# Patient Record
Sex: Male | Born: 1935 | Race: White | Hispanic: No | Smoking: Never smoker
Health system: Southern US, Community
[De-identification: ages and names within clinical notes are randomized; demographics above are authoritative.]

## PROBLEM LIST (undated history)

## (undated) DIAGNOSIS — I2510 Atherosclerotic heart disease of native coronary artery without angina pectoris: Secondary | ICD-10-CM

## (undated) DIAGNOSIS — N400 Enlarged prostate without lower urinary tract symptoms: Secondary | ICD-10-CM

## (undated) DIAGNOSIS — B9620 Unspecified Escherichia coli [E. coli] as the cause of diseases classified elsewhere: Secondary | ICD-10-CM

## (undated) DIAGNOSIS — G454 Transient global amnesia: Secondary | ICD-10-CM

## (undated) DIAGNOSIS — R0600 Dyspnea, unspecified: Secondary | ICD-10-CM

## (undated) DIAGNOSIS — E785 Hyperlipidemia, unspecified: Secondary | ICD-10-CM

## (undated) DIAGNOSIS — R972 Elevated prostate specific antigen [PSA]: Secondary | ICD-10-CM

## (undated) DIAGNOSIS — H9192 Unspecified hearing loss, left ear: Secondary | ICD-10-CM

## (undated) DIAGNOSIS — Z8719 Personal history of other diseases of the digestive system: Secondary | ICD-10-CM

## (undated) DIAGNOSIS — R7881 Bacteremia: Secondary | ICD-10-CM

## (undated) DIAGNOSIS — I10 Essential (primary) hypertension: Secondary | ICD-10-CM

## (undated) DIAGNOSIS — Z8639 Personal history of other endocrine, nutritional and metabolic disease: Secondary | ICD-10-CM

## (undated) DIAGNOSIS — N289 Disorder of kidney and ureter, unspecified: Secondary | ICD-10-CM

## (undated) DIAGNOSIS — N12 Tubulo-interstitial nephritis, not specified as acute or chronic: Secondary | ICD-10-CM

## (undated) DIAGNOSIS — Z8673 Personal history of transient ischemic attack (TIA), and cerebral infarction without residual deficits: Secondary | ICD-10-CM

## (undated) HISTORY — DX: Transient global amnesia: G45.4

## (undated) HISTORY — DX: Tubulo-interstitial nephritis, not specified as acute or chronic: N12

## (undated) HISTORY — DX: Elevated prostate specific antigen (PSA): R97.20

## (undated) HISTORY — PX: COLONOSCOPY: SHX174

## (undated) HISTORY — DX: Unspecified hearing loss, left ear: H91.92

## (undated) HISTORY — DX: Hyperlipidemia, unspecified: E78.5

## (undated) HISTORY — DX: Personal history of other endocrine, nutritional and metabolic disease: Z86.39

## (undated) HISTORY — PX: CHOLECYSTECTOMY: SHX55

## (undated) HISTORY — PX: STAPEDES SURGERY: SHX789

## (undated) HISTORY — DX: Essential (primary) hypertension: I10

## (undated) HISTORY — DX: Personal history of transient ischemic attack (TIA), and cerebral infarction without residual deficits: Z86.73

## (undated) HISTORY — DX: Benign prostatic hyperplasia without lower urinary tract symptoms: N40.0

## (undated) HISTORY — DX: Atherosclerotic heart disease of native coronary artery without angina pectoris: I25.10

## (undated) HISTORY — PX: OTHER SURGICAL HISTORY: SHX169

## (undated) HISTORY — DX: Unspecified Escherichia coli (E. coli) as the cause of diseases classified elsewhere: B96.20

## (undated) HISTORY — PX: TRANSURETHRAL RESECTION OF PROSTATE: SHX73

## (undated) HISTORY — DX: Disorder of kidney and ureter, unspecified: N28.9

## (undated) HISTORY — DX: Bacteremia: R78.81

## (undated) HISTORY — DX: Personal history of other diseases of the digestive system: Z87.19

---

## 2004-02-23 DIAGNOSIS — Z8639 Personal history of other endocrine, nutritional and metabolic disease: Secondary | ICD-10-CM

## 2004-02-23 HISTORY — DX: Personal history of other endocrine, nutritional and metabolic disease: Z86.39

## 2004-02-26 ENCOUNTER — Inpatient Hospital Stay (HOSPITAL_COMMUNITY): Admission: EM | Admit: 2004-02-26 | Discharge: 2004-02-27 | Payer: Self-pay | Admitting: Emergency Medicine

## 2004-11-30 ENCOUNTER — Encounter (HOSPITAL_COMMUNITY): Admission: RE | Admit: 2004-11-30 | Discharge: 2005-02-28 | Payer: Self-pay | Admitting: Endocrinology

## 2005-03-11 ENCOUNTER — Ambulatory Visit: Payer: Self-pay | Admitting: Gastroenterology

## 2005-03-25 ENCOUNTER — Ambulatory Visit: Payer: Self-pay | Admitting: Gastroenterology

## 2006-06-09 ENCOUNTER — Ambulatory Visit: Payer: Self-pay | Admitting: Internal Medicine

## 2006-06-17 ENCOUNTER — Ambulatory Visit: Payer: Self-pay

## 2006-10-13 ENCOUNTER — Encounter: Payer: Self-pay | Admitting: Internal Medicine

## 2006-10-13 DIAGNOSIS — Z8719 Personal history of other diseases of the digestive system: Secondary | ICD-10-CM | POA: Insufficient documentation

## 2006-10-13 DIAGNOSIS — D649 Anemia, unspecified: Secondary | ICD-10-CM | POA: Insufficient documentation

## 2006-10-13 DIAGNOSIS — I10 Essential (primary) hypertension: Secondary | ICD-10-CM

## 2006-10-13 DIAGNOSIS — I129 Hypertensive chronic kidney disease with stage 1 through stage 4 chronic kidney disease, or unspecified chronic kidney disease: Secondary | ICD-10-CM | POA: Insufficient documentation

## 2006-10-13 DIAGNOSIS — G459 Transient cerebral ischemic attack, unspecified: Secondary | ICD-10-CM

## 2006-10-13 DIAGNOSIS — K5732 Diverticulitis of large intestine without perforation or abscess without bleeding: Secondary | ICD-10-CM

## 2006-10-13 HISTORY — DX: Personal history of other diseases of the digestive system: Z87.19

## 2006-10-13 HISTORY — DX: Transient cerebral ischemic attack, unspecified: G45.9

## 2006-10-13 HISTORY — DX: Diverticulitis of large intestine without perforation or abscess without bleeding: K57.32

## 2007-03-03 ENCOUNTER — Ambulatory Visit: Payer: Self-pay | Admitting: Internal Medicine

## 2007-03-03 DIAGNOSIS — E039 Hypothyroidism, unspecified: Secondary | ICD-10-CM

## 2007-03-03 DIAGNOSIS — R413 Other amnesia: Secondary | ICD-10-CM | POA: Insufficient documentation

## 2007-03-03 HISTORY — DX: Other amnesia: R41.3

## 2007-03-03 HISTORY — DX: Hypothyroidism, unspecified: E03.9

## 2007-03-29 ENCOUNTER — Ambulatory Visit: Payer: Self-pay | Admitting: Internal Medicine

## 2007-03-29 DIAGNOSIS — R1011 Right upper quadrant pain: Secondary | ICD-10-CM

## 2007-03-29 LAB — CONVERTED CEMR LAB
ALT: 25 units/L (ref 0–53)
AST: 19 units/L (ref 0–37)
Albumin: 3.5 g/dL (ref 3.5–5.2)
Alkaline Phosphatase: 70 units/L (ref 39–117)
Amylase: 148 units/L — ABNORMAL HIGH (ref 27–131)
BUN: 18 mg/dL (ref 6–23)
Basophils Absolute: 0 10*3/uL (ref 0.0–0.1)
Basophils Relative: 0 % (ref 0.0–1.0)
Bilirubin, Direct: 0.2 mg/dL (ref 0.0–0.3)
CO2: 32 meq/L (ref 19–32)
Calcium: 10 mg/dL (ref 8.4–10.5)
Chloride: 104 meq/L (ref 96–112)
Creatinine, Ser: 1.4 mg/dL (ref 0.4–1.5)
Eosinophils Absolute: 0.4 10*3/uL (ref 0.0–0.6)
Eosinophils Relative: 3.5 % (ref 0.0–5.0)
GFR calc Af Amer: 65 mL/min
GFR calc non Af Amer: 53 mL/min
Glucose, Bld: 84 mg/dL (ref 70–99)
H Pylori IgG: NEGATIVE
HCT: 45.3 % (ref 39.0–52.0)
Hemoglobin: 15.3 g/dL (ref 13.0–17.0)
Lipase: 58 units/L (ref 11.0–59.0)
Lymphocytes Relative: 18.5 % (ref 12.0–46.0)
MCHC: 33.9 g/dL (ref 30.0–36.0)
MCV: 85.6 fL (ref 78.0–100.0)
Monocytes Absolute: 1.1 10*3/uL — ABNORMAL HIGH (ref 0.2–0.7)
Monocytes Relative: 9.5 % (ref 3.0–11.0)
Neutro Abs: 7.6 10*3/uL (ref 1.4–7.7)
Neutrophils Relative %: 68.5 % (ref 43.0–77.0)
Platelets: 242 10*3/uL (ref 150–400)
Potassium: 4.4 meq/L (ref 3.5–5.1)
RBC: 5.29 M/uL (ref 4.22–5.81)
RDW: 12.1 % (ref 11.5–14.6)
Sodium: 143 meq/L (ref 135–145)
Total Bilirubin: 1 mg/dL (ref 0.3–1.2)
Total Protein: 7.2 g/dL (ref 6.0–8.3)
WBC: 11.2 10*3/uL — ABNORMAL HIGH (ref 4.5–10.5)

## 2007-03-29 LAB — HM COLONOSCOPY

## 2007-04-04 ENCOUNTER — Encounter: Admission: RE | Admit: 2007-04-04 | Discharge: 2007-04-04 | Payer: Self-pay | Admitting: Internal Medicine

## 2007-06-20 ENCOUNTER — Ambulatory Visit: Payer: Self-pay

## 2007-07-11 ENCOUNTER — Ambulatory Visit: Payer: Self-pay | Admitting: Internal Medicine

## 2007-07-11 LAB — CONVERTED CEMR LAB
ALT: 28 units/L (ref 0–53)
AST: 21 units/L (ref 0–37)
Albumin: 3.7 g/dL (ref 3.5–5.2)
Alkaline Phosphatase: 54 units/L (ref 39–117)
BUN: 14 mg/dL (ref 6–23)
Bilirubin, Direct: 0.1 mg/dL (ref 0.0–0.3)
CO2: 32 meq/L (ref 19–32)
Calcium: 9.8 mg/dL (ref 8.4–10.5)
Chloride: 106 meq/L (ref 96–112)
Cholesterol: 234 mg/dL (ref 0–200)
Creatinine, Ser: 1.3 mg/dL (ref 0.4–1.5)
Direct LDL: 129.3 mg/dL
GFR calc Af Amer: 70 mL/min
GFR calc non Af Amer: 58 mL/min
Glucose, Bld: 87 mg/dL (ref 70–99)
HDL: 30.1 mg/dL — ABNORMAL LOW (ref >39.0)
Potassium: 4.4 meq/L (ref 3.5–5.1)
Sodium: 144 meq/L (ref 135–145)
Total Bilirubin: 0.9 mg/dL (ref 0.3–1.2)
Total CHOL/HDL Ratio: 7.8
Total Protein: 6.8 g/dL (ref 6.0–8.3)
Triglycerides: 301 mg/dL (ref 0–149)
VLDL: 60 mg/dL — ABNORMAL HIGH (ref 0–40)

## 2007-10-05 ENCOUNTER — Telehealth: Payer: Self-pay | Admitting: Internal Medicine

## 2007-10-19 ENCOUNTER — Encounter: Payer: Self-pay | Admitting: Internal Medicine

## 2007-11-16 ENCOUNTER — Ambulatory Visit: Payer: Self-pay | Admitting: Internal Medicine

## 2008-01-09 ENCOUNTER — Ambulatory Visit: Payer: Self-pay | Admitting: Internal Medicine

## 2008-01-09 DIAGNOSIS — R5381 Other malaise: Secondary | ICD-10-CM

## 2008-01-09 DIAGNOSIS — R5383 Other fatigue: Secondary | ICD-10-CM | POA: Insufficient documentation

## 2008-02-13 ENCOUNTER — Encounter: Payer: Self-pay | Admitting: Internal Medicine

## 2008-04-01 ENCOUNTER — Ambulatory Visit: Payer: Self-pay | Admitting: Internal Medicine

## 2008-04-03 LAB — CONVERTED CEMR LAB
ALT: 25 units/L (ref 0–53)
AST: 17 units/L (ref 0–37)
Albumin: 3.7 g/dL (ref 3.5–5.2)
Alkaline Phosphatase: 46 units/L (ref 39–117)
BUN: 18 mg/dL (ref 6–23)
Bilirubin, Direct: 0.1 mg/dL (ref 0.0–0.3)
CO2: 28 meq/L (ref 19–32)
Calcium: 10 mg/dL (ref 8.4–10.5)
Chloride: 104 meq/L (ref 96–112)
Cholesterol: 245 mg/dL (ref 0–200)
Creatinine, Ser: 1.5 mg/dL (ref 0.4–1.5)
Direct LDL: 158.9 mg/dL
GFR calc Af Amer: 60 mL/min
GFR calc non Af Amer: 49 mL/min
Glucose, Bld: 89 mg/dL (ref 70–99)
HDL: 32.5 mg/dL — ABNORMAL LOW (ref >39.0)
Potassium: 4.5 meq/L (ref 3.5–5.1)
Sodium: 140 meq/L (ref 135–145)
Total Bilirubin: 0.9 mg/dL (ref 0.3–1.2)
Total CHOL/HDL Ratio: 7.5
Total Protein: 6.9 g/dL (ref 6.0–8.3)
Triglycerides: 225 mg/dL (ref 0–149)
Uric Acid, Serum: 9.9 mg/dL — ABNORMAL HIGH (ref 4.0–7.8)
VLDL: 45 mg/dL — ABNORMAL HIGH (ref 0–40)

## 2008-04-23 ENCOUNTER — Ambulatory Visit: Payer: Self-pay | Admitting: Internal Medicine

## 2008-07-19 ENCOUNTER — Ambulatory Visit: Payer: Self-pay

## 2008-07-19 ENCOUNTER — Encounter: Payer: Self-pay | Admitting: Internal Medicine

## 2008-08-02 ENCOUNTER — Ambulatory Visit: Payer: Self-pay | Admitting: Internal Medicine

## 2008-08-02 LAB — CONVERTED CEMR LAB
ALT: 25 units/L (ref 0–53)
AST: 21 units/L (ref 0–37)
Albumin: 3.8 g/dL (ref 3.5–5.2)
Alkaline Phosphatase: 65 units/L (ref 39–117)
BUN: 16 mg/dL (ref 6–23)
Bilirubin, Direct: 0.1 mg/dL (ref 0.0–0.3)
CO2: 28 meq/L (ref 19–32)
Calcium: 9.6 mg/dL (ref 8.4–10.5)
Chloride: 111 meq/L (ref 96–112)
Cholesterol: 181 mg/dL (ref 0–200)
Creatinine, Ser: 1.4 mg/dL (ref 0.4–1.5)
GFR calc non Af Amer: 53.1 mL/min (ref >60)
Glucose, Bld: 83 mg/dL (ref 70–99)
HDL: 35.2 mg/dL — ABNORMAL LOW (ref >39.00)
LDL Cholesterol: 122 mg/dL — ABNORMAL HIGH (ref 0–99)
Potassium: 4.4 meq/L (ref 3.5–5.1)
Sodium: 141 meq/L (ref 135–145)
TSH: 1.49 microintl units/mL (ref 0.35–5.50)
Total Bilirubin: 1.1 mg/dL (ref 0.3–1.2)
Total CHOL/HDL Ratio: 5
Total Protein: 6.9 g/dL (ref 6.0–8.3)
Triglycerides: 119 mg/dL (ref 0.0–149.0)
VLDL: 23.8 mg/dL (ref 0.0–40.0)

## 2008-08-05 ENCOUNTER — Ambulatory Visit: Payer: Self-pay | Admitting: Internal Medicine

## 2008-10-05 ENCOUNTER — Ambulatory Visit: Payer: Self-pay | Admitting: Family Medicine

## 2008-11-01 ENCOUNTER — Ambulatory Visit: Payer: Self-pay | Admitting: Internal Medicine

## 2008-11-01 LAB — CONVERTED CEMR LAB
BUN: 20 mg/dL (ref 6–23)
CO2: 29 meq/L (ref 19–32)
Calcium: 9.6 mg/dL (ref 8.4–10.5)
Chloride: 106 meq/L (ref 96–112)
Creatinine, Ser: 1.3 mg/dL (ref 0.4–1.5)
GFR calc non Af Amer: 57.8 mL/min (ref >60)
Glucose, Bld: 88 mg/dL (ref 70–99)
PSA: 2.33 ng/mL (ref 0.10–4.00)
Potassium: 4.7 meq/L (ref 3.5–5.1)
Sed Rate: 16 mm/hr (ref 0–22)
Sodium: 142 meq/L (ref 135–145)
Uric Acid, Serum: 9.4 mg/dL — ABNORMAL HIGH (ref 4.0–7.8)

## 2008-11-04 ENCOUNTER — Ambulatory Visit: Payer: Self-pay | Admitting: Internal Medicine

## 2009-02-03 ENCOUNTER — Ambulatory Visit: Payer: Self-pay | Admitting: Internal Medicine

## 2009-02-03 LAB — CONVERTED CEMR LAB
ALT: 27 units/L (ref 0–53)
AST: 22 units/L (ref 0–37)
Albumin: 3.7 g/dL (ref 3.5–5.2)
Alkaline Phosphatase: 71 units/L (ref 39–117)
BUN: 14 mg/dL (ref 6–23)
Bilirubin, Direct: 0.1 mg/dL (ref 0.0–0.3)
CO2: 30 meq/L (ref 19–32)
Calcium: 9.8 mg/dL (ref 8.4–10.5)
Chloride: 108 meq/L (ref 96–112)
Cholesterol: 169 mg/dL (ref 0–200)
Creatinine, Ser: 1.3 mg/dL (ref 0.4–1.5)
GFR calc non Af Amer: 57.76 mL/min (ref >60)
Glucose, Bld: 96 mg/dL (ref 70–99)
HDL: 36.3 mg/dL — ABNORMAL LOW (ref >39.00)
LDL Cholesterol: 109 mg/dL — ABNORMAL HIGH (ref 0–99)
Potassium: 4.7 meq/L (ref 3.5–5.1)
Sodium: 143 meq/L (ref 135–145)
Total Bilirubin: 0.8 mg/dL (ref 0.3–1.2)
Total CHOL/HDL Ratio: 5
Total Protein: 6.8 g/dL (ref 6.0–8.3)
Triglycerides: 121 mg/dL (ref 0.0–149.0)
VLDL: 24.2 mg/dL (ref 0.0–40.0)

## 2009-02-04 ENCOUNTER — Ambulatory Visit: Payer: Self-pay | Admitting: Internal Medicine

## 2009-02-19 ENCOUNTER — Telehealth (INDEPENDENT_AMBULATORY_CARE_PROVIDER_SITE_OTHER): Payer: Self-pay | Admitting: *Deleted

## 2009-02-20 ENCOUNTER — Ambulatory Visit: Payer: Self-pay | Admitting: Cardiovascular Disease

## 2009-02-20 ENCOUNTER — Ambulatory Visit: Payer: Self-pay | Admitting: Internal Medicine

## 2009-02-20 ENCOUNTER — Ambulatory Visit: Payer: Self-pay

## 2009-02-20 ENCOUNTER — Encounter (HOSPITAL_COMMUNITY): Admission: RE | Admit: 2009-02-20 | Discharge: 2009-04-28 | Payer: Self-pay | Admitting: Internal Medicine

## 2009-02-20 ENCOUNTER — Telehealth: Payer: Self-pay | Admitting: Internal Medicine

## 2009-02-20 DIAGNOSIS — R0989 Other specified symptoms and signs involving the circulatory and respiratory systems: Secondary | ICD-10-CM

## 2009-02-20 DIAGNOSIS — R0609 Other forms of dyspnea: Secondary | ICD-10-CM

## 2009-02-22 DIAGNOSIS — N289 Disorder of kidney and ureter, unspecified: Secondary | ICD-10-CM

## 2009-02-22 HISTORY — DX: Disorder of kidney and ureter, unspecified: N28.9

## 2009-02-25 ENCOUNTER — Ambulatory Visit: Payer: Self-pay | Admitting: Internal Medicine

## 2009-02-26 ENCOUNTER — Ambulatory Visit: Payer: Self-pay | Admitting: Cardiology

## 2009-02-26 ENCOUNTER — Inpatient Hospital Stay (HOSPITAL_BASED_OUTPATIENT_CLINIC_OR_DEPARTMENT_OTHER): Admission: RE | Admit: 2009-02-26 | Discharge: 2009-02-26 | Payer: Self-pay | Admitting: Cardiology

## 2009-02-27 LAB — CONVERTED CEMR LAB
BUN: 18 mg/dL (ref 6–23)
Basophils Absolute: 0 10*3/uL (ref 0.0–0.1)
Basophils Relative: 0.1 % (ref 0.0–3.0)
CO2: 28 meq/L (ref 19–32)
Calcium: 9.9 mg/dL (ref 8.4–10.5)
Chloride: 104 meq/L (ref 96–112)
Creatinine, Ser: 1.3 mg/dL (ref 0.4–1.5)
Eosinophils Absolute: 0.4 10*3/uL (ref 0.0–0.7)
Eosinophils Relative: 3.6 % (ref 0.0–5.0)
GFR calc non Af Amer: 57.75 mL/min (ref >60)
Glucose, Bld: 90 mg/dL (ref 70–99)
HCT: 48.4 % (ref 39.0–52.0)
Hemoglobin: 15.6 g/dL (ref 13.0–17.0)
INR: 1 (ref 0.8–1.0)
Lymphocytes Relative: 20.6 % (ref 12.0–46.0)
Lymphs Abs: 2.1 10*3/uL (ref 0.7–4.0)
MCHC: 32.3 g/dL (ref 30.0–36.0)
MCV: 88.3 fL (ref 78.0–100.0)
Monocytes Absolute: 0.7 10*3/uL (ref 0.1–1.0)
Monocytes Relative: 6.9 % (ref 3.0–12.0)
Neutro Abs: 7 10*3/uL (ref 1.4–7.7)
Neutrophils Relative %: 68.8 % (ref 43.0–77.0)
Platelets: 278 10*3/uL (ref 150.0–400.0)
Potassium: 4.6 meq/L (ref 3.5–5.1)
Prothrombin Time: 10.3 s (ref 9.1–11.7)
RBC: 5.48 M/uL (ref 4.22–5.81)
RDW: 12 % (ref 11.5–14.6)
Sodium: 138 meq/L (ref 135–145)
WBC: 10.2 10*3/uL (ref 4.5–10.5)
aPTT: 32.2 s — ABNORMAL HIGH (ref 21.7–28.8)

## 2009-03-10 ENCOUNTER — Encounter: Payer: Self-pay | Admitting: Internal Medicine

## 2009-03-11 ENCOUNTER — Telehealth (INDEPENDENT_AMBULATORY_CARE_PROVIDER_SITE_OTHER): Payer: Self-pay | Admitting: *Deleted

## 2009-03-12 ENCOUNTER — Ambulatory Visit: Payer: Self-pay | Admitting: Cardiovascular Disease

## 2009-03-12 DIAGNOSIS — R9439 Abnormal result of other cardiovascular function study: Secondary | ICD-10-CM

## 2009-05-12 ENCOUNTER — Ambulatory Visit: Payer: Self-pay | Admitting: Internal Medicine

## 2009-05-12 LAB — CONVERTED CEMR LAB
BUN: 19 mg/dL (ref 6–23)
CO2: 29 meq/L (ref 19–32)
Calcium: 9.6 mg/dL (ref 8.4–10.5)
Chloride: 109 meq/L (ref 96–112)
Cholesterol: 168 mg/dL (ref 0–200)
Creatinine, Ser: 1.3 mg/dL (ref 0.4–1.5)
GFR calc non Af Amer: 57.71 mL/min (ref >60)
Glucose, Bld: 90 mg/dL (ref 70–99)
HDL: 38.8 mg/dL — ABNORMAL LOW (ref >39.00)
LDL Cholesterol: 102 mg/dL — ABNORMAL HIGH (ref 0–99)
Potassium: 4.7 meq/L (ref 3.5–5.1)
Sodium: 141 meq/L (ref 135–145)
TSH: 1.92 microintl units/mL (ref 0.35–5.50)
Total CHOL/HDL Ratio: 4
Triglycerides: 136 mg/dL (ref 0.0–149.0)
Uric Acid, Serum: 8.2 mg/dL — ABNORMAL HIGH (ref 4.0–7.8)
VLDL: 27.2 mg/dL (ref 0.0–40.0)

## 2009-05-13 ENCOUNTER — Ambulatory Visit: Payer: Self-pay | Admitting: Internal Medicine

## 2009-06-06 ENCOUNTER — Ambulatory Visit: Payer: Self-pay | Admitting: Internal Medicine

## 2009-06-20 ENCOUNTER — Ambulatory Visit: Payer: Self-pay | Admitting: Internal Medicine

## 2009-06-23 LAB — CONVERTED CEMR LAB
BUN: 17 mg/dL (ref 6–23)
CO2: 23 meq/L (ref 19–32)
Calcium: 9.9 mg/dL (ref 8.4–10.5)
Chloride: 105 meq/L (ref 96–112)
Creatinine, Ser: 1.68 mg/dL — ABNORMAL HIGH (ref 0.40–1.50)
Glucose, Bld: 90 mg/dL (ref 70–99)
Potassium: 4.4 meq/L (ref 3.5–5.3)
Sodium: 140 meq/L (ref 135–145)

## 2009-07-15 ENCOUNTER — Telehealth: Payer: Self-pay | Admitting: Internal Medicine

## 2009-07-16 ENCOUNTER — Ambulatory Visit: Payer: Self-pay | Admitting: Internal Medicine

## 2009-07-16 LAB — CONVERTED CEMR LAB
Bilirubin Urine: NEGATIVE
Hemoglobin, Urine: NEGATIVE
Ketones, ur: NEGATIVE mg/dL
Leukocytes, UA: NEGATIVE
Nitrite: NEGATIVE
Specific Gravity, Urine: >=1.03 (ref 1.000–1.030)
Total Protein, Urine: 100 mg/dL
Urine Glucose: NEGATIVE mg/dL
Urobilinogen, UA: 0.2 (ref 0.0–1.0)
pH: 5.5 (ref 5.0–8.0)

## 2009-07-18 ENCOUNTER — Telehealth: Payer: Self-pay | Admitting: Internal Medicine

## 2009-08-14 ENCOUNTER — Ambulatory Visit: Payer: Self-pay | Admitting: Internal Medicine

## 2009-08-14 DIAGNOSIS — N183 Chronic kidney disease, stage 3 (moderate): Secondary | ICD-10-CM

## 2009-08-14 DIAGNOSIS — N185 Chronic kidney disease, stage 5: Secondary | ICD-10-CM | POA: Insufficient documentation

## 2009-08-14 HISTORY — DX: Chronic kidney disease, stage 5: N18.5

## 2009-08-15 ENCOUNTER — Telehealth: Payer: Self-pay | Admitting: Internal Medicine

## 2009-10-20 ENCOUNTER — Ambulatory Visit: Payer: Self-pay | Admitting: Internal Medicine

## 2009-10-20 LAB — CONVERTED CEMR LAB
Albumin: 3.7 g/dL (ref 3.5–5.2)
Alkaline Phosphatase: 63 units/L (ref 39–117)
BUN: 16 mg/dL (ref 6–23)
CO2: 28 meq/L (ref 19–32)
Calcium: 9.8 mg/dL (ref 8.4–10.5)
Creatinine, Ser: 1.4 mg/dL (ref 0.4–1.5)
GFR calc non Af Amer: 52.92 mL/min (ref >60)
Glucose, Bld: 78 mg/dL (ref 70–99)
Sodium: 141 meq/L (ref 135–145)
TSH: 2.5 microintl units/mL (ref 0.35–5.50)
Total Protein: 6.6 g/dL (ref 6.0–8.3)

## 2009-10-22 ENCOUNTER — Ambulatory Visit: Payer: Self-pay | Admitting: Internal Medicine

## 2009-10-22 DIAGNOSIS — IMO0002 Reserved for concepts with insufficient information to code with codable children: Secondary | ICD-10-CM

## 2009-10-22 DIAGNOSIS — L570 Actinic keratosis: Secondary | ICD-10-CM

## 2009-10-22 DIAGNOSIS — E875 Hyperkalemia: Secondary | ICD-10-CM | POA: Insufficient documentation

## 2009-10-22 HISTORY — DX: Actinic keratosis: L57.0

## 2009-10-22 HISTORY — DX: Hyperkalemia: E87.5

## 2009-11-25 ENCOUNTER — Telehealth: Payer: Self-pay | Admitting: Internal Medicine

## 2009-12-10 ENCOUNTER — Telehealth: Payer: Self-pay | Admitting: Internal Medicine

## 2009-12-10 DIAGNOSIS — H9190 Unspecified hearing loss, unspecified ear: Secondary | ICD-10-CM | POA: Insufficient documentation

## 2009-12-16 ENCOUNTER — Ambulatory Visit: Payer: Self-pay | Admitting: Internal Medicine

## 2009-12-30 ENCOUNTER — Telehealth: Payer: Self-pay | Admitting: Internal Medicine

## 2010-01-20 ENCOUNTER — Telehealth: Payer: Self-pay | Admitting: Internal Medicine

## 2010-01-26 ENCOUNTER — Telehealth: Payer: Self-pay | Admitting: Internal Medicine

## 2010-01-26 ENCOUNTER — Ambulatory Visit: Payer: Self-pay | Admitting: Internal Medicine

## 2010-01-26 LAB — CONVERTED CEMR LAB
BUN: 16 mg/dL (ref 6–23)
CO2: 28 meq/L (ref 19–32)
Calcium: 9.8 mg/dL (ref 8.4–10.5)
Chloride: 105 meq/L (ref 96–112)
Creatinine, Ser: 1.2 mg/dL (ref 0.4–1.5)
GFR calc non Af Amer: 61.4 mL/min (ref >60)
Glucose, Bld: 86 mg/dL (ref 70–99)
PSA: 3.16 ng/mL (ref 0.10–4.00)
Potassium: 4.6 meq/L (ref 3.5–5.1)
Sodium: 139 meq/L (ref 135–145)
TSH: 2 microintl units/mL (ref 0.35–5.50)

## 2010-01-28 ENCOUNTER — Ambulatory Visit: Payer: Self-pay | Admitting: Internal Medicine

## 2010-01-28 DIAGNOSIS — R519 Headache, unspecified: Secondary | ICD-10-CM | POA: Insufficient documentation

## 2010-01-28 DIAGNOSIS — R51 Headache: Secondary | ICD-10-CM

## 2010-02-09 ENCOUNTER — Encounter: Payer: Self-pay | Admitting: Internal Medicine

## 2010-02-27 ENCOUNTER — Telehealth: Payer: Self-pay | Admitting: Internal Medicine

## 2010-03-26 NOTE — Progress Notes (Signed)
Summary: ARICEPT  Phone Note Call from Patient Call back at Home Phone [phone]   Summary of Call: Pt continues to have diarrhea while taking aricept. He wants to know if it is ok to try aricept 5mg . If yes, he would like samples if possible.  Initial call taken by: Charlsie Quest, [place],  March 11, 2009 6:21 PM  Follow-up for Phone Call        OK to try. OK samples if we have 5 mg is generic now Follow-up by: Cassandria Anger MD,  March 12, 2009 1:45 PM  Additional Follow-up for Phone Call Additional follow up Details #1::        Placed samples up front, left message on machine to call back to office. Additional Follow-up by: Ernestene Mention,  March 12, 2009 1:49 PM    Additional Follow-up for Phone Call Additional follow up Details #2::    Informed pt. Follow-up by: Gardenia Phlegm CMA,  March 13, 2009 9:19 AM

## 2010-03-26 NOTE — Progress Notes (Signed)
Summary: uti? / RESULTS READY  Phone Note Call from Patient   Caller: pt  Summary of Call: Pt called and stated that he has urinary freq and lower abd pain. He thinks he has a Urinary Tract infection. Do you want pt to come in for UA or what do you advise? Initial call taken by: [place],  Jul 15, 2009 4:05 PM  Follow-up for Phone Call        Get a UA pls OV if sick or if needed Follow-up by: Cassandria Anger MD,  Jul 15, 2009 5:57 PM  Additional Follow-up for Phone Call Additional follow up Details #1::        attempted call, pt picked up cell then call was dropped. Order in idx..............Marland KitchenCharlsie Quest, CMA  Jul 16, 2009 8:16 AM   Pt informed, hold phone note for results.  Additional Follow-up by: Charlsie Quest, [place],  Jul 16, 2009 11:09 AM    Additional Follow-up for Phone Call Additional follow up Details #2::    Please review results ready in EMR. They were put under Dr Linda Hedges by accident................Marland KitchenCharlsie Quest, CMA  Jul 16, 2009 3:36 PM  UA was nl. It could be prostatitis however. Has he had prostatitis before? We can try Cipro OV if not well Follow-up by: Cassandria Anger MD,  Jul 16, 2009 5:08 PM  Additional Follow-up for Phone Call Additional follow up Details #3:: Details for Additional Follow-up Action Taken: left mess to call office back...........Marland KitchenCharlsie Quest, CMA  Jul 16, 2009 5:55 PM   Pt informed  Additional Follow-up by: Charlsie Quest, [place],  Jul 17, 2009 3:23 PM  New/Updated Medications: CIPROFLOXACIN HCL 500 MG TABS (CIPROFLOXACIN HCL) 1 by mouth bid Prescriptions: CIPROFLOXACIN HCL 500 MG TABS (CIPROFLOXACIN HCL) 1 by mouth bid  #28 x 1   Entered and Authorized by:   Cassandria Anger MD   Signed by:   Charlsie Quest, CMA on 07/16/2009   Method used:   Electronically to        [facility]. [phone]* (retail)       [address]       [place], [place]  [address]       Ph: [phone] or [phone]       Fax:  [phone]   RxID:   [id number]

## 2010-03-26 NOTE — Assessment & Plan Note (Signed)
Summary: eph/ post cath./ gd   Visit Type:  Follow-up  CC:  no complaints.  History of Present Illness: This is a 75 year old white male patient, who had PVCs and 46 beats of wide complex tachycardia while doing a stress Myoview. There was minimal abnormality on the scan and cardiac catheter was recommended. This was done February 26, 2009 by Dr. Eustace Quail via right radial artery. This revealed normal coronary arteries except for a 40% narrowing and plaque of the LAD after the first diagonal with irregular circumflex and RCA normal LV function. Dr. Maurene Capes recommended reassurance and followup with Dr. Caryl Comes.  The patient has done well. He denies palpitations, since he's been taking metoprolol. He did have a lot of pain in his right arm after the procedure, mostly aching at night. He denied fever, swelling, bruising, redness, or signs of infection. He does much better today. He denies chest pain, palpitations, dyspnea, dyspnea on exertion, dizziness, or presyncope.  Current Medications (verified): 1)  Adult Aspirin Low Strength 81 Mg  Tbdp (Aspirin) .... Once Daily 2)  Synthroid 50 Mcg Tabs (Levothyroxine Sodium) .... Once Daily 3)  Vitamin D3 1000 Unit  Tabs (Cholecalciferol) .Marland Kitchen.. 1 Qd 4)  Simvastatin 40 Mg Tabs (Simvastatin) .... Take One Tablet At Bedtime 5)  Amlodipine Besy-Benazepril Hcl 10-20 Mg Caps (Amlodipine Besy-Benazepril Hcl) .Marland Kitchen.. 1 By Mouth Once Daily For Blood Pressure 6)  Metoprolol Tartrate 50 Mg Tabs (Metoprolol Tartrate) .... Two Times A Day  Allergies: 1)  Hydrochlorothiazide  Past History:  Past Medical History: Last updated: 11/04/2008 Diverticulitis, hx of Hyperlipidemia Hypertension H/o hyperthyroidism, s/p 131I 2006 Dr Chalmers Cater, now hypothyroid Pancreatitis, hx of Transient ischemic attack, hx of Benign prostatic hypertrophy Anemia- TRASIENT GLOBAL SEVERAL Hypothyroidism hx of elevated PSA Dr ? Burman Freestone. 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 Neck: No JVD, HJR, Bruit, or thyroid enlargement Lungs: No tachypnea, clear without wheezing, rales, or rhonchi Cardiovascular: RRR, PMI not displaced, heart sounds normal, no murmurs, gallops, bruit, thrill, or heave. Abdomen: BS normal. Soft without organomegaly, masses, lesions or tenderness. Extremities: right radial artery is palpable without bruit. No evidence of hematoma or hemorrhage. without cyanosis, clubbing or edema. Good distal pulses bilateral SKin: Warm, no lesions or rashes  Musculoskeletal: No deformities Neuro: no focal signs    Impression & Recommendations:  Problem # 1:  VENTRICULAR TACHYCARDIA NONSUSTAINED ON TREADMILL TESTING (ICD-427.1) Patient had nonsustained V. tach on stress Myoview. He is currently on beta blocker and doing well. We will continue this and follow up with Dr. Caryl Comes His updated medication list for this problem includes:    Adult Aspirin Low Strength 81 Mg Tbdp (Aspirin) ..... Once daily    Amlodipine Besy-benazepril Hcl 10-20 Mg Caps (Amlodipine besy-benazepril hcl) .Marland Kitchen... 1 by mouth once daily for blood pressure    Metoprolol Tartrate 50 Mg Tabs (Metoprolol tartrate) .Marland Kitchen..Marland Kitchen Two times a day  Problem # 2:  ABNORMAL CV (STRESS)  TEST (ICD-794.39) Patient had abnormal stress test that resulted in cardiac catheterization, which showed minimal nonobstructive coronary disease with 40% narrowing in the proximal LAD and the irregularities in the circumflex and RCA with normal LV function. Recommend reassurance.  Problem # 3:  HYPERTENSION  (ICD-401.9) Blood pressure is much better on the metoprolol His updated medication list for this problem includes:    Adult Aspirin Low Strength 81 Mg Tbdp (Aspirin) ..... Once daily    Amlodipine Besy-benazepril Hcl 10-20 Mg Caps (Amlodipine besy-benazepril hcl) .Marland Kitchen... 1 by mouth once daily for blood pressure    Metoprolol Tartrate 50 Mg Tabs (Metoprolol tartrate) .Marland Kitchen..Marland Kitchen Two times a day  Patient Instructions: 1)  Your physician recommends that you schedule a follow-up appointment in: 2 to 3 months with Dr. Caryl Comes

## 2010-03-26 NOTE — Miscellaneous (Signed)
  Clinical Lists Changes  Observations: Added new observation of CARDCATHFIND: CONCLUSION:  Minimal nonobstructive coronary artery disease with 40% narrowing in the proximal left anterior descending and irregularities in the circumflex and right coronary artery and normal left ventricular function.   RECOMMENDATIONS:  Reassurance.  We will arrange followup with Dr. Caryl Comes.     (02/26/2009 11:41)      Cardiac Cath  Procedure date:  02/26/2009  Findings:      CONCLUSION:  Minimal nonobstructive coronary artery disease with 40% narrowing in the proximal left anterior descending and irregularities in the circumflex and right coronary artery and normal left ventricular function.   RECOMMENDATIONS:  Reassurance.  We will arrange followup with Dr. Caryl Comes.

## 2010-03-26 NOTE — Progress Notes (Signed)
Summary: Hearing test referral  Phone Note Call from Patient   Summary of Call: pt called requesting referral for a hearing test.  He states something is wrong with hearing aid.  He sees Dr. Jeani Hawking Pahel.  Pt is aware that Dr. Alain Marion is out of office until 12-15-09. Initial call taken by: Jonathon Resides, [facility]),  December 10, 2009 10:48 AM  Follow-up for Phone Call        ok Follow-up by: Cassandria Anger MD,  December 14, 2009 11:00 AM  New Problems: HEARING LOSS, MILD (ICD-389.9)   New Problems: HEARING LOSS, MILD (ICD-389.9)

## 2010-03-26 NOTE — Assessment & Plan Note (Signed)
Summary: per check out/sf   Visit Type:  Follow-up  CC:  fatigued and dizziness.  History of Present Illness: Mr Laske is seen in followup for  PVCs and 46 beats of wide complex tachycardia while doing a stress Myoview. There was minimal abnormality on the scan and cardiac catheter was recommended. This was done February 26, 2009 by Dr. Eustace Quail via right radial artery. This revealed normal coronary arteries except for a 40% narrowing and plaque of the LAD after the first diagonal with irregular circumflex and RCA normal LV function. Dr. Maurene Capes recommended reassurance and followup with Dr. Caryl Comes.  We tried a trial of various beta blockers including inderal, atenolol and lopressor as well as verapamil.  and  it is the patients main complaint that he is weak and fatigues and lightheaded.  He has also noted that his blood pressure has been more elevated.    His HR has also been higher since a fall at home tripping over a box.  There was a transient frontal headache, but htat has fully resolved.       Problems Prior to Update: 1)  Hearing Loss, Mild  (ICD-389.9) 2)  Actinic Keratosis  (ICD-702.0) 3)  Abscess, Axilla  (ICD-682.3) 4)  Memory Loss  (ICD-780.93) 5)  Hyperkalemia  (ICD-276.7) 6)  Renal Insufficiency  (ICD-588.9) 7)  Coronary Artery Disease Non Obstructive  (ICD-414.00) 8)  Abnormal Cv (STRESS) Test  (ICD-794.39) 9)  Dyspnea On Exertion  (ICD-786.09) 10)  Ventricular Tachycardia Nonsustained On Treadmill Testing  (ICD-427.1) 11)  Fatigue  (ICD-780.79) 12)  Gout, Unspecified  (ICD-274.9) 13)  Abdominal Pain Right Upper Quadrant  (ICD-789.01) 14)  Memory Loss  (ICD-780.93) 15)  Hypothyroidism  (ICD-244.9) 16)  Anemia-nos  (ICD-285.9) 17)  Benign Prostatic Hypertrophy  (ICD-600.00) 18)  Transient Ischemic Attack, Hx of  (ICD-V12.50) 19)  Pancreatitis, Hx of  (ICD-V12.70) 20)  Hypertension  (ICD-401.9) 21)  Hyperlipidemia  (ICD-272.4) 22)  Diverticulitis, Hx of   (ICD-V12.79)  Current Medications (verified): 1)  Adult Aspirin Low Strength 81 Mg  Tbdp (Aspirin) .... Once Daily 2)  Synthroid 50 Mcg Tabs (Levothyroxine Sodium) .... Once Daily 3)  Vitamin D3 1000 Unit  Tabs (Cholecalciferol) .... 2 Qd 4)  Simvastatin 40 Mg Tabs (Simvastatin) .... Take One Tablet At Bedtime 5)  Vitamin B-12 1000 Mcg Tabs (Cyanocobalamin) .... Once Daily 6)  Benazepril Hcl 20 Mg Tabs (Benazepril Hcl) .... Take 1 Tablet By Mouth Once A Day 7)  Furosemide 20 Mg Tabs (Furosemide) .... Every Other Day 8)  Atenolol 50 Mg Tabs (Atenolol) .... 1/2 By Mouth Bid 9)  Galantamine Hydrobromide 4 Mg Tabs (Galantamine Hydrobromide) .Marland Kitchen.. 1 By Mouth Two Times A Day For Memory  Allergies (verified): 1)  Hydrochlorothiazide 2)  Aricept  Past History:  Past Medical History: Last updated: 08/14/2009 Diverticulitis, hx of Hyperlipidemia Hypertension H/o hyperthyroidism, s/p 131I 2006 Dr Chalmers Cater, now hypothyroid Pancreatitis, hx of Transient ischemic attack, hx of Benign prostatic hypertrophy Anemia- TRASIENT GLOBAL SEVERAL Hypothyroidism hx of elevated PSA Dr ? Burman Freestone. 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 hearing los/ wears right hearing aid Gout 2009 Coronary artery disease mild non-obstructive - cath 2011 Renal insufficiency 2011  Past Surgical History: Last updated: 03/29/2007 Cholecystectomy  Family History: Last updated: 03/29/2007 Family History Hypertension mother and father died elderly of natural causes apparently  Social History: Last updated: 03/29/2007 Retired Married Never Smoked Alcohol use-no  Risk Factors: Smoking Status: never (05/13/2009)  Vital Signs:  Patient profile:  75 year old male Height:      74 inches Weight:      209.50 pounds BMI:     27.00 Pulse rate:   67 / minute BP sitting:   186 / 90  (left arm) Cuff size:   regular  Vitals Entered By: Hansel Feinstein CMA (December 16, 2009 4:12 PM)  Physical Exam  General:  The  patient was alert and oriented in no acute distress. HEENT Normal.  Neck veins were flat, carotids were brisk.  Lungs were clear.  Heart sounds were iregular without murmurs or gallops.  Abdomen was soft with active bowel sounds. There is no clubbing cyanosis or edema. Skin Warm and dry    EKG  Procedure date:  12/16/2009  Findings:      sinus at 69 interveal 16/.20/.443 superior axis pvc  Impression & Recommendations:  Problem # 1:  FATIGUE (ICD-780.79) Mr. Raenette Rover complains of fatigue. I wonder whether it is related to his beta blockers. He thinks it is worse over recent months. I note that his other problem was from a long time ago. We will plan to discontinue his atenolol. We'll see how he does. I note also that his blood pressure significant elevated. I am concerned about the impact of discontinuing his beta blocker in this situation. Because of that we will resume amlodipine which had been part of his regime last January.  we spent more than 35 minutes discussing the fatigue and his blood pressure.  Problem # 2:  VENTRICULAR TACHYCARDIA NONSUSTAINED ON TREADMILL TESTING (ICD-427.1) the patient has some palpitations. There has been no significant lightheadedness. The following medications were removed from the medication list:    Atenolol 50 Mg Tabs (Atenolol) .Marland Kitchen... 1/2 by mouth bid His updated medication list for this problem includes:    Adult Aspirin Low Strength 81 Mg Tbdp (Aspirin) ..... Once daily    Benazepril Hcl 20 Mg Tabs (Benazepril hcl) .Marland Kitchen... Take 1 tablet by mouth once a day    Amlodipine Besylate 10 Mg Tabs (Amlodipine besylate) ..... Once daily  Problem # 3:  RENAL INSUFFICIENCY (ICD-588.9) repeat creatinine last month was normal  Problem # 4:  HYPERTENSION (ICD-401.9) his hypertension is quite severe at 185 today. We will discontinue the atenolol as noted at amlodipine. He is to follow his blood pressures and followup with Dr. Alain Marion The following  medications were removed from the medication list:    Atenolol 50 Mg Tabs (Atenolol) .Marland Kitchen... 1/2 by mouth bid His updated medication list for this problem includes:    Adult Aspirin Low Strength 81 Mg Tbdp (Aspirin) ..... Once daily    Benazepril Hcl 20 Mg Tabs (Benazepril hcl) .Marland Kitchen... Take 1 tablet by mouth once a day    Furosemide 20 Mg Tabs (Furosemide) ..... Every other day    Amlodipine Besylate 10 Mg Tabs (Amlodipine besylate) ..... Once daily  Patient Instructions: 1)  Your physician has recommended you make the following change in your medication: Atentolol-take 1/2 pill a day for 5 days then stop medication.  2)  Simvastatin-take one half tablet at bedtime.  3)  Amlodipine 10mg  daily. 4)  Call us in 2 weeks to let us know how your blood pressure is.  Prescriptions: AMLODIPINE BESYLATE 10 MG TABS (AMLODIPINE BESYLATE) once daily  #30 x 11   Entered by:   Joan Flores RN   Authorized by:   Nikki Dom, MD, [facility]   Signed by:   Joan Flores RN on 12/16/2009   Method  used:   Electronically to        [facility]  [address] [phone]* (retail)       [address]       [place], [place]  [address]       Ph: [phone] or [phone]       Fax: [phone]   RxID:   [id number]

## 2010-03-26 NOTE — Assessment & Plan Note (Signed)
Summary: 3 MO ROV /NWS   Vital Signs:  Patient profile:   75 year old male Height:      74 inches Weight:      205 pounds BMI:     26.42 O2 Sat:      97 % on Room air Temp:     97.4 degrees F oral Pulse rate:   50 / minute Resp:     16 per minute BP sitting:   138 / 70  (left arm) Cuff size:   regular  Vitals Entered By: Jonathon Resides, CMA(AAMA) (August 14, 2009 8:05 AM)  O2 Flow:  Room air CC: 3 mo f/u Comments pt states is not using Exelon patch   CC:  3 mo f/u.  History of Present Illness: The patient presents for a follow up of hypertension, memory issues, hyperlipidemia   Current Medications (verified): 1)  Adult Aspirin Low Strength 81 Mg  Tbdp (Aspirin) .... Once Daily 2)  Synthroid 50 Mcg Tabs (Levothyroxine Sodium) .... Once Daily 3)  Vitamin D3 1000 Unit  Tabs (Cholecalciferol) .... 2 Qd 4)  Simvastatin 40 Mg Tabs (Simvastatin) .... Take One Tablet At Bedtime 5)  Vitamin B-12 1000 Mcg Tabs (Cyanocobalamin) .... Once Daily 6)  Exelon 9.5 Mg/24hr Pt24 (Rivastigmine) .... Change Once Daily (For Memory) 7)  Benazepril Hcl 20 Mg Tabs (Benazepril Hcl) .... Take 1 Tablet By Mouth Once A Day 8)  Furosemide 20 Mg Tabs (Furosemide) .... Take One Tablet By Mouth Daily. 9)  Atenolol 50 Mg Tabs (Atenolol) .... Take One Tablet By Mouth Daily  Allergies (verified): 1)  Hydrochlorothiazide 2)  Aricept  Past History:  Social History: Last updated: 03/29/2007 Retired Married Never Smoked Alcohol use-no  Past Medical History: Diverticulitis, hx of Hyperlipidemia Hypertension H/o hyperthyroidism, s/p 131I 2006 Dr Chalmers Cater, now hypothyroid Pancreatitis, hx of Transient ischemic attack, hx of Benign prostatic hypertrophy Anemia- TRASIENT GLOBAL SEVERAL Hypothyroidism hx of elevated PSA Dr ? Burman Freestone. 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    Physical Exam  General:  Well-developed,well-nourished,in no acute distress; alert,appropriate and cooperative throughout examination Nose:  no external deformity.   Mouth:  Oral mucosa and oropharynx without lesions or exudates.  Teeth in good repair. Lungs:  Normal respiratory effort, chest expands symmetrically. Lungs are clear to auscultation, no crackles or wheezes. Heart:  Normal rate and regular rhythm. S1 and S2 normal without gallop, murmur, click, rub or other extra sounds. Abdomen:  Bowel sounds positive,abdomen soft and non-tender without masses, organomegaly or hernias noted. Msk:  No deformity or scoliosis noted of thoracic or lumbar spine.   Neurologic:  No cranial nerve deficits noted. Station and gait are normal. Plantar reflexes are down-going bilaterally. DTRs are symmetrical throughout. Sensory, motor and coordinative functions appear intact. Skin:  Intact without suspicious lesions or rashes Psych:  Cognition and judgment appear intact. Alert and cooperative with normal attention span and concentration. No apparent delusions, illusions, hallucinations   Impression & Recommendations:  Problem # 1:  HYPOTHYROIDISM (ICD-244.9) Assessment Unchanged  His updated medication list for this problem includes:    Synthroid 50 Mcg Tabs (Levothyroxine sodium) ..... Once daily  Problem # 2:  FATIGUE (ICD-780.79) Assessment: Unchanged  Problem # 3:  MEMORY LOSS (ICD-780.93) Assessment: Unchanged Start Galantimine D/c patch -  poor compliance  Problem # 4:  HYPERLIPIDEMIA (ICD-272.4) Assessment: Comment Only  His updated medication list for this problem includes:    Simvastatin 40 Mg Tabs (Simvastatin) .Marland Kitchen... Take one tablet at bedtime  Complete Medication List: 1)  Adult Aspirin Low Strength 81 Mg Tbdp (Aspirin) .... Once daily 2)  Synthroid 50 Mcg Tabs  (Levothyroxine sodium) .... Once daily 3)  Vitamin D3 1000 Unit Tabs (Cholecalciferol) .... 2 qd 4)  Simvastatin 40 Mg Tabs (Simvastatin) .... Take one tablet at bedtime 5)  Vitamin B-12 1000 Mcg Tabs (Cyanocobalamin) .... Once daily 6)  Benazepril Hcl 20 Mg Tabs (Benazepril hcl) .... Take 1 tablet by mouth once a day 7)  Furosemide 20 Mg Tabs (Furosemide) .... Take one tablet by mouth daily. 8)  Atenolol 50 Mg Tabs (Atenolol) .... Take one tablet by mouth daily 9)  Galantamine Hydrobromide 4 Mg Tabs (Galantamine hydrobromide) .Marland Kitchen.. 1 by mouth two times a day for memory 10)  Colcrys 0.6 Mg Tabs (Colchicine) .... As dirrected as needed gout  Patient Instructions: 1)  Please schedule a follow-up appointment in 2 months. 2)  Try to eat more raw plant food, fresh and dry fruit, raw almonds, leafy vegetables, whole foods and less red meat, less animal fat. Poultry and fish is better for you than pork and beef. Avoid processed foods (canned soups, hot dogs, sausage, bacon , frozen dinners). Avoid corn syrup, high fructose syrup or aspartam  containing drinks. Honey, Agave and Stevia are better sweeteners. Make your own  dressing with olive oil, wine vinegar, lemon juce, garlic etc. for your salads. 3)  BMP prior to visit, ICD-9: 4)  Hepatic Panel prior to visit, ICD-9:995.20 5)  TSH prior to visit, ICD-9: 244.8 Prescriptions: COLCRYS 0.6 MG TABS (COLCHICINE) as dirrected as needed gout  #30 x 3   Entered and Authorized by:   Cassandria Anger MD   Signed by:   Cassandria Anger MD on 08/14/2009   Method used:   Print then Give to Patient   RxID:   [id number] GALANTAMINE HYDROBROMIDE 4 MG TABS (GALANTAMINE HYDROBROMIDE) 1 by mouth two times a day for memory  #60 x 12   Entered and Authorized by:   Cassandria Anger MD   Signed by:   Cassandria Anger MD on 08/14/2009   Method used:   Print then Give to Patient   RxID:   [id number]

## 2010-03-26 NOTE — Progress Notes (Signed)
  Phone Note Outgoing Call   Call placed by: rhonda Call placed to: Patient Summary of Call: called pt to follow up with medication changes. pt stopped atenolol and started back with amlodipine. pt stated that he is feeling better and that pulse now up around 70 instead of around 50. he is tracking his bp and will continue to do so for a couple more weeks and then call us back to schedule a f/u appointment.

## 2010-03-26 NOTE — Progress Notes (Signed)
Summary: samples  Phone Note Call from Patient   Caller: Patient Summary of Call: Patient called requesting samples of Diovan Initial call taken by: Estell Harpin CMA,  February 27, 2010 11:34 AM  Follow-up for Phone Call        No answer, VM unavailable for messages, samples of Diovan 320mg  in cabinet for pt to take 1/2 tab once daily. Crissie Sickles, CMA  February 27, 2010 1:00 PM   Additional Follow-up for Phone Call Additional follow up Details #1::        Wife informed of above Additional Follow-up by: Charlsie Quest, CMA,  March 02, 2010 9:55 AM    New/Updated Medications: DIOVAN 160 MG TABS (VALSARTAN) 1 tablet by mouth once daily

## 2010-03-26 NOTE — Progress Notes (Signed)
Summary: speak to nurse  RX for Atenolol  Phone Note Call from Patient Call back at Work Phone [phone]   Caller: Patient Reason for Call: Talk to Nurse Summary of Call: request to speak about nurse with instructions from the last visit Initial call taken by: Darnell Level,  Jul 18, 2009 2:41 PM  Follow-up for Phone Call        spoke with pt about which of the three medications he took made him feel better.  Pt states that he really couldn't tell the difference but does know that his bp was higher with the Verapamil.  We reviewed side effects and actions of the medications as well as the cost and pt decided to continue with Atenolol 50 mg daily.  He will follow his BP and call us if he feels it is elevated or if he has any problems with the medication.  RX of Atenolol to be sent to [facility] at pt's request. Follow-up by: Sim Boast, RN,  Jul 18, 2009 3:07 PM    Prescriptions: ATENOLOL 50 MG TABS (ATENOLOL) Take one tablet by mouth daily  #30 x 11   Entered by:   Sim Boast, RN   Authorized by:   Nikki Dom, MD, [facility]   Signed by:   Sim Boast, RN on 07/18/2009   Method used:   Electronically to        [facility]. [phone]* (retail)       [address]       [place], [place]  [address]       Ph: [phone] or [phone]       Fax: [phone]   RxID:   [id number]

## 2010-03-26 NOTE — Assessment & Plan Note (Signed)
Summary: 2 mo rov /nws  #   Vital Signs:  Patient profile:   75 year old male Height:      74 inches Weight:      207 pounds BMI:     26.67 Temp:     98.4 degrees F oral Pulse rate:   56 / minute Pulse rhythm:   regular Resp:     16 per minute BP sitting:   130 / 88  (left arm) Cuff size:   regular  Vitals Entered By: Jonathon Resides, Gabrielle Dare) (October 22, 2009 1:29 PM)  Procedure Note  Incision & Drainage: The patient complains of pain and inflammation. Indication: infected lesion  Procedure # 1: I & D with packing    Size (in cm): 1.0 x 2.0    Region: lateral    Location: L axilla    Comment: Risks including but not limited by incomplete procedure, bleeding, infection, recurrence were discussed with the patient. Consent form was signed. 1 cc of pus and cream cheese like material was extracted. Wound probed, irrigated and packed. Tolerated well. Complicatons - none. Good pain relief following the procedure.     Instrument used: #11 blade    Anesthesia: 3.0 ml 1% lidocaine w/epinephrine  Cleaned and prepped with: alcohol, betadine, and deep debridement Wound dressing: neosporin and bulky gauze dressing Instructions: daily dressing changes  CC: 2 mo f/u Is Patient Diabetic? No   CC:  2 mo f/u.  History of Present Illness: The patient presents for a follow up of hypertension, memory loss, hyperlipidemia, CRF C/o an abscess in L underarm x 1 wk and a spot on R thigh and neck - scaling   Current Medications (verified): 1)  Adult Aspirin Low Strength 81 Mg  Tbdp (Aspirin) .... Once Daily 2)  Synthroid 50 Mcg Tabs (Levothyroxine Sodium) .... Once Daily 3)  Vitamin D3 1000 Unit  Tabs (Cholecalciferol) .... 2 Qd 4)  Simvastatin 40 Mg Tabs (Simvastatin) .... Take One Tablet At Bedtime 5)  Vitamin B-12 1000 Mcg Tabs (Cyanocobalamin) .... Once Daily 6)  Benazepril Hcl 20 Mg Tabs (Benazepril Hcl) .... Take 1 Tablet By Mouth Once A Day 7)  Furosemide 20 Mg Tabs (Furosemide)  .... Take One Tablet By Mouth Daily. 8)  Atenolol 50 Mg Tabs (Atenolol) .... Take One Tablet By Mouth Daily 9)  Galantamine Hydrobromide 4 Mg Tabs (Galantamine Hydrobromide) .Marland Kitchen.. 1 By Mouth Two Times A Day For Memory 10)  Colcrys 0.6 Mg Tabs (Colchicine) .... As Dirrected As Needed Gout  Allergies (verified): 1)  Hydrochlorothiazide 2)  Aricept  Past History:  Past Medical History: Last updated: 08/14/2009 Diverticulitis, hx of Hyperlipidemia Hypertension H/o hyperthyroidism, s/p 131I 2006 Dr Chalmers Cater, now hypothyroid Pancreatitis, hx of Transient ischemic attack, hx of Benign prostatic hypertrophy Anemia- TRASIENT GLOBAL SEVERAL Hypothyroidism hx of elevated PSA Dr ? Burman Freestone. 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    Physical Exam  General:  Well-developed,well-nourished,in no acute distress; alert,appropriate and cooperative throughout examination Head:  Normocephalic and atraumatic without obvious abnormalities. No apparent alopecia or balding. Ears:  no external deformities.   Mouth:  Oral mucosa and oropharynx without lesions or exudates.  Teeth in good repair. Neck:  No deformities, masses, or tenderness noted. Lungs:  Normal respiratory effort, chest expands symmetrically. Lungs are clear to auscultation, no crackles or wheezes. Heart:  Normal rate and regular rhythm. S1 and S2 normal without gallop, murmur, click, rub or other extra sounds. Abdomen:  Bowel sounds positive,abdomen soft and non-tender without masses, organomegaly or hernias noted. Msk:  No deformity or  scoliosis noted of thoracic or lumbar spine.   Extremities:  No clubbing, cyanosis, edema, or deformity noted with normal full range of motion of all joints.   Neurologic:  No cranial nerve deficits noted. Station and gait are normal. Plantar reflexes are down-going bilaterally. DTRs are symmetrical throughout. Sensory, motor and coordinative functions appear intact. Skin:  L axilla 2x1 cm boil R thigh AK, poster neck AK Psych:  Cognition and judgment appear intact. Alert and cooperative with normal attention span and concentration. No apparent delusions, illusions, hallucinations   Impression & Recommendations:  Problem # 1:  RENAL INSUFFICIENCY (ICD-588.9) Assessment Improved  Problem # 2:  HYPERKALEMIA (ICD-276.7) poss due to meds Assessment: New Recheck in 1 month  Problem # 3:  DYSPNEA ON EXERTION (ICD-786.09) Assessment: Unchanged Card appt is pending  His updated medication list for this problem includes:    Benazepril Hcl 20 Mg Tabs (Benazepril hcl) .Marland Kitchen... Take 1 tablet by mouth once a day    Furosemide 20 Mg Tabs (Furosemide) .Marland Kitchen... Take one tablet by mouth daily.    Atenolol 50 Mg Tabs (Atenolol) .Marland Kitchen... 1/2 by mouth bid  Problem # 4:  CORONARY ARTERY DISEASE NON OBSTRUCTIVE (ICD-414.00) Assessment: Unchanged  His updated medication list for this problem includes:    Adult Aspirin Low Strength 81 Mg Tbdp (Aspirin) ..... Once daily    Benazepril Hcl 20 Mg Tabs (Benazepril hcl) .Marland Kitchen... Take 1 tablet by mouth once a day    Furosemide 20 Mg Tabs (Furosemide) .Marland Kitchen... Take one tablet by mouth daily.    Atenolol 50 Mg Tabs (Atenolol) .Marland Kitchen... 1/2 by mouth bid  Problem # 5:  FATIGUE (ICD-780.79) multifactorial Assessment: Unchanged  Problem # 6:  MEMORY LOSS (ICD-780.93) Assessment: Unchanged On the regimen of medicine(s) reflected in the chart    Problem # 7:  ABSCESS, AXILLA (ICD-682.3) L Assessment: New See procedure note His updated medication list for this problem includes:     Doxycycline Hyclate 100 Mg Caps (Doxycycline hyclate) .Marland Kitchen... 1 by mouth two times a day with a glass of water  Problem # 8:  ACTINIC KERATOSIS (ICD-702.0) Assessment: New  Procedure: cryo Indication: 2 AK(s) Risks incl. scar(s), incomplete removal, ect.  and benefits discussed      lesion(s) on was/were treated with liqid N2 in usual fasion.  Tolerated well. Compl. none. Wound care instructions given.   Orders: Cryotherapy/Destruction benign or premalignant lesion (1st lesion)  (17000) Cryotherapy/Destruction benign or premalignant lesion (2nd-14th lesions) (17003)  Complete Medication List: 1)  Adult Aspirin Low Strength 81 Mg Tbdp (Aspirin) .... Once daily 2)  Synthroid 50 Mcg Tabs (Levothyroxine sodium) .... Once daily 3)  Vitamin D3 1000 Unit Tabs (Cholecalciferol) .... 2 qd 4)  Simvastatin 40 Mg Tabs (Simvastatin) .... Take one tablet at bedtime 5)  Vitamin B-12 1000 Mcg Tabs (Cyanocobalamin) .... Once daily 6)  Benazepril Hcl 20 Mg Tabs (Benazepril hcl) .... Take 1 tablet by mouth once a day 7)  Furosemide 20 Mg Tabs (Furosemide) .... Take one tablet by mouth daily. 8)  Atenolol 50 Mg Tabs (Atenolol) .... 1/2 by mouth bid  9)  Galantamine Hydrobromide 4 Mg Tabs (Galantamine hydrobromide) .Marland Kitchen.. 1 by mouth two times a day for memory 10)  Colcrys 0.6 Mg Tabs (Colchicine) .... As dirrected as needed gout 11)  Doxycycline Hyclate 100 Mg Caps (Doxycycline hyclate) .Marland Kitchen.. 1 by mouth two times a day with a glass of water 12)  Mupirocin 2 % Oint (Mupirocin) .... Use two times a day w/dressing change  Other Orders: I&D Abscess, Complex ([id number])  Patient Instructions: 1)  Please schedule a follow-up appointment in 3 months. 2)  Labs in 1 month BMET and PSA 995.20 596.0 Prescriptions: MUPIROCIN 2 % OINT (MUPIROCIN) use two times a day w/dressing change  #30 g x 0   Entered and Authorized by:   Cassandria Anger MD   Signed by:   Cassandria Anger MD on 10/22/2009   Method used:    Electronically to        [facility]. [phone]* (retail)       [place], [place]  [address]       Ph: [phone] or [phone]       Fax: [phone]   RxID:   [id number] DOXYCYCLINE HYCLATE 100 MG CAPS (DOXYCYCLINE HYCLATE) 1 by mouth two times a day with a glass of water  #20 x 0   Entered and Authorized by:   Cassandria Anger MD   Signed by:   Cassandria Anger MD on 10/22/2009   Method used:   Electronically to        [facility]. [phone]* (retail)       [place], [place]  [address]       Ph: [phone] or [phone]       Fax: [phone]   [id number]     Contraindications/Deferment of Procedures/Staging:    Test/Procedure: FLU VAX    Reason for deferment: patient declined

## 2010-03-26 NOTE — Miscellaneous (Signed)
Summary: Abcess I&D LT [place]  Abcess I&D LT [place] By: Phillis Knack 10/28/2009 10:40:08  _____________________________________________________________________  External Attachment:    Type:   Image     Comment:   External Document

## 2010-03-26 NOTE — Miscellaneous (Signed)
Summary: [facility]   Imported By: Bubba Hales 05/22/2009 11:27:23  _____________________________________________________________________  External Attachment:    Type:   Image     Comment:   External Document

## 2010-03-26 NOTE — Cardiovascular Report (Signed)
Summary: Pre Cath Orders  Pre Cath Orders   Imported By: Sallee Provencal 03/18/2009 11:04:53  _____________________________________________________________________  External Attachment:    Type:   Image     Comment:   External Document

## 2010-03-26 NOTE — Progress Notes (Signed)
Summary: REQ FOR RX FOR COUGH  Phone Note Call from Patient Call back at Work Phone [phone]   Reason for Call: Privacy/Consent Authorization Summary of Call: Pt c/o "hard" productive cough x 2 1/2 wks. Patient is requesting rx from MD.  Initial call taken by: Charlsie Quest, [place],  November 25, 2009 9:26 AM  Follow-up for Phone Call        prom w/cod as needed OV if sick Follow-up by: Cassandria Anger MD,  November 25, 2009 1:08 PM  Additional Follow-up for Phone Call Additional follow up Details #1::        Patient notified  and rx called in.Marland KitchenMarland KitchenMarland KitchenEllison Hughs Archie CMA  November 25, 2009 1:49 PM     New/Updated Medications: PROMETHAZINE-CODEINE 6.25-10 MG/5ML SYRP (PROMETHAZINE-CODEINE) 5-10 ml by mouth q id as needed cough Prescriptions: PROMETHAZINE-CODEINE 6.25-10 MG/5ML SYRP (PROMETHAZINE-CODEINE) 5-10 ml by mouth q id as needed cough  #300 ml x 0   Entered by:   Estell Harpin CMA   Authorized by:   Cassandria Anger MD   Signed by:   Estell Harpin CMA on 11/25/2009   Method used:   Telephoned to ...       [facility] [phone][facility] (retail)       [address]       [place], [place]  [address]       Ph: [phone] or [phone]       Fax: [phone]   RxID:   [id number]

## 2010-03-26 NOTE — Assessment & Plan Note (Signed)
Summary: 3 MO ROV /NWS  #   Vital Signs:  Patient profile:   75 year old male Height:      74 inches Weight:      208 pounds BMI:     26.80 Temp:     98.1 degrees F oral Pulse rate:   80 / minute Pulse rhythm:   regular Resp:     16 per minute BP sitting:   140 / 84  (left arm) Cuff size:   regular  Vitals Entered By: Jonathon Resides, CMA(AAMA) (January 28, 2010 1:27 PM) CC: 3 mo f/u  Is Patient Diabetic? No   CC:  3 mo f/u .  History of Present Illness: The patient presents for a follow up of hypertension, memory loss, hyperlipidemia. C/o HAs x 3 wks - he thinks it  was related to Benazepril...  Current Medications (verified): 1)  Adult Aspirin Low Strength 81 Mg  Tbdp (Aspirin) .... Once Daily 2)  Synthroid 50 Mcg Tabs (Levothyroxine Sodium) .... Once Daily 3)  Vitamin D3 1000 Unit  Tabs (Cholecalciferol) .... 2 Qd 4)  Simvastatin 40 Mg Tabs (Simvastatin) .... Take One Half Tablet At Bedtime 5)  Vitamin B-12 1000 Mcg Tabs (Cyanocobalamin) .... Once Daily 6)  Benazepril Hcl 20 Mg Tabs (Benazepril Hcl) .... Take 1 Tablet By Mouth Once A Day 7)  Furosemide 20 Mg Tabs (Furosemide) .... Every Other Day 8)  Galantamine Hydrobromide 4 Mg Tabs (Galantamine Hydrobromide) .Marland Kitchen.. 1 By Mouth Two Times A Day For Memory 9)  Amlodipine Besylate 10 Mg Tabs (Amlodipine Besylate) .... Once Daily  Allergies (verified): 1)  Hydrochlorothiazide 2)  Aricept  Past History:  Past Medical History: Last updated: 08/14/2009 Diverticulitis, hx of Hyperlipidemia Hypertension H/o hyperthyroidism, s/p 131I 2006 Dr Chalmers Cater, now hypothyroid Pancreatitis, hx of Transient ischemic attack, hx of Benign prostatic hypertrophy Anemia- TRASIENT GLOBAL SEVERAL Hypothyroidism hx of elevated PSA Dr ? Burman Freestone. 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    Physical Exam  General:  Well-developed,well-nourished,in no acute distress; alert,appropriate and cooperative throughout examination Head:  Normocephalic and atraumatic without obvious abnormalities. No apparent alopecia or balding. Ears:  wax R>L Mouth:  Oral mucosa and oropharynx without lesions or exudates.  Teeth in good repair. Neck:  No deformities, masses, or tenderness noted. Lungs:  Normal respiratory effort, chest expands symmetrically. Lungs are clear to auscultation, no crackles or wheezes. Heart:  Normal rate and regular rhythm. S1 and S2 normal without gallop, murmur, click, rub or other extra sounds. Abdomen:  Bowel sounds positive,abdomen soft and non-tender without masses, organomegaly or hernias noted. Msk:  No deformity or scoliosis noted of thoracic or lumbar spine.   Neurologic:  No cranial nerve deficits noted. Station and gait are normal. Plantar reflexes are down-going bilaterally. DTRs are symmetrical throughout. Sensory, motor and coordinative functions appear intact. Psych:  Cognition and judgment appear intact. Alert and cooperative with normal attention span and concentration. No apparent delusions, illusions, hallucinations   Impression & Recommendations:  Problem # 1:  HEADACHE (ICD-784.0) ? etiol - poss due to Benazepril Assessment New Hold Benazepril  Problem # 2:  HYPERTENSION (ICD-401.9) Assessment: Improved  His updated medication list for this problem includes:    Benazepril Hcl 20 Mg Tabs (Benazepril hcl) .Marland Kitchen... Take 1 tablet by mouth once a day    Furosemide 20 Mg Tabs (Furosemide) ..... Every other day    Amlodipine Besylate 10 Mg Tabs (Amlodipine besylate) ..... Once  daily  Problem # 3:  HYPERLIPIDEMIA (ICD-272.4) Assessment: Comment Only  His updated medication list for this problem includes:    Simvastatin 40 Mg Tabs (Simvastatin) .Marland Kitchen... Take one half tablet at bedtime - he has cut it in 1/2 due to fatigue  Problem # 4:  CERUMEN IMPACTION (ICD-380.4) R>L Assessment: New Procedure: ear irrigation Reason: wax impaction R>>L Risks/benefis were discussed. Both ears were irrigated with warm water. Large ammount of wax was recovered. Instrumentation with metal ear loop was performed to accomplish the removal. Tolerated well Complications: none    Complete Medication List: 1)  Adult Aspirin Low Strength 81 Mg Tbdp (Aspirin) .... Once daily 2)  Synthroid 50 Mcg Tabs (Levothyroxine sodium) .... Once daily 3)  Vitamin D3 1000 Unit Tabs (Cholecalciferol) .... 2 qd 4)  Simvastatin 40 Mg Tabs (Simvastatin) .... Take one half tablet at bedtime 5)  Vitamin B-12 1000 Mcg Tabs (Cyanocobalamin) .... Once daily 6)  Benazepril Hcl 20 Mg Tabs (Benazepril hcl) .... Take 1 tablet by mouth once a day 7)  Furosemide 20 Mg Tabs (Furosemide) .... Every other day 8)  Galantamine Hydrobromide 4 Mg Tabs (Galantamine hydrobromide) .Marland Kitchen.. 1 by mouth two times a day for memory 9)  Amlodipine Besylate 10 Mg Tabs (Amlodipine besylate) .... Once daily 10)  Cortisporin 3.5-10000-1 Soln (Neomycin-polymyxin-hc) .... 3 gtt in r ear tid  Other Orders: Cerumen Impaction Removal [id number])  Patient Instructions: 1)  Stop Benazepril. Start Diovan 160 mg daily instead 2)  OK to hold Simvastatin if tired 3)  Please schedule a follow-up appointment in 3 months. 4)  BMP prior to visit, ICD-9: 5)  Hepatic Panel prior to visit, ICD-9: 6)  Lipid Panel prior to visit, ICD-9:272.20 Prescriptions: CORTISPORIN 3.5-10000-1 SOLN (NEOMYCIN-POLYMYXIN-HC) 3 gtt in R ear tid  #1 x 1   Entered and Authorized by:   Cassandria Anger MD   Signed by:   Cassandria Anger MD on 01/28/2010   Method  used:   Print then Give to Patient   RxID:   [id number]    Orders Added: 1)  Est. Patient Level IV [id number] 2)  Cerumen Impaction Removal [id number]

## 2010-03-26 NOTE — Assessment & Plan Note (Signed)
Summary: 3 mos f/u//#/cd   Vital Signs:  Patient profile:   75 year old male Weight:      209 pounds Temp:     97.5 degrees F oral Pulse rate:   57 / minute BP sitting:   120 / 70  (left arm)  Vitals Entered By: Doralee Albino (May 13, 2009 1:19 PM) CC: f/u Is Patient Diabetic? No   CC:  f/u.  History of Present Illness: The patient presents for a follow up of hypertension, CAD, memory loss, hyperlipidemia. He stopped Aricept due to diarrhea. Had a heart cath.   Preventive Screening-Counseling & Management  Alcohol-Tobacco     Smoking Status: never  Allergies: 1)  Hydrochlorothiazide 2)  Aricept  Past History:  Past Medical History: Diverticulitis, hx of Hyperlipidemia Hypertension H/o hyperthyroidism, s/p 131I 2006 Dr Chalmers Cater, now hypothyroid Pancreatitis, hx of Transient ischemic attack, hx of Benign prostatic hypertrophy Anemia- TRASIENT GLOBAL SEVERAL Hypothyroidism hx of elevated PSA Dr ? Burman Freestone. 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   Nose:  no external deformity.   Mouth:  Oral mucosa and oropharynx without lesions or exudates.  Teeth in good repair. Neck:  No deformities, masses, or tenderness noted. Lungs:  Normal respiratory effort, chest expands symmetrically. Lungs are clear to auscultation, no crackles or wheezes. Abdomen:  Bowel sounds positive,abdomen soft and non-tender without masses, organomegaly or hernias noted. Msk:  No deformity or scoliosis noted of thoracic or lumbar spine.   Extremities:  No clubbing, cyanosis, edema, or deformity noted with normal full range of motion of all joints.   Neurologic:  No cranial nerve deficits noted. Station and gait are normal.  Plantar reflexes are down-going bilaterally. DTRs are symmetrical throughout. Sensory, motor and coordinative functions appear intact. Skin:  Intact without suspicious lesions or rashes Psych:  Cognition and judgment appear intact. Alert and cooperative with normal attention span and concentration. No apparent delusions, illusions, hallucinations   Impression & Recommendations:  Problem # 1:  CORONARY ARTERY DISEASE (ICD-414.00) Assessment Comment Only Records reviewed. His updated medication list for this problem includes:    Adult Aspirin Low Strength 81 Mg Tbdp (Aspirin) ..... Once daily    Amlodipine Besy-benazepril Hcl 10-20 Mg Caps (Amlodipine besy-benazepril hcl) .Marland Kitchen... 1 by mouth once daily for blood pressure    Metoprolol Tartrate 50 Mg Tabs (Metoprolol tartrate) .Marland Kitchen..Marland Kitchen Two times a day  Problem # 2:  MEMORY LOSS (ICD-780.93) Assessment: Unchanged  Problem # 3:  HYPOTHYROIDISM (ICD-244.9) Assessment: Unchanged  His updated medication list for this problem includes:    Synthroid 50 Mcg Tabs (Levothyroxine sodium) ..... Once daily  Problem # 4:  HYPERLIPIDEMIA (ICD-272.4) Assessment: Unchanged  His updated medication list for this problem includes:    Simvastatin 40 Mg Tabs (Simvastatin) .Marland Kitchen... Take one tablet at bedtime  Problem # 5:  HYPERTENSION (ICD-401.9) Assessment: Improved  His updated medication list for this problem includes:    Amlodipine Besy-benazepril Hcl 10-20 Mg Caps (Amlodipine besy-benazepril hcl) .Marland Kitchen... 1 by mouth once daily for blood pressure    Metoprolol Tartrate 50 Mg Tabs (Metoprolol tartrate) .Marland Kitchen..Marland Kitchen Two times a day  Complete Medication List: 1)  Adult Aspirin Low Strength 81 Mg Tbdp (Aspirin) .... Once daily 2)  Synthroid 50 Mcg Tabs (Levothyroxine sodium) .... Once daily 3)  Vitamin D3 1000 Unit Tabs (Cholecalciferol) .... 2 qd 4)  Simvastatin 40 Mg Tabs (Simvastatin) .... Take one tablet at bedtime 5)  Amlodipine Besy-benazepril Hcl 10-20 Mg Caps  (Amlodipine besy-benazepril hcl) .Marland Kitchen.. 1 by mouth once daily for blood pressure 6)  Metoprolol Tartrate 50 Mg Tabs (Metoprolol tartrate) .... Two times a day 7)  Vitamin B-12 1000 Mcg Tabs (Cyanocobalamin) .... Once daily 8)  Exelon 9.5 Mg/24hr Pt24 (Rivastigmine) .... Change once daily (for memory)  Patient Instructions: 1)  Please schedule a follow-up appointment in 3 months. Prescriptions: EXELON 9.5 MG/24HR PT24 (RIVASTIGMINE) change once daily (for memory)  #30 x 12   Entered and Authorized by:   Cassandria Anger MD   Signed by:   Cassandria Anger MD on 05/13/2009   Method used:   Print then Give to Patient   RxID:   [id number]

## 2010-03-26 NOTE — Progress Notes (Signed)
Summary: LABS  Phone Note Call from Patient   Summary of Call: Patient is requesting to have TSH added to labs he did today. Last tsh was 09/2009, is it too early?  Initial call taken by: Charlsie Quest, [place],  January 26, 2010 9:32 AM  Follow-up for Phone Call        ok tsh 244.8 Follow-up by: Cassandria Anger MD,  January 26, 2010 1:21 PM  Additional Follow-up for Phone Call Additional follow up Details #1::        Faxed & called lab regarding add on request.  Additional Follow-up by: Charlsie Quest, CMA,  January 27, 2010 8:21 AM

## 2010-03-26 NOTE — Progress Notes (Signed)
Summary: SYNTHROID REFILL/ GOING OUT OF TOWN  Phone Note Call from Patient Call back at Home Phone [phone]   Caller: Patient Summary of Call: MR Iiams WAS HERE YESTERDAY.  HE FORGOT TO TELL DR. Shaolin Armas TO REFILL HIS SYNTHROID 50 MG.  HE IS LEAVING TOWN THIS MORNING FOR OVER A WEEK AND ONLY HAS 2 PILLS LEFT.  CAN THIS BE CALLED INTO [facility] [address] OR CAN HE COME BY THIS MORNING TO PICK UP SAMPLES?  CALL HIM ON HIS CELL [phone].  HE WANTS TO LEAVE TOWN BY 10:00. Initial call taken by: Glena Norfolk,  August 15, 2009 8:49 AM  Follow-up for Phone Call        Done -  Pt informed Follow-up by: Crissie Sickles, [place],  August 15, 2009 9:23 AM    Prescriptions: SYNTHROID 50 MCG TABS (LEVOTHYROXINE SODIUM) once daily  #30 x 2   Entered by:   Crissie Sickles, CMA   Authorized by:   Cassandria Anger MD   Signed by:   Crissie Sickles, CMA on 08/15/2009   Method used:   Electronically to        [facility]. [phone]* (retail)       [address]       [place], [place]  [address]       Ph: [phone] or [phone]       Fax: [phone]   RxID:   [id number]

## 2010-03-26 NOTE — Assessment & Plan Note (Signed)
Summary: per check out/sf   CC:  follow up.  Pt states he get a tired a little easier these days.  Marland Kitchen  History of Present Illness: This is a 75 year old white male patient, who had PVCs and 46 beats of wide complex tachycardia while doing a stress Myoview. There was minimal abnormality on the scan and cardiac catheter was recommended. This was done February 26, 2009 by Dr. Eustace Quail via right radial artery. This revealed normal coronary arteries except for a 40% narrowing and plaque of the LAD after the first diagonal with irregular circumflex and RCA normal LV function. Dr. Maurene Capes recommended reassurance and followup with Dr. Caryl Comes.  The patient has been doing rather well. He has complaints however of peripheral edema as well as significant fatigue and lethargy which dates back to the initiation of his Lopressor. He also says blood pressures have been high.  Previously he was on diuretic therapy which was associated with gout. He has a listed allergy to hydrochlorothiazide which I suspect is is the gout Association.  Current Medications (verified): 1)  Adult Aspirin Low Strength 81 Mg  Tbdp (Aspirin) .... Once Daily 2)  Synthroid 50 Mcg Tabs (Levothyroxine Sodium) .... Once Daily 3)  Vitamin D3 1000 Unit  Tabs (Cholecalciferol) .... 2 Qd 4)  Simvastatin 40 Mg Tabs (Simvastatin) .... Take One Tablet At Bedtime 5)  Amlodipine Besy-Benazepril Hcl 10-20 Mg Caps (Amlodipine Besy-Benazepril Hcl) .Marland Kitchen.. 1 By Mouth Once Daily For Blood Pressure 6)  Metoprolol Tartrate 50 Mg Tabs (Metoprolol Tartrate) .... Two Times A Day 7)  Vitamin B-12 1000 Mcg Tabs (Cyanocobalamin) .... Once Daily 8)  Exelon 9.5 Mg/24hr Pt24 (Rivastigmine) .... Change Once Daily (For Memory)  Allergies (verified): 1)  Hydrochlorothiazide 2)  Aricept  Vital Signs:  Patient profile:   75 year old male Height:      74 inches Weight:      208 pounds BMI:     26.80 Pulse rate:   55 / minute Pulse rhythm:   regular BP sitting:    144 / 76  (left arm) Cuff size:   large  Vitals Entered By: Doug Sou CMA (June 06, 2009 2:23 PM)  Physical Exam  General:  The patient was alert and oriented in no acute distress. HEENT Normal.  Neck veins were flat, carotids were brisk.  Lungs were clear.  Heart sounds were regular without murmurs or gallops.  Abdomen was soft with active bowel sounds. There is no clubbing cyanosis; mild edema Skin Warm and dry    EKG  Procedure date:  06/06/2009  Findings:      sinus rhythm at 55 Intervals 0.18 5.10/0.42 Axis is 26 ST Segment flattening diffusely Poor R wave progression  Impression & Recommendations:  Problem # 1:  VENTRICULAR TACHYCARDIA NONSUSTAINED ON TREADMILL TESTING (ICD-427.1) the patient had exercise associated ventricular tachycardia. The absence of significant structural disease and/or coronary obstruction I am not sure how malignant a condition it represents. I would however like to keep him on a beta blocker. However, he is clearly not tolerating his metoprolol tartrate well. We will discontinue it and given prescriptions to atenolol 50 and Inderal LA 80 to see if he can tolerate it as these drugs any better. His further alternative would be verapmail The following medications were removed from the medication list:    Amlodipine Besy-benazepril Hcl 10-20 Mg Caps (Amlodipine besy-benazepril hcl) .Marland Kitchen... 1 by mouth once daily for blood pressure    Metoprolol Tartrate 50 Mg Tabs (Metoprolol  tartrate) .Marland Kitchen..Marland Kitchen Two times a day His updated medication list for this problem includes:    Adult Aspirin Low Strength 81 Mg Tbdp (Aspirin) ..... Once daily    Benazepril Hcl 20 Mg Tabs (Benazepril hcl) .Marland Kitchen... Take 1 tablet by mouth once a day    Atenolol 50 Mg Tabs (Atenolol) .Marland Kitchen... Take one tablet by mouth daily  The following medications were removed from the medication list:    Amlodipine Besy-benazepril Hcl 10-20 Mg Caps (Amlodipine besy-benazepril hcl) .Marland Kitchen... 1 by mouth  once daily for blood pressure    Metoprolol Tartrate 50 Mg Tabs (Metoprolol tartrate) .Marland Kitchen..Marland Kitchen Two times a day His updated medication list for this problem includes:    Adult Aspirin Low Strength 81 Mg Tbdp (Aspirin) ..... Once daily    Benazepril Hcl 20 Mg Tabs (Benazepril hcl) .Marland Kitchen... Take 1 tablet by mouth once a day    Atenolol 50 Mg Tabs (Atenolol) .Marland Kitchen... Take one tablet by mouth daily  Problem # 2:  FATIGUE (ICD-780.79) please see above  Problem # 3:  HYPERTENSION (ICD-401.9)  his hypertension is an issue. He has edema which I suspect is complicating his amlodipine and this has been discontinued. We have continued him on his benazapril. He is listed as having an allergy to hctz; because of this we'll put him on a low dose of Lasix to augment his edema control as well as to augment blood pressure control. I've asked that he followup with Dr. Dorann Lodge off in the next 4-6 weeks or so. We will check a metabolic profile in a couple of weeks. The following medications were removed from the [facility] list:    Amlodipine Besy-benazepril Hcl 10-20 Mg Caps (Amlodipine besy-benazepril hcl) .Marland Kitchen... 1 by mouth once daily for blood pressure    Metoprolol Tartrate 50 Mg Tabs (Metoprolol tartrate) .Marland Kitchen..Marland Kitchen Two times a day His updated medication list for this problem includes:    Adult Aspirin Low Strength 81 Mg Tbdp (Aspirin) ..... Once daily    Benazepril Hcl 20 Mg Tabs (Benazepril hcl) .Marland Kitchen... Take 1 tablet by mouth once a day    Furosemide 20 Mg Tabs (Furosemide) .Marland Kitchen... Take one tablet by mouth daily.    Atenolol 50 Mg Tabs (Atenolol) .Marland Kitchen... Take one tablet by mouth daily  The following medications were removed from the medication list:    Amlodipine Besy-benazepril Hcl 10-20 Mg Caps (Amlodipine besy-benazepril hcl) .Marland Kitchen... 1 by mouth once daily for blood pressure    Metoprolol Tartrate 50 Mg Tabs (Metoprolol tartrate) .Marland Kitchen..Marland Kitchen Two times a day His updated medication list for this problem includes:    Adult Aspirin  Low Strength 81 Mg Tbdp (Aspirin) ..... Once daily    Benazepril Hcl 20 Mg Tabs (Benazepril hcl) .Marland Kitchen... Take 1 tablet by mouth once a day    Furosemide 20 Mg Tabs (Furosemide) .Marland Kitchen... Take one tablet by mouth daily.    Atenolol 50 Mg Tabs (Atenolol) .Marland Kitchen... Take one tablet by mouth daily  Other Orders: EKG w/ Interpretation (93000)  Patient Instructions: 1)  Your physician has recommended you make the following change in your medication: Stop Amlodipine/Benazepril.  Stop your Metoprolol.  Start Benazepril 20mg  1 tablet daily, Start Furosemide 20mg  1 tablet daily.  Start Atenolol 50mg  1 tablet daily. If you don't tolerate your Atenolol, try Inderal and Verapamil one at a time.  Let us know which one you like best and we will refill that one.  2)  Your physician recommends that you schedule a follow-up appointment in: 2 months with  Dr Alain Marion. 3)  Your physician recommends that you return for lab work in: 2 weeks for BMP dx 401.1 4)  Your physician wants you to follow-up in: 6 months with Dr Caryl Comes.  You will receive a reminder letter in the mail two months in advance. If you don't receive a letter, please call our office to schedule the follow-up appointment. Prescriptions: ATENOLOL 50 MG TABS (ATENOLOL) Take one tablet by mouth daily  #14 x 0   Entered by:   Advertising account executive BSN   Authorized by:   Nikki Dom, MD, [facility]   Signed by:   Chanetta Marshall RN BSN on 06/06/2009   Method used:   Electronically to        [facility]. [phone]* (retail)       [place], [place]  [address]       Ph: [phone] or [phone]       Fax: [phone]   RxID:   [id number] FUROSEMIDE 20 MG TABS (FUROSEMIDE) Take one tablet by mouth daily.  #30 x 11   Entered by:   Advertising account executive BSN   Authorized by:   Nikki Dom, MD, [facility]   Signed by:   Chanetta Marshall RN BSN on 06/06/2009   Method used:   Electronically to        [facility]. [phone]* (retail)        [place], [place]  [address]       Ph: [phone] or [phone]       Fax: [phone]   RxID:   [id number] BENAZEPRIL HCL 20 MG TABS (BENAZEPRIL HCL) Take 1 tablet by mouth once a day  #30 x 11   Entered by:   Advertising account executive BSN   Authorized by:   Nikki Dom, MD, [facility]   Signed by:   Chanetta Marshall RN BSN on 06/06/2009   Method used:   Electronically to        [facility]. [phone]* (retail)       [address]       [place], [place]  [address]       Ph: [phone] or [phone]       Fax: [phone]   RxID:   [id number]

## 2010-03-26 NOTE — Therapy (Signed)
Summary: [facility]   Imported By: Phillis Knack 02/20/2010 08:35:36  _____________________________________________________________________  External Attachment:    Type:   Image     Comment:   External Document

## 2010-03-26 NOTE — Progress Notes (Signed)
Summary: Pt having headaches  Phone Note Call from Patient Call back at [phone]   Caller: Daughter/ [name] Reason for Call: Insurance Question Summary of Call: Pt daughter calling regarding pt having headaches since B/P medication was changed at last appt Initial call taken by: Delsa Sale,  January 20, 2010 3:58 PM  Follow-up for Phone Call        resume atenolol and foollowup with dr plotnikov for blood pressure Follow-up by: Nikki Dom, MD, [facility],  January 20, 2010 5:36 PM     Appended Document: Pt having headaches adv pt can start taking atenolol again and to see Dr. Lajuana Carry for BP control. Pt Daughter stated she had an appointment set up for him tomorrow but pt cancelled. Pt stated for last several weeks he has had an afternoon HA daily.  He has been taking his BP and averages around 150/80's.  He will call Dr. Alain Marion tomorrow. Joan Flores, RN, BSN

## 2010-04-28 ENCOUNTER — Other Ambulatory Visit: Payer: Self-pay

## 2010-05-04 ENCOUNTER — Telehealth: Payer: Self-pay | Admitting: Internal Medicine

## 2010-05-04 ENCOUNTER — Other Ambulatory Visit: Payer: Medicare Other

## 2010-05-04 ENCOUNTER — Other Ambulatory Visit: Payer: Self-pay | Admitting: Internal Medicine

## 2010-05-04 ENCOUNTER — Encounter (INDEPENDENT_AMBULATORY_CARE_PROVIDER_SITE_OTHER): Payer: Self-pay | Admitting: *Deleted

## 2010-05-04 DIAGNOSIS — E785 Hyperlipidemia, unspecified: Secondary | ICD-10-CM

## 2010-05-04 LAB — HEPATIC FUNCTION PANEL
AST: 20 U/L (ref 0–37)
Albumin: 3.7 g/dL (ref 3.5–5.2)
Alkaline Phosphatase: 79 U/L (ref 39–117)
Bilirubin, Direct: 0.1 mg/dL (ref 0.0–0.3)
Total Bilirubin: 0.8 mg/dL (ref 0.3–1.2)
Total Protein: 6.7 g/dL (ref 6.0–8.3)

## 2010-05-04 LAB — LIPID PANEL
Cholesterol: 193 mg/dL (ref 0–200)
HDL: 31 mg/dL — ABNORMAL LOW (ref >39.00)
Total CHOL/HDL Ratio: 6
Triglycerides: 252 mg/dL — ABNORMAL HIGH (ref 0.0–149.0)
VLDL: 50.4 mg/dL — ABNORMAL HIGH (ref 0.0–40.0)

## 2010-05-04 LAB — BASIC METABOLIC PANEL
BUN: 19 mg/dL (ref 6–23)
CO2: 26 mEq/L (ref 19–32)
Calcium: 9.5 mg/dL (ref 8.4–10.5)
Chloride: 103 mEq/L (ref 96–112)
Creatinine, Ser: 1.4 mg/dL (ref 0.4–1.5)
GFR: 52.84 mL/min — ABNORMAL LOW (ref >60.00)
Glucose, Bld: 78 mg/dL (ref 70–99)
Potassium: 4.5 mEq/L (ref 3.5–5.1)
Sodium: 137 mEq/L (ref 135–145)

## 2010-05-04 LAB — LDL CHOLESTEROL, DIRECT: Direct LDL: 114.5 mg/dL

## 2010-05-05 ENCOUNTER — Encounter: Payer: Self-pay | Admitting: Internal Medicine

## 2010-05-05 ENCOUNTER — Ambulatory Visit (INDEPENDENT_AMBULATORY_CARE_PROVIDER_SITE_OTHER): Payer: Medicare Other | Admitting: Internal Medicine

## 2010-05-05 DIAGNOSIS — N259 Disorder resulting from impaired renal tubular function, unspecified: Secondary | ICD-10-CM

## 2010-05-05 DIAGNOSIS — I2510 Atherosclerotic heart disease of native coronary artery without angina pectoris: Secondary | ICD-10-CM

## 2010-05-05 DIAGNOSIS — J069 Acute upper respiratory infection, unspecified: Secondary | ICD-10-CM

## 2010-05-05 DIAGNOSIS — E875 Hyperkalemia: Secondary | ICD-10-CM

## 2010-05-05 DIAGNOSIS — I10 Essential (primary) hypertension: Secondary | ICD-10-CM

## 2010-05-05 HISTORY — DX: Acute upper respiratory infection, unspecified: J06.9

## 2010-05-12 NOTE — Progress Notes (Signed)
Summary: Diovan  Phone Note Call from Patient Call back at Home Phone [phone] Call back at Work Phone [phone]   Summary of Call: Patient is requesting samples or rx for Diovan 160.  Initial call taken by: Charlsie Quest, [place],  May 04, 2010 11:00 AM  Follow-up for Phone Call        no samples avail at this time, rx sent in, Pt informed  Follow-up by: Charlsie Quest, CMA,  May 04, 2010 3:58 PM    Prescriptions: DIOVAN 160 MG TABS (VALSARTAN) 1 tablet by mouth once daily  #30 x 5   Entered by:   Charlsie Quest, CMA   Authorized by:   Cassandria Anger MD   Signed by:   Charlsie Quest, CMA on 05/04/2010   Method used:   [facility]  [address] [phone]* (retail)       [address]       [place], [place]  [address]       Ph: [phone] or [phone]       Fax: [phone]   RxID:   [id number]

## 2010-05-12 NOTE — Assessment & Plan Note (Signed)
Summary: 3 MO FU   /CD /NWS #   Vital Signs:  Patient profile:   75 year old male Height:      74 inches Weight:      209 pounds BMI:     26.93 Temp:     98.3 degrees F oral Pulse rate:   80 / minute Pulse rhythm:   regular Resp:     16 per minute BP sitting:   120 / 70  (left arm) Cuff size:   regular  Vitals Entered By: Jonathon Resides, Gabrielle Dare) (May 05, 2010 1:38 PM) CC: 3 mo f/u Is Patient Diabetic? No   CC:  3 mo f/u.  History of Present Illness: The patient presents for a follow up of hypertension, memory loss, hyperlipidemia The patient presents with complaints of sore throat, fever, cough, sinus congestion and drainge of several days duration. Not better with OTC meds. .  The mucus is not colored. Better...   Current Medications (verified): 1)  Adult Aspirin Low Strength 81 Mg  Tbdp (Aspirin) .... Once Daily 2)  Synthroid 50 Mcg Tabs (Levothyroxine Sodium) .... Once Daily 3)  Vitamin D3 1000 Unit  Tabs (Cholecalciferol) .... 2 Qd 4)  Simvastatin 40 Mg Tabs (Simvastatin) .... Take One Half Tablet At Bedtime 5)  Vitamin B-12 1000 Mcg Tabs (Cyanocobalamin) .... Once Daily 6)  Diovan 160 Mg Tabs (Valsartan) .Marland Kitchen.. 1 Tablet By Mouth Once Daily 7)  Furosemide 20 Mg Tabs (Furosemide) .... Every Other Day 8)  Galantamine Hydrobromide 4 Mg Tabs (Galantamine Hydrobromide) .Marland Kitchen.. 1 By Mouth Two Times A Day For Memory 9)  Amlodipine Besylate 10 Mg Tabs (Amlodipine Besylate) .... Once Daily  Allergies (verified): 1)  Hydrochlorothiazide 2)  Aricept  Past History:  Past Medical History: Last updated: 08/14/2009 Diverticulitis, hx of Hyperlipidemia Hypertension H/o hyperthyroidism, s/p 131I 2006 Dr Chalmers Cater, now hypothyroid Pancreatitis, hx of Transient ischemic attack, hx of Benign prostatic hypertrophy Anemia- TRASIENT GLOBAL SEVERAL Hypothyroidism hx of elevated PSA Dr ? Burman Freestone. 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    Physical Exam  General:  Well-developed,well-nourished,in no acute distress; alert,appropriate and cooperative throughout examination Head:  Normocephalic and atraumatic without obvious abnormalities. No apparent alopecia or balding. Ears:  Decr hearing Nose:  no external deformity.   Mouth:  Erythematous throat and intranasal mucosa c/w URI  Neck:  No deformities, masses, or tenderness noted. Lungs:  Normal respiratory effort, chest expands symmetrically. Lungs are clear to auscultation, no crackles or wheezes. Heart:  Normal rate and regular rhythm. S1 and S2 normal without gallop, murmur, click, rub or other extra sounds. Abdomen:  Bowel sounds positive,abdomen soft and non-tender without masses, organomegaly or hernias noted. Msk:  No deformity or scoliosis noted of thoracic or lumbar spine.   Neurologic:  No cranial nerve deficits noted. Station and gait are normal. Plantar reflexes are down-going bilaterally. DTRs are symmetrical throughout. Sensory, motor and coordinative functions appear intact. Skin:  L axilla 2x1 cm boil R thigh AK, poster neck AK Psych:  Cognition and judgment appear intact. Alert and cooperative with normal attention span and concentration. No apparent delusions, illusions, hallucinations   Impression & Recommendations:  Problem # 1:  MEMORY LOSS (ICD-780.93) Assessment Unchanged  On the regimen of medicine(s) reflected in the chart    Problem # 2:  CORONARY ARTERY DISEASE NON OBSTRUCTIVE (ICD-414.00)  The following medications were removed from the medication list:    Diovan 160 Mg Tabs (Valsartan) .Marland Kitchen... 1 tablet by mouth once daily His updated medication list  for this problem includes:    Adult Aspirin Low Strength 81 Mg Tbdp (Aspirin) ..... Once daily    Furosemide 20 Mg Tabs (Furosemide) ..... Every other day    Amlodipine Besylate 10 Mg Tabs (Amlodipine besylate) ..... Once daily    Losartan Potassium 100 Mg Tabs (Losartan potassium) .Marland Kitchen... 1 by mouth once daily for blood pressure  Problem # 3:  RENAL INSUFFICIENCY (ICD-588.9) Assessment: Unchanged The labs were reviewed with the patient.   Problem # 4:  HYPERTENSION (ICD-401.9) Assessment: Unchanged  The following medications were removed from the medication list:    Diovan 160 Mg Tabs (Valsartan) .Marland Kitchen... 1 tablet by mouth once daily His updated medication list for this problem includes:    Furosemide 20 Mg Tabs (Furosemide) ..... Every other day    Amlodipine Besylate 10 Mg Tabs (Amlodipine besylate) ..... Once daily    Losartan Potassium 100 Mg Tabs (Losartan potassium) .Marland Kitchen... 1 by mouth once daily for blood pressure  Problem # 5:  TRANSIENT ISCHEMIC ATTACK, HX OF (ICD-V12.50) Assessment: Unchanged On the regimen of medicine(s) reflected in the chart    Problem # 6:  HYPERLIPIDEMIA (ICD-272.4)  His updated medication list for this problem includes:    Simvastatin 40 Mg Tabs (Simvastatin) .Marland Kitchen... Take one  tablet at bedtime  Problem # 7:  UPPER RESPIRATORY INFECTION, ACUTE (ICD-465.9) Assessment: Improved Use over-the-counter medicines for "cold": Tylenol  650mg  or Advil 400mg  every 6 hours  for fever; Delsym or Robutussin for cough. Mucinex or Mucinex D for congestion. Ricola or Halls for sore throat. Office visit if not better or if worse.   Complete Medication List: 1)  Adult Aspirin Low Strength 81 Mg Tbdp (Aspirin) .... Once daily 2)  Synthroid 50 Mcg Tabs (Levothyroxine sodium) .... Once daily 3)  Vitamin D3 1000 Unit Tabs (Cholecalciferol) .... 2 qd 4)  Simvastatin 40 Mg Tabs (Simvastatin) .... Take one  tablet at bedtime 5)  Vitamin B-12 1000 Mcg Tabs (Cyanocobalamin) ....  Once daily 6)  Furosemide 20 Mg Tabs (Furosemide) .... Every other day 7)  Galantamine Hydrobromide 4 Mg Tabs (Galantamine hydrobromide) .Marland Kitchen.. 1 by mouth two times a day for memory 8)  Amlodipine Besylate 10 Mg Tabs (Amlodipine besylate) .... Once daily 9)  Losartan Potassium 100 Mg Tabs (Losartan potassium) .Marland Kitchen.. 1 by mouth once daily for blood pressure  Patient Instructions: 1)  Please schedule a follow-up appointment in 4 months. 2)  BMP prior to visit, ICD-9: 3)  Hepatic Panel prior to visit, ICD-9: 4)  Lipid Panel prior to visit, ICD-9: 995.20 272.20 5)  Use over-the-counter medicines for "cold": Tylenol  650mg  or Advil 400mg  every 6 hours  for fever; Delsym or Robutussin for cough. Mucinex for congestion. Ricola or Halls for sore throat. Office visit if not better or if worse.  Prescriptions: LOSARTAN POTASSIUM 100 MG TABS (LOSARTAN POTASSIUM) 1 by mouth once daily for blood pressure  #30 x 12   Entered and Authorized by:   Cassandria Anger MD   Signed by:   Cassandria Anger MD on 05/05/2010   Method used:   Print then Give to Patient   RxID:   [id number]    Orders Added: 1)  Est. Patient Level IV [id number]

## 2010-05-16 ENCOUNTER — Other Ambulatory Visit: Payer: Self-pay | Admitting: Internal Medicine

## 2010-05-24 ENCOUNTER — Other Ambulatory Visit: Payer: Self-pay | Admitting: Internal Medicine

## 2010-07-10 NOTE — H&P (Signed)
NAME:  Kenneth Williams, DEMAREST NO.:  [id number]   MEDICAL RECORD NO.:  [id number]          PATIENT TYPE:  EMS   LOCATION:  [facility]                         FACILITY:  [facility]   PHYSICIAN:  Domingo Madeira, MD   DATE OF BIRTH:  10/01/1935   DATE OF ADMISSION:  02/26/2004  DATE OF DISCHARGE:                                HISTORY & PHYSICAL   HISTORY OF PRESENT ILLNESS:  The patient was not able to give me much, but  from the nursing staff and from the patient himself, apparently his wife and  daughter brought him into the hospital today, he does not know why.  When I  was asked him what was the last thing he remembered, he was not able to  express anything.  All he said, he was laid off in March and he was at the  unemployment office yesterday.  According to the nurse, at 11:30 p.m. last  night he was fine and at 9:30 a.m. this morning after he got out of the  shower, he did not remember anything.  This prompted the daughter and the  wife to bring him to the hospital.   PAST MEDICAL HISTORY:  1.  Hypertension.  2.  Hyperlipidemia   MEDICATIONS:  Zocor and Zestril, multivitamin.   ALLERGIES:  None.   SURGERIES:  Cholecystectomy.   FAMILY HISTORY:  Noncontributory.   SOCIAL HISTORY:  The patient lives in [place] with his wife.  He has  three grown children.  Denies any tobacco, alcohol, or illicit drug use.   PHYSICAL EXAMINATION:  VITAL SIGNS:  Blood pressure 155/91, heart rate of  88, respiratory rate of 20, saturating 98% on room air.  HEENT:  Head atraumatic, normocephalic.  Eyes __________.  Pupils equal,  round, and reactive to light, extraocular movements intact.  Ears and nose:  No discharge.  Throat clear, no erythema, no exudate.  NECK:  Supple, no lymphadenopathy.  CARDIAC:  Regular rate and rhythm.  No murmurs, rubs, or gallops are  appreciated.  CHEST:  Lungs clear to auscultation bilaterally.  ABDOMEN:  Soft, nontender, nondistended.  EXTREMITIES:   No edema, cyanosis, or clubbing.  NEUROLOGIC:  The patient was alert and oriented x3.  He did repeat things  quite frequently, asking repeatedly whether it was Wednesday or not.  Otherwise, neurologically he had 5/5 strength in upper and lower  extremities.  Cranial nerves II-XII were intact.   LABORATORY DATA:  Sodium 139, potassium 4.2, chloride of 107, BUN of 15,  glucose at 100.  A pH of 7.4, PCO2 of 42, bicarb at 26.  Hemoglobin of 14,  hematocrit of 47.  Myoglobin 127, CK-MB of 1, troponin at less than 0.05.  UA was negative.  CT scan of the head showed mild atrophy and small-vessel  disease without apparent acute abnormality.   ASSESSMENT AND PLAN:  1.  Amnesia, differential including transient global amnesia, seizures, and      possible transient ischemic attack.  At this time will order an MRI,      electroencephalogram.  Will need to call neurology for  consultation.  2.  Hyperlipidemia.  Continue his Zocor.  3.  Hypertension.  Will continue his Zestril.  4.  Prophylaxis.  He will be placed on Lovenox.      At[place]   AEJ/MEDQ  D:  02/26/2004  T:  02/26/2004  Job:  [id number]   cc:   Darletta Moll. Bubba Hales, M.D.  [address]  [place]  [place] [address]  Fax: [phone]

## 2010-07-10 NOTE — Discharge Summary (Signed)
Kenneth Williams, Kenneth Williams                ACCOUNT NO.:  [id number]   MEDICAL RECORD NO.:  [id number]          PATIENT TYPE:  INP   LOCATION:  [id number]                         FACILITY:  [facility]   PHYSICIAN:  Benito Mccreedy, M.D.DATE OF BIRTH:  Nov 05, 1935   DATE OF ADMISSION:  02/26/2004  DATE OF DISCHARGE:  02/27/2004                                 DISCHARGE SUMMARY   DISCHARGE DIAGNOSES:  1.  Presumptive transient global amnesia.      1.  MRI of the brain and EEG have been done.  Results are pending at          time of this dictation to rule out transient ischemic          attack/seizures.  2.  History of dyslipidemia.  3.  Hypertension.   DISCHARGE MEDICATIONS:  So far, the patient is to resume all his pre-  admission medications as he was taking them at home.   DISCHARGE LABORATORIES:  WBC count 10.0, hemoglobin 14.2, hematocrit 32.7,  MCV 82.8, platelet count 286,000.  Sodium 140, potassium 4.1, chloride 107,  CO2 26, glucose 93, BUN 13, creatinine 1.3, calcium 9.3.  TSH 0.070, (the  patient does not have any evidence of clinical hypothyroidism, in fact his  heart rate has been 66-73 during his hospitalization).  Outpatient free T3  and free T4 level is recommended.  This will be ordered and it will be  followed up in the outpatient level on discharge.   CONSULTANTS:  Neurology was consulted by Dr. Domingo Madeira, but  apparently has not seen the patient yet.  Will try and get them to see the  patient before discharge or at least get him an appointment to see him at  the outpatient level if unable to see him today.   PROCEDURE:  MRI and EEG as mentioned above.  Results are pending.   CONDITION ON DISCHARGE:  Improved and satisfactory.   REASON FOR HOSPITALIZATION:  Amnesia to rule out transient ischemic  attacks/seizures.  Please see full details of the patient's presentation as  dictated by Dr. Domingo Madeira on February 26, 2004.  Basically, the  patient was brought by his  family to the hospital because he could not  remember things.  At time of admission, according to Dr. Thomasenia Sales Joseph's  H&P, the patient was apparently alert and oriented x3.  His cardiopulmonary  exam was unremarkable and there was no obvious focal neurologic deficits.  The patient's chemistries are remarkable and a CT scan of the head showed  mild atrophy and small vessel disease without any acute abnormality.  He is  therefore admitted for further evaluation in this regard.   ON admission, the patient was put on IV fluids, normal saline, KVO and his  home medicines were also initiated.  TSH was done, results indicated low TSH  without any overt hypothyroidism as indicated above.  EEG and MRI was  ordered which were done this morning.  The results are also pending.   On rounds today, the patient feels fine.  He does not have any memory  problems.  He is alert and oriented 3, no acute cognitive deficits.  There  is no chest pain or shortness of breath.  The head is without visual  changes.  His lungs are clear to auscultation and cardiac auscultation was  unremarkable.  The abdomen is soft and nontender.  Bowel sounds are present.  He does not have any edema on extremity exam.  His vital signs:  Blood  pressure was 120/100 as before, respirations with 18, pulse 60, temperature  98.2 degrees Fahrenheit.  O2 saturation is 98% on room air.  The patient has  indicated to [place] that he wants to sign against medical advice because he lost  his insurance when he lost his job and discontinued his [employer] coverage about  10 days ago and therefore worried about hospital bills.  I assured him that  it would be possible to discharge him today and he has agreed to wait until  sometime this afternoon, waiting for the EEG and MRI report and possibly  neurology evaluation.  The patient may be discharged and hopefully these are  okay with outpatient follow up in terms of free T3 and free T4 results per   primary care physician.      Geor   GO/MEDQ  D:  02/27/2004  T:  02/27/2004  Job:  [id number]

## 2010-07-10 NOTE — Discharge Summary (Signed)
Kenneth Williams, Kenneth Williams                ACCOUNT NO.:  [id number]   MEDICAL RECORD NO.:  [id number]          PATIENT TYPE:  INP   LOCATION:  [id number]                         FACILITY:  [facility]   PHYSICIAN:  Benito Mccreedy, M.D.DATE OF BIRTH:  02-10-1936   DATE OF PROCEDURE:  DATE OF DISCHARGE:                      STAT - MUST CHANGE TO CORRECT WORK TYPE   ADDENDUM TO DISCHARGE SUMMARY:  Please note that the patient recently lost  his job.  Had some difficulties with his insurance.  He does not look very  anxious to me.  However, should both MRI and EEG be negative, I believe a  psychiatric evaluation possibly to look into possible somatoform disorder  would be appropriate.      Geor   GO/MEDQ  D:  02/27/2004  T:  02/27/2004  Job:  [id number]

## 2010-07-10 NOTE — Assessment & Plan Note (Signed)
[facility]                           PRIMARY CARE OFFICE NOTE   NAME:Kenneth Williams, Kenneth Williams                         MRN:          [id number]  DATE:06/09/2006                            DOB:          [birth year]    The patient is a 75 year old male, a new patient who presents to  establish primary with me and to address his chronic medical problems  including hypertension, dyslipidemia, and hyperthyroidism.   ALLERGIES:  None.   PAST MEDICAL HISTORY:  1. Diverticulitis in 2007.  2. Pancreatitis November 2002, related to gallstones status post      cholecystectomy.  3. Hypertension.  4. Dyslipidemia.  5. History of elevated PSA.  6. Transient global amnesia several years ago.   CURRENT MEDICATIONS:  1. Lisinopril HCT 20/12.5 two tablets daily.  2. Zocor 40 mg daily.  3. Baby aspirin daily.   SOCIAL HISTORY:  He is married.  His wife is my patient.  He does not  smoke or drink alcohol.   FAMILY HISTORY:  Mother died at the age of 26 of unknown cause.  Father  died at age of 18 of unknown cause.   REVIEW OF SYSTEMS:  No chest pain or shortness of breath.  No syncope.  No neurologic complaints.  No problems sleeping.  The rest of the 18  point review of systems is negative.   PHYSICAL EXAMINATION:  VITAL SIGNS:  Blood pressure 131/82.  Pulse 64.  Temperature 98.  Weight 200 pounds.  GENERAL:  He looks well.  He is in no acute distress.  HEENT:  Moist mucosa.  Neck is supple.  No thyromegaly or bruits.  LUNGS:  Clear.  No wheezes or rales.  HEART:  S1, S2, no murmur, no gallop.  ABDOMEN:  Soft, nontender.  No organomegaly, no masses.  EXTREMITIES:  Lower extremities without edema.  NEUROLOGIC:  He is alert, oriented, cooperative.  Denies being  depressed.  RECTAL:  Examination was deferred.  SKIN:  Clear with aging changes.   LABORATORY DATA:  Records reviewed.  He had colonoscopy in February  2007.  His PSA was 6.4 on October 08, 2004.   ASSESSMENT/PLAN:  1. Hypertension, controlled.  Continue current therapy.  Call with      problems.  Obtain lab work.  2. Dyslipidemia on therapy.  We will continue.  Obtain lipids and      liver tests.  3. Hyperthyroidism status post radioactive iodine treatment about a      year ago.  Currently, he has been euthyroid.  He has been seeing      Dr. Chalmers Cater regularly.  4. Elevated PSA.  He has been seeing Dr. Reece Agar on a regular basis,      advised to continue.  Check his PSA level.  5. History of transient ischemic attack/transient global amnesia.  He      was asked to restart baby aspirin, obtain ultrasound of the      carotids.  Advised to start exercising.  6. Vaccinations discussed including Pneumovax, Zostavax.  He will      think about them  and let me know next time.  I will see him back in      six months with lab working including vitamin D.     Evie Lacks. Plotnikov, MD  Electronically Signed    AVP/MedQ  DD: 06/10/2006  DT: 06/10/2006  Job #: [id number]   cc:   Jacelyn Pi, M.D.  Nelida Gores, M.D.

## 2010-07-10 NOTE — Procedures (Signed)
HISTORY:  This is a 75 year old gentleman with an episode of transient  amnesia.  Patient is being evaluated for transient global amnesias.  Medications include Zestril and Zocor.  This is a routine EEG.  No skull  defects were noted.   EEG CLASSIFICATION:  Normal awake.   DESCRIPTION:  According to background rhythms, this recording consists of a  fairly well modulated medium amplitude alpha rhythm of 9 Hz that is reactive  to eye opening and closure.  As the record progresses, the patient appears  to remain in an awakened state throughout the entirety of the recording.  Photic stimulation is performed, and there appears to be bilateral and  symmetric photo-driving response.  Hyperventilation is also performed,  resulting in a very minimal buildup of background rhythm activities without  significant slowing seen.  At no time during the recording did it appear to  be evidence of spike or spike wave discharges or abnormal focal slowing.  EKG monitor shows no evidence of cardiac arrhythmias with a heart rate of  72.   IMPRESSION:  This is a normal EEG recording in the awakened state.  No  evidence of ictal or interictal discharges were seen.      [id number]  D:  02/27/2004 15:00:27  T:  02/27/2004 15:28:03  Job #:  [id number]

## 2010-07-15 ENCOUNTER — Telehealth: Payer: Self-pay

## 2010-07-15 NOTE — Telephone Encounter (Signed)
Patient called [facility] stating that he was started on blood pressure medication and has noticed swelling x 1-2wks. He would like an appt to discuss with MD

## 2010-07-15 NOTE — Telephone Encounter (Signed)
Spoke with patient who c/o bilateral ffoot swelling. Patient added to schedule for this week. Will call back if unable to keep it

## 2010-07-17 ENCOUNTER — Encounter: Payer: Self-pay | Admitting: Internal Medicine

## 2010-07-17 ENCOUNTER — Ambulatory Visit: Payer: Medicare Other | Admitting: Internal Medicine

## 2010-07-17 ENCOUNTER — Ambulatory Visit (INDEPENDENT_AMBULATORY_CARE_PROVIDER_SITE_OTHER): Payer: Medicare Other | Admitting: Internal Medicine

## 2010-07-17 DIAGNOSIS — N259 Disorder resulting from impaired renal tubular function, unspecified: Secondary | ICD-10-CM

## 2010-07-17 DIAGNOSIS — R609 Edema, unspecified: Secondary | ICD-10-CM

## 2010-07-17 DIAGNOSIS — R413 Other amnesia: Secondary | ICD-10-CM

## 2010-07-17 MED ORDER — FUROSEMIDE 20 MG PO TABS: 20.0000 mg | ORAL_TABLET | ORAL | Status: DC

## 2010-07-17 MED ORDER — AMLODIPINE BESYLATE 5 MG PO TABS: 5.0000 mg | ORAL_TABLET | Freq: Every day | ORAL | Status: DC

## 2010-07-17 NOTE — Assessment & Plan Note (Signed)
Reduce Norvasc to 5 mg

## 2010-07-17 NOTE — Assessment & Plan Note (Signed)
We have been watching

## 2010-07-17 NOTE — Assessment & Plan Note (Signed)
On Rx 

## 2010-07-17 NOTE — Progress Notes (Signed)
  Subjective:    Patient ID: Kenneth Williams, male    DOB: 1935/11/20, 75 y.o.   MRN: [id number]  HPI  C/o LE swelling x 3 wks. F/u HTN and memory loss. Review of Systems  Constitutional: Negative for chills.  Respiratory: Negative for cough and shortness of breath.   Gastrointestinal: Negative for blood in stool.  Musculoskeletal: Negative for joint swelling.  Psychiatric/Behavioral: Negative for confusion and decreased concentration.       Objective:   Physical Exam  Constitutional: He is oriented to person, place, and time. He appears well-developed.  HENT:  Mouth/Throat: Oropharynx is clear and moist.  Eyes: Conjunctivae are normal. Pupils are equal, round, and reactive to light.  Neck: Normal range of motion. No JVD present. No thyromegaly present.  Cardiovascular: Normal rate, regular rhythm, normal heart sounds and intact distal pulses.  Exam reveals no gallop and no friction rub.   No murmur heard. Pulmonary/Chest: Effort normal and breath sounds normal. No respiratory distress. He has no wheezes. He has no rales. He exhibits no tenderness.  Abdominal: Soft. Bowel sounds are normal. He exhibits no distension and no mass. There is no tenderness. There is no rebound and no guarding.  Musculoskeletal: Normal range of motion. He exhibits edema (trace B). He exhibits no tenderness.  Lymphadenopathy:    He has no cervical adenopathy.  Neurological: He is alert and oriented to person, place, and time. He has normal reflexes. No cranial nerve deficit. He exhibits normal muscle tone. Coordination normal.  Skin: Skin is warm and dry. No rash noted.  Psychiatric: He has a normal mood and affect. His behavior is normal. Judgment and thought content normal.          Assessment & Plan:  Edema Reduce Norvasc to 5 mg  MEMORY LOSS On Rx  RENAL INSUFFICIENCY We have been watching  HTN -  better

## 2010-07-21 ENCOUNTER — Telehealth: Payer: Self-pay

## 2010-07-21 NOTE — Telephone Encounter (Signed)
[facility] Triage Call Report Triage Record Num: [id number] Operator: Wells Guiles Patient Name: Kenneth Williams Call Date & Time: 07/20/2010 11:17:56AM Patient Phone: [phone] PCP: Walker Kehr Patient Gender: Male PCP Fax : [phone] Patient DOB: [birth year] Practice Name: [facility] Reason for Call: Larena Sox, calling regarding [name] starting with pain in L big Toe joint onset 07/19/10. Last flair up of Gout was in 2010. Pain is all through the joint and hurts to stand, walk or put any pressure on toe. No swelling, redness or warmth. MD on call contacted, Dr. Scarlette Calico, and he advised to call in [place] .6mg s 1 tab PO TID x 7 days #40, no refills, and to f/u with office in 2 weeks or as needed if sx don't subside. Order called in to [facility] on [address]. [phone] and spoke to [place], [place]. Care advice per Foot Non Injury Protocol. Other. PCP is Plotnikov, Alex. Callback number is [phone]. Protocol(s) Used: Foot Non-Injury Recommended Outcome per Protocol: See Provider within 2 Weeks Reason for Outcome: Burning pain located in plantar (sole) area or ball of foot that may also involve toes; may also have tingling or numbness in toes. Care Advice: Apply cloth-covered ice pack or a cool compress to the area for no more than 20 minutes 4-8 times a day while awake to reduce pain and swelling. ~ ~ If symptoms continue or become more severe, see your provider for other possible treatments. ~ For a few weeks, avoid high impact exercise such as jogging, aerobic exercise or dancing. Use non-medicated pads to pad around the affected area. Wear shoes with a deep toe box. Avoid shoes with more than 2 inch heels, shoes that are too tight or too narrow. ~ ~ SYMPTOM / CONDITION MANAGEMENT ~ CAUTIONS Analgesic/Antipyretic Advice - Acetaminophen: Consider acetaminophen as directed on label or by pharmacist/provider for pain or fever PRECAUTIONS: - Use if there  is no history of liver disease, alcoholism, or intake of three or more alcohol drinks per day - Only if approved by provider during pregnancy or when breastfeeding - During pregnancy, acetaminophen should not be taken more than 3 consecutive days without telling provider - Do not exceed recommended dose or frequency ~ 07/20/2010 12:19:42PM Page 1 of 1 [email or web address]

## 2010-07-24 ENCOUNTER — Telehealth: Payer: Self-pay | Admitting: *Deleted

## 2010-07-24 NOTE — Telephone Encounter (Signed)
Spoke w/wife and pt. Pt has been taking colcrys all week w/no improvement in symptoms. C/o swelling and discomfort in foot that he thought was gout. Advised eval due to no improvement in symptoms and scheduled pt for sat clinic.

## 2010-07-25 ENCOUNTER — Encounter: Payer: Self-pay | Admitting: Family Medicine

## 2010-07-25 ENCOUNTER — Ambulatory Visit (INDEPENDENT_AMBULATORY_CARE_PROVIDER_SITE_OTHER): Payer: Medicare Other | Admitting: Family Medicine

## 2010-07-25 VITALS — BP 132/90 | Temp 97.9°F | Wt 210.1 lb

## 2010-07-25 DIAGNOSIS — T148XXA Other injury of unspecified body region, initial encounter: Secondary | ICD-10-CM

## 2010-07-25 DIAGNOSIS — M109 Gout, unspecified: Secondary | ICD-10-CM

## 2010-07-25 MED ORDER — PREDNISONE 20 MG PO TABS: ORAL_TABLET | ORAL | Status: DC

## 2010-07-25 NOTE — Progress Notes (Signed)
  Subjective:    Patient ID: Kenneth Williams, male    DOB: 31-Mar-1935, 75 y.o.   MRN: [id number]  HPI  75 yo WM presents for L foot pain.  He was has hx of gout, usually only 1 flare up/ yr.  He restarted colchicine a wk ago for a flare up in the L foot.  It is improving but he still has redness, swelling and pain.  Able to walk w/ some discomfort.  No F/C.  Has some loose stools from colcicine.  No foot trauma.  BP 132/90  Temp(Src) 97.9 F (36.6 C) (Oral)  Wt 210 lb 1.3 oz (95.292 kg)     Review of Systems as per HPI    Objective:   Physical Exam  Constitutional: He appears well-developed and well-nourished.  Cardiovascular: Normal rate, regular rhythm and normal heart sounds.        bilat pedal pulses 2+  Pulmonary/Chest: Effort normal and breath sounds normal.  Musculoskeletal: He exhibits edema.       L foot / ankle edema with redness and heat, tender 1st L MTP with hallux rigidus, R foot is normal          Assessment & Plan:

## 2010-07-25 NOTE — Assessment & Plan Note (Signed)
L foot gouty flare up after 1 wk with only partial improvement on colchicine and having loose stools.  Will change him to Prednsione taper 40-40-20-20-10-10 stop.  Call PCP if not seeing improvements next wk.  He will be due for a uric acid level after flare up has resolved.

## 2010-07-25 NOTE — Patient Instructions (Signed)
For gouty flare up, hold Colchicine.  Replace with Prednisone 40-40-20-20-10-10 stop.  This should take care of the redness and swelling in your L foot.  Call Dr Alain Marion to follow up gout/ uric acid in the next 3 wks.

## 2010-07-28 ENCOUNTER — Other Ambulatory Visit: Payer: Self-pay | Admitting: Internal Medicine

## 2010-07-28 DIAGNOSIS — I6529 Occlusion and stenosis of unspecified carotid artery: Secondary | ICD-10-CM

## 2010-07-29 ENCOUNTER — Other Ambulatory Visit: Payer: Self-pay | Admitting: *Deleted

## 2010-07-29 ENCOUNTER — Encounter (INDEPENDENT_AMBULATORY_CARE_PROVIDER_SITE_OTHER): Payer: Medicare Other | Admitting: *Deleted

## 2010-07-29 DIAGNOSIS — I6529 Occlusion and stenosis of unspecified carotid artery: Secondary | ICD-10-CM

## 2010-08-03 ENCOUNTER — Encounter: Payer: Self-pay | Admitting: Internal Medicine

## 2010-08-21 ENCOUNTER — Other Ambulatory Visit: Payer: Self-pay | Admitting: Internal Medicine

## 2010-08-21 NOTE — Telephone Encounter (Signed)
Ok to Rf? Med is not active.Marland KitchenMarland KitchenMarland KitchenReview sig.... "For 7 days"?

## 2010-08-22 ENCOUNTER — Telehealth: Payer: Self-pay | Admitting: *Deleted

## 2010-08-22 MED ORDER — COLCHICINE 0.6 MG PO TABS: 0.6000 mg | ORAL_TABLET | Freq: Every day | ORAL | Status: DC

## 2010-08-22 NOTE — Telephone Encounter (Signed)
Pt stopped by the office stating that he tried to get refill of Colcrys yesterday. There was no documentation of this request. Pt was advised that the office would refill the medication for limited supply only, but pt needs to make an appt with PCP as previously instructed on 07/25/10. Refill sent electronically.

## 2010-08-22 NOTE — Telephone Encounter (Signed)
Noted and agree. 

## 2010-08-23 ENCOUNTER — Other Ambulatory Visit: Payer: Self-pay | Admitting: Internal Medicine

## 2010-08-31 ENCOUNTER — Other Ambulatory Visit: Payer: Self-pay | Admitting: Internal Medicine

## 2010-08-31 ENCOUNTER — Other Ambulatory Visit: Payer: Medicare Other

## 2010-08-31 DIAGNOSIS — T887XXA Unspecified adverse effect of drug or medicament, initial encounter: Secondary | ICD-10-CM

## 2010-08-31 DIAGNOSIS — E782 Mixed hyperlipidemia: Secondary | ICD-10-CM

## 2010-09-02 ENCOUNTER — Other Ambulatory Visit: Payer: Self-pay | Admitting: *Deleted

## 2010-09-02 DIAGNOSIS — M109 Gout, unspecified: Secondary | ICD-10-CM

## 2010-09-02 NOTE — Progress Notes (Signed)
Per pt req

## 2010-09-04 ENCOUNTER — Other Ambulatory Visit (INDEPENDENT_AMBULATORY_CARE_PROVIDER_SITE_OTHER): Payer: Medicare Other

## 2010-09-04 DIAGNOSIS — T887XXA Unspecified adverse effect of drug or medicament, initial encounter: Secondary | ICD-10-CM

## 2010-09-04 DIAGNOSIS — E782 Mixed hyperlipidemia: Secondary | ICD-10-CM

## 2010-09-04 DIAGNOSIS — M109 Gout, unspecified: Secondary | ICD-10-CM

## 2010-09-04 DIAGNOSIS — R609 Edema, unspecified: Secondary | ICD-10-CM

## 2010-09-04 LAB — TSH: TSH: 2.23 u[IU]/mL (ref 0.35–5.50)

## 2010-09-04 LAB — COMPREHENSIVE METABOLIC PANEL
ALT: 28 U/L (ref 0–53)
Alkaline Phosphatase: 58 U/L (ref 39–117)
Creatinine, Ser: 1.2 mg/dL (ref 0.4–1.5)
Glucose, Bld: 91 mg/dL (ref 70–99)
Sodium: 139 mEq/L (ref 135–145)
Total Bilirubin: 0.5 mg/dL (ref 0.3–1.2)
Total Protein: 6.7 g/dL (ref 6.0–8.3)

## 2010-09-04 LAB — LIPID PANEL
Cholesterol: 148 mg/dL (ref 0–200)
Triglycerides: 135 mg/dL (ref 0.0–149.0)

## 2010-09-04 LAB — BASIC METABOLIC PANEL
CO2: 26 mEq/L (ref 19–32)
Calcium: 9.2 mg/dL (ref 8.4–10.5)
Chloride: 108 mEq/L (ref 96–112)
Glucose, Bld: 91 mg/dL (ref 70–99)
Sodium: 139 mEq/L (ref 135–145)

## 2010-09-04 LAB — HEPATIC FUNCTION PANEL
ALT: 28 U/L (ref 0–53)
AST: 20 U/L (ref 0–37)
Albumin: 3.8 g/dL (ref 3.5–5.2)

## 2010-09-07 ENCOUNTER — Encounter: Payer: Self-pay | Admitting: Internal Medicine

## 2010-09-07 ENCOUNTER — Ambulatory Visit (INDEPENDENT_AMBULATORY_CARE_PROVIDER_SITE_OTHER): Payer: Medicare Other | Admitting: Internal Medicine

## 2010-09-07 DIAGNOSIS — D485 Neoplasm of uncertain behavior of skin: Secondary | ICD-10-CM

## 2010-09-07 DIAGNOSIS — M79672 Pain in left foot: Secondary | ICD-10-CM

## 2010-09-07 DIAGNOSIS — R453 Demoralization and apathy: Secondary | ICD-10-CM

## 2010-09-07 DIAGNOSIS — M109 Gout, unspecified: Secondary | ICD-10-CM

## 2010-09-07 DIAGNOSIS — R609 Edema, unspecified: Secondary | ICD-10-CM

## 2010-09-07 DIAGNOSIS — M79609 Pain in unspecified limb: Secondary | ICD-10-CM

## 2010-09-07 HISTORY — DX: Neoplasm of uncertain behavior of skin: D48.5

## 2010-09-07 MED ORDER — BUPROPION HCL ER (SR) 100 MG PO TB12: 100.0000 mg | ORAL_TABLET | Freq: Two times a day (BID) | ORAL | Status: DC

## 2010-09-07 MED ORDER — INDOMETHACIN ER 75 MG PO CPCR: 75.0000 mg | ORAL_CAPSULE | Freq: Two times a day (BID) | ORAL | Status: DC

## 2010-09-07 NOTE — Patient Instructions (Signed)
Hold Colchicine Start Indocin, take it twice a day x 2 wks then as needed for pain

## 2010-09-07 NOTE — Assessment & Plan Note (Signed)
See Meds 

## 2010-09-07 NOTE — Assessment & Plan Note (Signed)
Skin bx 

## 2010-09-07 NOTE — Progress Notes (Signed)
  Subjective:    Patient ID: Kenneth Williams, male    DOB: [birth year], 75 y.o.   MRN: [id number]  HPI The patient presents for a follow-up of  chronic hypertension, chronic dyslipidemia, gout. Gout in L big toe is not controlled with medicines, hurts...     Review of Systems  Constitutional: Negative for appetite change, fatigue and unexpected weight change.  HENT: Negative for nosebleeds, congestion, sore throat, sneezing, trouble swallowing and neck pain.   Eyes: Negative for itching and visual disturbance.  Respiratory: Negative for cough.   Cardiovascular: Negative for chest pain, palpitations and leg swelling.  Gastrointestinal: Negative for nausea, diarrhea, blood in stool and abdominal distention.  Genitourinary: Negative for frequency and hematuria.  Musculoskeletal: Positive for arthralgias (L foot hurts at big toe base). Negative for back pain, joint swelling and gait problem.  Skin: Negative for rash.  Neurological: Negative for dizziness, tremors, speech difficulty and weakness.  Psychiatric/Behavioral: Negative for sleep disturbance, dysphoric mood and agitation. The patient is not nervous/anxious.        Objective:   Physical Exam  Constitutional: He is oriented to person, place, and time. He appears well-developed.  HENT:  Mouth/Throat: Oropharynx is clear and moist.  Eyes: Conjunctivae are normal. Pupils are equal, round, and reactive to light.  Neck: Normal range of motion. No JVD present. No thyromegaly present.  Cardiovascular: Normal rate, regular rhythm, normal heart sounds and intact distal pulses.  Exam reveals no gallop and no friction rub.   No murmur heard. Pulmonary/Chest: Effort normal and breath sounds normal. No respiratory distress. He has no wheezes. He has no rales. He exhibits no tenderness.  Abdominal: Soft. Bowel sounds are normal. He exhibits no distension and no mass. There is no tenderness. There is no rebound and no guarding.  Musculoskeletal:  Normal range of motion. He exhibits tenderness (L 1st MTP is tender more at the bottom). He exhibits no edema.  Lymphadenopathy:    He has no cervical adenopathy.  Neurological: He is alert and oriented to person, place, and time. He has normal reflexes. No cranial nerve deficit. He exhibits normal muscle tone. Coordination normal.  Skin: Skin is warm and dry. No rash noted.       Mole 11 mm L back  Psychiatric: He has a normal mood and affect. His behavior is normal. Judgment and thought content normal.          Assessment & Plan:

## 2010-09-07 NOTE — Assessment & Plan Note (Signed)
L foot w/trace edema now

## 2010-09-07 NOTE — Assessment & Plan Note (Signed)
We will try Wellbutrin - low

## 2010-09-07 NOTE — Assessment & Plan Note (Addendum)
Start Indocin Start Allopurinol  If not better

## 2010-09-10 ENCOUNTER — Ambulatory Visit (INDEPENDENT_AMBULATORY_CARE_PROVIDER_SITE_OTHER): Payer: Medicare Other | Admitting: Internal Medicine

## 2010-09-10 ENCOUNTER — Encounter: Payer: Self-pay | Admitting: Internal Medicine

## 2010-09-10 VITALS — BP 150/90 | HR 72 | Temp 98.1°F | Resp 16 | Wt 209.0 lb

## 2010-09-10 DIAGNOSIS — D485 Neoplasm of uncertain behavior of skin: Secondary | ICD-10-CM

## 2010-09-10 NOTE — Progress Notes (Signed)
  Subjective:    Patient ID: Kenneth Williams, male    DOB: [birth year], 75 y.o.   MRN: [id number]  HPI  Skin bx  Review of Systems     Objective:   Physical Exam       11x 9 mm Assessment & Plan:

## 2010-09-10 NOTE — Assessment & Plan Note (Addendum)
Procedure Note :     Procedure :  Skin biopsy   Indication:    Suspicious lesion(s)   Risks including unsuccessful procedure , bleeding, infection, bruising, scar, a need for another complete procedure and others were explained to the patient in detail as well as the benefits. Informed consent was obtained and signed.   The patient was placed in a decubitus position.  Lesion #1 on   Lower back  measuring  11x9   mm   Skin over lesion #1  was prepped with Betadine and alcohol  and anesthetized with 1 cc of 2% lidocaine and epinephrine, using a 25-gauge 1 inch needle.  Shave biopsy with a sterile Dermablade was carried out in the usual fashion. It was attempted to biopsy with  1 mm free margins.  Hyfrecator was used to destroy the rest of the lesion potentially left behind and for hemostasis. Band-Aid was applied with antibiotic ointment. Tol well. Compl none

## 2010-09-10 NOTE — Patient Instructions (Signed)
Postprocedure instructions :    A Band-Aid should be  changed twice daily. You can take a shower tomorrow.  Keep the wounds clean. You can wash them with liquid soap and water. Pat dry with gauze or a Kleenex tissue  Before applying antibiotic ointment and a Band-Aid.   You need to report immediately  if fever, chills or any signs of infection develop.    The biopsy results should be available in 1 -2 weeks. 

## 2010-09-23 ENCOUNTER — Encounter: Payer: Self-pay | Admitting: Internal Medicine

## 2010-10-27 ENCOUNTER — Ambulatory Visit (INDEPENDENT_AMBULATORY_CARE_PROVIDER_SITE_OTHER): Payer: Medicare Other | Admitting: Internal Medicine

## 2010-10-27 ENCOUNTER — Encounter: Payer: Self-pay | Admitting: Internal Medicine

## 2010-10-27 DIAGNOSIS — I10 Essential (primary) hypertension: Secondary | ICD-10-CM

## 2010-10-27 DIAGNOSIS — M1120 Other chondrocalcinosis, unspecified site: Secondary | ICD-10-CM

## 2010-10-27 DIAGNOSIS — N259 Disorder resulting from impaired renal tubular function, unspecified: Secondary | ICD-10-CM

## 2010-10-27 DIAGNOSIS — R413 Other amnesia: Secondary | ICD-10-CM

## 2010-10-27 DIAGNOSIS — E785 Hyperlipidemia, unspecified: Secondary | ICD-10-CM

## 2010-10-27 HISTORY — DX: Other chondrocalcinosis, unspecified site: M11.20

## 2010-10-27 MED ORDER — AMLODIPINE BESYLATE 10 MG PO TABS: 10.0000 mg | ORAL_TABLET | Freq: Every day | ORAL | Status: DC

## 2010-10-27 MED ORDER — GALANTAMINE HYDROBROMIDE 8 MG PO TABS: 8.0000 mg | ORAL_TABLET | Freq: Two times a day (BID) | ORAL | Status: DC

## 2010-10-27 NOTE — Assessment & Plan Note (Signed)
On Simvastatin 

## 2010-10-27 NOTE — Progress Notes (Signed)
  Subjective:    Patient ID: Kenneth Williams, male    DOB: 1935-08-31, 75 y.o.   MRN: [id number]  HPI  The patient presents for a follow-up of  chronic hypertension, chronic dyslipidemia, pseudogout and memory loss controlled better with medicines. BP at home has been elevated    Review of Systems  Constitutional: Negative for appetite change, fatigue and unexpected weight change.  HENT: Negative for nosebleeds, congestion, sore throat, sneezing, trouble swallowing and neck pain.   Eyes: Negative for itching and visual disturbance.  Respiratory: Negative for cough.   Cardiovascular: Negative for chest pain, palpitations and leg swelling.  Gastrointestinal: Negative for nausea, diarrhea, blood in stool and abdominal distention.  Genitourinary: Negative for frequency and hematuria.  Musculoskeletal: Negative for back pain, joint swelling and gait problem.  Skin: Negative for rash.  Neurological: Negative for dizziness, tremors, speech difficulty and weakness.  Psychiatric/Behavioral: Negative for suicidal ideas, confusion, sleep disturbance, dysphoric mood and agitation. The patient is nervous/anxious.        Objective:   Physical Exam  Constitutional: He is oriented to person, place, and time. He appears well-developed.  HENT:  Mouth/Throat: Oropharynx is clear and moist.  Eyes: Conjunctivae are normal. Pupils are equal, round, and reactive to light.  Neck: Normal range of motion. No JVD present. No thyromegaly present.  Cardiovascular: Normal rate, regular rhythm, normal heart sounds and intact distal pulses.  Exam reveals no gallop and no friction rub.   No murmur heard. Pulmonary/Chest: Effort normal and breath sounds normal. No respiratory distress. He has no wheezes. He has no rales. He exhibits no tenderness.  Abdominal: Soft. Bowel sounds are normal. He exhibits no distension and no mass. There is no tenderness. There is no rebound and no guarding.  Musculoskeletal: Normal  range of motion. He exhibits no edema and no tenderness.  Lymphadenopathy:    He has no cervical adenopathy.  Neurological: He is alert and oriented to person, place, and time. He has normal reflexes. No cranial nerve deficit. He exhibits normal muscle tone. Coordination normal.  Skin: Skin is warm and dry. No rash noted.  Psychiatric: He has a normal mood and affect. His behavior is normal. Judgment and thought content normal.  No leg edema today        Assessment & Plan:

## 2010-10-27 NOTE — Patient Instructions (Signed)
Increase Norvasc (amlodipine) to 7.5 or 10 mg/day. Watch for swelling

## 2010-10-27 NOTE — Assessment & Plan Note (Signed)
Continue with current prescription therapy as reflected on the Med list.  

## 2010-10-27 NOTE — Assessment & Plan Note (Addendum)
Increase Norvasc (amlodipine) to 7.5 or 10 mg/day. Watch for swelling

## 2010-10-27 NOTE — Assessment & Plan Note (Signed)
Monitoring labs 

## 2010-10-27 NOTE — Assessment & Plan Note (Signed)
Increase Razadine dose

## 2010-11-09 ENCOUNTER — Other Ambulatory Visit: Payer: Self-pay | Admitting: Internal Medicine

## 2010-12-25 ENCOUNTER — Other Ambulatory Visit: Payer: Self-pay | Admitting: Internal Medicine

## 2011-01-21 ENCOUNTER — Telehealth: Payer: Self-pay | Admitting: *Deleted

## 2011-01-21 DIAGNOSIS — R300 Dysuria: Secondary | ICD-10-CM

## 2011-01-21 NOTE — Telephone Encounter (Signed)
Pt left vm req to have UA checked prior to 02-12-11 OV. Order entered.

## 2011-01-27 ENCOUNTER — Ambulatory Visit: Payer: Medicare Other | Admitting: Internal Medicine

## 2011-02-11 ENCOUNTER — Other Ambulatory Visit (INDEPENDENT_AMBULATORY_CARE_PROVIDER_SITE_OTHER): Payer: Medicare Other

## 2011-02-11 DIAGNOSIS — R300 Dysuria: Secondary | ICD-10-CM

## 2011-02-11 LAB — URINALYSIS, ROUTINE W REFLEX MICROSCOPIC
Ketones, ur: NEGATIVE
Specific Gravity, Urine: >=1.03 (ref 1.000–1.030)
Total Protein, Urine: 100
Urine Glucose: NEGATIVE
pH: 6 (ref 5.0–8.0)

## 2011-02-12 ENCOUNTER — Ambulatory Visit (INDEPENDENT_AMBULATORY_CARE_PROVIDER_SITE_OTHER): Payer: Medicare Other | Admitting: Internal Medicine

## 2011-02-12 ENCOUNTER — Encounter: Payer: Self-pay | Admitting: Internal Medicine

## 2011-02-12 VITALS — BP 120/72 | HR 74 | Temp 97.5°F | Resp 16 | Wt 208.0 lb

## 2011-02-12 DIAGNOSIS — Z23 Encounter for immunization: Secondary | ICD-10-CM

## 2011-02-12 DIAGNOSIS — Z0000 Encounter for general adult medical examination without abnormal findings: Secondary | ICD-10-CM

## 2011-02-12 DIAGNOSIS — E039 Hypothyroidism, unspecified: Secondary | ICD-10-CM

## 2011-02-12 DIAGNOSIS — N400 Enlarged prostate without lower urinary tract symptoms: Secondary | ICD-10-CM

## 2011-02-12 DIAGNOSIS — E785 Hyperlipidemia, unspecified: Secondary | ICD-10-CM

## 2011-02-12 DIAGNOSIS — M1120 Other chondrocalcinosis, unspecified site: Secondary | ICD-10-CM

## 2011-02-12 DIAGNOSIS — I10 Essential (primary) hypertension: Secondary | ICD-10-CM

## 2011-02-12 MED ORDER — SILODOSIN 8 MG PO CAPS: 8.0000 mg | ORAL_CAPSULE | Freq: Every day | ORAL | Status: DC

## 2011-02-12 NOTE — Assessment & Plan Note (Signed)
Continue with current prescription therapy as reflected on the Med list.  

## 2011-02-12 NOTE — Assessment & Plan Note (Signed)
Resolved

## 2011-02-12 NOTE — Progress Notes (Signed)
  Subjective:    Patient ID: Kenneth Williams, male    DOB: 1935-03-09, 75 y.o.   MRN: [id number]  HPI  The patient is here to follow up on chronic memory loss, depression, anxiety,and chronic moderate BPH symptoms controlled partially with medicines, diet and exercise. He stopped Wellbutrin - it did not help...    Review of Systems  Constitutional: Negative for appetite change, fatigue and unexpected weight change.  HENT: Negative for nosebleeds, congestion, sore throat, sneezing, trouble swallowing and neck pain.   Eyes: Negative for itching and visual disturbance.  Respiratory: Negative for cough.   Cardiovascular: Negative for chest pain, palpitations and leg swelling.  Gastrointestinal: Negative for nausea, diarrhea, blood in stool and abdominal distention.  Genitourinary: Negative for frequency and hematuria.  Musculoskeletal: Negative for back pain, joint swelling and gait problem.  Skin: Negative for rash.  Neurological: Negative for dizziness, tremors, speech difficulty and weakness.  Psychiatric/Behavioral: Negative for sleep disturbance, dysphoric mood and agitation. The patient is not nervous/anxious.        Objective:   Physical Exam  Constitutional: He is oriented to person, place, and time. He appears well-developed.  HENT:  Mouth/Throat: Oropharynx is clear and moist.  Eyes: Conjunctivae are normal. Pupils are equal, round, and reactive to light.  Neck: Normal range of motion. No JVD present. No thyromegaly present.  Cardiovascular: Normal rate, regular rhythm, normal heart sounds and intact distal pulses.  Exam reveals no gallop and no friction rub.   No murmur heard. Pulmonary/Chest: Effort normal and breath sounds normal. No respiratory distress. He has no wheezes. He has no rales. He exhibits no tenderness.  Abdominal: Soft. Bowel sounds are normal. He exhibits no distension and no mass. There is no tenderness. There is no rebound and no guarding.  Genitourinary:  Rectum normal. Guaiac negative stool.       prostae is 1+ smooth  Musculoskeletal: Normal range of motion. He exhibits no edema and no tenderness.  Lymphadenopathy:    He has no cervical adenopathy.  Neurological: He is alert and oriented to person, place, and time. He has normal reflexes. No cranial nerve deficit. He exhibits normal muscle tone. Coordination normal.  Skin: Skin is warm and dry. No rash noted.  Psychiatric: He has a normal mood and affect. His behavior is normal. Judgment and thought content normal.   UA was ok       Assessment & Plan:

## 2011-02-12 NOTE — Assessment & Plan Note (Addendum)
strart  prescription therapy - samples- as reflected on the Med list.

## 2011-04-16 ENCOUNTER — Telehealth: Payer: Self-pay | Admitting: Internal Medicine

## 2011-04-16 NOTE — Telephone Encounter (Signed)
OV w/any MD ?sat clinic Thx

## 2011-04-16 NOTE — Telephone Encounter (Signed)
The pt has refused an appt with another md and an appt with the Saturday clinic.  The pt stated he will call Monday am at 8am for a same day appt with Dr.Plotnikov

## 2011-04-16 NOTE — Telephone Encounter (Signed)
The pt called and is hoping to get worked in for an appt for swelling in his ankles.  Please advise if this is okay.  Thanks!

## 2011-04-19 ENCOUNTER — Encounter: Payer: Self-pay | Admitting: Internal Medicine

## 2011-04-19 ENCOUNTER — Ambulatory Visit (INDEPENDENT_AMBULATORY_CARE_PROVIDER_SITE_OTHER): Payer: Medicare Other | Admitting: Internal Medicine

## 2011-04-19 DIAGNOSIS — R609 Edema, unspecified: Secondary | ICD-10-CM

## 2011-04-19 DIAGNOSIS — I10 Essential (primary) hypertension: Secondary | ICD-10-CM

## 2011-04-19 MED ORDER — AMLODIPINE BESYLATE 5 MG PO TABS: 5.0000 mg | ORAL_TABLET | Freq: Every day | ORAL | Status: DC

## 2011-04-19 NOTE — Assessment & Plan Note (Signed)
Due to 10 mg Norvasc ( was OK on 5 mg) - he went back to 10 mg/d Reduce to 5 mg/d

## 2011-04-19 NOTE — Assessment & Plan Note (Signed)
See meds 

## 2011-04-19 NOTE — Patient Instructions (Signed)
You can use Furosemide 20-40 mg a day as needed Use compression socks

## 2011-04-19 NOTE — Progress Notes (Signed)
Patient ID: Kenneth Williams, male   DOB: 02-24-1935, 76 y.o.   MRN: [id number]  Subjective:    Patient ID: Kenneth Williams, male    DOB: [birth year], 76 y.o.   MRN: [id number]  HPI C/o edema B legs - worse The patient is here to follow up on chronic memory loss, moderate BPH symptoms, HTN, controlled partially with medicines, diet and exercise. He stopped Wellbutrin  He is back on 10 mg Norvasc   Wt Readings from Last 3 Encounters:  04/19/11 211 lb (95.709 kg)  02/12/11 208 lb (94.348 kg)  10/27/10 205 lb (92.987 kg)   BP Readings from Last 3 Encounters:  04/19/11 140/78  02/12/11 120/72  10/27/10 170/92     Review of Systems  Constitutional: Negative for appetite change, fatigue and unexpected weight change.  HENT: Negative for nosebleeds, congestion, sore throat, sneezing, trouble swallowing and neck pain.   Eyes: Negative for itching and visual disturbance.  Respiratory: Negative for cough.   Cardiovascular: Negative for chest pain, palpitations and leg swelling.  Gastrointestinal: Negative for nausea, diarrhea, blood in stool and abdominal distention.  Genitourinary: Negative for frequency and hematuria.  Musculoskeletal: Negative for back pain, joint swelling and gait problem.  Skin: Negative for rash.  Neurological: Negative for dizziness, tremors, speech difficulty and weakness.  Psychiatric/Behavioral: Negative for sleep disturbance, dysphoric mood and agitation. The patient is not nervous/anxious.        Objective:   Physical Exam  Constitutional: He is oriented to person, place, and time. He appears well-developed.  HENT:  Mouth/Throat: Oropharynx is clear and moist.  Eyes: Conjunctivae are normal. Pupils are equal, round, and reactive to light.  Neck: Normal range of motion. No JVD present. No thyromegaly present.  Cardiovascular: Normal rate, regular rhythm, normal heart sounds and intact distal pulses.  Exam reveals no gallop and no friction rub.   No murmur  heard. Pulmonary/Chest: Effort normal and breath sounds normal. No respiratory distress. He has no wheezes. He has no rales. He exhibits no tenderness.  Abdominal: Soft. Bowel sounds are normal. He exhibits no distension and no mass. There is no tenderness. There is no rebound and no guarding.  Genitourinary: Rectum normal.  Musculoskeletal: Normal range of motion. He exhibits edema (1+ B). He exhibits no tenderness.  Lymphadenopathy:    He has no cervical adenopathy.  Neurological: He is alert and oriented to person, place, and time. He has normal reflexes. No cranial nerve deficit. He exhibits normal muscle tone. Coordination normal.  Skin: Skin is warm and dry. No rash noted.  Psychiatric: He has a normal mood and affect. His behavior is normal. Judgment and thought content normal.   UA was ok       Assessment & Plan:

## 2011-04-24 ENCOUNTER — Other Ambulatory Visit: Payer: Self-pay | Admitting: Internal Medicine

## 2011-05-18 ENCOUNTER — Other Ambulatory Visit (INDEPENDENT_AMBULATORY_CARE_PROVIDER_SITE_OTHER): Payer: Medicare Other

## 2011-05-18 DIAGNOSIS — N400 Enlarged prostate without lower urinary tract symptoms: Secondary | ICD-10-CM

## 2011-05-18 DIAGNOSIS — Z125 Encounter for screening for malignant neoplasm of prostate: Secondary | ICD-10-CM

## 2011-05-18 DIAGNOSIS — I10 Essential (primary) hypertension: Secondary | ICD-10-CM

## 2011-05-18 DIAGNOSIS — E785 Hyperlipidemia, unspecified: Secondary | ICD-10-CM

## 2011-05-18 DIAGNOSIS — Z0000 Encounter for general adult medical examination without abnormal findings: Secondary | ICD-10-CM

## 2011-05-18 DIAGNOSIS — Z23 Encounter for immunization: Secondary | ICD-10-CM

## 2011-05-18 DIAGNOSIS — M1120 Other chondrocalcinosis, unspecified site: Secondary | ICD-10-CM

## 2011-05-18 DIAGNOSIS — E039 Hypothyroidism, unspecified: Secondary | ICD-10-CM

## 2011-05-18 LAB — CBC WITH DIFFERENTIAL/PLATELET
Basophils Absolute: 0 10*3/uL (ref 0.0–0.1)
Eosinophils Absolute: 0.4 10*3/uL (ref 0.0–0.7)
Eosinophils Relative: 4.1 % (ref 0.0–5.0)
HCT: 44.7 % (ref 39.0–52.0)
Lymphs Abs: 3 10*3/uL (ref 0.7–4.0)
MCV: 86.7 fl (ref 78.0–100.0)
Monocytes Absolute: 0.8 10*3/uL (ref 0.1–1.0)
Neutrophils Relative %: 57.3 % (ref 43.0–77.0)
Platelets: 249 10*3/uL (ref 150.0–400.0)
RDW: 13 % (ref 11.5–14.6)
WBC: 10.1 10*3/uL (ref 4.5–10.5)

## 2011-05-18 LAB — LIPID PANEL
Cholesterol: 170 mg/dL (ref 0–200)
LDL Cholesterol: 107 mg/dL — ABNORMAL HIGH (ref 0–99)
Triglycerides: 108 mg/dL (ref 0.0–149.0)
VLDL: 21.6 mg/dL (ref 0.0–40.0)

## 2011-05-18 LAB — BASIC METABOLIC PANEL
BUN: 14 mg/dL (ref 6–23)
Chloride: 104 mEq/L (ref 96–112)
Creatinine, Ser: 1.4 mg/dL (ref 0.4–1.5)
GFR: 53.12 mL/min — ABNORMAL LOW (ref >60.00)
Glucose, Bld: 80 mg/dL (ref 70–99)
Potassium: 4.6 mEq/L (ref 3.5–5.1)

## 2011-05-18 LAB — HEPATIC FUNCTION PANEL
Albumin: 3.7 g/dL (ref 3.5–5.2)
Total Bilirubin: 0.8 mg/dL (ref 0.3–1.2)

## 2011-05-18 LAB — URINALYSIS, ROUTINE W REFLEX MICROSCOPIC
Bilirubin Urine: NEGATIVE
Leukocytes, UA: NEGATIVE
Nitrite: NEGATIVE
Urobilinogen, UA: 0.2 (ref 0.0–1.0)

## 2011-05-18 LAB — PSA: PSA: 5.53 ng/mL — ABNORMAL HIGH (ref 0.10–4.00)

## 2011-05-19 ENCOUNTER — Encounter: Payer: Medicare Other | Admitting: Internal Medicine

## 2011-05-24 ENCOUNTER — Ambulatory Visit (INDEPENDENT_AMBULATORY_CARE_PROVIDER_SITE_OTHER): Payer: Medicare Other | Admitting: Internal Medicine

## 2011-05-24 ENCOUNTER — Encounter: Payer: Self-pay | Admitting: Internal Medicine

## 2011-05-24 VITALS — BP 128/70 | HR 81 | Temp 98.6°F | Wt 211.8 lb

## 2011-05-24 DIAGNOSIS — H9190 Unspecified hearing loss, unspecified ear: Secondary | ICD-10-CM

## 2011-05-24 DIAGNOSIS — Z0000 Encounter for general adult medical examination without abnormal findings: Secondary | ICD-10-CM

## 2011-05-24 DIAGNOSIS — I10 Essential (primary) hypertension: Secondary | ICD-10-CM

## 2011-05-24 DIAGNOSIS — N400 Enlarged prostate without lower urinary tract symptoms: Secondary | ICD-10-CM

## 2011-05-24 DIAGNOSIS — R413 Other amnesia: Secondary | ICD-10-CM

## 2011-05-24 DIAGNOSIS — N259 Disorder resulting from impaired renal tubular function, unspecified: Secondary | ICD-10-CM

## 2011-05-24 DIAGNOSIS — E875 Hyperkalemia: Secondary | ICD-10-CM

## 2011-05-24 DIAGNOSIS — R972 Elevated prostate specific antigen [PSA]: Secondary | ICD-10-CM

## 2011-05-24 DIAGNOSIS — H6120 Impacted cerumen, unspecified ear: Secondary | ICD-10-CM

## 2011-05-24 DIAGNOSIS — K635 Polyp of colon: Secondary | ICD-10-CM

## 2011-05-24 MED ORDER — FUROSEMIDE 20 MG PO TABS: 20.0000 mg | ORAL_TABLET | ORAL | Status: DC

## 2011-05-24 MED ORDER — LEVOTHYROXINE SODIUM 50 MCG PO TABS: 50.0000 ug | ORAL_TABLET | Freq: Every day | ORAL | Status: DC

## 2011-05-24 MED ORDER — GALANTAMINE HYDROBROMIDE 8 MG PO TABS: 8.0000 mg | ORAL_TABLET | Freq: Two times a day (BID) | ORAL | Status: DC

## 2011-05-24 MED ORDER — AMLODIPINE BESYLATE 5 MG PO TABS: 5.0000 mg | ORAL_TABLET | Freq: Every day | ORAL | Status: DC

## 2011-05-24 MED ORDER — LOSARTAN POTASSIUM 100 MG PO TABS: 100.0000 mg | ORAL_TABLET | Freq: Every day | ORAL | Status: DC

## 2011-05-24 MED ORDER — SIMVASTATIN 40 MG PO TABS: 40.0000 mg | ORAL_TABLET | Freq: Every day | ORAL | Status: DC

## 2011-05-24 NOTE — Assessment & Plan Note (Addendum)
He did not take Rapaflo Urol cons Dr Rosana Hoes

## 2011-05-24 NOTE — Assessment & Plan Note (Signed)
Watching  

## 2011-05-24 NOTE — Progress Notes (Signed)
Patient ID: Kenneth Williams, male   DOB: Jul 08, 1935, 76 y.o.   MRN: [id number] Patient ID: Kenneth Williams, male   DOB: [birth year], 76 y.o.   MRN: [id number]  Subjective:    Patient ID: Kenneth Williams, male    DOB: 09-Jan-1936, 76 y.o.   MRN: [id number]  HPI  The patient is here for a wellness exam. The patient has been doing well overall without major physical or psychological issues going on lately.  C/o edema B legs - better The patient is here to follow up on chronic memory loss, moderate BPH symptoms, HTN, controlled partially with medicines, diet and exercise. He stopped Wellbutrin  He is on 5 mg Norvasc   Wt Readings from Last 3 Encounters:  05/24/11 211 lb 12 oz (96.049 kg)  04/19/11 211 lb (95.709 kg)  02/12/11 208 lb (94.348 kg)   BP Readings from Last 3 Encounters:  05/24/11 128/70  04/19/11 140/78  02/12/11 120/72     Review of Systems  Constitutional: Negative for appetite change, fatigue and unexpected weight change.  HENT: Positive for hearing loss. Negative for ear pain, nosebleeds, congestion, sore throat, sneezing, trouble swallowing, neck pain, neck stiffness and ear discharge.   Eyes: Negative for itching and visual disturbance.  Respiratory: Negative for cough.   Cardiovascular: Negative for chest pain, palpitations and leg swelling.  Gastrointestinal: Negative for nausea, diarrhea, blood in stool and abdominal distention.  Genitourinary: Positive for decreased urine volume. Negative for frequency and hematuria.  Musculoskeletal: Negative for back pain, joint swelling and gait problem.  Skin: Negative for rash.  Neurological: Negative for dizziness, tremors, speech difficulty and weakness.  Psychiatric/Behavioral: Negative for suicidal ideas, sleep disturbance, dysphoric mood and agitation. The patient is not nervous/anxious.        Objective:   Physical Exam  Constitutional: He is oriented to person, place, and time. He appears well-developed.  HENT:  Head:  Normocephalic and atraumatic.  Mouth/Throat: Oropharynx is clear and moist. No oropharyngeal exudate.  Eyes: Conjunctivae are normal. Pupils are equal, round, and reactive to light.  Neck: Normal range of motion. No JVD present. No tracheal deviation present. No thyromegaly present.  Cardiovascular: Normal rate, regular rhythm, normal heart sounds and intact distal pulses.  Exam reveals no gallop and no friction rub.   No murmur heard. Pulmonary/Chest: Effort normal and breath sounds normal. No respiratory distress. He has no wheezes. He has no rales. He exhibits no tenderness.  Abdominal: Soft. Bowel sounds are normal. He exhibits no distension and no mass. There is no tenderness. There is no rebound and no guarding.  Genitourinary: Rectum normal.  Musculoskeletal: Normal range of motion. He exhibits no edema and no tenderness.  Lymphadenopathy:    He has no cervical adenopathy.  Neurological: He is alert and oriented to person, place, and time. He has normal reflexes. No cranial nerve deficit. He exhibits normal muscle tone. Coordination normal.  Skin: Skin is warm and dry. No rash noted.  Psychiatric: He has a normal mood and affect. His behavior is normal. Judgment and thought content normal.  R wax BP rechecked 155/85  Prostate exam was deferred by the pt (we did it recently)  Lab Results  Component Value Date   WBC 10.1 05/18/2011   HGB 14.7 05/18/2011   HCT 44.7 05/18/2011   PLT 249.0 05/18/2011   GLUCOSE 80 05/18/2011   CHOL 170 05/18/2011   TRIG 108.0 05/18/2011   HDL 41.30 05/18/2011   LDLDIRECT 114.5 05/04/2010   LDLCALC  107* 05/18/2011   ALT 28 05/18/2011   AST 26 05/18/2011   NA 137 05/18/2011   K 4.6 05/18/2011   CL 104 05/18/2011   CREATININE 1.4 05/18/2011   BUN 14 05/18/2011   CO2 23 05/18/2011   TSH 2.21 05/18/2011   PSA 5.53* 05/18/2011   INR 1.0 ratio 02/25/2009         Assessment & Plan:

## 2011-05-24 NOTE — Assessment & Plan Note (Signed)
S/p R hearing aid

## 2011-05-24 NOTE — Assessment & Plan Note (Signed)
Continue with current prescription therapy as reflected on the Med list.  

## 2011-05-24 NOTE — Assessment & Plan Note (Signed)
Recurrent Used to be watched by Dr Humphrey's Recheck in 6 mo

## 2011-05-24 NOTE — Assessment & Plan Note (Signed)
The patient is here for annual Medicare wellness examination and management of other chronic and acute problems.   The risk factors are reflected in the social history.  The roster of all physicians providing medical care to patient - is listed in the Snapshot section of the chart.  Activities of daily living:  The patient is 100% inedpendent in all ADLs: dressing, toileting, feeding as well as independent mobility  Home safety : The patient has smoke detectors in the home. They wear seatbelts.No firearms at home ( firearms are present in the home, kept in a safe fashion). There is no violence in the home.   There is no risks for hepatitis, STDs or HIV. There is no   history of blood transfusion. They have no travel history to infectious disease endemic areas of the world.  The patient has (has not) seen their dentist in the last six month. They have (not) seen their eye doctor in the last year. They deny (admit to) any hearing difficulty and have not had audiologic testing in the last year.  They do not  have excessive sun exposure. Discussed the need for sun protection: hats, long sleeves and use of sunscreen if there is significant sun exposure.   Diet: the importance of a healthy diet is discussed. They do have a healthy (unhealthy-high fat/fast food) diet.  The patient has a regular exercise program:no  The benefits of regular aerobic exercise were discussed.  Depression screen: there are no signs or vegative symptoms of depression- irritability, change in appetite, anhedonia, sadness/tearfullness.  Cognitive assessment: the patient manages all their financial and personal affairs and is actively engaged. They could relate day,date,year and events; recalled 3/3 objects at 3 minutes; performed clock-face test normally.  The following portions of the patient's history were reviewed and updated as appropriate: allergies, current medications, past family history, past medical history,  past  surgical history, past social history  and problem list.  Vision, hearing, body mass index were assessed and reviewed.   During the course of the visit the patient was educated and counseled about appropriate screening and preventive services including : fall prevention , diabetes screening, nutrition counseling, colorectal cancer screening, and recommended immunizations.

## 2011-05-24 NOTE — Assessment & Plan Note (Signed)
He wishes to irrigate it at home

## 2011-05-28 ENCOUNTER — Other Ambulatory Visit: Payer: Self-pay | Admitting: Internal Medicine

## 2011-05-30 ENCOUNTER — Encounter: Payer: Self-pay | Admitting: Internal Medicine

## 2011-06-02 ENCOUNTER — Other Ambulatory Visit: Payer: Self-pay | Admitting: *Deleted

## 2011-06-02 MED ORDER — LOSARTAN POTASSIUM 100 MG PO TABS: 100.0000 mg | ORAL_TABLET | Freq: Every day | ORAL | Status: DC

## 2011-06-02 MED ORDER — GALANTAMINE HYDROBROMIDE 8 MG PO TABS: 8.0000 mg | ORAL_TABLET | Freq: Two times a day (BID) | ORAL | Status: DC

## 2011-06-04 ENCOUNTER — Other Ambulatory Visit: Payer: Self-pay | Admitting: *Deleted

## 2011-06-04 MED ORDER — LEVOTHYROXINE SODIUM 50 MCG PO TABS: 50.0000 ug | ORAL_TABLET | Freq: Every day | ORAL | Status: DC

## 2011-06-04 MED ORDER — AMLODIPINE BESYLATE 5 MG PO TABS: 5.0000 mg | ORAL_TABLET | Freq: Every day | ORAL | Status: DC

## 2011-06-04 NOTE — Progress Notes (Signed)
Addended by: Cresenciano Lick on: 06/04/2011 01:30 PM   Modules accepted: Orders

## 2011-06-08 ENCOUNTER — Encounter: Payer: Self-pay | Admitting: Gastroenterology

## 2011-06-28 ENCOUNTER — Telehealth: Payer: Self-pay | Admitting: *Deleted

## 2011-06-28 MED ORDER — CARVEDILOL 12.5 MG PO TABS: 12.5000 mg | ORAL_TABLET | Freq: Two times a day (BID) | ORAL | Status: DC

## 2011-06-28 NOTE — Telephone Encounter (Signed)
Pt walked in stating his BP is being elevated at 180/80. Per Dr. Alain Marion- he wants to add Coreg 12.5 1 po bid # 60 with 11 Rf and keep ROV as scheduled. Pt informed

## 2011-07-09 ENCOUNTER — Encounter: Payer: Self-pay | Admitting: Gastroenterology

## 2011-07-09 ENCOUNTER — Ambulatory Visit (AMBULATORY_SURGERY_CENTER): Payer: Medicare Other | Admitting: *Deleted

## 2011-07-09 VITALS — Ht 74.0 in | Wt 209.7 lb

## 2011-07-09 DIAGNOSIS — Z1211 Encounter for screening for malignant neoplasm of colon: Secondary | ICD-10-CM

## 2011-07-09 MED ORDER — PEG-KCL-NACL-NASULF-NA ASC-C 100 G PO SOLR: ORAL | Status: DC

## 2011-07-23 ENCOUNTER — Other Ambulatory Visit: Payer: Medicare Other | Admitting: Gastroenterology

## 2011-08-20 ENCOUNTER — Encounter: Payer: Self-pay | Admitting: Internal Medicine

## 2011-08-20 ENCOUNTER — Ambulatory Visit (INDEPENDENT_AMBULATORY_CARE_PROVIDER_SITE_OTHER): Payer: Medicare Other | Admitting: Internal Medicine

## 2011-08-20 VITALS — BP 150/108 | HR 72 | Temp 97.5°F | Resp 16 | Wt 212.0 lb

## 2011-08-20 DIAGNOSIS — R413 Other amnesia: Secondary | ICD-10-CM

## 2011-08-20 DIAGNOSIS — E039 Hypothyroidism, unspecified: Secondary | ICD-10-CM

## 2011-08-20 DIAGNOSIS — I10 Essential (primary) hypertension: Secondary | ICD-10-CM

## 2011-08-20 DIAGNOSIS — E785 Hyperlipidemia, unspecified: Secondary | ICD-10-CM

## 2011-08-20 DIAGNOSIS — R5381 Other malaise: Secondary | ICD-10-CM

## 2011-08-20 MED ORDER — NEBIVOLOL HCL 10 MG PO TABS: 10.0000 mg | ORAL_TABLET | Freq: Every day | ORAL | Status: DC

## 2011-08-20 NOTE — Progress Notes (Signed)
Subjective:    Patient ID: Kenneth Williams, male    DOB: 27-Oct-1935, 76 y.o.   MRN: [id number]  Hypertension Pertinent negatives include no chest pain, neck pain or palpitations.   C/o diarrhea and fatigue w/Coreg   F/u on edema B legs - better The patient is here to follow up on chronic memory loss, moderate BPH symptoms, HTN, controlled partially with medicines, diet and exercise.   Wt Readings from Last 3 Encounters:  08/20/11 212 lb (96.163 kg)  07/09/11 209 lb 11.2 oz (95.119 kg)  05/24/11 211 lb 12 oz (96.049 kg)   BP Readings from Last 3 Encounters:  08/20/11 150/108  05/24/11 128/70  04/19/11 140/78     Review of Systems  Constitutional: Negative for appetite change, fatigue and unexpected weight change.  HENT: Positive for hearing loss. Negative for ear pain, nosebleeds, congestion, sore throat, sneezing, trouble swallowing, neck pain, neck stiffness and ear discharge.   Eyes: Negative for itching and visual disturbance.  Respiratory: Negative for cough.   Cardiovascular: Negative for chest pain, palpitations and leg swelling.  Gastrointestinal: Negative for nausea, diarrhea, blood in stool and abdominal distention.  Genitourinary: Positive for decreased urine volume. Negative for frequency and hematuria.  Musculoskeletal: Negative for back pain, joint swelling and gait problem.  Skin: Negative for rash.  Neurological: Negative for dizziness, tremors, speech difficulty and weakness.  Psychiatric/Behavioral: Negative for suicidal ideas, disturbed wake/sleep cycle, dysphoric mood and agitation. The patient is not nervous/anxious.        Objective:   Physical Exam  Constitutional: He is oriented to person, place, and time. He appears well-developed.  HENT:  Head: Normocephalic and atraumatic.  Mouth/Throat: Oropharynx is clear and moist. No oropharyngeal exudate.  Eyes: Conjunctivae are normal. Pupils are equal, round, and reactive to light.  Neck: Normal range  of motion. No JVD present. No tracheal deviation present. No thyromegaly present.  Cardiovascular: Normal rate, regular rhythm, normal heart sounds and intact distal pulses.  Exam reveals no gallop and no friction rub.   No murmur heard. Pulmonary/Chest: Effort normal and breath sounds normal. No respiratory distress. He has no wheezes. He has no rales. He exhibits no tenderness.  Abdominal: Soft. Bowel sounds are normal. He exhibits no distension and no mass. There is no tenderness. There is no rebound and no guarding.  Genitourinary: Rectum normal.  Musculoskeletal: Normal range of motion. He exhibits no edema and no tenderness.  Lymphadenopathy:    He has no cervical adenopathy.  Neurological: He is alert and oriented to person, place, and time. He has normal reflexes. No cranial nerve deficit. He exhibits normal muscle tone. Coordination normal.  Skin: Skin is warm and dry. No rash noted.  Psychiatric: He has a normal mood and affect. His behavior is normal. Judgment and thought content normal.  HR is ok  Prostate exam was deferred by the pt (we did it recently)  Lab Results  Component Value Date   WBC 10.1 05/18/2011   HGB 14.7 05/18/2011   HCT 44.7 05/18/2011   PLT 249.0 05/18/2011   GLUCOSE 80 05/18/2011   CHOL 170 05/18/2011   TRIG 108.0 05/18/2011   HDL 41.30 05/18/2011   LDLDIRECT 114.5 05/04/2010   LDLCALC 107* 05/18/2011   ALT 28 05/18/2011   AST 26 05/18/2011   NA 137 05/18/2011   K 4.6 05/18/2011   CL 104 05/18/2011   CREATININE 1.4 05/18/2011   BUN 14 05/18/2011   CO2 23 05/18/2011   TSH 2.21 05/18/2011  PSA 5.53* 05/18/2011   INR 1.0 ratio 02/25/2009         Assessment & Plan:

## 2011-08-20 NOTE — Assessment & Plan Note (Signed)
Continue with current prescription therapy as reflected on the Med list.  

## 2011-08-20 NOTE — Assessment & Plan Note (Signed)
Chronic Multiple Rx intolerances We will try Bystolic at a low dose

## 2011-08-20 NOTE — Assessment & Plan Note (Signed)
Discussed.

## 2011-08-20 NOTE — Patient Instructions (Signed)
Start Bystolic 2.5 mg a day and progress to 10 mg a day if no side effects (goal BP<130/85)

## 2011-09-06 ENCOUNTER — Ambulatory Visit: Payer: Medicare Other | Admitting: Internal Medicine

## 2011-11-16 ENCOUNTER — Other Ambulatory Visit: Payer: Self-pay | Admitting: Internal Medicine

## 2011-11-23 ENCOUNTER — Ambulatory Visit (INDEPENDENT_AMBULATORY_CARE_PROVIDER_SITE_OTHER): Payer: Medicare Other | Admitting: Internal Medicine

## 2011-11-23 ENCOUNTER — Encounter: Payer: Self-pay | Admitting: Internal Medicine

## 2011-11-23 ENCOUNTER — Other Ambulatory Visit (INDEPENDENT_AMBULATORY_CARE_PROVIDER_SITE_OTHER): Payer: Medicare Other

## 2011-11-23 VITALS — BP 140/100 | HR 80 | Temp 98.2°F | Resp 16 | Wt 210.0 lb

## 2011-11-23 DIAGNOSIS — R609 Edema, unspecified: Secondary | ICD-10-CM

## 2011-11-23 DIAGNOSIS — N400 Enlarged prostate without lower urinary tract symptoms: Secondary | ICD-10-CM

## 2011-11-23 DIAGNOSIS — Z23 Encounter for immunization: Secondary | ICD-10-CM

## 2011-11-23 DIAGNOSIS — Z8679 Personal history of other diseases of the circulatory system: Secondary | ICD-10-CM

## 2011-11-23 DIAGNOSIS — D649 Anemia, unspecified: Secondary | ICD-10-CM

## 2011-11-23 DIAGNOSIS — N259 Disorder resulting from impaired renal tubular function, unspecified: Secondary | ICD-10-CM

## 2011-11-23 DIAGNOSIS — R197 Diarrhea, unspecified: Secondary | ICD-10-CM | POA: Insufficient documentation

## 2011-11-23 DIAGNOSIS — E039 Hypothyroidism, unspecified: Secondary | ICD-10-CM

## 2011-11-23 DIAGNOSIS — R413 Other amnesia: Secondary | ICD-10-CM

## 2011-11-23 DIAGNOSIS — I10 Essential (primary) hypertension: Secondary | ICD-10-CM

## 2011-11-23 LAB — CBC WITH DIFFERENTIAL/PLATELET
Eosinophils Relative: 2.9 % (ref 0.0–5.0)
HCT: 48.2 % (ref 39.0–52.0)
Hemoglobin: 16 g/dL (ref 13.0–17.0)
Lymphs Abs: 3.1 10*3/uL (ref 0.7–4.0)
Monocytes Relative: 7.5 % (ref 3.0–12.0)
Neutro Abs: 6.4 10*3/uL (ref 1.4–7.7)
Platelets: 265 10*3/uL (ref 150.0–400.0)
WBC: 10.6 10*3/uL — ABNORMAL HIGH (ref 4.5–10.5)

## 2011-11-23 LAB — BASIC METABOLIC PANEL
CO2: 25 mEq/L (ref 19–32)
Calcium: 9.7 mg/dL (ref 8.4–10.5)
Glucose, Bld: 97 mg/dL (ref 70–99)
Sodium: 141 mEq/L (ref 135–145)

## 2011-11-23 LAB — HEPATIC FUNCTION PANEL
ALT: 28 U/L (ref 0–53)
AST: 20 U/L (ref 0–37)
Total Bilirubin: 1 mg/dL (ref 0.3–1.2)

## 2011-11-23 MED ORDER — AMLODIPINE BESYLATE 5 MG PO TABS: 5.0000 mg | ORAL_TABLET | Freq: Every day | ORAL | Status: DC

## 2011-11-23 MED ORDER — LEVOTHYROXINE SODIUM 50 MCG PO TABS: 50.0000 ug | ORAL_TABLET | Freq: Every day | ORAL | Status: DC

## 2011-11-23 MED ORDER — RIVASTIGMINE 4.6 MG/24HR TD PT24: 1.0000 | MEDICATED_PATCH | Freq: Every day | TRANSDERMAL | Status: DC

## 2011-11-23 MED ORDER — NEBIVOLOL HCL 20 MG PO TABS: ORAL_TABLET | ORAL | Status: DC

## 2011-11-23 MED ORDER — LOSARTAN POTASSIUM 100 MG PO TABS: 100.0000 mg | ORAL_TABLET | Freq: Every day | ORAL | Status: DC

## 2011-11-23 NOTE — Progress Notes (Signed)
Subjective:    Patient ID: Kenneth Williams, male    DOB: [birth year], 76 y.o.   MRN: [id number]  Hypertension Pertinent negatives include no chest pain, neck pain or palpitations.   C/o diarrhea from memory med   F/u on edema B legs - better The patient is here to follow up on chronic memory loss, moderate BPH symptoms, HTN, controlled partially with medicines, diet and exercise.   Wt Readings from Last 3 Encounters:  11/23/11 210 lb (95.255 kg)  08/20/11 212 lb (96.163 kg)  07/09/11 209 lb 11.2 oz (95.119 kg)   BP Readings from Last 3 Encounters:  11/23/11 140/100  08/20/11 150/108  05/24/11 128/70     Review of Systems  Constitutional: Negative for appetite change, fatigue and unexpected weight change.  HENT: Positive for hearing loss. Negative for ear pain, nosebleeds, congestion, sore throat, sneezing, trouble swallowing, neck pain, neck stiffness and ear discharge.   Eyes: Negative for itching and visual disturbance.  Respiratory: Negative for cough.   Cardiovascular: Negative for chest pain, palpitations and leg swelling.  Gastrointestinal: Negative for nausea, diarrhea, blood in stool and abdominal distention.  Genitourinary: Positive for decreased urine volume. Negative for frequency and hematuria.  Musculoskeletal: Negative for back pain, joint swelling and gait problem.  Skin: Negative for rash.  Neurological: Negative for dizziness, tremors, speech difficulty and weakness.  Psychiatric/Behavioral: Negative for suicidal ideas, disturbed wake/sleep cycle, dysphoric mood and agitation. The patient is not nervous/anxious.        Objective:   Physical Exam  Constitutional: He is oriented to person, place, and time. He appears well-developed.  HENT:  Head: Normocephalic and atraumatic.  Mouth/Throat: Oropharynx is clear and moist. No oropharyngeal exudate.  Eyes: Conjunctivae normal are normal. Pupils are equal, round, and reactive to light.  Neck: Normal range of  motion. No JVD present. No tracheal deviation present. No thyromegaly present.  Cardiovascular: Normal rate, regular rhythm, normal heart sounds and intact distal pulses.  Exam reveals no gallop and no friction rub.   No murmur heard. Pulmonary/Chest: Effort normal and breath sounds normal. No respiratory distress. He has no wheezes. He has no rales. He exhibits no tenderness.  Abdominal: Soft. Bowel sounds are normal. He exhibits no distension and no mass. There is no tenderness. There is no rebound and no guarding.  Genitourinary: Rectum normal.  Musculoskeletal: Normal range of motion. He exhibits no edema and no tenderness.  Lymphadenopathy:    He has no cervical adenopathy.  Neurological: He is alert and oriented to person, place, and time. He has normal reflexes. No cranial nerve deficit. He exhibits normal muscle tone. Coordination normal.  Skin: Skin is warm and dry. No rash noted.  Psychiatric: He has a normal mood and affect. His behavior is normal. Judgment and thought content normal.  HR is ok    Lab Results  Component Value Date   WBC 10.1 05/18/2011   HGB 14.7 05/18/2011   HCT 44.7 05/18/2011   PLT 249.0 05/18/2011   GLUCOSE 80 05/18/2011   CHOL 170 05/18/2011   TRIG 108.0 05/18/2011   HDL 41.30 05/18/2011   LDLDIRECT 114.5 05/04/2010   LDLCALC 107* 05/18/2011   ALT 28 05/18/2011   AST 26 05/18/2011   NA 137 05/18/2011   K 4.6 05/18/2011   CL 104 05/18/2011   CREATININE 1.4 05/18/2011   BUN 14 05/18/2011   CO2 23 05/18/2011   TSH 2.21 05/18/2011   PSA 5.53* 05/18/2011   INR 1.0 ratio 02/25/2009  Assessment & Plan:

## 2011-11-23 NOTE — Assessment & Plan Note (Signed)
Better  

## 2011-11-23 NOTE — Assessment & Plan Note (Signed)
Chronic due to his Razadyne 2013. Will switch to a patch

## 2011-11-23 NOTE — Assessment & Plan Note (Signed)
Continue with current prescription therapy as reflected on the Med list.  

## 2011-11-23 NOTE — Assessment & Plan Note (Signed)
Move up on Bystolic dose

## 2011-11-23 NOTE — Assessment & Plan Note (Signed)
No relapse 

## 2011-11-23 NOTE — Assessment & Plan Note (Signed)
Will change to Exelon patch

## 2011-11-23 NOTE — Assessment & Plan Note (Signed)
CBC

## 2011-11-25 ENCOUNTER — Other Ambulatory Visit: Payer: Self-pay | Admitting: Internal Medicine

## 2012-02-29 ENCOUNTER — Ambulatory Visit (INDEPENDENT_AMBULATORY_CARE_PROVIDER_SITE_OTHER): Payer: Medicare Other | Admitting: Internal Medicine

## 2012-02-29 ENCOUNTER — Encounter: Payer: Self-pay | Admitting: Internal Medicine

## 2012-02-29 VITALS — BP 120/70 | HR 76 | Temp 98.0°F | Resp 16 | Wt 211.0 lb

## 2012-02-29 DIAGNOSIS — R413 Other amnesia: Secondary | ICD-10-CM

## 2012-02-29 DIAGNOSIS — E039 Hypothyroidism, unspecified: Secondary | ICD-10-CM

## 2012-02-29 DIAGNOSIS — I6529 Occlusion and stenosis of unspecified carotid artery: Secondary | ICD-10-CM

## 2012-02-29 DIAGNOSIS — R202 Paresthesia of skin: Secondary | ICD-10-CM

## 2012-02-29 DIAGNOSIS — R209 Unspecified disturbances of skin sensation: Secondary | ICD-10-CM

## 2012-02-29 DIAGNOSIS — R972 Elevated prostate specific antigen [PSA]: Secondary | ICD-10-CM

## 2012-02-29 DIAGNOSIS — I10 Essential (primary) hypertension: Secondary | ICD-10-CM

## 2012-02-29 DIAGNOSIS — E785 Hyperlipidemia, unspecified: Secondary | ICD-10-CM

## 2012-02-29 HISTORY — DX: Occlusion and stenosis of unspecified carotid artery: I65.29

## 2012-02-29 MED ORDER — RIVASTIGMINE TARTRATE 6 MG PO CAPS: 6.0000 mg | ORAL_CAPSULE | Freq: Two times a day (BID) | ORAL | Status: DC

## 2012-02-29 NOTE — Assessment & Plan Note (Signed)
Continue with current prescription therapy as reflected on the Med list.  

## 2012-02-29 NOTE — Progress Notes (Signed)
Subjective:    Patient ID: Kenneth Williams, male    DOB: [birth year], 77 y.o.   MRN: [id number]  Hypertension Pertinent negatives include no chest pain, neck pain or palpitations.   F/u on diarrhea from memory med - resolved, patches were too $$$   F/u on edema B legs - better The patient is here to follow up on chronic memory loss, moderate BPH symptoms, HTN, controlled partially with medicines, diet and exercise.   Wt Readings from Last 3 Encounters:  02/29/12 211 lb (95.709 kg)  11/23/11 210 lb (95.255 kg)  08/20/11 212 lb (96.163 kg)   BP Readings from Last 3 Encounters:  02/29/12 120/70  11/23/11 140/100  08/20/11 150/108     Review of Systems  Constitutional: Negative for appetite change, fatigue and unexpected weight change.  HENT: Positive for hearing loss. Negative for ear pain, nosebleeds, congestion, sore throat, sneezing, trouble swallowing, neck pain, neck stiffness and ear discharge.   Eyes: Negative for itching and visual disturbance.  Respiratory: Negative for cough.   Cardiovascular: Negative for chest pain, palpitations and leg swelling.  Gastrointestinal: Negative for nausea, diarrhea, blood in stool and abdominal distention.  Genitourinary: Positive for decreased urine volume. Negative for frequency and hematuria.  Musculoskeletal: Negative for back pain, joint swelling and gait problem.  Skin: Negative for rash.  Neurological: Negative for dizziness, tremors, speech difficulty and weakness.  Psychiatric/Behavioral: Negative for suicidal ideas, sleep disturbance, dysphoric mood and agitation. The patient is not nervous/anxious.        Objective:   Physical Exam  Constitutional: He is oriented to person, place, and time. He appears well-developed.  HENT:  Head: Normocephalic and atraumatic.  Mouth/Throat: Oropharynx is clear and moist. No oropharyngeal exudate.  Eyes: Conjunctivae normal are normal. Pupils are equal, round, and reactive to light.    Neck: Normal range of motion. No JVD present. No tracheal deviation present. No thyromegaly present.  Cardiovascular: Normal rate, regular rhythm, normal heart sounds and intact distal pulses.  Exam reveals no gallop and no friction rub.   No murmur heard. Pulmonary/Chest: Effort normal and breath sounds normal. No respiratory distress. He has no wheezes. He has no rales. He exhibits no tenderness.  Abdominal: Soft. Bowel sounds are normal. He exhibits no distension and no mass. There is no tenderness. There is no rebound and no guarding.  Genitourinary: Rectum normal.  Musculoskeletal: Normal range of motion. He exhibits no edema and no tenderness.  Lymphadenopathy:    He has no cervical adenopathy.  Neurological: He is alert and oriented to person, place, and time. He has normal reflexes. No cranial nerve deficit. He exhibits normal muscle tone. Coordination normal.  Skin: Skin is warm and dry. No rash noted.  Psychiatric: He has a normal mood and affect. His behavior is normal. Judgment and thought content normal.  HR is ok    Lab Results  Component Value Date   WBC 10.6* 11/23/2011   HGB 16.0 11/23/2011   HCT 48.2 11/23/2011   PLT 265.0 11/23/2011   GLUCOSE 97 11/23/2011   CHOL 170 05/18/2011   TRIG 108.0 05/18/2011   HDL 41.30 05/18/2011   LDLDIRECT 114.5 05/04/2010   LDLCALC 107* 05/18/2011   ALT 28 11/23/2011   AST 20 11/23/2011   NA 141 11/23/2011   K 5.1 11/23/2011   CL 106 11/23/2011   CREATININE 1.5 11/23/2011   BUN 18 11/23/2011   CO2 25 11/23/2011   TSH 3.34 11/23/2011   PSA 3.79 11/23/2011  INR 1.0 ratio 02/25/2009         Assessment & Plan:

## 2012-02-29 NOTE — Assessment & Plan Note (Signed)
PSA q 12 mo °

## 2012-02-29 NOTE — Assessment & Plan Note (Signed)
2013 B 0-39% q 24 mo Continue with current prescription therapy as reflected on the Med list.

## 2012-02-29 NOTE — Assessment & Plan Note (Signed)
Will try Exelon po (patches were tol ok, however they were too $$$)

## 2012-02-29 NOTE — Patient Instructions (Signed)
BP Readings from Last 3 Encounters:  02/29/12 120/70  11/23/11 140/100  08/20/11 150/108   Wt Readings from Last 3 Encounters:  02/29/12 211 lb (95.709 kg)  11/23/11 210 lb (95.255 kg)  08/20/11 212 lb (96.163 kg)

## 2012-04-04 ENCOUNTER — Other Ambulatory Visit: Payer: Self-pay | Admitting: Internal Medicine

## 2012-04-05 ENCOUNTER — Other Ambulatory Visit: Payer: Self-pay | Admitting: Internal Medicine

## 2012-04-05 MED ORDER — RIVASTIGMINE 4.6 MG/24HR TD PT24: 1.0000 | MEDICATED_PATCH | Freq: Every day | TRANSDERMAL | Status: DC

## 2012-05-18 ENCOUNTER — Other Ambulatory Visit: Payer: Self-pay | Admitting: Internal Medicine

## 2012-06-01 ENCOUNTER — Ambulatory Visit (INDEPENDENT_AMBULATORY_CARE_PROVIDER_SITE_OTHER): Payer: Medicare Other | Admitting: Internal Medicine

## 2012-06-01 ENCOUNTER — Encounter: Payer: Self-pay | Admitting: Internal Medicine

## 2012-06-01 VITALS — BP 150/82 | HR 72 | Temp 97.7°F | Resp 16 | Wt 211.0 lb

## 2012-06-01 DIAGNOSIS — L570 Actinic keratosis: Secondary | ICD-10-CM

## 2012-06-01 MED ORDER — LEVOTHYROXINE SODIUM 50 MCG PO TABS: 50.0000 ug | ORAL_TABLET | Freq: Every day | ORAL | Status: DC

## 2012-06-01 MED ORDER — FUROSEMIDE 20 MG PO TABS: 20.0000 mg | ORAL_TABLET | Freq: Every day | ORAL | Status: DC

## 2012-06-01 MED ORDER — SIMVASTATIN 40 MG PO TABS: 40.0000 mg | ORAL_TABLET | Freq: Every day | ORAL | Status: DC

## 2012-06-01 MED ORDER — PHOSPHATIDYLSERINE-DHA-EPA 100-19.5-6.5 MG PO CAPS: 1.0000 | ORAL_CAPSULE | Freq: Every day | ORAL | Status: DC

## 2012-06-01 NOTE — Assessment & Plan Note (Signed)
Cryo  

## 2012-06-01 NOTE — Progress Notes (Signed)
Subjective:    Hypertension This is a recurrent problem. The current episode started more than 1 year ago. Pertinent negatives include no chest pain, neck pain or palpitations.   F/u on diarrhea from memory med - resolved Using patches   F/u on edema B legs - better The patient is here to follow up on chronic memory loss, moderate BPH symptoms, HTN, controlled partially with medicines, diet and exercise.   Wt Readings from Last 3 Encounters:  06/01/12 211 lb (95.709 kg)  02/29/12 211 lb (95.709 kg)  11/23/11 210 lb (95.255 kg)   BP Readings from Last 3 Encounters:  06/01/12 150/82  02/29/12 120/70  11/23/11 140/100     Review of Systems  Constitutional: Negative for appetite change, fatigue and unexpected weight change.  HENT: Positive for hearing loss. Negative for ear pain, nosebleeds, congestion, sore throat, sneezing, trouble swallowing, neck pain, neck stiffness and ear discharge.   Eyes: Negative for itching and visual disturbance.  Respiratory: Negative for cough.   Cardiovascular: Negative for chest pain, palpitations and leg swelling.  Gastrointestinal: Negative for nausea, diarrhea, blood in stool and abdominal distention.  Genitourinary: Positive for decreased urine volume. Negative for frequency and hematuria.  Musculoskeletal: Negative for back pain, joint swelling and gait problem.  Skin: Negative for rash.  Neurological: Negative for dizziness, tremors, speech difficulty and weakness.  Psychiatric/Behavioral: Negative for suicidal ideas, sleep disturbance, dysphoric mood and agitation. The patient is not nervous/anxious.        Objective:   Physical Exam  Constitutional: He is oriented to person, place, and time. He appears well-developed.  HENT:  Head: Normocephalic and atraumatic.  Mouth/Throat: Oropharynx is clear and moist. No oropharyngeal exudate.  Eyes: Conjunctivae are normal. Pupils are equal, round, and reactive to light.  Neck: Normal  range of motion. No JVD present. No tracheal deviation present. No thyromegaly present.  Cardiovascular: Normal rate, regular rhythm, normal heart sounds and intact distal pulses.  Exam reveals no gallop and no friction rub.   No murmur heard. Pulmonary/Chest: Effort normal and breath sounds normal. No respiratory distress. He has no wheezes. He has no rales. He exhibits no tenderness.  Abdominal: Soft. Bowel sounds are normal. He exhibits no distension and no mass. There is no tenderness. There is no rebound and no guarding.  Genitourinary: Rectum normal.  Musculoskeletal: Normal range of motion. He exhibits no edema and no tenderness.  Lymphadenopathy:    He has no cervical adenopathy.  Neurological: He is alert and oriented to person, place, and time. He has normal reflexes. No cranial nerve deficit. He exhibits normal muscle tone. Coordination normal.  Skin: Skin is warm and dry. No rash noted.  Psychiatric: He has a normal mood and affect. His behavior is normal. Judgment and thought content normal.  HR is ok [id number] on face A/o/c   Lab Results  Component Value Date   WBC 10.6* 11/23/2011   HGB 16.0 11/23/2011   HCT 48.2 11/23/2011   PLT 265.0 11/23/2011   GLUCOSE 97 11/23/2011   CHOL 170 05/18/2011   TRIG 108.0 05/18/2011   HDL 41.30 05/18/2011   LDLDIRECT 114.5 05/04/2010   LDLCALC 107* 05/18/2011   ALT 28 11/23/2011   AST 20 11/23/2011   NA 141 11/23/2011   K 5.1 11/23/2011   CL 106 11/23/2011   CREATININE 1.5 11/23/2011   BUN 18 11/23/2011   CO2 25 11/23/2011   TSH 3.34 11/23/2011   PSA 3.79 11/23/2011   INR 1.0 ratio 02/25/2009  Procedure Note :     Procedure : Cryosurgery   Indication:   Actinic keratosis(es)   Risks including unsuccessful procedure , bleeding, infection, bruising, scar, a need for a repeat  procedure and others were explained to the patient in detail as well as the benefits. Informed consent was obtained verbally.   4  lesion(s)  on  face was/were treated with  liquid nitrogen on a Q-tip in a usual fasion . Band-Aid was applied and antibiotic ointment was given for a later use.   Tolerated well. Complications none.   Postprocedure instructions :     Keep the wounds clean. You can wash them with liquid soap and water. Pat dry with gauze or a Kleenex tissue  Before applying antibiotic ointment and a Band-Aid.   You need to report immediately  if  any signs of infection develop.         Assessment & Plan:

## 2012-08-07 ENCOUNTER — Encounter: Payer: Self-pay | Admitting: Gastroenterology

## 2012-08-23 ENCOUNTER — Other Ambulatory Visit: Payer: Self-pay | Admitting: *Deleted

## 2012-08-23 DIAGNOSIS — R972 Elevated prostate specific antigen [PSA]: Secondary | ICD-10-CM

## 2012-08-28 ENCOUNTER — Other Ambulatory Visit (INDEPENDENT_AMBULATORY_CARE_PROVIDER_SITE_OTHER): Payer: Medicare Other

## 2012-08-28 DIAGNOSIS — R209 Unspecified disturbances of skin sensation: Secondary | ICD-10-CM

## 2012-08-28 DIAGNOSIS — I6529 Occlusion and stenosis of unspecified carotid artery: Secondary | ICD-10-CM

## 2012-08-28 DIAGNOSIS — R202 Paresthesia of skin: Secondary | ICD-10-CM

## 2012-08-28 DIAGNOSIS — R972 Elevated prostate specific antigen [PSA]: Secondary | ICD-10-CM

## 2012-08-28 DIAGNOSIS — E039 Hypothyroidism, unspecified: Secondary | ICD-10-CM

## 2012-08-28 DIAGNOSIS — I10 Essential (primary) hypertension: Secondary | ICD-10-CM

## 2012-08-28 DIAGNOSIS — R413 Other amnesia: Secondary | ICD-10-CM

## 2012-08-28 DIAGNOSIS — E785 Hyperlipidemia, unspecified: Secondary | ICD-10-CM

## 2012-08-28 LAB — BASIC METABOLIC PANEL
BUN: 19 mg/dL (ref 6–23)
Calcium: 10 mg/dL (ref 8.4–10.5)
Creatinine, Ser: 1.5 mg/dL (ref 0.4–1.5)
GFR: 48.48 mL/min — ABNORMAL LOW (ref >60.00)
Glucose, Bld: 117 mg/dL — ABNORMAL HIGH (ref 70–99)

## 2012-08-28 LAB — CBC WITH DIFFERENTIAL/PLATELET
Basophils Absolute: 0 10*3/uL (ref 0.0–0.1)
HCT: 46.5 % (ref 39.0–52.0)
Lymphocytes Relative: 31.3 % (ref 12.0–46.0)
Lymphs Abs: 3.2 10*3/uL (ref 0.7–4.0)
Monocytes Relative: 6.8 % (ref 3.0–12.0)
Neutrophils Relative %: 57.6 % (ref 43.0–77.0)
Platelets: 250 10*3/uL (ref 150.0–400.0)
RDW: 13.3 % (ref 11.5–14.6)

## 2012-08-28 LAB — LIPID PANEL: HDL: 39.3 mg/dL (ref >39.00)

## 2012-08-28 LAB — TSH: TSH: 2.36 u[IU]/mL (ref 0.35–5.50)

## 2012-08-28 LAB — PSA: PSA: 2.37 ng/mL (ref 0.10–4.00)

## 2012-08-30 ENCOUNTER — Encounter: Payer: Self-pay | Admitting: Internal Medicine

## 2012-08-30 ENCOUNTER — Ambulatory Visit (INDEPENDENT_AMBULATORY_CARE_PROVIDER_SITE_OTHER): Payer: Medicare Other | Admitting: Internal Medicine

## 2012-08-30 VITALS — BP 140/90 | HR 68 | Temp 97.8°F | Resp 16 | Wt 214.0 lb

## 2012-08-30 DIAGNOSIS — R413 Other amnesia: Secondary | ICD-10-CM

## 2012-08-30 DIAGNOSIS — R972 Elevated prostate specific antigen [PSA]: Secondary | ICD-10-CM

## 2012-08-30 DIAGNOSIS — I10 Essential (primary) hypertension: Secondary | ICD-10-CM

## 2012-08-30 DIAGNOSIS — N259 Disorder resulting from impaired renal tubular function, unspecified: Secondary | ICD-10-CM

## 2012-08-30 DIAGNOSIS — R197 Diarrhea, unspecified: Secondary | ICD-10-CM

## 2012-08-30 MED ORDER — COLCHICINE 0.6 MG PO TABS: 0.6000 mg | ORAL_TABLET | Freq: Every day | ORAL | Status: DC | PRN

## 2012-08-30 MED ORDER — RIVASTIGMINE 4.6 MG/24HR TD PT24: 1.0000 | MEDICATED_PATCH | Freq: Every day | TRANSDERMAL | Status: DC

## 2012-08-30 MED ORDER — AMLODIPINE BESYLATE 5 MG PO TABS: 5.0000 mg | ORAL_TABLET | Freq: Every day | ORAL | Status: DC

## 2012-08-30 MED ORDER — SIMVASTATIN 40 MG PO TABS: 40.0000 mg | ORAL_TABLET | Freq: Every day | ORAL | Status: DC

## 2012-08-30 MED ORDER — FINASTERIDE 5 MG PO TABS: 5.0000 mg | ORAL_TABLET | Freq: Every day | ORAL | Status: DC

## 2012-08-30 MED ORDER — FUROSEMIDE 20 MG PO TABS: 20.0000 mg | ORAL_TABLET | Freq: Every day | ORAL | Status: DC

## 2012-08-30 MED ORDER — PHOSPHATIDYLSERINE-DHA-EPA 100-19.5-6.5 MG PO CAPS: 1.0000 | ORAL_CAPSULE | Freq: Every day | ORAL | Status: DC

## 2012-08-30 MED ORDER — LOSARTAN POTASSIUM 100 MG PO TABS: 100.0000 mg | ORAL_TABLET | Freq: Every day | ORAL | Status: DC

## 2012-08-30 MED ORDER — LEVOTHYROXINE SODIUM 50 MCG PO TABS: 50.0000 ug | ORAL_TABLET | Freq: Every day | ORAL | Status: DC

## 2012-08-30 MED ORDER — NEBIVOLOL HCL 20 MG PO TABS: ORAL_TABLET | ORAL | Status: DC

## 2012-08-30 NOTE — Progress Notes (Signed)
Subjective:    Hypertension Pertinent negatives include no chest pain, neck pain or palpitations.   F/u on diarrhea from memory med - resolved, patches were too $$$   F/u on edema B legs - better The patient is here to follow up on chronic memory loss - worse, moderate BPH symptoms - worse, HTN, controlled partially with medicines, diet and exercise.   Wt Readings from Last 3 Encounters:  08/30/12 214 lb (97.07 kg)  06/01/12 211 lb (95.709 kg)  02/29/12 211 lb (95.709 kg)   BP Readings from Last 3 Encounters:  08/30/12 140/90  06/01/12 150/82  02/29/12 120/70     Review of Systems  Constitutional: Negative for appetite change, fatigue and unexpected weight change.  HENT: Positive for hearing loss. Negative for ear pain, nosebleeds, congestion, sore throat, sneezing, trouble swallowing, neck pain, neck stiffness and ear discharge.   Eyes: Negative for itching and visual disturbance.  Respiratory: Negative for cough.   Cardiovascular: Negative for chest pain, palpitations and leg swelling.  Gastrointestinal: Negative for nausea, diarrhea, blood in stool and abdominal distention.  Genitourinary: Positive for decreased urine volume. Negative for frequency and hematuria.  Musculoskeletal: Negative for back pain, joint swelling and gait problem.  Skin: Negative for rash.  Neurological: Negative for dizziness, tremors, speech difficulty and weakness.  Psychiatric/Behavioral: Negative for suicidal ideas, sleep disturbance, dysphoric mood and agitation. The patient is not nervous/anxious.        Objective:   Physical Exam  Constitutional: He is oriented to person, place, and time. He appears well-developed.  HENT:  Head: Normocephalic and atraumatic.  Mouth/Throat: Oropharynx is clear and moist. No oropharyngeal exudate.  Eyes: Conjunctivae are normal. Pupils are equal, round, and reactive to light.  Neck: Normal range of motion. No JVD present. No tracheal deviation  present. No thyromegaly present.  Cardiovascular: Normal rate, regular rhythm, normal heart sounds and intact distal pulses.  Exam reveals no gallop and no friction rub.   No murmur heard. Pulmonary/Chest: Effort normal and breath sounds normal. No respiratory distress. He has no wheezes. He has no rales. He exhibits no tenderness.  Abdominal: Soft. Bowel sounds are normal. He exhibits no distension and no mass. There is no tenderness. There is no rebound and no guarding.  Genitourinary: Rectum normal.  Musculoskeletal: Normal range of motion. He exhibits no edema and no tenderness.  Lymphadenopathy:    He has no cervical adenopathy.  Neurological: He is alert and oriented to person, place, and time. He has normal reflexes. No cranial nerve deficit. He exhibits normal muscle tone. Coordination normal.  Skin: Skin is warm and dry. No rash noted.  Psychiatric: He has a normal mood and affect. His behavior is normal. Judgment and thought content normal.  HR is ok    Lab Results  Component Value Date   WBC 10.3 08/28/2012   HGB 15.6 08/28/2012   HCT 46.5 08/28/2012   PLT 250.0 08/28/2012   GLUCOSE 117* 08/28/2012   CHOL 188 08/28/2012   TRIG 219.0* 08/28/2012   HDL 39.30 08/28/2012   LDLDIRECT 111.2 08/28/2012   LDLCALC 107* 05/18/2011   ALT 28 11/23/2011   AST 20 11/23/2011   NA 142 08/28/2012   K 5.0 08/28/2012   CL 107 08/28/2012   CREATININE 1.5 08/28/2012   BUN 19 08/28/2012   CO2 27 08/28/2012   TSH 2.36 08/28/2012   PSA 2.37 08/28/2012   INR 1.0 ratio 02/25/2009         Assessment & Plan:

## 2012-09-03 NOTE — Assessment & Plan Note (Signed)
Continue with current prescription therapy as reflected on the Med list. BP Readings from Last 3 Encounters:  08/30/12 140/90  06/01/12 150/82  02/29/12 120/70

## 2012-09-03 NOTE — Assessment & Plan Note (Signed)
Watching  

## 2012-09-03 NOTE — Assessment & Plan Note (Signed)
Discussed options 

## 2012-09-03 NOTE — Assessment & Plan Note (Signed)
Continue with current prescription therapy as reflected on the Med list.  

## 2012-09-03 NOTE — Assessment & Plan Note (Signed)
Monitoring

## 2012-09-04 ENCOUNTER — Ambulatory Visit: Payer: Medicare Other | Admitting: Internal Medicine

## 2012-09-26 ENCOUNTER — Ambulatory Visit (AMBULATORY_SURGERY_CENTER): Payer: Medicare Other

## 2012-09-26 VITALS — Ht 74.0 in | Wt 214.6 lb

## 2012-09-26 DIAGNOSIS — Z860100 Personal history of colon polyps, unspecified: Secondary | ICD-10-CM

## 2012-09-26 MED ORDER — MOVIPREP 100 G PO SOLR: ORAL | Status: DC

## 2012-09-27 ENCOUNTER — Encounter: Payer: Self-pay | Admitting: Gastroenterology

## 2012-09-27 ENCOUNTER — Other Ambulatory Visit: Payer: Self-pay

## 2012-10-10 ENCOUNTER — Encounter: Payer: Self-pay | Admitting: Gastroenterology

## 2012-10-10 ENCOUNTER — Ambulatory Visit (AMBULATORY_SURGERY_CENTER): Payer: Medicare Other | Admitting: Gastroenterology

## 2012-10-10 VITALS — BP 125/70 | HR 59 | Temp 98.4°F | Resp 38 | Ht 74.0 in | Wt 214.0 lb

## 2012-10-10 DIAGNOSIS — Z86010 Personal history of colonic polyps: Secondary | ICD-10-CM

## 2012-10-10 MED ORDER — SODIUM CHLORIDE 0.9 % IV SOLN: 500.0000 mL | INTRAVENOUS | Status: DC

## 2012-10-10 NOTE — Progress Notes (Signed)
Patient did not experience any of the following events: a burn prior to discharge; a fall within the facility; wrong site/side/patient/procedure/implant event; or a hospital transfer or hospital admission upon discharge from the facility. (G8907) Patient did not have preoperative order for IV antibiotic SSI prophylaxis. (G8918)  

## 2012-10-10 NOTE — Progress Notes (Signed)
Pt. Late leaving due to ride not being available until 1055.

## 2012-10-10 NOTE — Patient Instructions (Addendum)
YOU HAD AN ENDOSCOPIC PROCEDURE TODAY AT THE [facility]: Refer to the procedure report that was given to you for any specific questions about what was found during the examination.  If the procedure report does not answer your questions, please call your gastroenterologist to clarify.  If you requested that your care partner not be given the details of your procedure findings, then the procedure report has been included in a sealed envelope for you to review at your convenience later.  YOU SHOULD EXPECT: Some feelings of bloating in the abdomen. Passage of more gas than usual.  Walking can help get rid of the air that was put into your GI tract during the procedure and reduce the bloating. If you had a lower endoscopy (such as a colonoscopy or flexible sigmoidoscopy) you may notice spotting of blood in your stool or on the toilet paper. If you underwent a bowel prep for your procedure, then you may not have a normal bowel movement for a few days.  DIET: Your first meal following the procedure should be a light meal and then it is ok to progress to your normal diet.  A half-sandwich or bowl of soup is an example of a good first meal.  Heavy or fried foods are harder to digest and may make you feel nauseous or bloated.  Likewise meals heavy in dairy and vegetables can cause extra gas to form and this can also increase the bloating.  Drink plenty of fluids but you should avoid alcoholic beverages for 24 hours.  ACTIVITY: Your care partner should take you home directly after the procedure.  You should plan to take it easy, moving slowly for the rest of the day.  You can resume normal activity the day after the procedure however you should NOT DRIVE or use heavy machinery for 24 hours (because of the sedation medicines used during the test).    SYMPTOMS TO REPORT IMMEDIATELY: A gastroenterologist can be reached at any hour.  During normal business hours, 8:30 AM to 5:00 PM Monday through Friday,  call [phone].  After hours and on weekends, please call the GI answering service at [phone] who will take a message and have the physician on call contact you.   Following lower endoscopy (colonoscopy or flexible sigmoidoscopy):  Excessive amounts of blood in the stool  Significant tenderness or worsening of abdominal pains  Swelling of the abdomen that is new, acute  Fever of 100F or higher    FOLLOW UP: If any biopsies were taken you will be contacted by phone or by letter within the next 1-3 weeks.  Call your gastroenterologist if you have not heard about the biopsies in 3 weeks.  Our staff will call the home number listed on your records the next business day following your procedure to check on you and address any questions or concerns that you may have at that time regarding the information given to you following your procedure. This is a courtesy call and so if there is no answer at the home number and we have not heard from you through the emergency physician on call, we will assume that you have returned to your regular daily activities without incident.  SIGNATURES/CONFIDENTIALITY: You and/or your care partner have signed paperwork which will be entered into your electronic medical record.  These signatures attest to the fact that that the information above on your After Visit Summary has been reviewed and is understood.  Full responsibility of the confidentiality   of this discharge information lies with you and/or your care-partner.   Diverticulosis and hemmorrhoid information given.    High fiber diet with liberal fluid intake reccomended-6-8 glasses of water daily.   Given your age you won't need another colonoscopy as these tests usually stop at 75 years of age.

## 2012-10-10 NOTE — Op Note (Signed)
[place]  [address] [place], [id number]   COLONOSCOPY PROCEDURE REPORT  PATIENT: Kenneth, Williams  MR#: [id number] BIRTHDATE: 1935/07/27 , 77  yrs. old GENDER: Male ENDOSCOPIST: Ladene Artist, MD, [facility] PROCEDURE DATE:  10/10/2012 PROCEDURE:   Colonoscopy, screening First Screening Colonoscopy - Avg.  risk and is 50 yrs.  old or older - No.  Prior Negative Screening - Now for repeat screening. Above average risk  History of Adenoma - Now for follow-up colonoscopy & has been > or = to 3 yrs.  Yes hx of adenoma.  Has been 3 or more years since last colonoscopy.  Polyps Removed Today? No.  Recommend repeat exam, <10 yrs? No. ASA CLASS:   Class II INDICATIONS:Patient's personal history of colon polyps. MEDICATIONS: MAC sedation, administered by CRNA and propofol (Diprivan) 250mg  IV DESCRIPTION OF PROCEDURE:   After the risks benefits and alternatives of the procedure were thoroughly explained, informed consent was obtained.  A digital rectal exam revealed no abnormalities of the rectum.   The LB [id number]  endoscope was introduced through the anus and advanced to the cecum, which was identified by both the appendix and ileocecal valve. No adverse events experienced.   The quality of the prep was adequate, using MoviPrep  The instrument was then slowly withdrawn as the colon was fully examined.  COLON FINDINGS: Moderate diverticulosis was noted in the transverse colon, descending colon, and sigmoid colon.   The colon was otherwise normal.  There was no diverticulosis, inflammation, polyps or cancers unless previously stated.  Retroflexed views revealed moderate internal hemorrhoids. The time to cecum=3 minutes 30 seconds.  Withdrawal time=9 minutes 04 seconds.  The scope was withdrawn and the procedure completed.  COMPLICATIONS: There were no complications.  ENDOSCOPIC IMPRESSION: 1.   Moderate diverticulosis was noted in the transverse  colon, descending colon, and sigmoid colon 2.   Moderate internal hemorrhoids  RECOMMENDATIONS: 1.  High fiber diet with liberal fluid intake. 2.  Given your age, you will not need another colonoscopy for colon cancer screening or polyp surveillance.  These types of tests usually stop around the age 75.  eSigned:  Ladene Artist, MD, [facility] 10/10/2012 9:58 AM

## 2012-10-11 ENCOUNTER — Telehealth: Payer: Self-pay | Admitting: *Deleted

## 2012-10-11 NOTE — Telephone Encounter (Signed)
  Follow up Call-  Call back number 10/10/2012  Post procedure Call Back phone  # [phone]  Permission to leave phone message Yes     Patient questions:  Do you have a fever, pain , or abdominal swelling? no Pain Score  0 *  Have you tolerated food without any problems? yes  Have you been able to return to your normal activities? yes  Do you have any questions about your discharge instructions: Diet   no Medications  no Follow up visit  no  Do you have questions or concerns about your Care? no  Actions: * If pain score is 4 or above: No action needed, pain <4.

## 2012-10-20 ENCOUNTER — Other Ambulatory Visit: Payer: Self-pay | Admitting: Internal Medicine

## 2012-11-26 ENCOUNTER — Other Ambulatory Visit: Payer: Self-pay | Admitting: Internal Medicine

## 2012-11-27 ENCOUNTER — Other Ambulatory Visit: Payer: Self-pay | Admitting: *Deleted

## 2012-11-27 ENCOUNTER — Other Ambulatory Visit: Payer: Self-pay | Admitting: Internal Medicine

## 2012-11-27 MED ORDER — LOSARTAN POTASSIUM 100 MG PO TABS: 100.0000 mg | ORAL_TABLET | Freq: Every day | ORAL | Status: DC

## 2012-12-05 ENCOUNTER — Ambulatory Visit (INDEPENDENT_AMBULATORY_CARE_PROVIDER_SITE_OTHER): Payer: Medicare Other | Admitting: Internal Medicine

## 2012-12-05 ENCOUNTER — Encounter: Payer: Self-pay | Admitting: Internal Medicine

## 2012-12-05 VITALS — BP 140/80 | HR 80 | Temp 98.3°F | Resp 16 | Wt 213.0 lb

## 2012-12-05 DIAGNOSIS — I10 Essential (primary) hypertension: Secondary | ICD-10-CM

## 2012-12-05 DIAGNOSIS — R972 Elevated prostate specific antigen [PSA]: Secondary | ICD-10-CM

## 2012-12-05 DIAGNOSIS — Z23 Encounter for immunization: Secondary | ICD-10-CM

## 2012-12-05 DIAGNOSIS — R453 Demoralization and apathy: Secondary | ICD-10-CM

## 2012-12-05 DIAGNOSIS — D649 Anemia, unspecified: Secondary | ICD-10-CM

## 2012-12-05 DIAGNOSIS — N259 Disorder resulting from impaired renal tubular function, unspecified: Secondary | ICD-10-CM

## 2012-12-05 MED ORDER — BUPROPION HCL ER (XL) 150 MG PO TB24: 150.0000 mg | ORAL_TABLET | Freq: Every day | ORAL | Status: DC

## 2012-12-05 MED ORDER — AMLODIPINE BESYLATE 5 MG PO TABS: 5.0000 mg | ORAL_TABLET | Freq: Every day | ORAL | Status: DC

## 2012-12-05 MED ORDER — SIMVASTATIN 40 MG PO TABS: 40.0000 mg | ORAL_TABLET | Freq: Every day | ORAL | Status: DC

## 2012-12-05 MED ORDER — FUROSEMIDE 20 MG PO TABS: 20.0000 mg | ORAL_TABLET | ORAL | Status: DC

## 2012-12-05 MED ORDER — LOSARTAN POTASSIUM 100 MG PO TABS: 100.0000 mg | ORAL_TABLET | Freq: Every day | ORAL | Status: DC

## 2012-12-05 MED ORDER — FINASTERIDE 5 MG PO TABS: 5.0000 mg | ORAL_TABLET | Freq: Every day | ORAL | Status: DC

## 2012-12-05 MED ORDER — NEBIVOLOL HCL 20 MG PO TABS: ORAL_TABLET | ORAL | Status: DC

## 2012-12-05 MED ORDER — PHOSPHATIDYLSERINE-DHA-EPA 100-19.5-6.5 MG PO CAPS: 1.0000 | ORAL_CAPSULE | Freq: Every day | ORAL | Status: DC

## 2012-12-05 MED ORDER — LEVOTHYROXINE SODIUM 50 MCG PO TABS: 50.0000 ug | ORAL_TABLET | Freq: Every day | ORAL | Status: DC

## 2012-12-05 NOTE — Assessment & Plan Note (Signed)
labs

## 2012-12-05 NOTE — Assessment & Plan Note (Signed)
Continue with current prescription therapy as reflected on the Med list.  

## 2012-12-05 NOTE — Assessment & Plan Note (Signed)
Labs

## 2012-12-05 NOTE — Assessment & Plan Note (Signed)
Start Wellbutrin 

## 2012-12-05 NOTE — Progress Notes (Signed)
Subjective:    Hypertension This is a chronic problem. Pertinent negatives include no chest pain, neck pain or palpitations.   F/u on diarrhea from memory med - he is able to use patches; forgets to put them on  F/u on edema B legs - better The patient is here to follow up on chronic memory loss - worse, moderate BPH symptoms - worse, HTN, controlled partially with medicines, diet and exercise.   Wt Readings from Last 3 Encounters:  12/05/12 213 lb (96.616 kg)  10/10/12 214 lb (97.07 kg)  09/26/12 214 lb 9.6 oz (97.342 kg)   BP Readings from Last 3 Encounters:  12/05/12 140/80  10/10/12 125/70  08/30/12 140/90     Review of Systems  Constitutional: Negative for appetite change, fatigue and unexpected weight change.  HENT: Positive for hearing loss. Negative for congestion, ear discharge, ear pain, nosebleeds, sneezing, sore throat and trouble swallowing.   Eyes: Negative for itching and visual disturbance.  Respiratory: Negative for cough.   Cardiovascular: Negative for chest pain, palpitations and leg swelling.  Gastrointestinal: Negative for nausea, diarrhea, blood in stool and abdominal distention.  Genitourinary: Positive for decreased urine volume. Negative for frequency and hematuria.  Musculoskeletal: Negative for back pain, gait problem, joint swelling, neck pain and neck stiffness.  Skin: Negative for rash.  Neurological: Negative for dizziness, tremors, speech difficulty and weakness.  Psychiatric/Behavioral: Negative for suicidal ideas, sleep disturbance, dysphoric mood and agitation. The patient is not nervous/anxious.        Objective:   Physical Exam  Constitutional: He is oriented to person, place, and time. He appears well-developed.  HENT:  Head: Normocephalic and atraumatic.  Mouth/Throat: Oropharynx is clear and moist. No oropharyngeal exudate.  Eyes: Conjunctivae are normal. Pupils are equal, round, and reactive to light.  Neck: Normal range of  motion. No JVD present. No tracheal deviation present. No thyromegaly present.  Cardiovascular: Normal rate, regular rhythm, normal heart sounds and intact distal pulses.  Exam reveals no gallop and no friction rub.   No murmur heard. Pulmonary/Chest: Effort normal and breath sounds normal. No respiratory distress. He has no wheezes. He has no rales. He exhibits no tenderness.  Abdominal: Soft. Bowel sounds are normal. He exhibits no distension and no mass. There is no tenderness. There is no rebound and no guarding.  Genitourinary: Rectum normal.  Musculoskeletal: Normal range of motion. He exhibits no edema and no tenderness.  Lymphadenopathy:    He has no cervical adenopathy.  Neurological: He is alert and oriented to person, place, and time. He has normal reflexes. No cranial nerve deficit. He exhibits normal muscle tone. Coordination normal.  Skin: Skin is warm and dry. No rash noted.  Psychiatric: He has a normal mood and affect. His behavior is normal. Judgment and thought content normal.  HR is ok    Lab Results  Component Value Date   WBC 10.3 08/28/2012   HGB 15.6 08/28/2012   HCT 46.5 08/28/2012   PLT 250.0 08/28/2012   GLUCOSE 117* 08/28/2012   CHOL 188 08/28/2012   TRIG 219.0* 08/28/2012   HDL 39.30 08/28/2012   LDLDIRECT 111.2 08/28/2012   LDLCALC 107* 05/18/2011   ALT 28 11/23/2011   AST 20 11/23/2011   NA 142 08/28/2012   K 5.0 08/28/2012   CL 107 08/28/2012   CREATININE 1.5 08/28/2012   BUN 19 08/28/2012   CO2 27 08/28/2012   TSH 2.36 08/28/2012   PSA 2.37 08/28/2012   INR 1.0 ratio  02/25/2009         Assessment & Plan:

## 2012-12-05 NOTE — Assessment & Plan Note (Signed)
Used to be watched by Dr Pilgrim's Pride

## 2012-12-05 NOTE — Assessment & Plan Note (Signed)
Continue with current prescription therapy as reflected on the Med list. BP Readings from Last 3 Encounters:  12/05/12 140/80  10/10/12 125/70  08/30/12 140/90

## 2013-03-09 ENCOUNTER — Encounter: Payer: Self-pay | Admitting: Internal Medicine

## 2013-03-09 ENCOUNTER — Ambulatory Visit (INDEPENDENT_AMBULATORY_CARE_PROVIDER_SITE_OTHER): Payer: Medicare Other | Admitting: Internal Medicine

## 2013-03-09 VITALS — BP 140/82 | HR 64 | Temp 97.9°F | Resp 16 | Wt 212.0 lb

## 2013-03-09 DIAGNOSIS — Z8719 Personal history of other diseases of the digestive system: Secondary | ICD-10-CM

## 2013-03-09 DIAGNOSIS — E785 Hyperlipidemia, unspecified: Secondary | ICD-10-CM

## 2013-03-09 DIAGNOSIS — R972 Elevated prostate specific antigen [PSA]: Secondary | ICD-10-CM

## 2013-03-09 DIAGNOSIS — N259 Disorder resulting from impaired renal tubular function, unspecified: Secondary | ICD-10-CM

## 2013-03-09 DIAGNOSIS — E039 Hypothyroidism, unspecified: Secondary | ICD-10-CM

## 2013-03-09 DIAGNOSIS — R197 Diarrhea, unspecified: Secondary | ICD-10-CM

## 2013-03-09 NOTE — Progress Notes (Signed)
Subjective:    Hypertension Pertinent negatives include no chest pain, neck pain or palpitations.   F/u on diarrhea from memory med - resolved, patches were too $$$   F/u on edema B legs - better The patient is here to follow up on chronic memory loss - worse, moderate BPH symptoms - worse, HTN, controlled partially with medicines, diet and exercise.   Wt Readings from Last 3 Encounters:  03/09/13 212 lb (96.163 kg)  12/05/12 213 lb (96.616 kg)  10/10/12 214 lb (97.07 kg)   BP Readings from Last 3 Encounters:  03/09/13 140/82  12/05/12 140/80  10/10/12 125/70     Review of Systems  Constitutional: Negative for appetite change, fatigue and unexpected weight change.  HENT: Positive for hearing loss. Negative for congestion, ear discharge, ear pain, nosebleeds, sneezing, sore throat and trouble swallowing.   Eyes: Negative for itching and visual disturbance.  Respiratory: Negative for cough.   Cardiovascular: Negative for chest pain, palpitations and leg swelling.  Gastrointestinal: Negative for nausea, diarrhea, blood in stool and abdominal distention.  Genitourinary: Positive for decreased urine volume. Negative for frequency and hematuria.  Musculoskeletal: Negative for back pain, gait problem, joint swelling, neck pain and neck stiffness.  Skin: Negative for rash.  Neurological: Negative for dizziness, tremors, speech difficulty and weakness.  Psychiatric/Behavioral: Negative for suicidal ideas, sleep disturbance, dysphoric mood and agitation. The patient is not nervous/anxious.        Objective:   Physical Exam  Constitutional: He is oriented to person, place, and time. He appears well-developed.  HENT:  Head: Normocephalic and atraumatic.  Mouth/Throat: Oropharynx is clear and moist. No oropharyngeal exudate.  Eyes: Conjunctivae are normal. Pupils are equal, round, and reactive to light.  Neck: Normal range of motion. No JVD present. No tracheal deviation present.  No thyromegaly present.  Cardiovascular: Normal rate, regular rhythm, normal heart sounds and intact distal pulses.  Exam reveals no gallop and no friction rub.   No murmur heard. Pulmonary/Chest: Effort normal and breath sounds normal. No respiratory distress. He has no wheezes. He has no rales. He exhibits no tenderness.  Abdominal: Soft. Bowel sounds are normal. He exhibits no distension and no mass. There is no tenderness. There is no rebound and no guarding.  Genitourinary: Rectum normal.  Musculoskeletal: Normal range of motion. He exhibits no edema and no tenderness.  Lymphadenopathy:    He has no cervical adenopathy.  Neurological: He is alert and oriented to person, place, and time. He has normal reflexes. No cranial nerve deficit. He exhibits normal muscle tone. Coordination normal.  Skin: Skin is warm and dry. No rash noted.  Psychiatric: He has a normal mood and affect. His behavior is normal. Judgment and thought content normal.  HR is ok Moles on face B ingrown toenails, thick big toes    Lab Results  Component Value Date   WBC 10.3 08/28/2012   HGB 15.6 08/28/2012   HCT 46.5 08/28/2012   PLT 250.0 08/28/2012   GLUCOSE 117* 08/28/2012   CHOL 188 08/28/2012   TRIG 219.0* 08/28/2012   HDL 39.30 08/28/2012   LDLDIRECT 111.2 08/28/2012   LDLCALC 107* 05/18/2011   ALT 28 11/23/2011   AST 20 11/23/2011   NA 142 08/28/2012   K 5.0 08/28/2012   CL 107 08/28/2012   CREATININE 1.5 08/28/2012   BUN 19 08/28/2012   CO2 27 08/28/2012   TSH 2.36 08/28/2012   PSA 2.37 08/28/2012   INR 1.0 ratio 02/25/2009  Assessment & Plan:

## 2013-03-09 NOTE — Patient Instructions (Addendum)
[employer]  Milk free trial (no milk, ice cream, cheese and yogurt) for 4-6 weeks. OK to use almond, coconut, rice or soy milk. "Almond breeze" brand tastes good.  Stop Simvastatin x 2 weeks

## 2013-03-09 NOTE — Assessment & Plan Note (Signed)
Continue with current prescription therapy as reflected on the Med list.  

## 2013-03-09 NOTE — Progress Notes (Signed)
Pre visit review using our clinic review tool, if applicable. No additional management support is needed unless otherwise documented below in the visit note. 

## 2013-03-09 NOTE — Assessment & Plan Note (Signed)
He could have some insufficiency Try Creon

## 2013-03-09 NOTE — Assessment & Plan Note (Signed)
Try Creon   Milk free trial (no milk, ice cream, cheese and yogurt) for 4-6 weeks. OK to use almond, coconut, rice or soy milk. "Almond breeze" brand tastes good.

## 2013-03-16 ENCOUNTER — Other Ambulatory Visit (INDEPENDENT_AMBULATORY_CARE_PROVIDER_SITE_OTHER): Payer: Medicare Other

## 2013-03-16 DIAGNOSIS — N259 Disorder resulting from impaired renal tubular function, unspecified: Secondary | ICD-10-CM

## 2013-03-16 DIAGNOSIS — Z8719 Personal history of other diseases of the digestive system: Secondary | ICD-10-CM

## 2013-03-16 DIAGNOSIS — E039 Hypothyroidism, unspecified: Secondary | ICD-10-CM

## 2013-03-16 DIAGNOSIS — R972 Elevated prostate specific antigen [PSA]: Secondary | ICD-10-CM

## 2013-03-16 DIAGNOSIS — N400 Enlarged prostate without lower urinary tract symptoms: Secondary | ICD-10-CM

## 2013-03-16 DIAGNOSIS — E785 Hyperlipidemia, unspecified: Secondary | ICD-10-CM

## 2013-03-16 DIAGNOSIS — R197 Diarrhea, unspecified: Secondary | ICD-10-CM

## 2013-03-16 LAB — BASIC METABOLIC PANEL
BUN: 16 mg/dL (ref 6–23)
CALCIUM: 9.9 mg/dL (ref 8.4–10.5)
CO2: 28 mEq/L (ref 19–32)
CREATININE: 1.7 mg/dL — AB (ref 0.4–1.5)
Chloride: 107 mEq/L (ref 96–112)
GFR: 43.37 mL/min — ABNORMAL LOW (ref >60.00)
GLUCOSE: 96 mg/dL (ref 70–99)
Potassium: 4.6 mEq/L (ref 3.5–5.1)
SODIUM: 140 meq/L (ref 135–145)

## 2013-03-16 LAB — HEPATIC FUNCTION PANEL
ALBUMIN: 3.6 g/dL (ref 3.5–5.2)
ALK PHOS: 59 U/L (ref 39–117)
ALT: 31 U/L (ref 0–53)
AST: 19 U/L (ref 0–37)
Bilirubin, Direct: 0.1 mg/dL (ref 0.0–0.3)
Total Bilirubin: 0.7 mg/dL (ref 0.3–1.2)
Total Protein: 6.4 g/dL (ref 6.0–8.3)

## 2013-03-16 LAB — PSA: PSA: 1.17 ng/mL (ref 0.10–4.00)

## 2013-03-16 LAB — TSH: TSH: 3.31 u[IU]/mL (ref 0.35–5.50)

## 2013-03-20 ENCOUNTER — Encounter: Payer: Self-pay | Admitting: Internal Medicine

## 2013-03-20 ENCOUNTER — Ambulatory Visit (INDEPENDENT_AMBULATORY_CARE_PROVIDER_SITE_OTHER): Payer: Medicare Other | Admitting: Internal Medicine

## 2013-03-20 VITALS — BP 140/88 | HR 72 | Temp 97.0°F | Resp 16 | Wt 213.0 lb

## 2013-03-20 DIAGNOSIS — D485 Neoplasm of uncertain behavior of skin: Secondary | ICD-10-CM

## 2013-03-20 DIAGNOSIS — D489 Neoplasm of uncertain behavior, unspecified: Secondary | ICD-10-CM

## 2013-03-20 NOTE — Progress Notes (Signed)
Pre visit review using our clinic review tool, if applicable. No additional management support is needed unless otherwise documented below in the visit note. 

## 2013-03-20 NOTE — Patient Instructions (Signed)
Postprocedure instructions :    A Band-Aid should be  changed twice daily. You can take a shower tomorrow.  Keep the wounds clean. You can wash them with liquid soap and water. Pat dry with gauze or a Kleenex tissue  Before applying antibiotic ointment and a Band-Aid.   You need to report immediately  if fever, chills or any signs of infection develop.    The biopsy results should be available in 1 -2 weeks. 

## 2013-03-20 NOTE — Progress Notes (Signed)
Procedure Note :     Procedure :  Skin biopsy   Indication:  Changing mole (s ),  Suspicious lesion(s)   Risks including unsuccessful procedure , bleeding, infection, bruising, scar, a need for another complete procedure and others were explained to the patient in detail as well as the benefits. Informed consent was obtained and signed.   The patient was placed in a decubitus position.  Lesion #1 under R eye   measuring 11x6    mm   Skin over lesion #1  was prepped with Betadine and alcohol  and anesthetized with 0.5 cc of 2% lidocaine and epinephrine, using a 25-gauge 1 inch needle.  Shave biopsy with a sterile Dermablade was carried out in the usual fashion. Hyfrecator was used to destroy the rest of the lesion potentially left behind and for hemostasis. Band-Aid was applied with antibiotic ointment.    Lesion #2 under L eye    measuring 9x5  mm   Skin over lesion #2  was prepped with Betadine and alcohol  and anesthetized with 0.5 cc of 2% lidocaine and epinephrine, using a 25-gauge 1 inch needle.  Shave biopsy with a sterile Dermablade was carried out in the usual fashion. Hyfrecator was used to destroy the rest of the lesion potentially left behind and for hemostasis. Band-Aid was applied with antibiotic ointment.  Lesion #3 on R prox neck    measuring 3x4  mm   Skin over lesion #3  was prepped with Betadine and alcohol  and anesthetized with 1 cc of 2% lidocaine and epinephrine, using a 25-gauge 1 inch needle.  Shave biopsy with a sterile Dermablade was carried out in the usual fashion. Hyfrecator was used to destroy the rest of the lesion potentially left behind and for hemostasis. Band-Aid was applied with antibiotic ointment.   Tolerated well. Complications none. #3 was discarded

## 2013-03-21 NOTE — Assessment & Plan Note (Signed)
See procedure 

## 2013-04-19 ENCOUNTER — Encounter: Payer: Medicare Other | Admitting: *Deleted

## 2013-05-07 ENCOUNTER — Telehealth: Payer: Self-pay | Admitting: *Deleted

## 2013-05-07 NOTE — Telephone Encounter (Signed)
Kenneth Williams called to report during her visit today pt had an elevated BP of 170/82.

## 2013-05-07 NOTE — Telephone Encounter (Signed)
Check BP at home NAS diet Keep ROV Thx

## 2013-05-08 NOTE — Telephone Encounter (Signed)
Left detailed message on VM of MDs note.

## 2013-05-27 ENCOUNTER — Other Ambulatory Visit: Payer: Self-pay | Admitting: Internal Medicine

## 2013-06-07 ENCOUNTER — Ambulatory Visit (INDEPENDENT_AMBULATORY_CARE_PROVIDER_SITE_OTHER): Payer: Medicare Other | Admitting: Internal Medicine

## 2013-06-07 ENCOUNTER — Encounter: Payer: Self-pay | Admitting: Internal Medicine

## 2013-06-07 ENCOUNTER — Other Ambulatory Visit (INDEPENDENT_AMBULATORY_CARE_PROVIDER_SITE_OTHER): Payer: Medicare Other

## 2013-06-07 VITALS — BP 152/90 | HR 80 | Temp 97.2°F | Resp 16 | Wt 211.0 lb

## 2013-06-07 DIAGNOSIS — R972 Elevated prostate specific antigen [PSA]: Secondary | ICD-10-CM

## 2013-06-07 DIAGNOSIS — Z23 Encounter for immunization: Secondary | ICD-10-CM

## 2013-06-07 DIAGNOSIS — R413 Other amnesia: Secondary | ICD-10-CM

## 2013-06-07 DIAGNOSIS — N259 Disorder resulting from impaired renal tubular function, unspecified: Secondary | ICD-10-CM

## 2013-06-07 LAB — BASIC METABOLIC PANEL
BUN: 18 mg/dL (ref 6–23)
CO2: 29 mEq/L (ref 19–32)
Calcium: 10.1 mg/dL (ref 8.4–10.5)
Chloride: 104 mEq/L (ref 96–112)
Creatinine, Ser: 1.4 mg/dL (ref 0.4–1.5)
GFR: 51.54 mL/min — AB (ref >60.00)
Glucose, Bld: 85 mg/dL (ref 70–99)
POTASSIUM: 5 meq/L (ref 3.5–5.1)
SODIUM: 139 meq/L (ref 135–145)

## 2013-06-07 LAB — URINALYSIS, ROUTINE W REFLEX MICROSCOPIC
Bilirubin Urine: NEGATIVE
HGB URINE DIPSTICK: NEGATIVE
Ketones, ur: NEGATIVE
LEUKOCYTES UA: NEGATIVE
NITRITE: NEGATIVE
Specific Gravity, Urine: 1.025 (ref 1.000–1.030)
Total Protein, Urine: 100 — AB
Urine Glucose: NEGATIVE
Urobilinogen, UA: 0.2 (ref 0.0–1.0)
pH: 6 (ref 5.0–8.0)

## 2013-06-07 LAB — LIPID PANEL
Cholesterol: 298 mg/dL — ABNORMAL HIGH (ref 0–200)
HDL: 35.5 mg/dL — AB (ref >39.00)
LDL Cholesterol: 202 mg/dL — ABNORMAL HIGH (ref 0–99)
Total CHOL/HDL Ratio: 8
Triglycerides: 303 mg/dL — ABNORMAL HIGH (ref 0.0–149.0)
VLDL: 60.6 mg/dL — AB (ref 0.0–40.0)

## 2013-06-07 LAB — HEPATIC FUNCTION PANEL
ALBUMIN: 3.8 g/dL (ref 3.5–5.2)
ALT: 23 U/L (ref 0–53)
AST: 18 U/L (ref 0–37)
Alkaline Phosphatase: 64 U/L (ref 39–117)
BILIRUBIN TOTAL: 1 mg/dL (ref 0.3–1.2)
Bilirubin, Direct: 0.1 mg/dL (ref 0.0–0.3)
TOTAL PROTEIN: 7.3 g/dL (ref 6.0–8.3)

## 2013-06-07 MED ORDER — LABETALOL HCL 200 MG PO TABS: 200.0000 mg | ORAL_TABLET | Freq: Two times a day (BID) | ORAL | Status: DC

## 2013-06-07 MED ORDER — LEVOTHYROXINE SODIUM 50 MCG PO TABS: ORAL_TABLET | ORAL | Status: DC

## 2013-06-07 NOTE — Assessment & Plan Note (Signed)
Continue with current prescription therapy as reflected on the Med list.  

## 2013-06-07 NOTE — Assessment & Plan Note (Signed)
Recurrent Used to be watched by Dr Pilgrim's Pride

## 2013-06-07 NOTE — Assessment & Plan Note (Signed)
UA

## 2013-06-07 NOTE — Progress Notes (Signed)
Subjective:    Hypertension Pertinent negatives include no chest pain, neck pain or palpitations. There is no history of a thyroid problem.   F/u on diarrhea from memory med - resolved, patches were too $$$   F/u on edema B legs - better The patient is here to follow up on chronic memory loss - worse, moderate BPH symptoms - worse, HTN, controlled partially with medicines, diet and exercise.   Wt Readings from Last 3 Encounters:  06/07/13 211 lb (95.709 kg)  03/20/13 213 lb (96.616 kg)  03/09/13 212 lb (96.163 kg)   BP Readings from Last 3 Encounters:  06/07/13 152/90  03/20/13 140/88  03/09/13 140/82     Review of Systems  Constitutional: Negative for appetite change, fatigue and unexpected weight change.  HENT: Positive for hearing loss. Negative for congestion, ear discharge, ear pain, nosebleeds, sneezing, sore throat and trouble swallowing.   Eyes: Negative for itching and visual disturbance.  Respiratory: Negative for cough.   Cardiovascular: Negative for chest pain, palpitations and leg swelling.  Gastrointestinal: Negative for nausea, diarrhea, blood in stool and abdominal distention.  Genitourinary: Positive for decreased urine volume. Negative for frequency and hematuria.  Musculoskeletal: Negative for back pain, gait problem, joint swelling, neck pain and neck stiffness.  Skin: Negative for rash.  Neurological: Negative for dizziness, tremors, speech difficulty and weakness.  Psychiatric/Behavioral: Negative for suicidal ideas, sleep disturbance, dysphoric mood and agitation. The patient is not nervous/anxious.        Objective:   Physical Exam  Constitutional: He is oriented to person, place, and time. He appears well-developed.  HENT:  Head: Normocephalic and atraumatic.  Mouth/Throat: Oropharynx is clear and moist. No oropharyngeal exudate.  Eyes: Conjunctivae are normal. Pupils are equal, round, and reactive to light.  Neck: Normal range of motion. No  JVD present. No tracheal deviation present. No thyromegaly present.  Cardiovascular: Normal rate, regular rhythm, normal heart sounds and intact distal pulses.  Exam reveals no gallop and no friction rub.   No murmur heard. Pulmonary/Chest: Effort normal and breath sounds normal. No respiratory distress. He has no wheezes. He has no rales. He exhibits no tenderness.  Abdominal: Soft. Bowel sounds are normal. He exhibits no distension and no mass. There is no tenderness. There is no rebound and no guarding.  Genitourinary: Rectum normal.  Musculoskeletal: Normal range of motion. He exhibits no edema and no tenderness.  Lymphadenopathy:    He has no cervical adenopathy.  Neurological: He is alert and oriented to person, place, and time. He has normal reflexes. No cranial nerve deficit. He exhibits normal muscle tone. Coordination normal.  Skin: Skin is warm and dry. No rash noted.  Psychiatric: He has a normal mood and affect. His behavior is normal. Judgment and thought content normal.  HR is ok Moles on face B ingrown toenails, thick big toes    Lab Results  Component Value Date   WBC 10.3 08/28/2012   HGB 15.6 08/28/2012   HCT 46.5 08/28/2012   PLT 250.0 08/28/2012   GLUCOSE 85 06/07/2013   CHOL 298* 06/07/2013   TRIG 303.0* 06/07/2013   HDL 35.50* 06/07/2013   LDLDIRECT 111.2 08/28/2012   LDLCALC 202* 06/07/2013   ALT 23 06/07/2013   AST 18 06/07/2013   NA 139 06/07/2013   K 5.0 06/07/2013   CL 104 06/07/2013   CREATININE 1.4 06/07/2013   BUN 18 06/07/2013   CO2 29 06/07/2013   TSH 3.31 03/16/2013   PSA 1.17 03/16/2013  INR 1.0 ratio 02/25/2009         Assessment & Plan:

## 2013-06-07 NOTE — Progress Notes (Signed)
Pre visit review using our clinic review tool, if applicable. No additional management support is needed unless otherwise documented below in the visit note. 

## 2013-06-08 LAB — GC/CHLAMYDIA PROBE AMP, URINE
CHLAMYDIA, SWAB/URINE, PCR: NEGATIVE
GC PROBE AMP, URINE: NEGATIVE

## 2013-09-06 ENCOUNTER — Encounter: Payer: Self-pay | Admitting: Internal Medicine

## 2013-09-06 ENCOUNTER — Ambulatory Visit (INDEPENDENT_AMBULATORY_CARE_PROVIDER_SITE_OTHER): Payer: Medicare Other | Admitting: Internal Medicine

## 2013-09-06 VITALS — BP 160/82 | HR 84 | Temp 98.2°F | Resp 16 | Wt 210.0 lb

## 2013-09-06 DIAGNOSIS — R453 Demoralization and apathy: Secondary | ICD-10-CM

## 2013-09-06 DIAGNOSIS — E039 Hypothyroidism, unspecified: Secondary | ICD-10-CM

## 2013-09-06 DIAGNOSIS — E785 Hyperlipidemia, unspecified: Secondary | ICD-10-CM

## 2013-09-06 DIAGNOSIS — I10 Essential (primary) hypertension: Secondary | ICD-10-CM

## 2013-09-06 DIAGNOSIS — N400 Enlarged prostate without lower urinary tract symptoms: Secondary | ICD-10-CM

## 2013-09-06 DIAGNOSIS — R413 Other amnesia: Secondary | ICD-10-CM

## 2013-09-06 MED ORDER — AMLODIPINE BESYLATE 5 MG PO TABS: 2.5000 mg | ORAL_TABLET | Freq: Every day | ORAL | Status: DC

## 2013-09-06 MED ORDER — LABETALOL HCL 200 MG PO TABS: 200.0000 mg | ORAL_TABLET | Freq: Three times a day (TID) | ORAL | Status: DC

## 2013-09-06 MED ORDER — TERAZOSIN HCL 2 MG PO CAPS: 2.0000 mg | ORAL_CAPSULE | Freq: Every day | ORAL | Status: DC

## 2013-09-06 NOTE — Assessment & Plan Note (Signed)
Worse Added Hytrin qhs

## 2013-09-06 NOTE — Progress Notes (Signed)
Pre visit review using our clinic review tool, if applicable. No additional management support is needed unless otherwise documented below in the visit note. 

## 2013-09-06 NOTE — Progress Notes (Signed)
Subjective:    Hypertension This is a chronic problem. The current episode started more than 1 year ago. Pertinent negatives include no chest pain, neck pain or palpitations. There is no history of a thyroid problem.   F/u on diarrhea from memory med - resolved, patches were too $$$   F/u on edema B legs - better, not well The patient is here to follow up on chronic memory loss - worse, moderate BPH symptoms - worse, HTN, controlled partially with medicines, diet and exercise.   Wt Readings from Last 3 Encounters:  09/06/13 210 lb (95.255 kg)  06/07/13 211 lb (95.709 kg)  03/20/13 213 lb (96.616 kg)   BP Readings from Last 3 Encounters:  09/06/13 160/82  06/07/13 152/90  03/20/13 140/88     Review of Systems  Constitutional: Negative for appetite change, fatigue and unexpected weight change.  HENT: Positive for hearing loss. Negative for congestion, ear discharge, ear pain, nosebleeds, sneezing, sore throat and trouble swallowing.   Eyes: Negative for itching and visual disturbance.  Respiratory: Negative for cough.   Cardiovascular: Negative for chest pain, palpitations and leg swelling.  Gastrointestinal: Negative for nausea, diarrhea, blood in stool and abdominal distention.  Genitourinary: Positive for decreased urine volume. Negative for frequency and hematuria.  Musculoskeletal: Negative for back pain, gait problem, joint swelling, neck pain and neck stiffness.  Skin: Negative for rash.  Neurological: Negative for dizziness, tremors, speech difficulty and weakness.  Psychiatric/Behavioral: Negative for suicidal ideas, sleep disturbance, dysphoric mood and agitation. The patient is not nervous/anxious.        Objective:   Physical Exam  Constitutional: He is oriented to person, place, and time. He appears well-developed.  HENT:  Head: Normocephalic and atraumatic.  Mouth/Throat: Oropharynx is clear and moist. No oropharyngeal exudate.  Eyes: Conjunctivae are  normal. Pupils are equal, round, and reactive to light.  Neck: Normal range of motion. No JVD present. No tracheal deviation present. No thyromegaly present.  Cardiovascular: Normal rate, regular rhythm, normal heart sounds and intact distal pulses.  Exam reveals no gallop and no friction rub.   No murmur heard. Pulmonary/Chest: Effort normal and breath sounds normal. No respiratory distress. He has no wheezes. He has no rales. He exhibits no tenderness.  Abdominal: Soft. Bowel sounds are normal. He exhibits no distension and no mass. There is no tenderness. There is no rebound and no guarding.  Genitourinary: Rectum normal.  Musculoskeletal: Normal range of motion. He exhibits edema (ankles 1+). He exhibits no tenderness.  Lymphadenopathy:    He has no cervical adenopathy.  Neurological: He is alert and oriented to person, place, and time. He has normal reflexes. No cranial nerve deficit. He exhibits normal muscle tone. Coordination normal.  Skin: Skin is warm and dry. No rash noted.  Psychiatric: He has a normal mood and affect. His behavior is normal. Judgment and thought content normal.  HR is ok Moles on face B ingrown toenails, thick big toes Sad   Lab Results  Component Value Date   WBC 10.3 08/28/2012   HGB 15.6 08/28/2012   HCT 46.5 08/28/2012   PLT 250.0 08/28/2012   GLUCOSE 85 06/07/2013   CHOL 298* 06/07/2013   TRIG 303.0* 06/07/2013   HDL 35.50* 06/07/2013   LDLDIRECT 111.2 08/28/2012   LDLCALC 202* 06/07/2013   ALT 23 06/07/2013   AST 18 06/07/2013   NA 139 06/07/2013   K 5.0 06/07/2013   CL 104 06/07/2013   CREATININE 1.4 06/07/2013  BUN 18 06/07/2013   CO2 29 06/07/2013   TSH 3.31 03/16/2013   PSA 1.17 03/16/2013   INR 1.0 ratio 02/25/2009         Assessment & Plan:

## 2013-09-06 NOTE — Assessment & Plan Note (Signed)
He re-started Simvastatin Labs

## 2013-09-06 NOTE — Assessment & Plan Note (Signed)
Stable  Under stress w/[facility] in Hospice

## 2013-09-06 NOTE — Assessment & Plan Note (Signed)
Continue with current prescription therapy as reflected on the Med list.  

## 2013-09-06 NOTE — Assessment & Plan Note (Addendum)
Added Hytrin Labetalol: increase to TID

## 2013-09-06 NOTE — Assessment & Plan Note (Signed)
Discussed.

## 2013-09-12 ENCOUNTER — Other Ambulatory Visit (INDEPENDENT_AMBULATORY_CARE_PROVIDER_SITE_OTHER): Payer: Medicare Other

## 2013-09-12 DIAGNOSIS — E785 Hyperlipidemia, unspecified: Secondary | ICD-10-CM

## 2013-09-12 DIAGNOSIS — E039 Hypothyroidism, unspecified: Secondary | ICD-10-CM

## 2013-09-12 DIAGNOSIS — I10 Essential (primary) hypertension: Secondary | ICD-10-CM

## 2013-09-12 LAB — HEPATIC FUNCTION PANEL
ALT: 26 U/L (ref 0–53)
AST: 18 U/L (ref 0–37)
Albumin: 3.9 g/dL (ref 3.5–5.2)
Alkaline Phosphatase: 62 U/L (ref 39–117)
BILIRUBIN TOTAL: 0.9 mg/dL (ref 0.2–1.2)
Bilirubin, Direct: 0.1 mg/dL (ref 0.0–0.3)
Total Protein: 7.3 g/dL (ref 6.0–8.3)

## 2013-09-12 LAB — BASIC METABOLIC PANEL
BUN: 22 mg/dL (ref 6–23)
CO2: 26 mEq/L (ref 19–32)
Calcium: 9.7 mg/dL (ref 8.4–10.5)
Chloride: 107 mEq/L (ref 96–112)
Creatinine, Ser: 1.6 mg/dL — ABNORMAL HIGH (ref 0.4–1.5)
GFR: 46.21 mL/min — ABNORMAL LOW (ref >60.00)
GLUCOSE: 88 mg/dL (ref 70–99)
POTASSIUM: 4.4 meq/L (ref 3.5–5.1)
SODIUM: 139 meq/L (ref 135–145)

## 2013-09-12 LAB — LIPID PANEL
CHOL/HDL RATIO: 6
CHOLESTEROL: 218 mg/dL — AB (ref 0–200)
HDL: 36.5 mg/dL — ABNORMAL LOW (ref >39.00)
LDL CALC: 144 mg/dL — AB (ref 0–99)
NonHDL: 181.5
Triglycerides: 190 mg/dL — ABNORMAL HIGH (ref 0.0–149.0)
VLDL: 38 mg/dL (ref 0.0–40.0)

## 2013-10-16 ENCOUNTER — Ambulatory Visit: Payer: Medicare Other | Admitting: Nurse Practitioner

## 2013-10-23 ENCOUNTER — Telehealth: Payer: Self-pay | Admitting: Internal Medicine

## 2013-10-23 ENCOUNTER — Ambulatory Visit: Payer: Medicare Other | Admitting: Nurse Practitioner

## 2013-10-23 DIAGNOSIS — Z0289 Encounter for other administrative examinations: Secondary | ICD-10-CM

## 2013-10-23 NOTE — Telephone Encounter (Signed)
Patient no showed for AWV with Steva Colder on 10/23/2013.  Please advise.

## 2013-10-24 NOTE — Telephone Encounter (Signed)
Noted../lmb 

## 2013-11-30 ENCOUNTER — Encounter: Payer: Self-pay | Admitting: Cardiology

## 2013-12-07 ENCOUNTER — Telehealth: Payer: Self-pay | Admitting: Cardiology

## 2013-12-07 NOTE — Telephone Encounter (Signed)
Spoke with scheduler and she informed me that pt does not need a new referral. I called pt offered him 11-5 at 9:45 he agreed to this appt. I informed pt that he would get a EKG this day and that if MD needs any further testing than that would be done at a later date. He verbalized understanding.

## 2013-12-07 NOTE — Telephone Encounter (Signed)
Spoke with pt b/c he received a delinquent device check letter. Pt missed this appt in 03/2013 and the letter was sent to follow up on that appt. Pt does not have a device. I informed pt of this and informed him that the appt was scheduled wrong. Pt stated that he used to see Dr. Caryl Comes and that Dr. Caryl Comes referred him to a have a heart cath and that he done 2 carotid dopplers with Dr. Caryl Comes. He wanted to know if he needed to follow up with Dr. Caryl Comes now. I informed him that since he has not been in our office since 2011 he would be considered a new pt and may need to see his PCP to get a referral. I informed him that I would find the answer out for him and I would call him back. Pt verbalized understanding.

## 2013-12-18 ENCOUNTER — Telehealth: Payer: Self-pay

## 2013-12-18 NOTE — Telephone Encounter (Signed)
Fax recvd requesting last ov notes for oral surgery clearance.   LOV notes sent.

## 2013-12-21 ENCOUNTER — Ambulatory Visit: Payer: Medicare Other | Admitting: Internal Medicine

## 2013-12-25 ENCOUNTER — Other Ambulatory Visit: Payer: Self-pay | Admitting: Internal Medicine

## 2013-12-27 ENCOUNTER — Ambulatory Visit (INDEPENDENT_AMBULATORY_CARE_PROVIDER_SITE_OTHER): Payer: Medicare Other | Admitting: Internal Medicine

## 2013-12-27 ENCOUNTER — Encounter: Payer: Self-pay | Admitting: Internal Medicine

## 2013-12-27 VITALS — BP 166/89 | HR 60 | Ht 73.0 in | Wt 206.6 lb

## 2013-12-27 DIAGNOSIS — I493 Ventricular premature depolarization: Secondary | ICD-10-CM

## 2013-12-27 MED ORDER — LABETALOL HCL 200 MG PO TABS: 400.0000 mg | ORAL_TABLET | Freq: Two times a day (BID) | ORAL | Status: DC

## 2013-12-27 MED ORDER — SPIRONOLACTONE 25 MG PO TABS: 12.5000 mg | ORAL_TABLET | Freq: Every day | ORAL | Status: DC

## 2013-12-27 MED ORDER — SPIRONOLACTONE 25 MG PO TABS: 25.0000 mg | ORAL_TABLET | Freq: Every day | ORAL | Status: DC

## 2013-12-27 NOTE — Patient Instructions (Addendum)
Your physician has recommended you make the following change in your medication:  1) STOP Amlodipine 2) DECREASE Labetalol to 400 mg twice a day 3) START Aldactone 12.5 mg daily  Follow up with your PCP, Dr. Alain Marion.  Please have them draw a BMET lab in one week and one month (because of starting Aldactone) to look at potassium level  No follow up is needed at this time with Dr. Caryl Comes. He will see you on an as needed basis.

## 2013-12-27 NOTE — Progress Notes (Signed)
    Patient Care Team: Aleksei Plotnikov V, MD as PCP - General Steven C Klein, MD (Cardiology) Malcolm T Stark, MD (Gastroenterology)   HPI  Kenneth Williams is a 78 y.o. male seen remotely for PVC and WCT during stress testing: cath 2011 with min obstruction.  He was treated with a betablocker which he failed to tolerate and amlodipine/ACE was used in stead    He also has Hypertension and it is for this which she comes in today. His blood pressures have been elevated for some time and he is frustrated that they have not been able to get lowered.  He notes also that his wife died about 4 months ago stating 3 months on hospice having presented to hospital with widely metastatic cancer. He had met her in [place] with the service. He was from [place]. They were married for 52 years. She was involved with [employer].. He is not because he can't hear  He has some peripheral edema. He denies chest discomfort. He has no shortness of breath.   Past Medical History  Diagnosis Date  . History of diverticulitis of colon   . Hyperlipidemia   . Hypertension   . Personal history of hyperthyroidism 2006    s/p 131I- Dr. Balan, Hyperthyroid  . History of pancreatitis   . History of TIAs   . BPH (benign prostatic hyperplasia)   . Hypothyroidism   . Elevated PSA     history of   . TGA (transient global amnesia)   . Hypothyroidism   . Left ear hearing loss     wears right hearing aid  . Gout     2009  . CAD (coronary artery disease)     cath 2011-mild non obstructive  . Renal insufficiency 2011    Past Surgical History  Procedure Laterality Date  . Cholecystectomy    . Colonoscopy    . Stapedes surgery      LEFT EAR    Current Outpatient Prescriptions  Medication Sig Dispense Refill  . amLODipine (NORVASC) 5 MG tablet Take 0.5 tablets (2.5 mg total) by mouth daily. 90 tablet 3  . aspirin 81 MG tablet Take 81 mg by mouth daily.      . cholecalciferol (VITAMIN D) 1000 UNITS  tablet Take 2,000 Units by mouth daily.      . colchicine 0.6 MG tablet Take 1 tablet (0.6 mg total) by mouth daily as needed. 90 tablet 3  . Cyanocobalamin (VITAMIN B 12 PO) Take 1 tablet by mouth daily.      . finasteride (PROSCAR) 5 MG tablet Take 1 tablet (5 mg total) by mouth daily. 90 tablet 3  . furosemide (LASIX) 20 MG tablet Take 1 tablet (20 mg total) by mouth every other day. 1 po qod 45 tablet 3  . labetalol (NORMODYNE) 200 MG tablet Take 1 tablet (200 mg total) by mouth 3 (three) times daily. 90 tablet 11  . levothyroxine (SYNTHROID) 50 MCG tablet TAKE 1 TABLET EVERY DAY  generic 90 tablet 3  . losartan (COZAAR) 100 MG tablet Take 1 tablet (100 mg total) by mouth daily. 90 tablet 3  . Phosphatidylserine-DHA-EPA (VAYACOG) 100-19.5-6.5 MG CAPS Take 1 capsule by mouth daily. 30 capsule 11  . simvastatin (ZOCOR) 40 MG tablet TAKE 1 TABLET AT BEDTIME 90 tablet 3   No current facility-administered medications for this visit.    Allergies  Allergen Reactions  . Amlodipine Other (See Comments)    edema  . Coreg [  Carvedilol] Diarrhea    Diarrhea and bradycardia  . Donepezil Hydrochloride Diarrhea    REACTION: diarrhea  . Hydrochlorothiazide Other (See Comments)    REACTION: Gout  . Razadyne [Galantamine Hydrobromide] Diarrhea    diarrhea    Review of Systems negative except from HPI and PMH  Physical Exam BP 166/89 mmHg  Pulse 60  Ht 6' 1" (1.854 m)  Wt 206 lb 9.6 oz (93.713 kg)  BMI 27.26 kg/m2 Well developed and well nourished in no acute distress HENT normal E scleral and icterus clear Neck Supple JVP flat; carotids brisk and full Clear to ausculation regular rate and rhythm, no murmurs +S4 Soft with active bowel sounds No clubbing cyanosis Trace Edema Alert and oriented, grossly normal motor and sensory function Skin Warm and Dry  Electrocardiogram demonstrates sinus rhythm at 60 Intervals 18/10/41 Poor R-wave progression  Assessment and   Plan  Hypertension  Nonobstructive coronary disease  palpitations  His blood pressure remains significantly elevated. He is on a very low-dose of amlodipine and it is his impression that it was associated with the development of edema. We will stop it. We will increase his labetalol 203 times a day--402 times a day. We will also begin him on Aldactone 12.5 mg. I have advised him that the 2 issues of concern or hyperkalemia and gynecomastia. He will need a metabolic profile checked in one week and one month and regularly thereafter.  I've told him that I am not the best blood pressure Dr. Around. I will have him follow-up with Dr. Alain Marion   We will not schedule follow-up at this time.

## 2014-01-01 ENCOUNTER — Ambulatory Visit (INDEPENDENT_AMBULATORY_CARE_PROVIDER_SITE_OTHER): Payer: Medicare Other | Admitting: Internal Medicine

## 2014-01-01 ENCOUNTER — Telehealth: Payer: Self-pay

## 2014-01-01 ENCOUNTER — Encounter: Payer: Self-pay | Admitting: Internal Medicine

## 2014-01-01 ENCOUNTER — Ambulatory Visit (INDEPENDENT_AMBULATORY_CARE_PROVIDER_SITE_OTHER): Payer: Medicare Other

## 2014-01-01 VITALS — BP 130/80 | HR 56 | Temp 98.4°F | Wt 206.0 lb

## 2014-01-01 DIAGNOSIS — E034 Atrophy of thyroid (acquired): Secondary | ICD-10-CM

## 2014-01-01 DIAGNOSIS — R972 Elevated prostate specific antigen [PSA]: Secondary | ICD-10-CM

## 2014-01-01 DIAGNOSIS — R453 Demoralization and apathy: Secondary | ICD-10-CM

## 2014-01-01 DIAGNOSIS — R413 Other amnesia: Secondary | ICD-10-CM | POA: Diagnosis not present

## 2014-01-01 DIAGNOSIS — G459 Transient cerebral ischemic attack, unspecified: Secondary | ICD-10-CM

## 2014-01-01 DIAGNOSIS — Z23 Encounter for immunization: Secondary | ICD-10-CM

## 2014-01-01 DIAGNOSIS — E038 Other specified hypothyroidism: Secondary | ICD-10-CM | POA: Diagnosis not present

## 2014-01-01 DIAGNOSIS — Z0000 Encounter for general adult medical examination without abnormal findings: Secondary | ICD-10-CM

## 2014-01-01 DIAGNOSIS — I10 Essential (primary) hypertension: Secondary | ICD-10-CM

## 2014-01-01 MED ORDER — PHOSPHATIDYLSERINE-DHA-EPA 100-19.5-6.5 MG PO CAPS: 1.0000 | ORAL_CAPSULE | Freq: Every day | ORAL | Status: DC

## 2014-01-01 NOTE — Telephone Encounter (Signed)
Encounter opened up by mistake.

## 2014-01-01 NOTE — Assessment & Plan Note (Signed)
The patient is here for annual Medicare wellness examination and management of other chronic and acute problems.   The risk factors are reflected in the social history.  The roster of all physicians providing medical care to patient - is listed in the Snapshot section of the chart.  Activities of daily living:  The patient is 100% inedpendent in all ADLs: dressing, toileting, feeding as well as independent mobility  Home safety : The patient has smoke detectors in the home. They wear seatbelts.No firearms at home ( firearms are present in the home, kept in a safe fashion). There is no violence in the home.   There is no risks for hepatitis, STDs or HIV. There is no   history of blood transfusion. They have no travel history to infectious disease endemic areas of the world.  The patient has (has not) seen their dentist in the last six month. They have (not) seen their eye doctor in the last year. They deny (admit to) any hearing difficulty and have not had audiologic testing in the last year.  They do not  have excessive sun exposure. Discussed the need for sun protection: hats, long sleeves and use of sunscreen if there is significant sun exposure.   Diet: the importance of a healthy diet is discussed. They do have a healthy (unhealthy-high fat/fast food) diet.  The patient has a regular exercise program:no  The benefits of regular aerobic exercise were discussed.  Depression screen: there are no signs or vegative symptoms of depression- irritability, change in appetite, anhedonia, sadness/tearfullness.  Cognitive assessment: the patient manages all their financial and personal affairs and is actively engaged. They could relate day,date,year and events; recalled 3/3 objects at 3 minutes; performed clock-face test normally.  The following portions of the patient's history were reviewed and updated as appropriate: allergies, current medications, past family history, past medical history,  past  surgical history, past social history  and problem list.  Vision, hearing, body mass index were assessed and reviewed.   During the course of the visit the patient was educated and counseled about appropriate screening and preventive services including : fall prevention , diabetes screening, nutrition counseling, colorectal cancer screening, and recommended immunizations.

## 2014-01-01 NOTE — Assessment & Plan Note (Signed)
Start [employer] Rx

## 2014-01-01 NOTE — Progress Notes (Signed)
Pre visit review using our clinic review tool, if applicable. No additional management support is needed unless otherwise documented below in the visit note. 

## 2014-01-01 NOTE — Assessment & Plan Note (Signed)
Better  

## 2014-01-01 NOTE — Assessment & Plan Note (Signed)
Monitoring

## 2014-01-01 NOTE — Assessment & Plan Note (Signed)
Continue with current prescription therapy as reflected on the Med list.  

## 2014-01-01 NOTE — Assessment & Plan Note (Signed)
On ASA No relapse 

## 2014-01-01 NOTE — Patient Instructions (Signed)

## 2014-01-01 NOTE — Progress Notes (Signed)
Subjective:   The patient is here for a wellness exam.  Hypertension Pertinent negatives include no chest pain, neck pain or palpitations.   F/u on diarrhea from memory med - resolved, patches were too $$$   F/u on edema B legs - better, not well The patient is here to follow up on chronic memory loss - worse, moderate BPH symptoms - worse, HTN, controlled partially with medicines, diet and exercise.   Wt Readings from Last 3 Encounters:  01/01/14 206 lb (93.441 kg)  12/27/13 206 lb 9.6 oz (93.713 kg)  09/06/13 210 lb (95.255 kg)   BP Readings from Last 3 Encounters:  01/01/14 130/80  12/27/13 166/89  09/06/13 160/82     Review of Systems  Constitutional: Negative for appetite change, fatigue and unexpected weight change.  HENT: Positive for hearing loss. Negative for congestion, ear discharge, ear pain, nosebleeds, sneezing, sore throat and trouble swallowing.   Eyes: Negative for itching and visual disturbance.  Respiratory: Negative for cough.   Cardiovascular: Negative for chest pain, palpitations and leg swelling.  Gastrointestinal: Negative for nausea, diarrhea, blood in stool and abdominal distention.  Genitourinary: Positive for decreased urine volume. Negative for frequency and hematuria.  Musculoskeletal: Negative for back pain, joint swelling, gait problem, neck pain and neck stiffness.  Skin: Negative for rash.  Neurological: Negative for dizziness, tremors, speech difficulty and weakness.  Psychiatric/Behavioral: Negative for suicidal ideas, sleep disturbance, dysphoric mood and agitation. The patient is not nervous/anxious.        Objective:   Physical Exam  Constitutional: He is oriented to person, place, and time. He appears well-developed. No distress.  NAD  HENT:  Mouth/Throat: Oropharynx is clear and moist.  Eyes: Conjunctivae are normal. Pupils are equal, round, and reactive to light.  Neck: Normal range of motion. No JVD present. No thyromegaly  present.  Cardiovascular: Normal rate, regular rhythm, normal heart sounds and intact distal pulses.  Exam reveals no gallop and no friction rub.   No murmur heard. Pulmonary/Chest: Effort normal and breath sounds normal. No respiratory distress. He has no wheezes. He has no rales. He exhibits no tenderness.  Abdominal: Soft. Bowel sounds are normal. He exhibits no distension and no mass. There is no tenderness. There is no rebound and no guarding.  Musculoskeletal: Normal range of motion. He exhibits no edema or tenderness.  Lymphadenopathy:    He has no cervical adenopathy.  Neurological: He is alert and oriented to person, place, and time. He has normal reflexes. No cranial nerve deficit. He exhibits normal muscle tone. He displays a negative Romberg sign. Coordination and gait normal.  No meningeal signs  Skin: Skin is warm and dry. No rash noted.  Psychiatric: He has a normal mood and affect. His behavior is normal. Judgment and thought content normal.  HR is ok Moles on face B ingrown toenails, thick big toes   Lab Results  Component Value Date   WBC 10.3 08/28/2012   HGB 15.6 08/28/2012   HCT 46.5 08/28/2012   PLT 250.0 08/28/2012   GLUCOSE 88 09/12/2013   CHOL 218* 09/12/2013   TRIG 190.0* 09/12/2013   HDL 36.50* 09/12/2013   LDLDIRECT 111.2 08/28/2012   LDLCALC 144* 09/12/2013   ALT 26 09/12/2013   AST 18 09/12/2013   NA 139 09/12/2013   K 4.4 09/12/2013   CL 107 09/12/2013   CREATININE 1.6* 09/12/2013   BUN 22 09/12/2013   CO2 26 09/12/2013   TSH 3.31 03/16/2013   PSA  1.17 03/16/2013   INR 1.0 ratio 02/25/2009         Assessment & Plan:

## 2014-01-02 ENCOUNTER — Telehealth: Payer: Self-pay | Admitting: Internal Medicine

## 2014-01-02 NOTE — Telephone Encounter (Signed)
emmi emailed °

## 2014-02-07 ENCOUNTER — Other Ambulatory Visit: Payer: Self-pay | Admitting: Internal Medicine

## 2014-02-16 ENCOUNTER — Other Ambulatory Visit: Payer: Self-pay | Admitting: Internal Medicine

## 2014-02-25 ENCOUNTER — Other Ambulatory Visit: Payer: Self-pay

## 2014-02-25 MED ORDER — LOSARTAN POTASSIUM 100 MG PO TABS: 100.0000 mg | ORAL_TABLET | Freq: Every day | ORAL | Status: DC

## 2014-03-08 ENCOUNTER — Ambulatory Visit (INDEPENDENT_AMBULATORY_CARE_PROVIDER_SITE_OTHER): Payer: Medicare Other | Admitting: Internal Medicine

## 2014-03-08 ENCOUNTER — Encounter: Payer: Self-pay | Admitting: Internal Medicine

## 2014-03-08 VITALS — BP 140/80 | HR 64 | Temp 98.5°F

## 2014-03-08 DIAGNOSIS — D489 Neoplasm of uncertain behavior, unspecified: Secondary | ICD-10-CM | POA: Diagnosis not present

## 2014-03-08 DIAGNOSIS — D485 Neoplasm of uncertain behavior of skin: Secondary | ICD-10-CM | POA: Diagnosis not present

## 2014-03-08 DIAGNOSIS — L820 Inflamed seborrheic keratosis: Secondary | ICD-10-CM | POA: Diagnosis not present

## 2014-03-08 DIAGNOSIS — D225 Melanocytic nevi of trunk: Secondary | ICD-10-CM | POA: Diagnosis not present

## 2014-03-08 DIAGNOSIS — B079 Viral wart, unspecified: Secondary | ICD-10-CM

## 2014-03-08 NOTE — Progress Notes (Signed)
Pre visit review using our clinic review tool, if applicable. No additional management support is needed unless otherwise documented below in the visit note. 

## 2014-03-08 NOTE — Progress Notes (Signed)
Patient ID: Kenneth Williams, male   DOB: 09-18-1935, 79 y.o.   MRN: [id number]   Procedure Note :     Procedure :  Skin biopsy   Indication:  Changing mole (s ),  Suspicious lesion(s)   Risks including unsuccessful procedure , bleeding, infection, bruising, scar, a need for another complete procedure and others were explained to the patient in detail as well as the benefits. Informed consent was obtained and signed.   The patient was placed in a decubitus position.  Lesion #1 on  L shoulder   measuring 6x6 mm   Skin over lesion #1  was prepped with Betadine and alcohol  and anesthetized with 1 cc of 2% lidocaine and epinephrine, using a 25-gauge 1 inch needle.  Shave biopsy with a sterile Dermablade was carried out in the usual fashion. Hyfrecator was used to destroy the rest of the lesion potentially left behind and for hemostasis. Band-Aid was applied with antibiotic ointment.    Lesion #2 on  L posterior chest below the shoulder blade   measuring 3x2  mm   Skin over lesion #2  was prepped with Betadine and alcohol  and anesthetized with 1 cc of 2% lidocaine and epinephrine, using a 25-gauge 1 inch needle.  Shave biopsy with a sterile Dermablade was carried out in the usual fashion. Hyfrecator was used to destroy the rest of the lesion potentially left behind and for hemostasis. Band-Aid was applied with antibiotic ointment.    Tolerated well. Complications none.   Procedure Note :     Procedure : Cryosurgery   Indication:  Wart(s)     Risks including unsuccessful procedure , bleeding, infection, bruising, scar, a need for a repeat  procedure and others were explained to the patient in detail as well as the benefits. Informed consent was obtained verbally.    2 lesion(s)  on  Head/scalp  was/were treated with liquid nitrogen on a Q-tip in a usual fasion . Band-Aid was applied and antibiotic ointment was given for a later use.   Tolerated well. Complications none.   Postprocedure  instructions :     Keep the wounds clean. You can wash them with liquid soap and water. Pat dry with gauze or a Kleenex tissue  Before applying antibiotic ointment and a Band-Aid.   You need to report immediately  if  any signs of infection develop.

## 2014-03-08 NOTE — Patient Instructions (Signed)
Postprocedure instructions :    A Band-Aid should be  changed twice daily. You can take a shower tomorrow.  Keep the wounds clean. You can wash them with liquid soap and water. Pat dry with gauze or a Kleenex tissue  Before applying antibiotic ointment and a Band-Aid.   You need to report immediately  if fever, chills or any signs of infection develop.    The biopsy results should be available in 1 -2 weeks. 

## 2014-03-10 DIAGNOSIS — B079 Viral wart, unspecified: Secondary | ICD-10-CM | POA: Insufficient documentation

## 2014-03-10 NOTE — Assessment & Plan Note (Signed)
Moles - see procedure

## 2014-03-10 NOTE — Assessment & Plan Note (Signed)
See Cryo 

## 2014-03-18 ENCOUNTER — Encounter: Payer: Self-pay | Admitting: Internal Medicine

## 2014-03-18 ENCOUNTER — Ambulatory Visit (INDEPENDENT_AMBULATORY_CARE_PROVIDER_SITE_OTHER): Payer: Medicare Other | Admitting: Internal Medicine

## 2014-03-18 ENCOUNTER — Telehealth: Payer: Self-pay | Admitting: Internal Medicine

## 2014-03-18 VITALS — BP 146/84 | HR 66 | Temp 97.5°F | Wt 202.0 lb

## 2014-03-18 DIAGNOSIS — I6523 Occlusion and stenosis of bilateral carotid arteries: Secondary | ICD-10-CM | POA: Diagnosis not present

## 2014-03-18 DIAGNOSIS — G459 Transient cerebral ischemic attack, unspecified: Secondary | ICD-10-CM | POA: Diagnosis not present

## 2014-03-18 DIAGNOSIS — R413 Other amnesia: Secondary | ICD-10-CM

## 2014-03-18 DIAGNOSIS — I10 Essential (primary) hypertension: Secondary | ICD-10-CM

## 2014-03-18 MED ORDER — ESCITALOPRAM OXALATE 10 MG PO TABS: 10.0000 mg | ORAL_TABLET | Freq: Every day | ORAL | Status: DC

## 2014-03-18 MED ORDER — ASPIRIN 325 MG PO TABS: 325.0000 mg | ORAL_TABLET | Freq: Every day | ORAL | Status: DC

## 2014-03-18 NOTE — Progress Notes (Signed)
Pre visit review using our clinic review tool, if applicable. No additional management support is needed unless otherwise documented below in the visit note.   Subjective:    C/o blurred vision, couldn't make sense out of reading on Sat night. His dtr came and his speech was slurred, couldn't spit words out. He had a HA.  The sx's resolved w/in 24 hrs. He was OK on Sunday  F/u on diarrhea from memory med - resolved, patches were too $$$   F/u on edema B legs - better, not well The patient is here to follow up on chronic memory loss - worse, moderate BPH symptoms - worse, HTN, controlled partially with medicines, diet and exercise.   Wt Readings from Last 3 Encounters:  03/18/14 202 lb (91.627 kg)  01/01/14 206 lb (93.441 kg)  12/27/13 206 lb 9.6 oz (93.713 kg)   BP Readings from Last 3 Encounters:  03/18/14 146/84  03/08/14 140/80  01/01/14 130/80     Review of Systems  Constitutional: Negative for appetite change, fatigue and unexpected weight change.  HENT: Positive for hearing loss. Negative for congestion, ear discharge, ear pain, nosebleeds, sneezing, sore throat and trouble swallowing.   Eyes: Negative for itching and visual disturbance.  Respiratory: Negative for cough.   Cardiovascular: Negative for chest pain, palpitations and leg swelling.  Gastrointestinal: Negative for nausea, diarrhea, blood in stool and abdominal distention.  Genitourinary: Positive for decreased urine volume. Negative for frequency and hematuria.  Musculoskeletal: Negative for back pain, joint swelling, gait problem, neck pain and neck stiffness.  Skin: Negative for rash.  Neurological: Negative for dizziness, tremors, speech difficulty and weakness.  Psychiatric/Behavioral: Negative for suicidal ideas, sleep disturbance, dysphoric mood and agitation. The patient is not nervous/anxious.        Objective:   Physical Exam  Constitutional: He is oriented to person, place, and time. He appears  well-developed. No distress.  NAD  HENT:  Mouth/Throat: Oropharynx is clear and moist.  Eyes: Conjunctivae are normal. Pupils are equal, round, and reactive to light.  Neck: Normal range of motion. No JVD present. No thyromegaly present.  Cardiovascular: Normal rate, regular rhythm, normal heart sounds and intact distal pulses.  Exam reveals no gallop and no friction rub.   No murmur heard. Pulmonary/Chest: Effort normal and breath sounds normal. No respiratory distress. He has no wheezes. He has no rales. He exhibits no tenderness.  Abdominal: Soft. Bowel sounds are normal. He exhibits no distension and no mass. There is no tenderness. There is no rebound and no guarding.  Musculoskeletal: Normal range of motion. He exhibits no edema or tenderness.  Lymphadenopathy:    He has no cervical adenopathy.  Neurological: He is alert and oriented to person, place, and time. He has normal reflexes. No cranial nerve deficit. He exhibits normal muscle tone. He displays a negative Romberg sign. Coordination and gait normal.  No meningeal signs  Skin: Skin is warm and dry. No rash noted.  Psychiatric: He has a normal mood and affect. His behavior is normal. Judgment and thought content normal.  HR is ok Moles on face B ingrown toenails, thick big toes   Lab Results  Component Value Date   WBC 10.3 08/28/2012   HGB 15.6 08/28/2012   HCT 46.5 08/28/2012   PLT 250.0 08/28/2012   GLUCOSE 88 09/12/2013   CHOL 218* 09/12/2013   TRIG 190.0* 09/12/2013   HDL 36.50* 09/12/2013   LDLDIRECT 111.2 08/28/2012   LDLCALC 144* 09/12/2013   ALT  26 09/12/2013   AST 18 09/12/2013   NA 139 09/12/2013   K 4.4 09/12/2013   CL 107 09/12/2013   CREATININE 1.6* 09/12/2013   BUN 22 09/12/2013   CO2 26 09/12/2013   TSH 3.31 03/16/2013   PSA 1.17 03/16/2013   INR 1.0 ratio 02/25/2009         Assessment & Plan:

## 2014-03-18 NOTE — Assessment & Plan Note (Addendum)
03/16/14 Re-start ASA; consider Plavix Repeat Carotid doppler Brain MRI Neurol ref was recomended

## 2014-03-18 NOTE — Assessment & Plan Note (Signed)
Discussed w/pt and dtr He is depressed - he declined Rx before

## 2014-03-18 NOTE — Telephone Encounter (Signed)
Patient is experiencing several stroke- like symptoms. Daughter [name] wants to get him in to be seen. He is not able to make it in this morning because she is at work. There is a 2:45 acute slot open, but I wanted to make sure that we can fit a visit of this nature into a 15 minute window. Please advise.

## 2014-03-19 ENCOUNTER — Telehealth: Payer: Self-pay | Admitting: Internal Medicine

## 2014-03-19 DIAGNOSIS — G459 Transient cerebral ischemic attack, unspecified: Secondary | ICD-10-CM

## 2014-03-19 NOTE — Telephone Encounter (Signed)
Pt daughter called and wants pt to MRI done at [facility] .  She did find out they do MRIs on the head and she would like that order put in

## 2014-03-19 NOTE — Telephone Encounter (Signed)
Patient works at [employer]. Once referral is placed, please let me know and I will assist with pushing through. She is scheduling hm for an MRI at that site for this Saturday

## 2014-03-21 NOTE — Telephone Encounter (Signed)
OK. Thx

## 2014-03-21 NOTE — Assessment & Plan Note (Signed)
Continue with current prescription therapy as reflected on the Med list.  

## 2014-03-21 NOTE — Assessment & Plan Note (Signed)
Repeat doppler US ASA Simvastatin po

## 2014-03-21 NOTE — Addendum Note (Signed)
Addended by: Cassandria Anger on: 03/21/2014 07:35 AM   Modules accepted: Orders

## 2014-03-22 ENCOUNTER — Telehealth: Payer: Self-pay | Admitting: *Deleted

## 2014-03-22 DIAGNOSIS — G459 Transient cerebral ischemic attack, unspecified: Secondary | ICD-10-CM

## 2014-03-22 NOTE — Telephone Encounter (Signed)
It is in the works AP

## 2014-03-22 NOTE — Telephone Encounter (Signed)
Pt's daughter is requesting order/referral for neurologist re: memory loss and the U/S to check his main artery for blockages. Carotid US?? Please advise

## 2014-03-25 ENCOUNTER — Other Ambulatory Visit (INDEPENDENT_AMBULATORY_CARE_PROVIDER_SITE_OTHER): Payer: Medicare Other

## 2014-03-25 DIAGNOSIS — E038 Other specified hypothyroidism: Secondary | ICD-10-CM | POA: Diagnosis not present

## 2014-03-25 DIAGNOSIS — E034 Atrophy of thyroid (acquired): Secondary | ICD-10-CM | POA: Diagnosis not present

## 2014-03-25 DIAGNOSIS — R972 Elevated prostate specific antigen [PSA]: Secondary | ICD-10-CM | POA: Diagnosis not present

## 2014-03-25 DIAGNOSIS — I10 Essential (primary) hypertension: Secondary | ICD-10-CM | POA: Diagnosis not present

## 2014-03-25 DIAGNOSIS — R453 Demoralization and apathy: Secondary | ICD-10-CM

## 2014-03-25 DIAGNOSIS — G459 Transient cerebral ischemic attack, unspecified: Secondary | ICD-10-CM

## 2014-03-25 LAB — URINALYSIS, ROUTINE W REFLEX MICROSCOPIC
Bilirubin Urine: NEGATIVE
Hgb urine dipstick: NEGATIVE
Ketones, ur: NEGATIVE
Leukocytes, UA: NEGATIVE
Nitrite: NEGATIVE
RBC / HPF: NONE SEEN
Specific Gravity, Urine: 1.01
Total Protein, Urine: 30 — AB
Urine Glucose: NEGATIVE
Urobilinogen, UA: 0.2
pH: 5.5 (ref 5.0–8.0)

## 2014-03-25 LAB — CBC WITH DIFFERENTIAL/PLATELET
Basophils Absolute: 0 10*3/uL (ref 0.0–0.1)
Basophils Relative: 0.3 % (ref 0.0–3.0)
Eosinophils Absolute: 0.4 10*3/uL (ref 0.0–0.7)
Eosinophils Relative: 3.7 % (ref 0.0–5.0)
HCT: 44.3 % (ref 39.0–52.0)
Hemoglobin: 14.9 g/dL (ref 13.0–17.0)
LYMPHS ABS: 2 10*3/uL (ref 0.7–4.0)
LYMPHS PCT: 20.2 % (ref 12.0–46.0)
MCHC: 33.7 g/dL (ref 30.0–36.0)
MCV: 84.8 fl (ref 78.0–100.0)
MONOS PCT: 8.6 % (ref 3.0–12.0)
Monocytes Absolute: 0.9 10*3/uL (ref 0.1–1.0)
NEUTROS ABS: 6.7 10*3/uL (ref 1.4–7.7)
NEUTROS PCT: 67.2 % (ref 43.0–77.0)
PLATELETS: 281 10*3/uL (ref 150.0–400.0)
RBC: 5.22 Mil/uL (ref 4.22–5.81)
RDW: 12.9 % (ref 11.5–15.5)
WBC: 10 10*3/uL (ref 4.0–10.5)

## 2014-03-25 LAB — HEPATIC FUNCTION PANEL
ALBUMIN: 4 g/dL (ref 3.5–5.2)
ALT: 24 U/L (ref 0–53)
AST: 16 U/L (ref 0–37)
Alkaline Phosphatase: 53 U/L (ref 39–117)
BILIRUBIN TOTAL: 0.7 mg/dL (ref 0.2–1.2)
Bilirubin, Direct: 0.1 mg/dL (ref 0.0–0.3)
TOTAL PROTEIN: 6.4 g/dL (ref 6.0–8.3)

## 2014-03-25 LAB — TSH: TSH: 2.44 u[IU]/mL (ref 0.35–4.50)

## 2014-03-25 LAB — LIPID PANEL
Cholesterol: 193 mg/dL (ref 0–200)
HDL: 32.8 mg/dL — ABNORMAL LOW
NonHDL: 160.2
Total CHOL/HDL Ratio: 6
Triglycerides: 314 mg/dL — ABNORMAL HIGH (ref 0.0–149.0)
VLDL: 62.8 mg/dL — ABNORMAL HIGH (ref 0.0–40.0)

## 2014-03-25 LAB — BASIC METABOLIC PANEL WITH GFR
BUN: 25 mg/dL — ABNORMAL HIGH (ref 6–23)
CO2: 24 meq/L (ref 19–32)
Calcium: 9.7 mg/dL (ref 8.4–10.5)
Chloride: 105 meq/L (ref 96–112)
Creatinine, Ser: 1.45 mg/dL (ref 0.40–1.50)
GFR: 50.21 mL/min — ABNORMAL LOW
Glucose, Bld: 84 mg/dL (ref 70–99)
Potassium: 4.8 meq/L (ref 3.5–5.1)
Sodium: 135 meq/L (ref 135–145)

## 2014-03-25 LAB — PSA: PSA: 2.15 ng/mL (ref 0.10–4.00)

## 2014-03-25 LAB — LDL CHOLESTEROL, DIRECT: Direct LDL: 123 mg/dL

## 2014-03-25 NOTE — Telephone Encounter (Signed)
Labs entered. Pt's daughter informed.

## 2014-03-25 NOTE — Telephone Encounter (Signed)
Kenneth Williams is asking for blood work to be ordered for this morning since the patient is having mri w/ contrast.

## 2014-03-26 DIAGNOSIS — R413 Other amnesia: Secondary | ICD-10-CM | POA: Diagnosis not present

## 2014-03-27 ENCOUNTER — Ambulatory Visit (HOSPITAL_COMMUNITY): Payer: Medicare Other | Attending: Internal Medicine | Admitting: *Deleted

## 2014-03-27 DIAGNOSIS — I6523 Occlusion and stenosis of bilateral carotid arteries: Secondary | ICD-10-CM | POA: Insufficient documentation

## 2014-03-27 DIAGNOSIS — G459 Transient cerebral ischemic attack, unspecified: Secondary | ICD-10-CM

## 2014-03-27 NOTE — Progress Notes (Signed)
Carotid Duplex Exam Performed 

## 2014-03-28 ENCOUNTER — Ambulatory Visit: Payer: Medicare Other | Admitting: Neurology

## 2014-03-28 ENCOUNTER — Encounter: Payer: Self-pay | Admitting: Neurology

## 2014-03-28 ENCOUNTER — Ambulatory Visit (INDEPENDENT_AMBULATORY_CARE_PROVIDER_SITE_OTHER): Payer: Medicare Other | Admitting: Neurology

## 2014-03-28 VITALS — BP 131/65 | HR 59 | Ht 73.0 in | Wt 202.0 lb

## 2014-03-28 DIAGNOSIS — R5382 Chronic fatigue, unspecified: Secondary | ICD-10-CM

## 2014-03-28 DIAGNOSIS — G451 Carotid artery syndrome (hemispheric): Secondary | ICD-10-CM

## 2014-03-28 DIAGNOSIS — R413 Other amnesia: Secondary | ICD-10-CM

## 2014-03-28 DIAGNOSIS — R5381 Other malaise: Secondary | ICD-10-CM

## 2014-03-28 MED ORDER — RIVASTIGMINE 4.6 MG/24HR TD PT24: 4.6000 mg | MEDICATED_PATCH | Freq: Every day | TRANSDERMAL | Status: DC

## 2014-03-28 NOTE — Progress Notes (Signed)
Reason for visit: Memory disturbance  Kenneth Williams is a 79 y.o. male  History of present illness:  Kenneth Williams is a 79 year old right-handed white male with a history of progressive memory changes over the last 2 years. The daughter believes that the memory has clearly worsened over this period time, but there are other features that may be associated with depression. The patient lost his wife in July 2015. The patient has had some increased problems with withdrawal, listlessness, and fatigue. The patient is not sleeping well because of benign prostate hypertrophy, he gets up frequently at night to use the bathroom. The patient has had troubles with word finding and remembering recent events, but he functions otherwise fairly normally. He indicates that he operates a motor vehicle without problem, he is able to pay the bills and manage the finances. He keeps up with his appointments and his medications. He has decreased motivation, and he gets things done only when there is a specific deadline, otherwise he never gets around to doing anything. The patient had an event around 04/16/2014 where he had visual distortion off to the right seeing optics moving, the left side of the visual field was stable. This lasted 5 or 6 seconds, and then the patient developed word finding problems that lasted about 2 hours. The patient also developed a headache. He denies any pre-existing history of migraine. Headaches are unusual for him. He has undergone MRI evaluation of the brain, the disc is brought in for my review. This shows minimal small vessel disease, no acute changes. A carotid Doppler study was unremarkable. He has not had a 2-D echocardiogram. He is on a full adult aspirin. In the past, he has been on Exelon patch, but he went off the medication because he did not believe that this was helpful. He was only on the medication for several months. In the past he has not been able to tolerate Aricept due to  stomach upset. He is sent to this office for further evaluation.  Past Medical History  Diagnosis Date  . History of diverticulitis of colon   . Hyperlipidemia   . Hypertension   . Personal history of hyperthyroidism 2006    s/p 131I- Dr. Chalmers Cater, Hyperthyroid  . History of pancreatitis   . History of TIAs   . BPH (benign prostatic hyperplasia)   . Hypothyroidism   . Elevated PSA     history of   . TGA (transient global amnesia)   . Hypothyroidism   . Left ear hearing loss     wears right hearing aid  . Gout     2009  . CAD (coronary artery disease)     cath 2011-mild non obstructive  . Renal insufficiency 2011    Past Surgical History  Procedure Laterality Date  . Cholecystectomy    . Colonoscopy    . Stapedes surgery      LEFT EAR    Family History  Problem Relation Age of Onset  . Hypertension Other   . Colon cancer Neg Hx   . Stomach cancer Neg Hx   . Esophageal cancer Neg Hx   . Rectal cancer Neg Hx   . Heart disease Sister   . Dementia Mother     Social history:  reports that he has never smoked. He has never used smokeless tobacco. He reports that he does not drink alcohol or use illicit drugs.  Medications:  Current Outpatient Prescriptions on File Prior to Visit  Medication  Sig Dispense Refill  . aspirin (BAYER ASPIRIN) 325 MG tablet Take 1 tablet (325 mg total) by mouth daily. 100 tablet 3  . cholecalciferol (VITAMIN D) 1000 UNITS tablet Take 2,000 Units by mouth daily.      . colchicine 0.6 MG tablet Take 1 tablet (0.6 mg total) by mouth daily as needed. 90 tablet 3  . Cyanocobalamin (VITAMIN B 12 PO) Take 1 tablet by mouth daily.      Marland Kitchen escitalopram (LEXAPRO) 10 MG tablet Take 1 tablet (10 mg total) by mouth daily. 30 tablet 3  . labetalol (NORMODYNE) 200 MG tablet Take 2 tablets (400 mg total) by mouth 2 (two) times daily. 120 tablet 6  . levothyroxine (SYNTHROID, LEVOTHROID) 50 MCG tablet TAKE 1 TABLET EVERY DAY BEFORE BREAKFAST 90 tablet 5  .  losartan (COZAAR) 100 MG tablet Take 1 tablet (100 mg total) by mouth daily. 90 tablet 3  . simvastatin (ZOCOR) 40 MG tablet TAKE 1 TABLET AT BEDTIME 90 tablet 3  . spironolactone (ALDACTONE) 25 MG tablet Take 0.5 tablets (12.5 mg total) by mouth daily. 45 tablet 2   No current facility-administered medications on file prior to visit.      Allergies  Allergen Reactions  . Coreg [Carvedilol] Diarrhea    Diarrhea and bradycardia  . Donepezil Hydrochloride Diarrhea    REACTION: diarrhea  . Hydrochlorothiazide Other (See Comments)    REACTION: Gout  . Razadyne [Galantamine Hydrobromide] Diarrhea    diarrhea    ROS:  Out of a complete 14 system review of symptoms, the patient complains only of the following symptoms, and all other reviewed systems are negative.  Hearing loss Visual distortion Diarrhea Urination problems Memory loss  Blood pressure 131/65, pulse 59, height 6\' 1"  (1.854 m), weight 202 lb (91.627 kg).  Physical Exam  General: The patient is alert and cooperative at the time of the examination.  Eyes: Pupils are equal, round, and reactive to light. Discs are flat bilaterally.  Neck: The neck is supple, no carotid bruits are noted.  Respiratory: The respiratory examination is clear.  Cardiovascular: The cardiovascular examination reveals a regular rate and rhythm, no obvious murmurs or rubs are noted.  Skin: Extremities are without significant edema.  Neurologic Exam  Mental status: The patient is alert and oriented x 3 at the time of the examination. The patient has apparent normal recent and remote memory, with an apparently normal attention span and concentration ability. MOCA done today shows a score of 29/30.  Cranial nerves: Facial symmetry is present. There is good sensation of the face to pinprick and soft touch bilaterally. The strength of the facial muscles and the muscles to head turning and shoulder shrug are normal bilaterally. Speech is well  enunciated, no aphasia or dysarthria is noted. Extraocular movements are full. Visual fields are full. The tongue is midline, and the patient has symmetric elevation of the soft palate. No obvious hearing deficits are noted.  Motor: The motor testing reveals 5 over 5 strength of all 4 extremities. Good symmetric motor tone is noted throughout.  Sensory: Sensory testing is intact to pinprick, soft touch, vibration sensation, and position sense on all 4 extremities. No evidence of extinction is noted.  Coordination: Cerebellar testing reveals good finger-nose-finger and heel-to-shin bilaterally.  Gait and station: Gait is normal. Tandem gait is normal. Romberg is negative. No drift is seen.  Reflexes: Deep tendon reflexes are symmetric and normal bilaterally. Toes are downgoing bilaterally.   Assessment/Plan:  1. Memory disturbance,  minimum cognitive impairment  2. TIA  The patient will be sent for a 2-D echocardiogram. He will remain on aspirin. We will restart the Exelon patch at the low dose, 4.6 mg, after one month if he is tolerating this, we will go up to the 9.5 patch. He will undergo blood work today. He will be sent for formal neuropsychological testing to help exclude depression as an underlying etiology for some memory issues. He will follow-up in 6 months.  Jill Alexanders MD 03/28/2014 9:17 PM  [facility] [address] [place], [place] [address]  Phone [phone] Fax [phone]

## 2014-03-28 NOTE — Patient Instructions (Addendum)
We will set you up for a 2-D echocardiogram as a workup for a TIA event. We will get a referral to get a neuropsychological evaluation. I have called in a prescription for Exelon, call me in one month if you're tolerating the medication, we will go up on the dose. Blood work will be done today.  Transient Ischemic Attack A transient ischemic attack (TIA) is a "warning stroke" that causes stroke-like symptoms. Unlike a stroke, a TIA does not cause permanent damage to the brain. The symptoms of a TIA can happen very fast and do not last long. It is important to know the symptoms of a TIA and what to do. This can help prevent a major stroke or death. CAUSES   A TIA is caused by a temporary blockage in an artery in the brain or neck (carotid artery). The blockage does not allow the brain to get the blood supply it needs and can cause different symptoms. The blockage can be caused by either:  A blood clot.  Fatty buildup (plaque) in a neck or brain artery. RISK FACTORS  High blood pressure (hypertension).  High cholesterol.  Diabetes mellitus.  Heart disease.  The build up of plaque in the blood vessels (peripheral artery disease or atherosclerosis).  The build up of plaque in the blood vessels providing blood and oxygen to the brain (carotid artery stenosis).  An abnormal heart rhythm (atrial fibrillation).  Obesity.  Smoking.  Taking oral contraceptives (especially in combination with smoking).  Physical inactivity.  A diet high in fats, salt (sodium), and calories.  Alcohol use.  Use of illegal drugs (especially cocaine and methamphetamine).  Being male.  Being African American.  Being over the age of 72.  Family history of stroke.  Previous history of blood clots, stroke, TIA, or heart attack.  Sickle cell disease. SYMPTOMS  TIA symptoms are the same as a stroke but are temporary. These symptoms usually develop suddenly, or may be newly present upon awakening  from sleep:  Sudden weakness or numbness of the face, arm, or leg, especially on one side of the body.  Sudden trouble walking or difficulty moving arms or legs.  Sudden confusion.  Sudden personality changes.  Trouble speaking (aphasia) or understanding.  Difficulty swallowing.  Sudden trouble seeing in one or both eyes.  Double vision.  Dizziness.  Loss of balance or coordination.  Sudden severe headache with no known cause.  Trouble reading or writing.  Loss of bowel or bladder control.  Loss of consciousness. DIAGNOSIS  Your caregiver may be able to determine the presence or absence of a TIA based on your symptoms, history, and physical exam. Computed tomography (CT scan) of the brain is usually performed to help identify a TIA. Other tests may be done to diagnose a TIA. These tests may include:  Electrocardiography.  Continuous heart monitoring.  Echocardiography.  Carotid ultrasonography.  Magnetic resonance imaging (MRI).  A scan of the brain circulation.  Blood tests. PREVENTION  The risk of a TIA can be decreased by appropriately treating high blood pressure, high cholesterol, diabetes, heart disease, and obesity and by quitting smoking, limiting alcohol, and staying physically active. TREATMENT  Time is of the essence. Since the symptoms of TIA are the same as a stroke, it is important to seek treatment as soon as possible because you may need a medicine to dissolve the clot (thrombolytic) that cannot be given if too much time has passed. Treatment options vary. Treatment options may include rest, oxygen,  intravenous (IV) fluids, and medicines to thin the blood (anticoagulants). Medicines and diet may be used to address diabetes, high blood pressure, and other risk factors. Measures will be taken to prevent short-term and long-term complications, including infection from breathing foreign material into the lungs (aspiration pneumonia), blood clots in the  legs, and falls. Treatment options include procedures to either remove plaque in the carotid arteries or dilate carotid arteries that have narrowed due to plaque. Those procedures are:  Carotid endarterectomy.  Carotid angioplasty and stenting. HOME CARE INSTRUCTIONS   Take all medicines prescribed by your caregiver. Follow the directions carefully. Medicines may be used to control risk factors for a stroke. Be sure you understand all your medicine instructions.  You may be told to take aspirin or the anticoagulant warfarin. Warfarin needs to be taken exactly as instructed.  Taking too much or too little warfarin is dangerous. Too much warfarin increases the risk of bleeding. Too little warfarin continues to allow the risk for blood clots. While taking warfarin, you will need to have regular blood tests to measure your blood clotting time. A PT blood test measures how long it takes for blood to clot. Your PT is used to calculate another value called an INR. Your PT and INR help your caregiver to adjust your dose of warfarin. The dose can change for many reasons. It is critically important that you take warfarin exactly as prescribed.  Many foods, especially foods high in vitamin K can interfere with warfarin and affect the PT and INR. Foods high in vitamin K include spinach, kale, broccoli, cabbage, collard and turnip greens, brussels sprouts, peas, cauliflower, seaweed, and parsley as well as beef and pork liver, green tea, and soybean oil. You should eat a consistent amount of foods high in vitamin K. Avoid major changes in your diet, or notify your caregiver before changing your diet. Arrange a visit with a dietitian to answer your questions.  Many medicines can interfere with warfarin and affect the PT and INR. You must tell your caregiver about any and all medicines you take, this includes all vitamins and supplements. Be especially cautious with aspirin and anti-inflammatory medicines. Do not  take or discontinue any prescribed or over-the-counter medicine except on the advice of your caregiver or pharmacist.  Warfarin can have side effects, such as excessive bruising or bleeding. You will need to hold pressure over cuts for longer than usual. Your caregiver or pharmacist will discuss other potential side effects.  Avoid sports or activities that may cause injury or bleeding.  Be mindful when shaving, flossing your teeth, or handling sharp objects.  Alcohol can change the body's ability to handle warfarin. It is best to avoid alcoholic drinks or consume only very small amounts while taking warfarin. Notify your caregiver if you change your alcohol intake.  Notify your dentist or other caregivers before procedures.  Eat a diet that includes 5 or more servings of fruits and vegetables each day. This may reduce the risk of stroke. Certain diets may be prescribed to address high blood pressure, high cholesterol, diabetes, or obesity.  A low-sodium, low-saturated fat, low-trans fat, low-cholesterol diet is recommended to manage high blood pressure.  A low-saturated fat, low-trans fat, low-cholesterol, and high-fiber diet may control cholesterol levels.  A controlled-carbohydrate, controlled-sugar diet is recommended to manage diabetes.  A reduced-calorie, low-sodium, low-saturated fat, low-trans fat, low-cholesterol diet is recommended to manage obesity.  Maintain a healthy weight.  Stay physically active. It is recommended that you get  at least 30 minutes of activity on most or all days.  Do not smoke.  Limit alcohol use even if you are not taking warfarin. Moderate alcohol use is considered to be:  No more than 2 drinks each day for men.  No more than 1 drink each day for nonpregnant women.  Stop drug abuse.  Home safety. A safe home environment is important to reduce the risk of falls. Your caregiver may arrange for specialists to evaluate your home. Having grab bars in  the bedroom and bathroom is often important. Your caregiver may arrange for equipment to be used at home, such as raised toilets and a seat for the shower.  Follow all instructions for follow-up with your caregiver. This is very important. This includes any referrals and lab tests. Proper follow up can prevent a stroke or another TIA from occurring. SEEK MEDICAL CARE IF:  You have personality changes.  You have difficulty swallowing.  You are seeing double.  You have dizziness.  You have a fever.  You have skin breakdown. SEEK IMMEDIATE MEDICAL CARE IF:  Any of these symptoms may represent a serious problem that is an emergency. Do not wait to see if the symptoms will go away. Get medical help right away. Call your local emergency services (911 in [place] Do not drive yourself to the hospital.  You have sudden weakness or numbness of the face, arm, or leg, especially on one side of the body.  You have sudden trouble walking or difficulty moving arms or legs.  You have sudden confusion.  You have trouble speaking (aphasia) or understanding.  You have sudden trouble seeing in one or both eyes.  You have a loss of balance or coordination.  You have a sudden, severe headache with no known cause.  You have new chest pain or an irregular heartbeat.  You have a partial or total loss of consciousness. MAKE SURE YOU:   Understand these instructions.  Will watch your condition.  Will get help right away if you are not doing well or get worse. Document Released: 11/18/2004 Document Revised: 02/13/2013 Document Reviewed: 05/16/2013 [facility] Patient Information 2015 [place], [place]. This information is not intended to replace advice given to you by your health care provider. Make sure you discuss any questions you have with your health care provider.

## 2014-03-29 LAB — SPECIMEN STATUS REPORT

## 2014-04-02 LAB — COPPER, SERUM: Copper: 80 ug/dL (ref 72–166)

## 2014-04-02 LAB — TESTOSTERONE: Testosterone: 302 ng/dL — ABNORMAL LOW (ref 348–1197)

## 2014-04-02 LAB — RPR: RPR Ser Ql: NONREACTIVE

## 2014-04-08 ENCOUNTER — Ambulatory Visit: Payer: Medicare Other | Admitting: Internal Medicine

## 2014-04-08 ENCOUNTER — Encounter: Payer: Medicare Other | Admitting: Internal Medicine

## 2014-04-11 ENCOUNTER — Telehealth: Payer: Self-pay | Admitting: Neurology

## 2014-04-11 ENCOUNTER — Ambulatory Visit (HOSPITAL_COMMUNITY): Payer: Medicare Other | Attending: Neurology | Admitting: Radiology

## 2014-04-11 DIAGNOSIS — I6529 Occlusion and stenosis of unspecified carotid artery: Secondary | ICD-10-CM | POA: Insufficient documentation

## 2014-04-11 DIAGNOSIS — R413 Other amnesia: Secondary | ICD-10-CM

## 2014-04-11 DIAGNOSIS — G459 Transient cerebral ischemic attack, unspecified: Secondary | ICD-10-CM

## 2014-04-11 DIAGNOSIS — G451 Carotid artery syndrome (hemispheric): Secondary | ICD-10-CM

## 2014-04-11 DIAGNOSIS — I10 Essential (primary) hypertension: Secondary | ICD-10-CM | POA: Diagnosis not present

## 2014-04-11 DIAGNOSIS — E785 Hyperlipidemia, unspecified: Secondary | ICD-10-CM | POA: Insufficient documentation

## 2014-04-11 NOTE — Telephone Encounter (Signed)
  I called the patient. No cardiogenic source of embolus by 2-D echo cardiogram.  2-D echocardiogram 04/11/2014:  Study Conclusions  - Left ventricle: The cavity size was normal. Wall thickness was normal. Systolic function was normal. The estimated ejection fraction was in the range of 60% to 65%. Wall motion was normal; there were no regional wall motion abnormalities. Doppler parameters are consistent with abnormal left ventricular relaxation (grade 1 diastolic dysfunction). Doppler parameters are consistent with high ventricular filling pressure.  Impressions:  - Normal LV function; grade 1 diastolic dysfunction; trace MR and TR.,

## 2014-04-11 NOTE — Progress Notes (Signed)
Echocardiogram performed.  

## 2014-04-15 ENCOUNTER — Ambulatory Visit (INDEPENDENT_AMBULATORY_CARE_PROVIDER_SITE_OTHER): Payer: Medicare Other | Admitting: Internal Medicine

## 2014-04-15 ENCOUNTER — Encounter: Payer: Self-pay | Admitting: Internal Medicine

## 2014-04-15 VITALS — BP 160/90 | HR 65 | Temp 98.5°F

## 2014-04-15 DIAGNOSIS — G459 Transient cerebral ischemic attack, unspecified: Secondary | ICD-10-CM

## 2014-04-15 DIAGNOSIS — E038 Other specified hypothyroidism: Secondary | ICD-10-CM

## 2014-04-15 DIAGNOSIS — I10 Essential (primary) hypertension: Secondary | ICD-10-CM

## 2014-04-15 DIAGNOSIS — B351 Tinea unguium: Secondary | ICD-10-CM

## 2014-04-15 DIAGNOSIS — R413 Other amnesia: Secondary | ICD-10-CM | POA: Diagnosis not present

## 2014-04-15 DIAGNOSIS — E034 Atrophy of thyroid (acquired): Secondary | ICD-10-CM

## 2014-04-15 HISTORY — DX: Tinea unguium: B35.1

## 2014-04-15 MED ORDER — RIVASTIGMINE 4.6 MG/24HR TD PT24: 4.6000 mg | MEDICATED_PATCH | Freq: Every day | TRANSDERMAL | Status: DC

## 2014-04-15 NOTE — Assessment & Plan Note (Signed)
Continue with current prescription therapy as reflected on the Med list. F/u w/Dr Jannifer Franklin

## 2014-04-15 NOTE — Patient Instructions (Signed)
   Milk free trial (no milk, ice cream, cheese and yogurt) for 4-6 weeks. OK to use almond, coconut, rice milk. "Almond breeze" brand tastes good.  

## 2014-04-15 NOTE — Progress Notes (Signed)
Subjective:    Hypertension Pertinent negatives include no chest pain, neck pain or palpitations.   F/u on diarrhea    F/u on edema B legs - better, not well The patient is here to follow up on chronic memory loss - worse, moderate BPH symptoms - worse, HTN, controlled partially with medicines, diet and exercise.   Wt Readings from Last 3 Encounters:  03/28/14 202 lb (91.627 kg)  03/18/14 202 lb (91.627 kg)  01/01/14 206 lb (93.441 kg)   BP Readings from Last 3 Encounters:  04/15/14 160/90  03/28/14 131/65  03/18/14 146/84     Review of Systems  Constitutional: Negative for appetite change, fatigue and unexpected weight change.  HENT: Positive for hearing loss. Negative for congestion, ear discharge, ear pain, nosebleeds, sneezing, sore throat and trouble swallowing.   Eyes: Negative for itching and visual disturbance.  Respiratory: Negative for cough.   Cardiovascular: Negative for chest pain, palpitations and leg swelling.  Gastrointestinal: Negative for nausea, diarrhea, blood in stool and abdominal distention.  Genitourinary: Positive for decreased urine volume. Negative for frequency and hematuria.  Musculoskeletal: Negative for back pain, joint swelling, gait problem, neck pain and neck stiffness.  Skin: Negative for rash.  Neurological: Negative for dizziness, tremors, speech difficulty and weakness.  Psychiatric/Behavioral: Negative for suicidal ideas, sleep disturbance, dysphoric mood and agitation. The patient is not nervous/anxious.        Objective:   Physical Exam  Constitutional: He is oriented to person, place, and time. He appears well-developed. No distress.  NAD  HENT:  Mouth/Throat: Oropharynx is clear and moist.  Eyes: Conjunctivae are normal. Pupils are equal, round, and reactive to light.  Neck: Normal range of motion. No JVD present. No thyromegaly present.  Cardiovascular: Normal rate, regular rhythm, normal heart sounds and intact distal  pulses.  Exam reveals no gallop and no friction rub.   No murmur heard. Pulmonary/Chest: Effort normal and breath sounds normal. No respiratory distress. He has no wheezes. He has no rales. He exhibits no tenderness.  Abdominal: Soft. Bowel sounds are normal. He exhibits no distension and no mass. There is no tenderness. There is no rebound and no guarding.  Musculoskeletal: Normal range of motion. He exhibits no edema or tenderness.  Lymphadenopathy:    He has no cervical adenopathy.  Neurological: He is alert and oriented to person, place, and time. He has normal reflexes. No cranial nerve deficit. He exhibits normal muscle tone. He displays a negative Romberg sign. Coordination and gait normal.  No meningeal signs  Skin: Skin is warm and dry. No rash noted.  Psychiatric: He has a normal mood and affect. His behavior is normal. Judgment and thought content normal.  HR is ok Moles on back AKs B ingrown toenails, thick nail B big toes + others  Lab Results  Component Value Date   WBC 10.0 03/25/2014   HGB 14.9 03/25/2014   HCT 44.3 03/25/2014   PLT 281.0 03/25/2014   GLUCOSE 84 03/25/2014   CHOL 193 03/25/2014   TRIG 314.0* 03/25/2014   HDL 32.80* 03/25/2014   LDLDIRECT 123.0 03/25/2014   LDLCALC 144* 09/12/2013   ALT 24 03/25/2014   AST 16 03/25/2014   NA 135 03/25/2014   K 4.8 03/25/2014   CL 105 03/25/2014   CREATININE 1.45 03/25/2014   BUN 25* 03/25/2014   CO2 24 03/25/2014   TSH 2.44 03/25/2014   PSA 2.15 03/25/2014   INR 1.0 ratio 02/25/2009      Procedure  Note :     Procedure : Cryosurgery   Indication:  Wart(s)  Actinic keratosis(es)   Risks including unsuccessful procedure , bleeding, infection, bruising, scar, a need for a repeat  procedure and others were explained to the patient in detail as well as the benefits. Informed consent was obtained verbally.     lesion(s)  on    was/were treated with liquid nitrogen on a Q-tip in a usual fasion . Band-Aid was  applied and antibiotic ointment was given for a later use.   Tolerated well. Complications none.   Postprocedure instructions :     Keep the wounds clean. You can wash them with liquid soap and water. Pat dry with gauze or a Kleenex tissue  Before applying antibiotic ointment and a Band-Aid.   You need to report immediately  if  any signs of infection develop.       Assessment & Plan:

## 2014-04-15 NOTE — Assessment & Plan Note (Signed)
Continue with current prescription therapy as reflected on the Med list.  

## 2014-04-15 NOTE — Assessment & Plan Note (Signed)
CFS 

## 2014-04-15 NOTE — Assessment & Plan Note (Signed)
Will consult Dr Blenda Mounts - ?removal

## 2014-04-15 NOTE — Progress Notes (Signed)
Pre visit review using our clinic review tool, if applicable. No additional management support is needed unless otherwise documented below in the visit note. 

## 2014-04-15 NOTE — Assessment & Plan Note (Signed)
2016 Dr Jannifer Franklin - mild cognitive deficit Neuro-psych testing was ordered

## 2014-04-16 ENCOUNTER — Telehealth: Payer: Self-pay | Admitting: Internal Medicine

## 2014-04-16 NOTE — Telephone Encounter (Signed)
emmi emailed °

## 2014-04-23 ENCOUNTER — Telehealth: Payer: Self-pay

## 2014-04-23 NOTE — Telephone Encounter (Signed)
04/23/14 Disc Received from [employer] and filed on shelf.

## 2014-04-24 ENCOUNTER — Encounter: Payer: Self-pay | Admitting: Internal Medicine

## 2014-04-24 ENCOUNTER — Ambulatory Visit (INDEPENDENT_AMBULATORY_CARE_PROVIDER_SITE_OTHER): Payer: Medicare Other | Admitting: Internal Medicine

## 2014-04-24 VITALS — BP 170/90 | HR 58 | Temp 97.9°F | Wt 199.2 lb

## 2014-04-24 DIAGNOSIS — L570 Actinic keratosis: Secondary | ICD-10-CM | POA: Diagnosis not present

## 2014-04-24 DIAGNOSIS — D485 Neoplasm of uncertain behavior of skin: Secondary | ICD-10-CM | POA: Diagnosis not present

## 2014-04-24 DIAGNOSIS — D225 Melanocytic nevi of trunk: Secondary | ICD-10-CM | POA: Diagnosis not present

## 2014-04-24 DIAGNOSIS — R197 Diarrhea, unspecified: Secondary | ICD-10-CM

## 2014-04-24 DIAGNOSIS — R413 Other amnesia: Secondary | ICD-10-CM

## 2014-04-24 DIAGNOSIS — D489 Neoplasm of uncertain behavior, unspecified: Secondary | ICD-10-CM

## 2014-04-24 MED ORDER — RIVASTIGMINE TARTRATE 3 MG PO CAPS: 3.0000 mg | ORAL_CAPSULE | Freq: Two times a day (BID) | ORAL | Status: DC

## 2014-04-24 NOTE — Assessment & Plan Note (Signed)
Treated today 

## 2014-04-24 NOTE — Patient Instructions (Signed)
Postprocedure instructions :    A Band-Aid should be  changed twice daily. You can take a shower tomorrow.  Keep the wounds clean. You can wash them with liquid soap and water. Pat dry with gauze or a Kleenex tissue  Before applying antibiotic ointment and a Band-Aid.   You need to report immediately  if fever, chills or any signs of infection develop.    The biopsy results should be available in 1 -2 weeks. 

## 2014-04-24 NOTE — Progress Notes (Signed)
Procedure Note :     Procedure :  Skin biopsy   Indication:  Changing mole (s ),  Suspicious lesion(s)   Risks including unsuccessful procedure , bleeding, infection, bruising, scar, a need for another complete procedure and others were explained to the patient in detail as well as the benefits. Informed consent was obtained and signed.   The patient was placed in a decubitus position.  Lesion #1 on  R shoulder blade   measuring  3x4   mm   Skin over lesion #1  was prepped with Betadine and alcohol  and anesthetized with 1 cc of 2% lidocaine and epinephrine, using a 25-gauge 1 inch needle.  Shave biopsy with a sterile Dermablade was carried out in the usual fashion. Hyfrecator was used to destroy the rest of the lesion potentially left behind and for hemostasis. Band-Aid was applied with antibiotic ointment.    Lesion #2 on  R lower back   measuring  3x4 mm   Skin over lesion #2  was prepped with Betadine and alcohol  and anesthetized with 1 cc of 2% lidocaine and epinephrine, using a 25-gauge 1 inch needle.  Shave biopsy with a sterile Dermablade was carried out in the usual fashion. Hyfrecator was used to destroy the rest of the lesion potentially left behind and for hemostasis. Band-Aid was applied with antibiotic ointment.  Lesion #3 on  L posterior upper chest  measuring 3x4  mm   Skin over lesion #3  was prepped with Betadine and alcohol  and anesthetized with 1 cc of 2% lidocaine and epinephrine, using a 25-gauge 1 inch needle.  Shave biopsy with a sterile Dermablade was carried out in the usual fashion. Hyfrecator was used to destroy the rest of the lesion potentially left behind and for hemostasis. Band-Aid was applied with antibiotic ointment.   Tolerated well. Complications none.      Procedure Note :     Procedure : Cryosurgery   Indication:    Actinic keratosis(es)   Risks including unsuccessful procedure , bleeding, infection, bruising, scar, a need for a repeat   procedure and others were explained to the patient in detail as well as the benefits. Informed consent was obtained verbally.    4 lesion(s)  On R ear and the head  was/were treated with liquid nitrogen on a Q-tip in a usual fasion . Band-Aid was applied and antibiotic ointment was given for a later use.   Tolerated well. Complications none.   Postprocedure instructions :     Keep the wounds clean. You can wash them with liquid soap and water. Pat dry with gauze or a Kleenex tissue  Before applying antibiotic ointment and a Band-Aid.   You need to report immediately  if  any signs of infection develop.

## 2014-04-24 NOTE — Assessment & Plan Note (Signed)
Resolved off milk

## 2014-04-24 NOTE — Assessment & Plan Note (Signed)
Pt wants to switch to Exlon po since his diarrhea has resolved off milk

## 2014-04-24 NOTE — Progress Notes (Signed)
Pre visit review using our clinic review tool, if applicable. No additional management support is needed unless otherwise documented below in the visit note. 

## 2014-04-25 ENCOUNTER — Encounter: Payer: Self-pay | Admitting: Internal Medicine

## 2014-05-07 ENCOUNTER — Ambulatory Visit (INDEPENDENT_AMBULATORY_CARE_PROVIDER_SITE_OTHER): Payer: Medicare Other

## 2014-05-07 VITALS — BP 162/69 | HR 65 | Resp 16

## 2014-05-07 DIAGNOSIS — M79676 Pain in unspecified toe(s): Secondary | ICD-10-CM

## 2014-05-07 DIAGNOSIS — B351 Tinea unguium: Secondary | ICD-10-CM | POA: Diagnosis not present

## 2014-05-07 NOTE — Progress Notes (Signed)
   Subjective:    Patient ID: [name], male    DOB: 03-04-1935, 79 y.o.   MRN: [id number]  HPI  Pt presents with bilateral discolored fungal nails  Review of Systems  All other systems reviewed and are negative.      Objective:   Physical Exam 79 year old white male well-developed well-nourished oriented 3 presents with some painful yellow brittle thickened nails 1 through 5 bilateral. Wished to treat him however wish to avoid the oral medications we discussed topicals including OTC over-the-counter antifungals. Should note neurovascular status is intact pedal pulses are palpable epicritic and proprioceptive sensations intact there is normal plantar response and DTRs absent hair growth nails thick brittle yellow friable and criptotic 1 through 5 bilateral.      Assessment & Plan:  Assessment onychomycosis of nails somewhat painful tender at times some a nails are debrided back at this time patient wished treat topically this time recommended OTC Fungi-Nail also given the name of Penlac as alternative if that fails may try prescription Penlac as alternative we briefly discussed Jublia and [place] Mon however was likely noncovered by his Medicare plan. Patient is advised to apply the medicine Fungi-Nail daily for or twice daily for 12 months as instructed maintain debridement of nails and patient is advised to treat all the nails treating one nail and not neighboring nail we'll just allow that infection to transfer back. Patient will follow-up in the future as needed may be candidate for partial nail excision or total nail excision at some point in the future if conservative care fails  Harriet Masson DPM

## 2014-05-07 NOTE — Patient Instructions (Signed)
Onychomycosis/Fungal Toenails  WHAT IS IT? An infection that lies within the keratin of your nail plate that is caused by a fungus.  WHY ME? Fungal infections affect all ages, sexes, races, and creeds.  There may be many factors that predispose you to a fungal infection such as age, coexisting medical conditions such as diabetes, or an autoimmune disease; stress, medications, fatigue, genetics, etc.  Bottom line: fungus thrives in a warm, moist environment and your shoes offer such a location.  IS IT CONTAGIOUS? Theoretically, yes.  You do not want to share shoes, nail clippers or files with someone who has fungal toenails.  Walking around barefoot in the same room or sleeping in the same bed is unlikely to transfer the organism.  It is important to realize, however, that fungus can spread easily from one nail to the next on the same foot.  HOW DO WE TREAT THIS?  There are several ways to treat this condition.  Treatment may depend on many factors such as age, medications, pregnancy, liver and kidney conditions, etc.  It is best to ask your doctor which options are available to you.  1. No treatment.   Unlike many other medical concerns, you can live with this condition.  However for many people this can be a painful condition and may lead to ingrown toenails or a bacterial infection.  It is recommended that you keep the nails cut short to help reduce the amount of fungal nail. 2. Topical treatment.  These range from herbal remedies to prescription strength nail lacquers.  About 40-50% effective, topicals require twice daily application for approximately 9 to 12 months or until an entirely new nail has grown out.  The most effective topicals are medical grade medications available through physicians offices. 3. Oral antifungal medications.  With an 80-90% cure rate, the most common oral medication requires 3 to 4 months of therapy and stays in your system for a year as the new nail grows out.  Oral  antifungal medications do require blood work to make sure it is a safe drug for you.  A liver function panel will be performed prior to starting the medication and after the first month of treatment.  It is important to have the blood work performed to avoid any harmful side effects.  In general, this medication safe but blood work is required. 4. Laser Therapy.  This treatment is performed by applying a specialized laser to the affected nail plate.  This therapy is noninvasive, fast, and non-painful.  It is not covered by insurance and is therefore, out of pocket.  The results have been very good with a 80-95% cure rate.  [facility] is the only practice in the area to offer this therapy. 5. Permanent Nail Avulsion.  Removing the entire nail so that a new nail will not grow back.    Recommendation for treatment with topical antifungal. Can't obtain Fungi-Nail at any pharmacy without prescription. Apply to affected nail twice daily for minimum of 12 months. This goes unlike clear liquid nail polish is water based and washes off with water and needs to be reapplied a time you will bathe or shower  There is a prescription alternative called Penlac nail lacquer that does come generic however needs to be applied once daily for 12 months as instructed however every 7 days he need to strip down the nail utilizing nail polish remover or alcohol as the lacquer will buildup multiple layers of paint.  Regardless of  your choice of treatment it takes a minimum of one year to begin clearing fungus from a nail

## 2014-06-14 DIAGNOSIS — H02836 Dermatochalasis of left eye, unspecified eyelid: Secondary | ICD-10-CM | POA: Diagnosis not present

## 2014-06-14 DIAGNOSIS — H02833 Dermatochalasis of right eye, unspecified eyelid: Secondary | ICD-10-CM | POA: Diagnosis not present

## 2014-06-14 DIAGNOSIS — H25813 Combined forms of age-related cataract, bilateral: Secondary | ICD-10-CM | POA: Diagnosis not present

## 2014-06-18 ENCOUNTER — Ambulatory Visit: Payer: Medicare Other | Attending: Psychology | Admitting: Psychology

## 2014-06-18 DIAGNOSIS — R413 Other amnesia: Secondary | ICD-10-CM | POA: Diagnosis not present

## 2014-06-18 DIAGNOSIS — F329 Major depressive disorder, single episode, unspecified: Secondary | ICD-10-CM

## 2014-06-18 DIAGNOSIS — F32A Depression, unspecified: Secondary | ICD-10-CM

## 2014-06-19 ENCOUNTER — Encounter: Payer: Self-pay | Admitting: Psychology

## 2014-06-19 ENCOUNTER — Ambulatory Visit (INDEPENDENT_AMBULATORY_CARE_PROVIDER_SITE_OTHER): Payer: Medicare Other | Admitting: Psychology

## 2014-06-19 DIAGNOSIS — F329 Major depressive disorder, single episode, unspecified: Secondary | ICD-10-CM | POA: Diagnosis not present

## 2014-06-19 DIAGNOSIS — R413 Other amnesia: Secondary | ICD-10-CM | POA: Diagnosis not present

## 2014-06-19 DIAGNOSIS — F321 Major depressive disorder, single episode, moderate: Secondary | ICD-10-CM

## 2014-06-19 NOTE — Progress Notes (Addendum)
[facility]  [address]   Telephone [phone] [address] Fax [phone] [place], [place] [address]   [place]* This report should not be released without the consent of the client  Name:   Kenneth Williams     Date of Birth:  2035/10/18 [facility] MR#:  [id number] Dates of Evaluation: 06/18/14 & 06/19/14  Reason for Referral Kenneth Williams is a 79 year old right-handed man who was referred for neuropsychological evaluation by [name], MD of [facility] as prompted by Kenneth Williams report of memory difficulties over the past four years. A brain MRI scan on 03/26/14 revealed mild to moderate diffuse cerebral atrophy as well as multiple subcortical white matter hyperintensities. He was put on the Exelon patch in 2013 but discontinued use after a few months as he reportedly did not believe that it was helpful. Exelon was restarted by Dr. Jannifer Franklin in March 2016.  Sources of Information Electronic medical records from the [facility] were reviewed. [name] and his daughter, Kenneth Williams, were interviewed.     Chief Complaints & Background Kenneth Williams reported an approximate three to four year of progressive memory problems. He offered recent examples of pouring himself a cup of coffee only to forget where he had just left it, forgetting that a pan was on his oven burner, not recalling some recent events and having difficulty finding words while talking. He did not report any difficulties taking his medications, driving or managing his financial affairs.  In addition to memory difficulties, he reported feeling listless and lacking the energy or motivation to do much of anything over the past four years, more so since the death of his wife in October 12, 2013. For example, he has left his mail in the mailbox for several days or sat in his car for almost an hour due to lack of motivation. The only  activity that he has been able to consistently accomplish of late has been taking his grandchildren to school. He stated that he spends most of the day in bed. Specifically, he reported experiencing minimal drive, lethargy, low motivation, loss of interest, diminished pleasure, periodic crying (especially when he thinks about his wife), loss of appetite and increased desire to sleep. He did not report feeling hopeless, signs of mood instability, suicidal ideation, problems with impulse control or psychotic symptoms.   He stated that his belief that antidepressant medication (i.e., bupropion from 2012 - 2015; escitalopram since 2016) has not been helpful. He reported that he had not been consistently taking these medications as prescribed over the past few years and that he had had discontinued use of escitalopram about three weeks ago.  His daughter reported that her father had shown a notable decline in his drive and motivation ("like he was going through the motions of life") for at least two years prior to the death of her mother in 12-Oct-2013. She reported that after her mother's death, his motivation and memory seemed to further decline. He has been living independently though she has prompted him to eat regularly and shower. She has not observed him to display any unsafe or unusual behavior.  Other problems included bilateral hearing loss (left worse than right) and sleep disturbance due to frequently having to use the bathroom. Otherwise he did not report problems with sensory-perceptual or physical functioning.   He has been living alone since the death of his wife in 10/12/13. She has three children. His son  has persisting cognitive and physical disabilities due to a brain injury he sustained in 1992.  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 He denied history of school-based attentional or learning problems.   His past medical history was notable for benign prostatic hyperplasia, coronary artery disease, hearing loss, hyperlipidemia, hypertension and hypothyroidism. His medications include aspirin, colchicine, escitalopram, Exelon, labetalol, levothyroxine, losartan, simvastatin and spironolactone.  He reported no history of head injury, loss of consciousness, stroke-like symptoms, seizures, exposure to toxic chemicals or substance abuse.  He reported a past history of transient global amnesia and transient ischemic attacks though could not provide specific details.  His family medical history was notable for his mother having developed dementia in her 10.   He reported no history of mental health contacts. She denied history of mood instability, mania, suicidal behavior or psychotic symptoms.  Observations He arrived thirty minutes late for his first appointment. He apologized and explained that he misread his notes. He arrived on time for his second appointment. He appeared as an appropriately dressed man sporting what looked like a few days growth of beard. He did not show signs of physical distress, unusual mannerisms or motor activity. He had difficulty hearing. His attitude was congenial. His affect appeared as mostly bright (smiling) and congruent with thoughts. He did briefly sob when he spoke of the death of his wife last year. He was talkative and difficult to interrupt with frequent instances of veering off topic in a circumstantial manner. He tended to repeat himself at times. No problems were evident for speech articulation, word finding, word selection, message coherence or language comprehension. His thought processes were coherent and organized without loose associations, verbal perseverations or flight of ideas. There were no indications of loss of reality contact,  thought disorder or confusion.  Evaluation Procedures In addition to review of medical records and interview with Mr. Ledwell and his daughter, the following tests or questionnaires were administered:  Animal Naming Test Beck Depression Inventory-II Boston Naming Test Controlled Oral Word Association Test Patient Health Questionnaire Rey Complex Figure: Copy Trail Making A & B Wechsler Adult Intelligence Scale-IV:  Music therapist, Coding, Digit Span, Matrix Reasoning & Similarities  Wechsler Memory Scale- IV Older Adult Battery Wide Range Achievement Test-4  Test Results Validity & Interpretative Considerations It was concluded that the test results represented a valid measure of his cognitive functioning. He did not display any signs of inattention or psychomotor slowing. He did not report or display problems with vision (he wore his eyeglasses), hearing (he wore his hearing aids) or motor skills. He was able to comprehend task instructions without difficulty. He appeared to expend maximal effort.   His intellectual potential was estimated to fall within the Average range based on demographic factors coupled with a measure of word reading (Wide Range Achievement Test-4) that represents an over-learned verbal skill relatively resistant to the effects of neurological disorder or injury.   His test scores were corrected to reflect norms for his age and, whenever possible, his gender and educational level (i.e., 16 years). A listing of his test scores can be found at the end of this report.   Attention & Executive Functioning His speed of processing on brief psychomotor tests of attention that required the transcription of symbols to match  numbers using a key (Wechsler Adult Intelligence Scale-IV (WAIS-IV) Coding) or drawing lines to connect numbers randomly arrayed on a page in numerical sequence (Trails A) fell within the High Average and Average ranges, respectively.    On complex attentional  task that required mental tracking and set shifting between numbers and letters (Trails B), his speed was within the High Average range. Moreover, he did not deviate from the correct sequence.  His attentional capacity to temporarily hold and manipulate information in working memory was at least within the Average range on tests that required repeating digit sequences in reverse or ascending order (WAIS-IV Digit Span) or immediately recognizing series of symbols in left to right order (Wechsler Memory Scale-IV (WMS-IV) Symbol Span).   He performed normally on verbal fluency tests, which require the executive processes of efficiently retrieving information from semantic memory, tracking prior responses and blocking intrusions from other categories. His ability to fluently generate words to letter prompts (Controlled Oral Word Association Test) was within the Average range. His ability to name members of a category (Animal Naming Test) was within the High Average range.    His abstract reasoning ability was strong as his abilities to verbally classify ostensibly different objects or ideas to a shared category (WAIS-IV Similarities) or match designs or symbols in an abstract manner (WAIS-IV Matrix Reasoning), fell within the Superior range.    Learning & Memory On the WMS-IV Older Adult Battery, his Immediate Memory Index (IMI), a measure of his ability to recall verbal and visual information immediately after the stimuli was presented, fell at the midpoint of the Average range at the 50th percentile. His immediate memory subtest scores were uniformly within the Average range. His Delayed Memory Index (DMI), a combined measure of his ability to recall verbal and visual information after a 20 to 30 minute delay, fell within the Average range at the 61st percentile. His DMI was not discrepant from his IMI, which indicated that his delayed recall was as expected given his level of initial encoding. There was no  difference between his memory for verbal versus visual information.  Language There were no qualitative signs of language dysfunction. His naming to confrontation [facility] [employer]), phonemic fluency (Controlled Oral Word Association Test) and word reading skill (Wide Range Achievement Test-4) were all within the Average range.  Visual Perceptual & Visuospatial Construction There were no signs of spatial inattention or problems with visual recognition. His ability to organize visuospatial material was within normal expectations on tasks that required assembly of two-dimensional block designs from models Product manager) or copy of a spatially-complex geometric design (Rey Complex Figure).  Emotional Status He refused to complete depression symptom questionnaires (Beck Depression Inventory-II; Patient Health Questionnaire) as he stated that the questions did not adequately capture his experiences.   Summary & Conclusions Kenneth Williams is a 79 year-old man with chief complaints of an approximate four year history of gradually worsening memory coupled with listlessness, lack of initiative and low motivation.  There were no indications of cognitive dysfunction. He performed within normal expectations and commensurate with his estimated cognitive potential on the neuropsychological test battery. All of his test scores were within the Average range or better.  From a psychological perspective, he reported an approximate four year history of depressive symptoms that intensified after the death of his wife last year. Specifically, he cited lethargy, low motivation, markedly diminished interest or pleasure in most all activities, periodic crying (especially when he thinks about his wife), social withdrawal,  loss of appetite and increased desire to sleep. He did not report mood instability, suicidal ideation, problems with impulse control or psychotic symptoms.   In conclusion, it is probable that  his everyday memory difficulties are due to the effects of depression. In addition, his hearing loss might be dampening his ability to register new information.  Despite his outward congenial manner, his response to feedback revealed an underlying argumentative and resistant attitude. Rather than seeming pleased that he did well on cognitive testing, he claimed that the test results were invalid because he used "schemes" to artificially improve his performance. When depression was posited as a likely cause of his memory problems, he equivocated on whether he is depressed and changed the topic to discuss possible neurological causes (e.g., his report of past episodes of transient global amnesia and TIAs) for his memory difficulties. He had earlier stated that he could not complete depression symptom questionnaires as the questions did not adequately capture his experiences. When the use of antidepressant medication was discussed, he stated that these medications have never helped him, that he had not been consistently taking them as prescribed over the past few years and that he had had discontinued use of escitalopram about three weeks ago. He was noncommittal about the idea of psychotherapy as a treatment for depression and expressed cynicism.  He stated no interest in attending a grief support group.  Diagnostic Impression Major Depressive Disorder, single episode, moderate [F32.1]  Recommendation Mr. Mccalla was encouraged to seek further treatment for his depression. He was advised to discuss with his primary care physician and neurologist treatment options, including re-starting antidepressant medication and perhaps trying individual psychological counseling. Referral to a psychiatrist could be considered.      I have appreciated the opportunity to evaluate Mr. Schaer. Please feel free to contact me with any comments or questions.    __________________ Antionette Poles, Ph.D Licensed  Psychologist     ADDENDUM-NEUROPSYCHOLOGICAL TEST RESULTS  Animal Naming Test Score= 46 76th (adjusted for age, gender and educational level)   Boston Naming Test Score= [phone] 58th (adjusted for age, gender and educational level)   Controlled Oral Word Association Test Score=  23 words/0 repetitions 31st  (adjusted for age, gender and educational level)   Rey Complex Figure: copy       Score= 34/36  Normal   Trails A Score= 34s 0e 46th   (adjusted for age, gender and educational level)  Trails B Score= 69s 0e 79th  (adjusted for age, gender and educational level)   Wechsler Adult Intelligence Scale-IV   Subtest Scaled Score Percentile  Block Design 12 75th   Similarities 14 91st    Digit Span  Forward               Backward               Sequencing 12  9 14 11  75th       37th   91st  63rd   Matrix Reasoning 14 91st   Coding   13 84th    Wechsler Memory Scale-IV Older Adult Battery  Index Index Score Percentile  Immediate Memory 100 50th  Auditory Memory 102 55th   Visual Memory 100 50th   Delayed Memory 104 61st    Symbol Span Scaled score= 12 84th    Wide Range Achievement Test-4 Subtest  Raw score Standard score Percentile  Word Reading 58/70 97 42nd

## 2014-06-19 NOTE — Progress Notes (Signed)
[facility]  [address]   Telephone [phone] [address] Fax [phone] [place], [place] [address]  Initial Contact Note  Name:  KAUSHAL DIRICO Date of Birth: November 30, 1935 MRN:  [id number] Date:  06/19/2014  FINNICK PERRILLOUX is an 79 y.o. male who was referred for neuropsychological evaluation by [name], MD due to a three to four year history of memory problems.    A total of 5 hours was spent today reviewing medical records, interviewing (CPT Nixon and administering and scoring neurocognitive tests (CPT [phone] & [phone]).  Preliminary Diagnostic Impression: Memory Loss [R41.3] Depression [F32.9]  There were no concerns expressed or behaviors displayed by [name] that would require immediate attention.   A full report will follow once the planned testing has been completed. His next appointment is scheduled for 06/19/14.   Jamey Ripa, Ph.D Licensed Psychologist 06/19/2014

## 2014-06-24 ENCOUNTER — Ambulatory Visit (INDEPENDENT_AMBULATORY_CARE_PROVIDER_SITE_OTHER): Payer: Medicare Other | Admitting: Internal Medicine

## 2014-06-24 ENCOUNTER — Encounter: Payer: Self-pay | Admitting: Internal Medicine

## 2014-06-24 VITALS — BP 160/90 | HR 59 | Wt 199.0 lb

## 2014-06-24 DIAGNOSIS — M542 Cervicalgia: Secondary | ICD-10-CM | POA: Insufficient documentation

## 2014-06-24 NOTE — Progress Notes (Signed)
Pre visit review using our clinic review tool, if applicable. No additional management support is needed unless otherwise documented below in the visit note. 

## 2014-06-24 NOTE — Progress Notes (Signed)
Subjective:    Neck Pain  This is a new problem. The current episode started in the past 7 days. The pain is associated with nothing. The pain is present in the right side (near Adam's apple). The quality of the pain is described as aching (seconds long). The pain is mild. Stiffness is present in the morning. Pertinent negatives include no chest pain, fever, numbness, tingling, trouble swallowing or weakness. He has tried nothing for the symptoms.  Hypertension Associated symptoms include neck pain. Pertinent negatives include no chest pain or palpitations.   F/u on diarrhea - resolved   F/u on edema B legs - better, not well The patient is here to follow up on chronic memory loss - worse, moderate BPH symptoms - worse, HTN, controlled partially with medicines, diet and exercise.   Wt Readings from Last 3 Encounters:  06/24/14 199 lb (90.266 kg)  04/24/14 199 lb 4 oz (90.379 kg)  03/28/14 202 lb (91.627 kg)   BP Readings from Last 3 Encounters:  06/24/14 160/90  05/07/14 162/69  04/24/14 170/90     Review of Systems  Constitutional: Negative for fever, appetite change, fatigue and unexpected weight change.  HENT: Positive for hearing loss. Negative for congestion, ear discharge, ear pain, nosebleeds, sneezing, sore throat and trouble swallowing.   Eyes: Negative for itching and visual disturbance.  Respiratory: Negative for cough.   Cardiovascular: Negative for chest pain, palpitations and leg swelling.  Gastrointestinal: Negative for nausea, diarrhea, blood in stool and abdominal distention.  Genitourinary: Positive for decreased urine volume. Negative for frequency and hematuria.  Musculoskeletal: Positive for neck pain. Negative for back pain, joint swelling, gait problem and neck stiffness.  Skin: Negative for rash.  Neurological: Negative for dizziness, tingling, tremors, speech difficulty, weakness and numbness.  Psychiatric/Behavioral: Negative for suicidal ideas,  sleep disturbance, dysphoric mood and agitation. The patient is not nervous/anxious.        Objective:   Physical Exam  Constitutional: He is oriented to person, place, and time. He appears well-developed. No distress.  NAD  HENT:  Head: Atraumatic.  Nose: Nose normal.  Mouth/Throat: Oropharynx is clear and moist.  B hearing aids  Eyes: Conjunctivae are normal. Pupils are equal, round, and reactive to light. Right eye exhibits no discharge.  Neck: Normal range of motion. No JVD present. No tracheal deviation present. No thyromegaly present.  No mass  Cardiovascular: Normal rate, regular rhythm, normal heart sounds and intact distal pulses.  Exam reveals no gallop and no friction rub.   No murmur heard. Pulmonary/Chest: Effort normal and breath sounds normal. No stridor. No respiratory distress. He has no wheezes. He has no rales. He exhibits no tenderness.  Abdominal: Soft. Bowel sounds are normal. He exhibits no distension and no mass. There is no tenderness. There is no rebound and no guarding.  Musculoskeletal: Normal range of motion. He exhibits no edema or tenderness.  Lymphadenopathy:    He has no cervical adenopathy.  Neurological: He is alert and oriented to person, place, and time. He has normal reflexes. No cranial nerve deficit. He exhibits normal muscle tone. He displays a negative Romberg sign. Coordination and gait normal.  No meningeal signs  Skin: Skin is warm and dry. No rash noted.  Psychiatric: He has a normal mood and affect. His behavior is normal. Judgment and thought content normal.  HR is ok Moles on back AKs B ingrown toenails, thick nail B big toes + others  Lab Results  Component Value Date  WBC 10.0 03/25/2014   HGB 14.9 03/25/2014   HCT 44.3 03/25/2014   PLT 281.0 03/25/2014   GLUCOSE 84 03/25/2014   CHOL 193 03/25/2014   TRIG 314.0* 03/25/2014   HDL 32.80* 03/25/2014   LDLDIRECT 123.0 03/25/2014   LDLCALC 144* 09/12/2013   ALT 24 03/25/2014    AST 16 03/25/2014   NA 135 03/25/2014   K 4.8 03/25/2014   CL 105 03/25/2014   CREATININE 1.45 03/25/2014   BUN 25* 03/25/2014   CO2 24 03/25/2014   TSH 2.44 03/25/2014   PSA 2.15 03/25/2014   INR 1.0 ratio 02/25/2009         Assessment & Plan:

## 2014-06-24 NOTE — Assessment & Plan Note (Signed)
5/16 anterior No pain now

## 2014-06-25 ENCOUNTER — Telehealth: Payer: Self-pay | Admitting: Neurology

## 2014-06-25 NOTE — Telephone Encounter (Signed)
I called the patient. I discussed the results of the formal neuropsychological evaluation. The patient does not appear to have a true dementia, he has poorly treated depression. He will stop the Exelon at this point. He may require a psychiatric referral if standard medications are not effective.   Neuropsychological testing results 06/19/2014:  Emotional Status He refused to complete depression symptom questionnaires (Beck Depression Inventory-II; Patient Health Questionnaire) as he stated that the questions did not adequately capture his experiences.   Summary & Conclusions Kenneth Williams is a 79 year-old man with chief complaints of an approximate four year history of gradually worsening memory coupled with listlessness, lack of initiative and low motivation.  There were no indications of cognitive dysfunction. He performed within normal expectations and commensurate with his estimated cognitive potential on the neuropsychological test battery. All of his test scores were within the Average range or better.  From a psychological perspective, he reported an approximate four year history of depressive symptoms that intensified after the death of his wife last year. Specifically, he cited lethargy, low motivation, markedly diminished interest or pleasure in most all activities, periodic crying (especially when he thinks about his wife), social withdrawal, loss of appetite and increased desire to sleep. He did not report mood instability, suicidal ideation, problems with impulse control or psychotic symptoms.   In conclusion, it is probable that his everyday memory difficulties are due to the effects of depression. In addition, his hearing loss might be dampening his ability to register new information.  Despite his outward congenial manner, his response to feedback revealed an underlying argumentative and resistant attitude. Rather than seeming pleased that he did well on cognitive testing, he  claimed that the test results were invalid because he used "schemes" to artificially improve his performance. When depression was posited as a likely cause of his memory problems, he equivocated on whether he is depressed and changed the topic to discuss possible neurological causes (e.g., his report of past episodes of transient global amnesia and TIAs) for his memory difficulties. He had earlier stated that he could not complete depression symptom questionnaires as the questions did not adequately capture his experiences. When the use of antidepressant medication was discussed, he stated that these medications have never helped him, that he had not been consistently taking them as prescribed over the past few years and that he had had discontinued use of escitalopram about three weeks ago. He was noncommittal about the idea of psychotherapy as a treatment for depression and expressed cynicism. He stated no interest in attending a grief support group.  Diagnostic Impression Major Depressive Disorder, single episode, moderate [F32.1]  Recommendation Kenneth Williams was encouraged to seek further treatment for his depression. He was advised to discuss with his primary care physician and neurologist treatment options, including re-starting antidepressant medication and perhaps trying individual psychological counseling. Referral to a psychiatrist could be considered.

## 2014-07-19 ENCOUNTER — Encounter: Payer: Self-pay | Admitting: Internal Medicine

## 2014-07-19 ENCOUNTER — Ambulatory Visit (INDEPENDENT_AMBULATORY_CARE_PROVIDER_SITE_OTHER): Payer: Medicare Other | Admitting: Internal Medicine

## 2014-07-19 VITALS — BP 132/82 | HR 95 | Temp 98.3°F | Ht 73.0 in | Wt 201.5 lb

## 2014-07-19 DIAGNOSIS — I10 Essential (primary) hypertension: Secondary | ICD-10-CM | POA: Diagnosis not present

## 2014-07-19 DIAGNOSIS — G459 Transient cerebral ischemic attack, unspecified: Secondary | ICD-10-CM

## 2014-07-19 DIAGNOSIS — R453 Demoralization and apathy: Secondary | ICD-10-CM | POA: Diagnosis not present

## 2014-07-19 DIAGNOSIS — R413 Other amnesia: Secondary | ICD-10-CM | POA: Diagnosis not present

## 2014-07-19 DIAGNOSIS — F32A Depression, unspecified: Secondary | ICD-10-CM | POA: Insufficient documentation

## 2014-07-19 DIAGNOSIS — F329 Major depressive disorder, single episode, unspecified: Secondary | ICD-10-CM

## 2014-07-19 NOTE — Assessment & Plan Note (Addendum)
2016 per Dr Jannifer Franklin Pt refused new meds, Psychiatry/psychol consult He agrred to try Lexapro again

## 2014-07-19 NOTE — Assessment & Plan Note (Signed)
2016 Pt stopped Exelon on - recomended per Dr Jannifer Franklin' . Memory loss was attributed more to depression.

## 2014-07-19 NOTE — Progress Notes (Signed)
Pre visit review using our clinic review tool, if applicable. No additional management support is needed unless otherwise documented below in the visit note. 

## 2014-07-19 NOTE — Assessment & Plan Note (Signed)
Dr Jannifer Franklin On Aspirin

## 2014-07-19 NOTE — Progress Notes (Signed)
Subjective:    HPI       F/u on edema B legs - better, not well The patient is here to follow up on chronic memory loss - not worse, moderate BPH symptoms - worse, HTN, controlled partially with medicines, diet and exercise. Pt stopped Exelon on - recomended per Dr Jannifer Franklin' . Memory loss was attributed more to depression. [name] has not been taking Lexapro due to ?reason...   Wt Readings from Last 3 Encounters:  07/19/14 201 lb 8 oz (91.4 kg)  06/24/14 199 lb (90.266 kg)  04/24/14 199 lb 4 oz (90.379 kg)   BP Readings from Last 3 Encounters:  07/19/14 132/82  06/24/14 160/90  05/07/14 162/69     Review of Systems  Constitutional: Negative for appetite change, fatigue and unexpected weight change.  HENT: Positive for hearing loss. Negative for congestion, ear discharge, ear pain, nosebleeds, sneezing and sore throat.   Eyes: Negative for itching and visual disturbance.  Respiratory: Negative for cough.   Cardiovascular: Negative for leg swelling.  Gastrointestinal: Negative for nausea, diarrhea, blood in stool and abdominal distention.  Genitourinary: Positive for decreased urine volume. Negative for frequency and hematuria.  Musculoskeletal: Negative for back pain, joint swelling, gait problem and neck stiffness.  Skin: Negative for rash.  Neurological: Negative for dizziness, tremors and speech difficulty.  Psychiatric/Behavioral: Negative for suicidal ideas, sleep disturbance, dysphoric mood and agitation. The patient is not nervous/anxious.        Objective:   Physical Exam  Constitutional: He is oriented to person, place, and time. He appears well-developed. No distress.  NAD  HENT:  Head: Atraumatic.  Nose: Nose normal.  Mouth/Throat: Oropharynx is clear and moist.  B hearing aids  Eyes: Conjunctivae are normal. Pupils are equal, round, and reactive to light. Right eye exhibits no discharge.  Neck: Normal range of motion. No JVD present. No tracheal  deviation present. No thyromegaly present.  No mass  Cardiovascular: Normal rate, regular rhythm, normal heart sounds and intact distal pulses.  Exam reveals no gallop and no friction rub.   No murmur heard. Pulmonary/Chest: Effort normal and breath sounds normal. No stridor. No respiratory distress. He has no wheezes. He has no rales. He exhibits no tenderness.  Abdominal: Soft. Bowel sounds are normal. He exhibits no distension and no mass. There is no tenderness. There is no rebound and no guarding.  Musculoskeletal: Normal range of motion. He exhibits no edema or tenderness.  Lymphadenopathy:    He has no cervical adenopathy.  Neurological: He is alert and oriented to person, place, and time. He has normal reflexes. No cranial nerve deficit. He exhibits normal muscle tone. He displays a negative Romberg sign. Coordination and gait normal.  No meningeal signs  Skin: Skin is warm and dry. No rash noted.  Psychiatric: He has a normal mood and affect. His behavior is normal. Judgment and thought content normal.  HR is ok Moles on back AKs B ingrown toenails, thick nail B big toes + others Hard-hearing  Lab Results  Component Value Date   WBC 10.0 03/25/2014   HGB 14.9 03/25/2014   HCT 44.3 03/25/2014   PLT 281.0 03/25/2014   GLUCOSE 84 03/25/2014   CHOL 193 03/25/2014   TRIG 314.0* 03/25/2014   HDL 32.80* 03/25/2014   LDLDIRECT 123.0 03/25/2014   LDLCALC 144* 09/12/2013   ALT 24 03/25/2014   AST 16 03/25/2014   NA 135 03/25/2014   K 4.8 03/25/2014   CL 105 03/25/2014  CREATININE 1.45 03/25/2014   BUN 25* 03/25/2014   CO2 24 03/25/2014   TSH 2.44 03/25/2014   PSA 2.15 03/25/2014   INR 1.0 ratio 02/25/2009         Assessment & Plan:

## 2014-07-19 NOTE — Assessment & Plan Note (Signed)
Chronic Multiple Rx intolerances On Losartan, Atenolol

## 2014-09-09 ENCOUNTER — Other Ambulatory Visit: Payer: Self-pay | Admitting: Internal Medicine

## 2014-10-18 ENCOUNTER — Encounter: Payer: Self-pay | Admitting: Internal Medicine

## 2014-10-18 ENCOUNTER — Ambulatory Visit (INDEPENDENT_AMBULATORY_CARE_PROVIDER_SITE_OTHER): Payer: Medicare Other | Admitting: Internal Medicine

## 2014-10-18 VITALS — BP 130/70 | HR 58 | Wt 199.0 lb

## 2014-10-18 DIAGNOSIS — Z23 Encounter for immunization: Secondary | ICD-10-CM

## 2014-10-18 DIAGNOSIS — I10 Essential (primary) hypertension: Secondary | ICD-10-CM

## 2014-10-18 DIAGNOSIS — R634 Abnormal weight loss: Secondary | ICD-10-CM

## 2014-10-18 DIAGNOSIS — F329 Major depressive disorder, single episode, unspecified: Secondary | ICD-10-CM

## 2014-10-18 DIAGNOSIS — G459 Transient cerebral ischemic attack, unspecified: Secondary | ICD-10-CM | POA: Diagnosis not present

## 2014-10-18 DIAGNOSIS — E038 Other specified hypothyroidism: Secondary | ICD-10-CM

## 2014-10-18 DIAGNOSIS — F32A Depression, unspecified: Secondary | ICD-10-CM

## 2014-10-18 DIAGNOSIS — R453 Demoralization and apathy: Secondary | ICD-10-CM | POA: Diagnosis not present

## 2014-10-18 DIAGNOSIS — R21 Rash and other nonspecific skin eruption: Secondary | ICD-10-CM

## 2014-10-18 DIAGNOSIS — E034 Atrophy of thyroid (acquired): Secondary | ICD-10-CM

## 2014-10-18 MED ORDER — CLOTRIMAZOLE-BETAMETHASONE 1-0.05 % EX CREA: 1.0000 "application " | TOPICAL_CREAM | Freq: Two times a day (BID) | CUTANEOUS | Status: DC

## 2014-10-18 NOTE — Assessment & Plan Note (Signed)
No relapse On ASA 

## 2014-10-18 NOTE — Assessment & Plan Note (Signed)
On Levothroid 

## 2014-10-18 NOTE — Assessment & Plan Note (Signed)
Lotrisone 

## 2014-10-18 NOTE — Assessment & Plan Note (Signed)
Try Ensure [employer]

## 2014-10-18 NOTE — Progress Notes (Signed)
Subjective:  Patient ID: Kenneth Williams, male    DOB: 05/12/1935  Age: 79 y.o. MRN: [id number]  CC: No chief complaint on file.   HPI Kenneth Williams presents for HTN, TIA, memory loss, depression f/u. Not taking Lexapro. F/u apathy. C/o muscle bulk loss  Outpatient Prescriptions Prior to Visit  Medication Sig Dispense Refill  . cholecalciferol (VITAMIN D) 1000 UNITS tablet Take 2,000 Units by mouth daily.      . colchicine 0.6 MG tablet Take 1 tablet (0.6 mg total) by mouth daily as needed. 90 tablet 3  . Cyanocobalamin (VITAMIN B 12 PO) Take 1 tablet by mouth daily.      Marland Kitchen labetalol (NORMODYNE) 200 MG tablet TAKE 2 TABLETS (400 MG TOTAL) BY MOUTH 2 (TWO) TIMES DAILY. 120 tablet 11  . levothyroxine (SYNTHROID, LEVOTHROID) 50 MCG tablet TAKE 1 TABLET EVERY DAY BEFORE BREAKFAST 90 tablet 5  . losartan (COZAAR) 100 MG tablet Take 1 tablet (100 mg total) by mouth daily. 90 tablet 3  . simvastatin (ZOCOR) 40 MG tablet TAKE 1 TABLET AT BEDTIME 90 tablet 3  . aspirin (BAYER ASPIRIN) 325 MG tablet Take 1 tablet (325 mg total) by mouth daily. 100 tablet 3  . escitalopram (LEXAPRO) 10 MG tablet Take 1 tablet (10 mg total) by mouth daily. (Patient not taking: Reported on 10/18/2014) 30 tablet 3  . spironolactone (ALDACTONE) 25 MG tablet TAKE ONE-HALF TABLET (12.5 MG TOTAL) BY MOUTH DAILY. (Patient not taking: Reported on 10/18/2014) 45 tablet 11   No facility-administered medications prior to visit.    ROS Review of Systems  Constitutional: Negative for appetite change, fatigue and unexpected weight change.  HENT: Negative for congestion, nosebleeds, sneezing, sore throat and trouble swallowing.   Eyes: Negative for itching and visual disturbance.  Respiratory: Negative for cough.   Cardiovascular: Negative for chest pain, palpitations and leg swelling.  Gastrointestinal: Negative for nausea, diarrhea, blood in stool and abdominal distention.  Genitourinary: Negative for frequency and  hematuria.  Musculoskeletal: Negative for back pain, joint swelling, gait problem and neck pain.  Skin: Negative for rash.  Neurological: Negative for dizziness, tremors, speech difficulty and weakness.  Psychiatric/Behavioral: Positive for sleep disturbance and decreased concentration. Negative for dysphoric mood and agitation. The patient is nervous/anxious.     Objective:  BP 130/70 mmHg  Pulse 58  Wt 199 lb (90.266 kg)  SpO2 96%  BP Readings from Last 3 Encounters:  10/18/14 130/70  07/19/14 132/82  06/24/14 160/90    Wt Readings from Last 3 Encounters:  10/18/14 199 lb (90.266 kg)  07/19/14 201 lb 8 oz (91.4 kg)  06/24/14 199 lb (90.266 kg)    Physical Exam  Constitutional: He is oriented to person, place, and time. He appears well-developed. No distress.  NAD  HENT:  Mouth/Throat: Oropharynx is clear and moist.  Eyes: Conjunctivae are normal. Pupils are equal, round, and reactive to light.  Neck: Normal range of motion. No JVD present. No thyromegaly present.  Cardiovascular: Normal rate, regular rhythm, normal heart sounds and intact distal pulses.  Exam reveals no gallop and no friction rub.   No murmur heard. Pulmonary/Chest: Effort normal and breath sounds normal. No respiratory distress. He has no wheezes. He has no rales. He exhibits no tenderness.  Abdominal: Soft. Bowel sounds are normal. He exhibits no distension and no mass. There is no tenderness. There is no rebound and no guarding.  Musculoskeletal: Normal range of motion. He exhibits no edema or tenderness.  Lymphadenopathy:  He has no cervical adenopathy.  Neurological: He is alert and oriented to person, place, and time. He has normal reflexes. No cranial nerve deficit. He exhibits normal muscle tone. He displays a negative Romberg sign. Coordination and gait normal.  Skin: Skin is warm and dry. No rash noted.  Psychiatric: He has a normal mood and affect. His behavior is normal. Judgment and thought  content normal.  rash on scrotum under penis  Lab Results  Component Value Date   WBC 10.0 03/25/2014   HGB 14.9 03/25/2014   HCT 44.3 03/25/2014   PLT 281.0 03/25/2014   GLUCOSE 84 03/25/2014   CHOL 193 03/25/2014   TRIG 314.0* 03/25/2014   HDL 32.80* 03/25/2014   LDLDIRECT 123.0 03/25/2014   LDLCALC 144* 09/12/2013   ALT 24 03/25/2014   AST 16 03/25/2014   NA 135 03/25/2014   K 4.8 03/25/2014   CL 105 03/25/2014   CREATININE 1.45 03/25/2014   BUN 25* 03/25/2014   CO2 24 03/25/2014   TSH 2.44 03/25/2014   PSA 2.15 03/25/2014   INR 1.0 ratio 02/25/2009    No results found.  Assessment & Plan:   There are no diagnoses linked to this encounter. I am having Kenneth Williams maintain his Cyanocobalamin (VITAMIN B 12 PO), cholecalciferol, colchicine, simvastatin, levothyroxine, losartan, escitalopram, labetalol, spironolactone, and aspirin.  Meds ordered this encounter  Medications  . aspirin 81 MG tablet    Sig: Take 162 mg by mouth daily.     Follow-up: No Follow-up on file.  Walker Kehr, MD

## 2014-10-18 NOTE — Assessment & Plan Note (Signed)
Multiple Rx intolerances On Losartan, Atenolol

## 2014-10-18 NOTE — Progress Notes (Signed)
Pre visit review using our clinic review tool, if applicable. No additional management support is needed unless otherwise documented below in the visit note. 

## 2014-10-18 NOTE — Assessment & Plan Note (Signed)
Not taking Lexapro. Try to re-start. No worsening.Marland KitchenMarland Kitchen

## 2014-10-18 NOTE — Assessment & Plan Note (Signed)
Re-start Lexapro 

## 2014-10-30 DIAGNOSIS — H25813 Combined forms of age-related cataract, bilateral: Secondary | ICD-10-CM | POA: Diagnosis not present

## 2014-10-30 DIAGNOSIS — Z7982 Long term (current) use of aspirin: Secondary | ICD-10-CM | POA: Diagnosis not present

## 2014-10-30 DIAGNOSIS — Z79899 Other long term (current) drug therapy: Secondary | ICD-10-CM | POA: Diagnosis not present

## 2014-10-30 DIAGNOSIS — I10 Essential (primary) hypertension: Secondary | ICD-10-CM | POA: Diagnosis not present

## 2014-10-30 DIAGNOSIS — Z01818 Encounter for other preprocedural examination: Secondary | ICD-10-CM | POA: Diagnosis not present

## 2014-11-07 DIAGNOSIS — F329 Major depressive disorder, single episode, unspecified: Secondary | ICD-10-CM | POA: Diagnosis not present

## 2014-11-07 DIAGNOSIS — M109 Gout, unspecified: Secondary | ICD-10-CM | POA: Diagnosis not present

## 2014-11-07 DIAGNOSIS — I493 Ventricular premature depolarization: Secondary | ICD-10-CM | POA: Diagnosis not present

## 2014-11-07 DIAGNOSIS — I6529 Occlusion and stenosis of unspecified carotid artery: Secondary | ICD-10-CM | POA: Diagnosis not present

## 2014-11-07 DIAGNOSIS — Z8673 Personal history of transient ischemic attack (TIA), and cerebral infarction without residual deficits: Secondary | ICD-10-CM | POA: Diagnosis not present

## 2014-11-07 DIAGNOSIS — H2512 Age-related nuclear cataract, left eye: Secondary | ICD-10-CM | POA: Diagnosis not present

## 2014-11-07 DIAGNOSIS — Z7902 Long term (current) use of antithrombotics/antiplatelets: Secondary | ICD-10-CM | POA: Diagnosis not present

## 2014-11-07 DIAGNOSIS — K904 Malabsorption due to intolerance, not elsewhere classified: Secondary | ICD-10-CM | POA: Diagnosis not present

## 2014-11-07 DIAGNOSIS — E079 Disorder of thyroid, unspecified: Secondary | ICD-10-CM | POA: Diagnosis not present

## 2014-11-07 DIAGNOSIS — Z79899 Other long term (current) drug therapy: Secondary | ICD-10-CM | POA: Diagnosis not present

## 2014-11-07 DIAGNOSIS — I2510 Atherosclerotic heart disease of native coronary artery without angina pectoris: Secondary | ICD-10-CM | POA: Diagnosis not present

## 2014-11-07 DIAGNOSIS — I10 Essential (primary) hypertension: Secondary | ICD-10-CM | POA: Diagnosis not present

## 2014-11-07 DIAGNOSIS — H25812 Combined forms of age-related cataract, left eye: Secondary | ICD-10-CM | POA: Diagnosis not present

## 2014-11-08 DIAGNOSIS — Z48810 Encounter for surgical aftercare following surgery on the sense organs: Secondary | ICD-10-CM | POA: Diagnosis not present

## 2014-11-08 DIAGNOSIS — Z9842 Cataract extraction status, left eye: Secondary | ICD-10-CM | POA: Diagnosis not present

## 2014-11-08 DIAGNOSIS — H02833 Dermatochalasis of right eye, unspecified eyelid: Secondary | ICD-10-CM | POA: Diagnosis not present

## 2014-11-08 DIAGNOSIS — I10 Essential (primary) hypertension: Secondary | ICD-10-CM | POA: Diagnosis not present

## 2014-11-08 DIAGNOSIS — H269 Unspecified cataract: Secondary | ICD-10-CM | POA: Diagnosis not present

## 2014-11-08 DIAGNOSIS — Z961 Presence of intraocular lens: Secondary | ICD-10-CM | POA: Diagnosis not present

## 2014-11-08 DIAGNOSIS — E079 Disorder of thyroid, unspecified: Secondary | ICD-10-CM | POA: Diagnosis not present

## 2014-11-08 DIAGNOSIS — H02836 Dermatochalasis of left eye, unspecified eyelid: Secondary | ICD-10-CM | POA: Diagnosis not present

## 2014-11-08 DIAGNOSIS — Z7982 Long term (current) use of aspirin: Secondary | ICD-10-CM | POA: Diagnosis not present

## 2014-11-21 DIAGNOSIS — K859 Acute pancreatitis, unspecified: Secondary | ICD-10-CM | POA: Diagnosis not present

## 2014-11-21 DIAGNOSIS — Z9049 Acquired absence of other specified parts of digestive tract: Secondary | ICD-10-CM | POA: Diagnosis not present

## 2014-11-21 DIAGNOSIS — H25811 Combined forms of age-related cataract, right eye: Secondary | ICD-10-CM | POA: Diagnosis not present

## 2014-11-21 DIAGNOSIS — H25813 Combined forms of age-related cataract, bilateral: Secondary | ICD-10-CM | POA: Diagnosis not present

## 2014-11-21 DIAGNOSIS — R972 Elevated prostate specific antigen [PSA]: Secondary | ICD-10-CM | POA: Diagnosis not present

## 2014-11-21 DIAGNOSIS — I6523 Occlusion and stenosis of bilateral carotid arteries: Secondary | ICD-10-CM | POA: Diagnosis not present

## 2014-11-21 DIAGNOSIS — Z888 Allergy status to other drugs, medicaments and biological substances status: Secondary | ICD-10-CM | POA: Diagnosis not present

## 2014-11-21 DIAGNOSIS — I10 Essential (primary) hypertension: Secondary | ICD-10-CM | POA: Diagnosis not present

## 2014-11-21 DIAGNOSIS — F329 Major depressive disorder, single episode, unspecified: Secondary | ICD-10-CM | POA: Diagnosis not present

## 2014-11-21 DIAGNOSIS — E079 Disorder of thyroid, unspecified: Secondary | ICD-10-CM | POA: Diagnosis not present

## 2014-11-21 DIAGNOSIS — G3184 Mild cognitive impairment, so stated: Secondary | ICD-10-CM | POA: Diagnosis not present

## 2014-11-21 DIAGNOSIS — Z8673 Personal history of transient ischemic attack (TIA), and cerebral infarction without residual deficits: Secondary | ICD-10-CM | POA: Diagnosis not present

## 2014-11-21 DIAGNOSIS — I2510 Atherosclerotic heart disease of native coronary artery without angina pectoris: Secondary | ICD-10-CM | POA: Diagnosis not present

## 2014-11-21 DIAGNOSIS — Z7982 Long term (current) use of aspirin: Secondary | ICD-10-CM | POA: Diagnosis not present

## 2014-11-21 DIAGNOSIS — Z79899 Other long term (current) drug therapy: Secondary | ICD-10-CM | POA: Diagnosis not present

## 2014-11-21 DIAGNOSIS — M109 Gout, unspecified: Secondary | ICD-10-CM | POA: Diagnosis not present

## 2014-11-22 DIAGNOSIS — Z79899 Other long term (current) drug therapy: Secondary | ICD-10-CM | POA: Diagnosis not present

## 2014-11-22 DIAGNOSIS — H02834 Dermatochalasis of left upper eyelid: Secondary | ICD-10-CM | POA: Diagnosis not present

## 2014-11-22 DIAGNOSIS — H02831 Dermatochalasis of right upper eyelid: Secondary | ICD-10-CM | POA: Diagnosis not present

## 2014-11-22 DIAGNOSIS — Z9841 Cataract extraction status, right eye: Secondary | ICD-10-CM | POA: Diagnosis not present

## 2014-11-22 DIAGNOSIS — Z9842 Cataract extraction status, left eye: Secondary | ICD-10-CM | POA: Diagnosis not present

## 2014-11-22 DIAGNOSIS — Z961 Presence of intraocular lens: Secondary | ICD-10-CM | POA: Diagnosis not present

## 2014-11-22 DIAGNOSIS — Z7982 Long term (current) use of aspirin: Secondary | ICD-10-CM | POA: Diagnosis not present

## 2014-11-22 DIAGNOSIS — Z48810 Encounter for surgical aftercare following surgery on the sense organs: Secondary | ICD-10-CM | POA: Diagnosis not present

## 2015-01-22 ENCOUNTER — Ambulatory Visit: Payer: Medicare Other | Admitting: Internal Medicine

## 2015-01-27 ENCOUNTER — Encounter: Payer: Self-pay | Admitting: Internal Medicine

## 2015-01-27 ENCOUNTER — Ambulatory Visit (INDEPENDENT_AMBULATORY_CARE_PROVIDER_SITE_OTHER): Payer: Medicare Other | Admitting: Internal Medicine

## 2015-01-27 VITALS — BP 130/85 | HR 54 | Wt 198.0 lb

## 2015-01-27 DIAGNOSIS — G459 Transient cerebral ischemic attack, unspecified: Secondary | ICD-10-CM | POA: Diagnosis not present

## 2015-01-27 DIAGNOSIS — E034 Atrophy of thyroid (acquired): Secondary | ICD-10-CM

## 2015-01-27 DIAGNOSIS — R0789 Other chest pain: Secondary | ICD-10-CM | POA: Insufficient documentation

## 2015-01-27 DIAGNOSIS — I10 Essential (primary) hypertension: Secondary | ICD-10-CM | POA: Diagnosis not present

## 2015-01-27 DIAGNOSIS — R413 Other amnesia: Secondary | ICD-10-CM

## 2015-01-27 DIAGNOSIS — E038 Other specified hypothyroidism: Secondary | ICD-10-CM

## 2015-01-27 DIAGNOSIS — R079 Chest pain, unspecified: Secondary | ICD-10-CM | POA: Diagnosis not present

## 2015-01-27 DIAGNOSIS — F32A Depression, unspecified: Secondary | ICD-10-CM

## 2015-01-27 DIAGNOSIS — F329 Major depressive disorder, single episode, unspecified: Secondary | ICD-10-CM

## 2015-01-27 MED ORDER — BUPROPION HCL ER (XL) 150 MG PO TB24: 150.0000 mg | ORAL_TABLET | Freq: Every day | ORAL | Status: DC

## 2015-01-27 MED ORDER — LABETALOL HCL 200 MG PO TABS: 400.0000 mg | ORAL_TABLET | Freq: Two times a day (BID) | ORAL | Status: DC

## 2015-01-27 MED ORDER — LOSARTAN POTASSIUM 100 MG PO TABS: 100.0000 mg | ORAL_TABLET | Freq: Every day | ORAL | Status: DC

## 2015-01-27 MED ORDER — LEVOTHYROXINE SODIUM 50 MCG PO TABS: 50.0000 ug | ORAL_TABLET | Freq: Every day | ORAL | Status: DC

## 2015-01-27 MED ORDER — SIMVASTATIN 40 MG PO TABS: 40.0000 mg | ORAL_TABLET | Freq: Every day | ORAL | Status: DC

## 2015-01-27 NOTE — Progress Notes (Signed)
Pre visit review using our clinic review tool, if applicable. No additional management support is needed unless otherwise documented below in the visit note. 

## 2015-01-27 NOTE — Assessment & Plan Note (Signed)
EKG CL stress test On ASA

## 2015-01-27 NOTE — Assessment & Plan Note (Signed)
12/16 start Wellbutrin

## 2015-01-27 NOTE — Assessment & Plan Note (Signed)
On Levothroid 

## 2015-01-27 NOTE — Progress Notes (Signed)
Subjective:  Patient ID: Kenneth Williams, male    DOB: 11-05-1935  Age: 79 y.o. MRN: [id number]  CC: No chief complaint on file.   HPI Kenneth Williams presents for B12 def, depression, HTN, memory issues f/u. Pt is not taking aldactone and Lexapro for ?reason. BP is ok at home. Pt had some L chest pressure at times off and on at rest  Outpatient Prescriptions Prior to Visit  Medication Sig Dispense Refill  . aspirin 81 MG tablet Take 162 mg by mouth daily.    . cholecalciferol (VITAMIN D) 1000 UNITS tablet Take 2,000 Units by mouth daily.      . clotrimazole-betamethasone (LOTRISONE) cream Apply 1 application topically 2 (two) times daily. 45 g 1  . colchicine 0.6 MG tablet Take 1 tablet (0.6 mg total) by mouth daily as needed. 90 tablet 3  . Cyanocobalamin (VITAMIN B 12 PO) Take 1 tablet by mouth daily.      Marland Kitchen escitalopram (LEXAPRO) 10 MG tablet Take 1 tablet (10 mg total) by mouth daily. 30 tablet 3  . labetalol (NORMODYNE) 200 MG tablet TAKE 2 TABLETS (400 MG TOTAL) BY MOUTH 2 (TWO) TIMES DAILY. 120 tablet 11  . levothyroxine (SYNTHROID, LEVOTHROID) 50 MCG tablet TAKE 1 TABLET EVERY DAY BEFORE BREAKFAST 90 tablet 5  . losartan (COZAAR) 100 MG tablet Take 1 tablet (100 mg total) by mouth daily. 90 tablet 3  . simvastatin (ZOCOR) 40 MG tablet TAKE 1 TABLET AT BEDTIME 90 tablet 3  . spironolactone (ALDACTONE) 25 MG tablet TAKE ONE-HALF TABLET (12.5 MG TOTAL) BY MOUTH DAILY. 45 tablet 11   No facility-administered medications prior to visit.    ROS Review of Systems  Constitutional: Positive for fatigue. Negative for appetite change and unexpected weight change.  HENT: Negative for congestion, nosebleeds, sneezing, sore throat and trouble swallowing.   Eyes: Negative for itching and visual disturbance.  Respiratory: Negative for cough.   Cardiovascular: Negative for chest pain, palpitations and leg swelling.  Gastrointestinal: Negative for nausea, diarrhea, blood in stool and  abdominal distention.  Genitourinary: Negative for frequency and hematuria.  Musculoskeletal: Negative for back pain, joint swelling, gait problem and neck pain.  Skin: Negative for rash.  Neurological: Negative for dizziness, tremors, speech difficulty and weakness.  Psychiatric/Behavioral: Positive for decreased concentration. Negative for suicidal ideas, sleep disturbance, dysphoric mood and agitation. The patient is not nervous/anxious.     Objective:  BP 130/85 mmHg  Pulse 54  Wt 198 lb (89.812 kg)  SpO2 98%  BP Readings from Last 3 Encounters:  01/27/15 130/85  10/18/14 130/70  07/19/14 132/82    Wt Readings from Last 3 Encounters:  01/27/15 198 lb (89.812 kg)  10/18/14 199 lb (90.266 kg)  07/19/14 201 lb 8 oz (91.4 kg)    Physical Exam  Constitutional: He is oriented to person, place, and time. He appears well-developed. No distress.  NAD  HENT:  Mouth/Throat: Oropharynx is clear and moist.  Eyes: Conjunctivae are normal. Pupils are equal, round, and reactive to light.  Neck: Normal range of motion. No JVD present. No thyromegaly present.  Cardiovascular: Normal rate, regular rhythm, normal heart sounds and intact distal pulses.  Exam reveals no gallop and no friction rub.   No murmur heard. Pulmonary/Chest: Effort normal and breath sounds normal. No respiratory distress. He has no wheezes. He has no rales. He exhibits no tenderness.  Abdominal: Soft. Bowel sounds are normal. He exhibits no distension and no mass. There is no tenderness.  There is no rebound and no guarding.  Musculoskeletal: Normal range of motion. He exhibits no edema or tenderness.  Lymphadenopathy:    He has no cervical adenopathy.  Neurological: He is alert and oriented to person, place, and time. He has normal reflexes. No cranial nerve deficit. He exhibits normal muscle tone. He displays a negative Romberg sign. Coordination and gait normal.  Skin: Skin is warm and dry. No rash noted.    Psychiatric: His behavior is normal. Judgment and thought content normal.   EKG S brady  Lab Results  Component Value Date   WBC 10.0 03/25/2014   HGB 14.9 03/25/2014   HCT 44.3 03/25/2014   PLT 281.0 03/25/2014   GLUCOSE 84 03/25/2014   CHOL 193 03/25/2014   TRIG 314.0* 03/25/2014   HDL 32.80* 03/25/2014   LDLDIRECT 123.0 03/25/2014   LDLCALC 144* 09/12/2013   ALT 24 03/25/2014   AST 16 03/25/2014   NA 135 03/25/2014   K 4.8 03/25/2014   CL 105 03/25/2014   CREATININE 1.45 03/25/2014   BUN 25* 03/25/2014   CO2 24 03/25/2014   TSH 2.44 03/25/2014   PSA 2.15 03/25/2014   INR 1.0 ratio 02/25/2009    No results found.  Assessment & Plan:   Diagnoses and all orders for this visit:  Essential hypertension  Transient cerebral ischemia, unspecified transient cerebral ischemia type  Hypothyroidism due to acquired atrophy of thyroid  Memory loss  Depression  Chest pain, unspecified chest pain type -     EKG 12-Lead -     Myocardial Perfusion Imaging; Future  Chest pain, atypical -     Myocardial Perfusion Imaging; Future  Other orders -     labetalol (NORMODYNE) 200 MG tablet; Take 2 tablets (400 mg total) by mouth 2 (two) times daily. -     levothyroxine (SYNTHROID, LEVOTHROID) 50 MCG tablet; Take 1 tablet (50 mcg total) by mouth daily before breakfast. -     losartan (COZAAR) 100 MG tablet; Take 1 tablet (100 mg total) by mouth daily. -     simvastatin (ZOCOR) 40 MG tablet; Take 1 tablet (40 mg total) by mouth at bedtime. -     buPROPion (WELLBUTRIN XL) 150 MG 24 hr tablet; Take 1 tablet (150 mg total) by mouth daily.   I have discontinued Mr. Vincente escitalopram and spironolactone. I have also changed his levothyroxine and simvastatin. Additionally, I am having him start on buPROPion. Lastly, I am having him maintain his Cyanocobalamin (VITAMIN B 12 PO), cholecalciferol, colchicine, aspirin, clotrimazole-betamethasone, labetalol, and losartan.  Meds ordered  this encounter  Medications  . labetalol (NORMODYNE) 200 MG tablet    Sig: Take 2 tablets (400 mg total) by mouth 2 (two) times daily.    Dispense:  360 tablet    Refill:  3  . levothyroxine (SYNTHROID, LEVOTHROID) 50 MCG tablet    Sig: Take 1 tablet (50 mcg total) by mouth daily before breakfast.    Dispense:  90 tablet    Refill:  3  . losartan (COZAAR) 100 MG tablet    Sig: Take 1 tablet (100 mg total) by mouth daily.    Dispense:  90 tablet    Refill:  3  . simvastatin (ZOCOR) 40 MG tablet    Sig: Take 1 tablet (40 mg total) by mouth at bedtime.    Dispense:  90 tablet    Refill:  3  . buPROPion (WELLBUTRIN XL) 150 MG 24 hr tablet    Sig: Take  1 tablet (150 mg total) by mouth daily.    Dispense:  30 tablet    Refill:  5     Follow-up: Return in about 2 months (around 03/30/2015) for a follow-up visit.  Walker Kehr, MD

## 2015-01-27 NOTE — Assessment & Plan Note (Signed)
12/16 start Wellbutrin XL

## 2015-01-27 NOTE — Assessment & Plan Note (Signed)
On ASA BP control: Labetalol, Losartan

## 2015-01-27 NOTE — Assessment & Plan Note (Signed)
Pt is eating fast food a lot - needs NAS good diet

## 2015-02-12 ENCOUNTER — Other Ambulatory Visit: Payer: Self-pay | Admitting: Internal Medicine

## 2015-03-18 ENCOUNTER — Telehealth (HOSPITAL_COMMUNITY): Payer: Self-pay

## 2015-03-18 NOTE — Telephone Encounter (Signed)
Encounter complete. 

## 2015-03-20 ENCOUNTER — Ambulatory Visit (HOSPITAL_COMMUNITY)
Admission: RE | Admit: 2015-03-20 | Discharge: 2015-03-20 | Disposition: A | Discharge status: Home / Self Care | Payer: Medicare Other | Source: Ambulatory Visit | Attending: Internal Medicine | Admitting: Internal Medicine

## 2015-03-20 DIAGNOSIS — Z8249 Family history of ischemic heart disease and other diseases of the circulatory system: Secondary | ICD-10-CM | POA: Diagnosis not present

## 2015-03-20 DIAGNOSIS — Z8673 Personal history of transient ischemic attack (TIA), and cerebral infarction without residual deficits: Secondary | ICD-10-CM | POA: Insufficient documentation

## 2015-03-20 DIAGNOSIS — R5383 Other fatigue: Secondary | ICD-10-CM | POA: Diagnosis not present

## 2015-03-20 DIAGNOSIS — R079 Chest pain, unspecified: Secondary | ICD-10-CM | POA: Diagnosis not present

## 2015-03-20 DIAGNOSIS — R0789 Other chest pain: Secondary | ICD-10-CM | POA: Insufficient documentation

## 2015-03-20 DIAGNOSIS — I739 Peripheral vascular disease, unspecified: Secondary | ICD-10-CM | POA: Diagnosis not present

## 2015-03-20 DIAGNOSIS — R0609 Other forms of dyspnea: Secondary | ICD-10-CM | POA: Insufficient documentation

## 2015-03-20 DIAGNOSIS — I779 Disorder of arteries and arterioles, unspecified: Secondary | ICD-10-CM | POA: Diagnosis not present

## 2015-03-20 DIAGNOSIS — I10 Essential (primary) hypertension: Secondary | ICD-10-CM | POA: Insufficient documentation

## 2015-03-20 LAB — MYOCARDIAL PERFUSION IMAGING
CHL CUP RESTING HR STRESS: 66 {beats}/min
CSEPPHR: 86 {beats}/min
LVDIAVOL: 113 mL
LVSYSVOL: 52 mL
NUC STRESS TID: 0.93
SDS: 1
SRS: 2
SSS: 3

## 2015-03-20 MED ORDER — AMINOPHYLLINE 25 MG/ML IV SOLN
75.0000 mg | Freq: Once | INTRAVENOUS | Status: AC
  Administered 2015-03-20: 75 mg via INTRAVENOUS

## 2015-03-20 MED ORDER — REGADENOSON 0.4 MG/5ML IV SOLN
0.4000 mg | Freq: Once | INTRAVENOUS | Status: AC
  Administered 2015-03-20: 0.4 mg via INTRAVENOUS

## 2015-03-20 MED ORDER — TECHNETIUM TC 99M SESTAMIBI GENERIC - CARDIOLITE
10.6000 | Freq: Once | INTRAVENOUS | Status: AC | PRN
  Administered 2015-03-20: 10.6 via INTRAVENOUS

## 2015-03-20 MED ORDER — TECHNETIUM TC 99M SESTAMIBI GENERIC - CARDIOLITE
30.5000 | Freq: Once | INTRAVENOUS | Status: AC | PRN
Start: 2015-03-20 — End: 2015-03-20
  Administered 2015-03-20: 30.5 via INTRAVENOUS

## 2015-04-04 ENCOUNTER — Encounter: Payer: Self-pay | Admitting: Internal Medicine

## 2015-04-04 ENCOUNTER — Ambulatory Visit (INDEPENDENT_AMBULATORY_CARE_PROVIDER_SITE_OTHER): Payer: Medicare Other | Admitting: Internal Medicine

## 2015-04-04 VITALS — BP 148/82 | HR 63 | Temp 97.4°F | Ht 73.0 in

## 2015-04-04 DIAGNOSIS — K5732 Diverticulitis of large intestine without perforation or abscess without bleeding: Secondary | ICD-10-CM

## 2015-04-04 DIAGNOSIS — R634 Abnormal weight loss: Secondary | ICD-10-CM

## 2015-04-04 DIAGNOSIS — F329 Major depressive disorder, single episode, unspecified: Secondary | ICD-10-CM

## 2015-04-04 DIAGNOSIS — R197 Diarrhea, unspecified: Secondary | ICD-10-CM

## 2015-04-04 DIAGNOSIS — J069 Acute upper respiratory infection, unspecified: Secondary | ICD-10-CM | POA: Diagnosis not present

## 2015-04-04 DIAGNOSIS — R413 Other amnesia: Secondary | ICD-10-CM

## 2015-04-04 DIAGNOSIS — I10 Essential (primary) hypertension: Secondary | ICD-10-CM | POA: Diagnosis not present

## 2015-04-04 DIAGNOSIS — F32A Depression, unspecified: Secondary | ICD-10-CM

## 2015-04-04 MED ORDER — BUPROPION HCL ER (XL) 300 MG PO TB24: 300.0000 mg | ORAL_TABLET | Freq: Every day | ORAL | Status: DC

## 2015-04-04 MED ORDER — CIPROFLOXACIN HCL 500 MG PO TABS: 500.0000 mg | ORAL_TABLET | Freq: Two times a day (BID) | ORAL | Status: DC

## 2015-04-04 MED ORDER — LOPERAMIDE HCL 2 MG PO TABS: 2.0000 mg | ORAL_TABLET | Freq: Four times a day (QID) | ORAL | Status: DC | PRN

## 2015-04-04 MED ORDER — METRONIDAZOLE 500 MG PO TABS: 500.0000 mg | ORAL_TABLET | Freq: Three times a day (TID) | ORAL | Status: DC

## 2015-04-04 NOTE — Patient Instructions (Addendum)
Avoid milk  Diverticulitis Diverticulitis is inflammation or infection of small pouches in your colon that form when you have a condition called diverticulosis. The pouches in your colon are called diverticula. Your colon, or large intestine, is where water is absorbed and stool is formed. Complications of diverticulitis can include:  Bleeding.  Severe infection.  Severe pain.  Perforation of your colon.  Obstruction of your colon. CAUSES  Diverticulitis is caused by bacteria. Diverticulitis happens when stool becomes trapped in diverticula. This allows bacteria to grow in the diverticula, which can lead to inflammation and infection. RISK FACTORS People with diverticulosis are at risk for diverticulitis. Eating a diet that does not include enough fiber from fruits and vegetables may make diverticulitis more likely to develop. SYMPTOMS  Symptoms of diverticulitis may include:  Abdominal pain and tenderness. The pain is normally located on the left side of the abdomen, but may occur in other areas.  Fever and chills.  Bloating.  Cramping.  Nausea.  Vomiting.  Constipation.  Diarrhea.  Blood in your stool. DIAGNOSIS  Your health care provider will ask you about your medical history and do a physical exam. You may need to have tests done because many medical conditions can cause the same symptoms as diverticulitis. Tests may include:  Blood tests.  Urine tests.  Imaging tests of the abdomen, including X-rays and CT scans. When your condition is under control, your health care provider may recommend that you have a colonoscopy. A colonoscopy can show how severe your diverticula are and whether something else is causing your symptoms. TREATMENT  Most cases of diverticulitis are mild and can be treated at home. Treatment may include:  Taking over-the-counter pain medicines.  Following a clear liquid diet.  Taking antibiotic medicines by mouth for 7-10 days. More  severe cases may be treated at a hospital. Treatment may include:  Not eating or drinking.  Taking prescription pain medicine.  Receiving antibiotic medicines through an IV tube.  Receiving fluids and nutrition through an IV tube.  Surgery. HOME CARE INSTRUCTIONS   Follow your health care provider's instructions carefully.  Follow a full liquid diet or other diet as directed by your health care provider. After your symptoms improve, your health care provider may tell you to change your diet. He or she may recommend you eat a high-fiber diet. Fruits and vegetables are good sources of fiber. Fiber makes it easier to pass stool.  Take fiber supplements or probiotics as directed by your health care provider.  Only take medicines as directed by your health care provider.  Keep all your follow-up appointments. SEEK MEDICAL CARE IF:   Your pain does not improve.  You have a hard time eating food.  Your bowel movements do not return to normal. SEEK IMMEDIATE MEDICAL CARE IF:   Your pain becomes worse.  Your symptoms do not get better.  Your symptoms suddenly get worse.  You have a fever.  You have repeated vomiting.  You have bloody or black, tarry stools. MAKE SURE YOU:   Understand these instructions.  Will watch your condition.  Will get help right away if you are not doing well or get worse.   This information is not intended to replace advice given to you by your health care provider. Make sure you discuss any questions you have with your health care provider.   Document Released: 11/18/2004 Document Revised: 02/13/2013 Document Reviewed: 01/03/2013 Elsevier Interactive Patient Education [employer].

## 2015-04-04 NOTE — Assessment & Plan Note (Signed)
Cipro, Flagyl x 10 d

## 2015-04-04 NOTE — Assessment & Plan Note (Signed)
Better  OTC meds

## 2015-04-04 NOTE — Assessment & Plan Note (Signed)
Wt Readings from Last 3 Encounters:  03/20/15 198 lb (89.812 kg)  01/27/15 198 lb (89.812 kg)  10/18/14 199 lb (90.266 kg)

## 2015-04-04 NOTE — Assessment & Plan Note (Signed)
Increase the dose of Wellbutrin 300 mg/d

## 2015-04-04 NOTE — Assessment & Plan Note (Signed)
2017 /due to protein drinks - stop Imodium AD

## 2015-04-04 NOTE — Progress Notes (Signed)
Pre visit review using our clinic review tool, if applicable. No additional management support is needed unless otherwise documented below in the visit note. 

## 2015-04-04 NOTE — Assessment & Plan Note (Signed)
Needs new hearing aids

## 2015-04-04 NOTE — Progress Notes (Signed)
Subjective:  Patient ID: Kenneth Williams, male    DOB: 06/07/1935  Age: 80 y.o. MRN: [id number]  CC: No chief complaint on file.   HPI Kenneth Williams presents for HTN, depression, memory loss f/u. C/o cough x 2 weeks  Outpatient Prescriptions Prior to Visit  Medication Sig Dispense Refill  . aspirin 81 MG tablet Take 162 mg by mouth daily.    . cholecalciferol (VITAMIN D) 1000 UNITS tablet Take 2,000 Units by mouth daily.      . clotrimazole-betamethasone (LOTRISONE) cream Apply 1 application topically 2 (two) times daily. 45 g 1  . colchicine 0.6 MG tablet Take 1 tablet (0.6 mg total) by mouth daily as needed. 90 tablet 3  . Cyanocobalamin (VITAMIN B 12 PO) Take 1 tablet by mouth daily.      Marland Kitchen labetalol (NORMODYNE) 200 MG tablet Take 2 tablets (400 mg total) by mouth 2 (two) times daily. 360 tablet 3  . levothyroxine (SYNTHROID, LEVOTHROID) 50 MCG tablet Take 1 tablet (50 mcg total) by mouth daily before breakfast. 90 tablet 3  . losartan (COZAAR) 100 MG tablet TAKE 1 TABLET (100 MG TOTAL) BY MOUTH DAILY. 90 tablet 3  . simvastatin (ZOCOR) 40 MG tablet Take 1 tablet (40 mg total) by mouth at bedtime. 90 tablet 3  . buPROPion (WELLBUTRIN XL) 150 MG 24 hr tablet Take 1 tablet (150 mg total) by mouth daily. 30 tablet 5  . losartan (COZAAR) 100 MG tablet Take 1 tablet (100 mg total) by mouth daily. 90 tablet 3   No facility-administered medications prior to visit.    ROS Review of Systems  Constitutional: Negative for appetite change, fatigue and unexpected weight change.  HENT: Positive for congestion. Negative for nosebleeds, sneezing, sore throat and trouble swallowing.   Eyes: Negative for itching and visual disturbance.  Respiratory: Positive for cough. Negative for shortness of breath.   Cardiovascular: Negative for chest pain, palpitations and leg swelling.  Gastrointestinal: Positive for abdominal pain and diarrhea. Negative for nausea, blood in stool and abdominal distention.    Genitourinary: Negative for frequency and hematuria.  Musculoskeletal: Negative for back pain, joint swelling, gait problem and neck pain.  Skin: Negative for rash.  Neurological: Negative for dizziness, tremors, speech difficulty and weakness.  Psychiatric/Behavioral: Positive for decreased concentration. Negative for sleep disturbance, dysphoric mood and agitation. The patient is nervous/anxious.     Objective:  BP 148/82 mmHg  Pulse 63  Temp(Src) 97.4 F (36.3 C) (Oral)  Ht 6\' 1"  (1.854 m)  SpO2 97%  BP Readings from Last 3 Encounters:  04/04/15 148/82  01/27/15 130/85  10/18/14 130/70    Wt Readings from Last 3 Encounters:  03/20/15 198 lb (89.812 kg)  01/27/15 198 lb (89.812 kg)  10/18/14 199 lb (90.266 kg)    Physical Exam  Constitutional: He is oriented to person, place, and time. He appears well-developed. No distress.  NAD  HENT:  Mouth/Throat: Oropharynx is clear and moist.  Eyes: Conjunctivae are normal. Pupils are equal, round, and reactive to light.  Neck: Normal range of motion. No JVD present. No thyromegaly present.  Cardiovascular: Normal rate, regular rhythm, normal heart sounds and intact distal pulses.  Exam reveals no gallop and no friction rub.   No murmur heard. Pulmonary/Chest: Effort normal. No respiratory distress. He has wheezes. He has no rales. He exhibits no tenderness.  Abdominal: Soft. Bowel sounds are normal. He exhibits distension. He exhibits no mass. There is tenderness. There is no rebound and  no guarding.  Musculoskeletal: Normal range of motion. He exhibits no edema or tenderness.  Lymphadenopathy:    He has no cervical adenopathy.  Neurological: He is alert and oriented to person, place, and time. He has normal reflexes. No cranial nerve deficit. He exhibits normal muscle tone. He displays a negative Romberg sign. Coordination and gait normal.  Skin: Skin is warm and dry. No rash noted.  Psychiatric: He has a normal mood and affect.  His behavior is normal. Judgment and thought content normal.  LLQ is tender; no rebound  Lab Results  Component Value Date   WBC 10.0 03/25/2014   HGB 14.9 03/25/2014   HCT 44.3 03/25/2014   PLT 281.0 03/25/2014   GLUCOSE 84 03/25/2014   CHOL 193 03/25/2014   TRIG 314.0* 03/25/2014   HDL 32.80* 03/25/2014   LDLDIRECT 123.0 03/25/2014   LDLCALC 144* 09/12/2013   ALT 24 03/25/2014   AST 16 03/25/2014   NA 135 03/25/2014   K 4.8 03/25/2014   CL 105 03/25/2014   CREATININE 1.45 03/25/2014   BUN 25* 03/25/2014   CO2 24 03/25/2014   TSH 2.44 03/25/2014   PSA 2.15 03/25/2014   INR 1.0 ratio 02/25/2009    No results found.  Assessment & Plan:   Diagnoses and all orders for this visit:  Acute upper respiratory infection  Loss of weight  Diarrhea, unspecified type  Essential hypertension  Diverticulitis of colon  Depression  Memory loss  Other orders -     loperamide (IMODIUM A-D) 2 MG tablet; Take 1 tablet (2 mg total) by mouth 4 (four) times daily as needed for diarrhea or loose stools. -     buPROPion (WELLBUTRIN XL) 300 MG 24 hr tablet; Take 1 tablet (300 mg total) by mouth daily. -     metroNIDAZOLE (FLAGYL) 500 MG tablet; Take 1 tablet (500 mg total) by mouth 3 (three) times daily. -     ciprofloxacin (CIPRO) 500 MG tablet; Take 1 tablet (500 mg total) by mouth 2 (two) times daily.   I have discontinued Kenneth Williams buPROPion. I am also having him start on loperamide, buPROPion, metroNIDAZOLE, and ciprofloxacin. Additionally, I am having him maintain his Cyanocobalamin (VITAMIN B 12 PO), cholecalciferol, colchicine, aspirin, clotrimazole-betamethasone, labetalol, levothyroxine, simvastatin, and losartan.  Meds ordered this encounter  Medications  . loperamide (IMODIUM A-D) 2 MG tablet    Sig: Take 1 tablet (2 mg total) by mouth 4 (four) times daily as needed for diarrhea or loose stools.    Dispense:  30 tablet    Refill:  0  . buPROPion (WELLBUTRIN XL) 300  MG 24 hr tablet    Sig: Take 1 tablet (300 mg total) by mouth daily.    Dispense:  30 tablet    Refill:  5  . metroNIDAZOLE (FLAGYL) 500 MG tablet    Sig: Take 1 tablet (500 mg total) by mouth 3 (three) times daily.    Dispense:  30 tablet    Refill:  0  . ciprofloxacin (CIPRO) 500 MG tablet    Sig: Take 1 tablet (500 mg total) by mouth 2 (two) times daily.    Dispense:  20 tablet    Refill:  0     Follow-up: Return in about 4 weeks (around 05/02/2015) for a follow-up visit.  Walker Kehr, MD

## 2015-04-04 NOTE — Assessment & Plan Note (Signed)
Chronic Multiple Rx intolerances On Losartan, Atenolol Pt is eating fast food a lot - needs NAS good diet

## 2015-04-05 ENCOUNTER — Encounter: Payer: Self-pay | Admitting: Internal Medicine

## 2015-04-08 ENCOUNTER — Telehealth: Payer: Self-pay | Admitting: Internal Medicine

## 2015-04-08 NOTE — Telephone Encounter (Signed)
Try 1 bid thx

## 2015-04-08 NOTE — Telephone Encounter (Signed)
Pt daughter called in said that meds that he is taking is making him sick.  What can he do?  Does he have to take the 3 a day?    Can you call her.  [place]

## 2015-04-08 NOTE — Telephone Encounter (Signed)
Please advise on Flagyl sig.

## 2015-04-09 NOTE — Telephone Encounter (Signed)
Called Kenneth Williams no answer LMOM w/MD response...Kenneth Williams

## 2015-05-09 ENCOUNTER — Ambulatory Visit: Payer: Medicare Other | Admitting: Internal Medicine

## 2015-05-14 ENCOUNTER — Telehealth: Payer: Self-pay | Admitting: Internal Medicine

## 2015-05-14 NOTE — Telephone Encounter (Signed)
Pt's daughter has called in concerned about his bp. The last time today it did go up to 183/100 and his ankles are swollen. She wanted me to let you know she made him a pork loin a couple days ago and he has been eating it. She's wondering if that could be the cause. I made an appointment for our next available tomorrow at 5pm with Dr. Jenny Reichmann.  She doesn't know what to do. Is this something that should be addressed earlier than tomorrow or can it wait till later in week.  Please advise

## 2015-05-14 NOTE — Telephone Encounter (Signed)
I was able to get patient scheduled with you for tomorrow morning

## 2015-05-15 ENCOUNTER — Encounter: Payer: Self-pay | Admitting: Internal Medicine

## 2015-05-15 ENCOUNTER — Ambulatory Visit (INDEPENDENT_AMBULATORY_CARE_PROVIDER_SITE_OTHER): Payer: Medicare Other | Admitting: Internal Medicine

## 2015-05-15 ENCOUNTER — Ambulatory Visit: Payer: Medicare Other | Admitting: Internal Medicine

## 2015-05-15 VITALS — BP 182/94 | HR 65 | Ht 73.0 in | Wt 196.0 lb

## 2015-05-15 DIAGNOSIS — N259 Disorder resulting from impaired renal tubular function, unspecified: Secondary | ICD-10-CM

## 2015-05-15 DIAGNOSIS — R600 Localized edema: Secondary | ICD-10-CM | POA: Diagnosis not present

## 2015-05-15 DIAGNOSIS — I10 Essential (primary) hypertension: Secondary | ICD-10-CM | POA: Diagnosis not present

## 2015-05-15 DIAGNOSIS — E039 Hypothyroidism, unspecified: Secondary | ICD-10-CM

## 2015-05-15 DIAGNOSIS — R413 Other amnesia: Secondary | ICD-10-CM

## 2015-05-15 MED ORDER — SPIRONOLACTONE 25 MG PO TABS: 25.0000 mg | ORAL_TABLET | Freq: Every day | ORAL | Status: DC

## 2015-05-15 NOTE — Progress Notes (Signed)
Pre visit review using our clinic review tool, if applicable. No additional management support is needed unless otherwise documented below in the visit note. 

## 2015-05-15 NOTE — Assessment & Plan Note (Signed)
Labs in 1 mo

## 2015-05-15 NOTE — Assessment & Plan Note (Signed)
Will monitor

## 2015-05-15 NOTE — Progress Notes (Signed)
Subjective:  Patient ID: Kenneth Williams, male    DOB: [birth year]  Age: 80 y.o. MRN: [id number]  CC: Hypertension   HPI MYAIRE WING presents for elev BP -- SBP 180s at home. C/o edema x 1 week  Outpatient Prescriptions Prior to Visit  Medication Sig Dispense Refill  . aspirin 81 MG tablet Take 162 mg by mouth daily.    Marland Kitchen buPROPion (WELLBUTRIN XL) 300 MG 24 hr tablet Take 1 tablet (300 mg total) by mouth daily. 30 tablet 5  . cholecalciferol (VITAMIN D) 1000 UNITS tablet Take 2,000 Units by mouth daily.      . clotrimazole-betamethasone (LOTRISONE) cream Apply 1 application topically 2 (two) times daily. 45 g 1  . colchicine 0.6 MG tablet Take 1 tablet (0.6 mg total) by mouth daily as needed. 90 tablet 3  . Cyanocobalamin (VITAMIN B 12 PO) Take 1 tablet by mouth daily.      Marland Kitchen labetalol (NORMODYNE) 200 MG tablet Take 2 tablets (400 mg total) by mouth 2 (two) times daily. 360 tablet 3  . levothyroxine (SYNTHROID, LEVOTHROID) 50 MCG tablet Take 1 tablet (50 mcg total) by mouth daily before breakfast. 90 tablet 3  . losartan (COZAAR) 100 MG tablet TAKE 1 TABLET (100 MG TOTAL) BY MOUTH DAILY. 90 tablet 3  . simvastatin (ZOCOR) 40 MG tablet Take 1 tablet (40 mg total) by mouth at bedtime. 90 tablet 3  . loperamide (IMODIUM A-D) 2 MG tablet Take 1 tablet (2 mg total) by mouth 4 (four) times daily as needed for diarrhea or loose stools. (Patient not taking: Reported on 05/15/2015) 30 tablet 0  . metroNIDAZOLE (FLAGYL) 500 MG tablet Take 1 tablet (500 mg total) by mouth 3 (three) times daily. (Patient not taking: Reported on 05/15/2015) 30 tablet 0  . ciprofloxacin (CIPRO) 500 MG tablet Take 1 tablet (500 mg total) by mouth 2 (two) times daily. (Patient not taking: Reported on 05/15/2015) 20 tablet 0   No facility-administered medications prior to visit.    ROS Review of Systems  Constitutional: Negative for appetite change, fatigue and unexpected weight change.  HENT: Negative for congestion,  nosebleeds, sneezing, sore throat and trouble swallowing.   Eyes: Negative for itching and visual disturbance.  Respiratory: Negative for cough.   Cardiovascular: Negative for chest pain, palpitations and leg swelling.  Gastrointestinal: Negative for nausea, diarrhea, blood in stool and abdominal distention.  Genitourinary: Negative for frequency and hematuria.  Musculoskeletal: Negative for back pain, joint swelling, gait problem and neck pain.  Skin: Negative for rash.  Neurological: Negative for dizziness, tremors, speech difficulty and weakness.  Psychiatric/Behavioral: Negative for suicidal ideas, sleep disturbance, dysphoric mood and agitation. The patient is not nervous/anxious.     Objective:  BP 182/94 mmHg  Pulse 65  Ht 6\' 1"  (1.854 m)  Wt 196 lb (88.905 kg)  BMI 25.86 kg/m2  SpO2 97%  BP Readings from Last 3 Encounters:  05/15/15 182/94  04/04/15 148/82  01/27/15 130/85    Wt Readings from Last 3 Encounters:  05/15/15 196 lb (88.905 kg)  03/20/15 198 lb (89.812 kg)  01/27/15 198 lb (89.812 kg)    Physical Exam  Constitutional: He is oriented to person, place, and time. He appears well-developed. No distress.  NAD  HENT:  Mouth/Throat: Oropharynx is clear and moist.  Eyes: Conjunctivae are normal. Pupils are equal, round, and reactive to light.  Neck: Normal range of motion. No JVD present. No thyromegaly present.  Cardiovascular: Normal rate, regular rhythm,  normal heart sounds and intact distal pulses.  Exam reveals no gallop and no friction rub.   No murmur heard. Pulmonary/Chest: Effort normal and breath sounds normal. No respiratory distress. He has no wheezes. He has no rales. He exhibits no tenderness.  Abdominal: Soft. Bowel sounds are normal. He exhibits no distension and no mass. There is no tenderness. There is no rebound and no guarding.  Musculoskeletal: Normal range of motion. He exhibits no edema or tenderness.  Lymphadenopathy:    He has no  cervical adenopathy.  Neurological: He is alert and oriented to person, place, and time. He has normal reflexes. No cranial nerve deficit. He exhibits normal muscle tone. He displays a negative Romberg sign. Coordination and gait normal.  Skin: Skin is warm and dry. No rash noted.  Psychiatric: His behavior is normal. Judgment normal.  edema - trace to 1+ B  Lab Results  Component Value Date   WBC 10.0 03/25/2014   HGB 14.9 03/25/2014   HCT 44.3 03/25/2014   PLT 281.0 03/25/2014   GLUCOSE 84 03/25/2014   CHOL 193 03/25/2014   TRIG 314.0* 03/25/2014   HDL 32.80* 03/25/2014   LDLDIRECT 123.0 03/25/2014   LDLCALC 144* 09/12/2013   ALT 24 03/25/2014   AST 16 03/25/2014   NA 135 03/25/2014   K 4.8 03/25/2014   CL 105 03/25/2014   CREATININE 1.45 03/25/2014   BUN 25* 03/25/2014   CO2 24 03/25/2014   TSH 2.44 03/25/2014   PSA 2.15 03/25/2014   INR 1.0 ratio 02/25/2009    No results found.  Assessment & Plan:   Orla was seen today for hypertension.  Diagnoses and all orders for this visit:  Essential hypertension  Localized edema  Disorder resulting from impaired renal function  Other orders -     spironolactone (ALDACTONE) 25 MG tablet; Take 1 tablet (25 mg total) by mouth daily.  I have discontinued Mr. Tranchina ciprofloxacin. I am also having him start on spironolactone. Additionally, I am having him maintain his Cyanocobalamin (VITAMIN B 12 PO), cholecalciferol, colchicine, aspirin, clotrimazole-betamethasone, labetalol, levothyroxine, simvastatin, losartan, loperamide, buPROPion, and metroNIDAZOLE.  Meds ordered this encounter  Medications  . spironolactone (ALDACTONE) 25 MG tablet    Sig: Take 1 tablet (25 mg total) by mouth daily.    Dispense:  30 tablet    Refill:  11     Follow-up: Return in about 4 weeks (around 06/12/2015) for a follow-up visit.  Walker Kehr, MD

## 2015-05-15 NOTE — Assessment & Plan Note (Signed)
On Losartan, Labetalol NAS good diet Added Spironolactone

## 2015-05-15 NOTE — Assessment & Plan Note (Signed)
Pt needs to get hearing aids

## 2015-05-15 NOTE — Assessment & Plan Note (Signed)
3/17 new Start Spironolactone

## 2015-06-02 ENCOUNTER — Encounter: Payer: Self-pay | Admitting: Internal Medicine

## 2015-06-02 ENCOUNTER — Ambulatory Visit (INDEPENDENT_AMBULATORY_CARE_PROVIDER_SITE_OTHER): Payer: Medicare Other | Admitting: Internal Medicine

## 2015-06-02 VITALS — BP 140/80 | HR 62 | Temp 97.9°F | Ht 73.0 in | Wt 194.2 lb

## 2015-06-02 DIAGNOSIS — N400 Enlarged prostate without lower urinary tract symptoms: Secondary | ICD-10-CM | POA: Diagnosis not present

## 2015-06-02 DIAGNOSIS — L570 Actinic keratosis: Secondary | ICD-10-CM | POA: Diagnosis not present

## 2015-06-02 DIAGNOSIS — I10 Essential (primary) hypertension: Secondary | ICD-10-CM

## 2015-06-02 MED ORDER — ALFUZOSIN HCL ER 10 MG PO TB24: 10.0000 mg | ORAL_TABLET | Freq: Every day | ORAL | Status: DC

## 2015-06-02 NOTE — Patient Instructions (Signed)
   Postprocedure instructions :     Keep the wounds clean. You can wash them with liquid soap and water. Pat dry with gauze or a Kleenex tissue  Before applying antibiotic ointment and a Band-Aid.   You need to report immediately  if  any signs of infection develop.    

## 2015-06-02 NOTE — Assessment & Plan Note (Addendum)
4/17 worse - will try Uroxatrol

## 2015-06-02 NOTE — Progress Notes (Signed)
Subjective:  Patient ID: Kenneth Williams, Kenneth    DOB: 03-Jun-1935  Age: 80 y.o. MRN: [id number]  CC: wart   HPI Kenneth Williams presents for a skin procedure. C/o BPH sx's  Outpatient Prescriptions Prior to Visit  Medication Sig Dispense Refill  . aspirin 81 MG tablet Take 162 mg by mouth daily.    Marland Kitchen buPROPion (WELLBUTRIN XL) 300 MG 24 hr tablet Take 1 tablet (300 mg total) by mouth daily. 30 tablet 5  . cholecalciferol (VITAMIN D) 1000 UNITS tablet Take 2,000 Units by mouth daily.      . clotrimazole-betamethasone (LOTRISONE) cream Apply 1 application topically 2 (two) times daily. 45 g 1  . colchicine 0.6 MG tablet Take 1 tablet (0.6 mg total) by mouth daily as needed. 90 tablet 3  . Cyanocobalamin (VITAMIN B 12 PO) Take 1 tablet by mouth daily.      Marland Kitchen labetalol (NORMODYNE) 200 MG tablet Take 2 tablets (400 mg total) by mouth 2 (two) times daily. 360 tablet 3  . levothyroxine (SYNTHROID, LEVOTHROID) 50 MCG tablet Take 1 tablet (50 mcg total) by mouth daily before breakfast. 90 tablet 3  . loperamide (IMODIUM A-D) 2 MG tablet Take 1 tablet (2 mg total) by mouth 4 (four) times daily as needed for diarrhea or loose stools. 30 tablet 0  . losartan (COZAAR) 100 MG tablet TAKE 1 TABLET (100 MG TOTAL) BY MOUTH DAILY. 90 tablet 3  . simvastatin (ZOCOR) 40 MG tablet Take 1 tablet (40 mg total) by mouth at bedtime. 90 tablet 3  . spironolactone (ALDACTONE) 25 MG tablet Take 1 tablet (25 mg total) by mouth daily. 30 tablet 11  . metroNIDAZOLE (FLAGYL) 500 MG tablet Take 1 tablet (500 mg total) by mouth 3 (three) times daily. (Patient not taking: Reported on 06/02/2015) 30 tablet 0   No facility-administered medications prior to visit.    ROS Review of Systems  Constitutional: Negative for appetite change, fatigue and unexpected weight change.  HENT: Negative for congestion, nosebleeds, sneezing, sore throat and trouble swallowing.   Eyes: Negative for itching and visual disturbance.    Respiratory: Negative for cough.   Cardiovascular: Negative for chest pain, palpitations and leg swelling.  Gastrointestinal: Negative for nausea, diarrhea, blood in stool and abdominal distention.  Genitourinary: Positive for urgency, decreased urine volume and difficulty urinating. Negative for frequency and hematuria.  Musculoskeletal: Negative for back pain, joint swelling, gait problem and neck pain.  Skin: Negative for rash.  Neurological: Negative for dizziness, tremors, speech difficulty and weakness.  Psychiatric/Behavioral: Negative for sleep disturbance, dysphoric mood and agitation. The patient is not nervous/anxious.     Objective:  BP 140/80 mmHg  Pulse 62  Temp(Src) 97.9 F (36.6 C) (Oral)  Ht 6\' 1"  (1.854 m)  Wt 194 lb 4 oz (88.111 kg)  BMI 25.63 kg/m2  SpO2 97%  BP Readings from Last 3 Encounters:  06/02/15 140/80  05/15/15 182/94  04/04/15 148/82    Wt Readings from Last 3 Encounters:  06/02/15 194 lb 4 oz (88.111 kg)  05/15/15 196 lb (88.905 kg)  03/20/15 198 lb (89.812 kg)    Physical Exam  Constitutional: He is oriented to person, place, and time. He appears well-developed. No distress.  NAD  HENT:  Mouth/Throat: Oropharynx is clear and moist.  Eyes: Conjunctivae are normal. Pupils are equal, round, and reactive to light.  Neck: Normal range of motion. No JVD present. No thyromegaly present.  Cardiovascular: Normal rate, regular rhythm, normal heart  sounds and intact distal pulses.  Exam reveals no gallop and no friction rub.   No murmur heard. Pulmonary/Chest: Effort normal and breath sounds normal. No respiratory distress. He has no wheezes. He has no rales. He exhibits no tenderness.  Abdominal: Soft. Bowel sounds are normal. He exhibits no distension and no mass. There is no tenderness. There is no rebound and no guarding.  Musculoskeletal: Normal range of motion. He exhibits no edema or tenderness.  Lymphadenopathy:    He has no cervical  adenopathy.  Neurological: He is alert and oriented to person, place, and time. He has normal reflexes. No cranial nerve deficit. He exhibits normal muscle tone. He displays a negative Romberg sign. Coordination and gait normal.  Skin: Skin is warm and dry. No rash noted.  Psychiatric: He has a normal mood and affect. His behavior is normal. Judgment and thought content normal.  AKs    Procedure Note :     Procedure : Cryosurgery   Indication:   Actinic keratosis(es)   Risks including unsuccessful procedure , bleeding, infection, bruising, scar, a need for a repeat  procedure and others were explained to the patient in detail as well as the benefits. Informed consent was obtained verbally.    2 lesion(s)  (one on R neck, one on R hand) were treated with liquid nitrogen on a Q-tip in a usual fasion . Band-Aid was applied and antibiotic ointment was given for a later use.   Tolerated well. Complications none.     Lab Results  Component Value Date   WBC 10.0 03/25/2014   HGB 14.9 03/25/2014   HCT 44.3 03/25/2014   PLT 281.0 03/25/2014   GLUCOSE 84 03/25/2014   CHOL 193 03/25/2014   TRIG 314.0* 03/25/2014   HDL 32.80* 03/25/2014   LDLDIRECT 123.0 03/25/2014   LDLCALC 144* 09/12/2013   ALT 24 03/25/2014   AST 16 03/25/2014   NA 135 03/25/2014   K 4.8 03/25/2014   CL 105 03/25/2014   CREATININE 1.45 03/25/2014   BUN 25* 03/25/2014   CO2 24 03/25/2014   TSH 2.44 03/25/2014   PSA 2.15 03/25/2014   INR 1.0 ratio 02/25/2009    No results found.  Assessment & Plan:   There are no diagnoses linked to this encounter. I am having Kenneth Williams maintain his Cyanocobalamin (VITAMIN B 12 PO), cholecalciferol, colchicine, aspirin, clotrimazole-betamethasone, labetalol, levothyroxine, simvastatin, losartan, loperamide, buPROPion, metroNIDAZOLE, and spironolactone.  No orders of the defined types were placed in this encounter.     Follow-up: No Follow-up on file.  Walker Kehr,  MD

## 2015-06-02 NOTE — Assessment & Plan Note (Signed)
See procedure 

## 2015-06-02 NOTE — Assessment & Plan Note (Signed)
BP Readings from Last 3 Encounters:  06/02/15 140/80  05/15/15 182/94  04/04/15 148/82  better

## 2015-06-11 DIAGNOSIS — H90A22 Sensorineural hearing loss, unilateral, left ear, with restricted hearing on the contralateral side: Secondary | ICD-10-CM | POA: Diagnosis not present

## 2015-06-11 DIAGNOSIS — H90A31 Mixed conductive and sensorineural hearing loss, unilateral, right ear with restricted hearing on the contralateral side: Secondary | ICD-10-CM | POA: Diagnosis not present

## 2015-06-16 ENCOUNTER — Ambulatory Visit: Payer: Medicare Other | Admitting: Internal Medicine

## 2015-06-18 ENCOUNTER — Encounter: Payer: Self-pay | Admitting: Internal Medicine

## 2015-06-18 ENCOUNTER — Ambulatory Visit (INDEPENDENT_AMBULATORY_CARE_PROVIDER_SITE_OTHER): Payer: Medicare Other | Admitting: Internal Medicine

## 2015-06-18 VITALS — BP 140/84 | HR 60 | Wt 193.0 lb

## 2015-06-18 DIAGNOSIS — H9193 Unspecified hearing loss, bilateral: Secondary | ICD-10-CM | POA: Diagnosis not present

## 2015-06-18 DIAGNOSIS — R634 Abnormal weight loss: Secondary | ICD-10-CM

## 2015-06-18 DIAGNOSIS — E039 Hypothyroidism, unspecified: Secondary | ICD-10-CM

## 2015-06-18 DIAGNOSIS — R5381 Other malaise: Secondary | ICD-10-CM | POA: Insufficient documentation

## 2015-06-18 DIAGNOSIS — I10 Essential (primary) hypertension: Secondary | ICD-10-CM

## 2015-06-18 DIAGNOSIS — R197 Diarrhea, unspecified: Secondary | ICD-10-CM

## 2015-06-18 DIAGNOSIS — G459 Transient cerebral ischemic attack, unspecified: Secondary | ICD-10-CM | POA: Diagnosis not present

## 2015-06-18 DIAGNOSIS — K909 Intestinal malabsorption, unspecified: Secondary | ICD-10-CM

## 2015-06-18 NOTE — Assessment & Plan Note (Signed)
Discussed diet GI ref if relapsed

## 2015-06-18 NOTE — Assessment & Plan Note (Signed)
On Losartan, Labetalol - better NAS good diet

## 2015-06-18 NOTE — Patient Instructions (Signed)
Start walking 

## 2015-06-18 NOTE — Assessment & Plan Note (Signed)
The pt is working with an Nurse, [facility]

## 2015-06-18 NOTE — Assessment & Plan Note (Signed)
No relapse ASA 
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
## 2015-06-18 NOTE — Assessment & Plan Note (Signed)
2017 it is a serious problem. Exercise importance was emphasized

## 2015-06-18 NOTE — Progress Notes (Signed)
Pre visit review using our clinic review tool, if applicable. No additional management support is needed unless otherwise documented below in the visit note. 

## 2015-06-18 NOTE — Assessment & Plan Note (Signed)
Wt Readings from Last 3 Encounters:  06/18/15 193 lb (87.544 kg)  06/02/15 194 lb 4 oz (88.111 kg)  05/15/15 196 lb (88.905 kg)  stable

## 2015-06-18 NOTE — Assessment & Plan Note (Signed)
On Levothroid 

## 2015-07-17 ENCOUNTER — Ambulatory Visit: Payer: Medicare Other | Admitting: Family

## 2015-08-08 DIAGNOSIS — M542 Cervicalgia: Secondary | ICD-10-CM | POA: Diagnosis not present

## 2015-08-18 DIAGNOSIS — Z961 Presence of intraocular lens: Secondary | ICD-10-CM | POA: Diagnosis not present

## 2015-09-01 DIAGNOSIS — H906 Mixed conductive and sensorineural hearing loss, bilateral: Secondary | ICD-10-CM | POA: Diagnosis not present

## 2015-09-11 ENCOUNTER — Telehealth: Payer: Self-pay | Admitting: *Deleted

## 2015-09-11 NOTE — Telephone Encounter (Signed)
Pt scheduled to see PCP 09/15/15. Dr. Vicie Mutters is requesting EKG, 2 view CXR, CBC w/Diff. PT/PTT and CMET be performed and faxed to him at [phone] Attn: Olivia Mackie, LPN.

## 2015-09-15 ENCOUNTER — Ambulatory Visit (INDEPENDENT_AMBULATORY_CARE_PROVIDER_SITE_OTHER): Payer: Medicare Other | Admitting: Internal Medicine

## 2015-09-15 ENCOUNTER — Encounter: Payer: Self-pay | Admitting: Internal Medicine

## 2015-09-15 ENCOUNTER — Other Ambulatory Visit (INDEPENDENT_AMBULATORY_CARE_PROVIDER_SITE_OTHER): Payer: Medicare Other

## 2015-09-15 ENCOUNTER — Ambulatory Visit (INDEPENDENT_AMBULATORY_CARE_PROVIDER_SITE_OTHER)
Admission: RE | Admit: 2015-09-15 | Discharge: 2015-09-15 | Disposition: A | Discharge status: Home / Self Care | Payer: Medicare Other | Source: Ambulatory Visit | Attending: Internal Medicine | Admitting: Internal Medicine

## 2015-09-15 VITALS — BP 120/78 | HR 66 | Wt 195.0 lb

## 2015-09-15 DIAGNOSIS — E038 Other specified hypothyroidism: Secondary | ICD-10-CM | POA: Diagnosis not present

## 2015-09-15 DIAGNOSIS — Z01818 Encounter for other preprocedural examination: Secondary | ICD-10-CM

## 2015-09-15 DIAGNOSIS — R748 Abnormal levels of other serum enzymes: Secondary | ICD-10-CM

## 2015-09-15 DIAGNOSIS — R413 Other amnesia: Secondary | ICD-10-CM | POA: Diagnosis not present

## 2015-09-15 DIAGNOSIS — F32A Depression, unspecified: Secondary | ICD-10-CM

## 2015-09-15 DIAGNOSIS — F329 Major depressive disorder, single episode, unspecified: Secondary | ICD-10-CM

## 2015-09-15 DIAGNOSIS — E034 Atrophy of thyroid (acquired): Secondary | ICD-10-CM | POA: Diagnosis not present

## 2015-09-15 DIAGNOSIS — R0602 Shortness of breath: Secondary | ICD-10-CM | POA: Diagnosis not present

## 2015-09-15 DIAGNOSIS — R7989 Other specified abnormal findings of blood chemistry: Secondary | ICD-10-CM

## 2015-09-15 LAB — CBC WITH DIFFERENTIAL/PLATELET
BASOS ABS: 0 10*3/uL (ref 0.0–0.1)
Basophils Relative: 0.3 % (ref 0.0–3.0)
EOS PCT: 3.9 % (ref 0.0–5.0)
Eosinophils Absolute: 0.4 10*3/uL (ref 0.0–0.7)
HCT: 43 % (ref 39.0–52.0)
HEMOGLOBIN: 14.4 g/dL (ref 13.0–17.0)
Lymphocytes Relative: 21.2 % (ref 12.0–46.0)
Lymphs Abs: 2 10*3/uL (ref 0.7–4.0)
MCHC: 33.5 g/dL (ref 30.0–36.0)
MCV: 86.3 fl (ref 78.0–100.0)
MONOS PCT: 7.8 % (ref 3.0–12.0)
Monocytes Absolute: 0.7 10*3/uL (ref 0.1–1.0)
NEUTROS PCT: 66.8 % (ref 43.0–77.0)
Neutro Abs: 6.4 10*3/uL (ref 1.4–7.7)
PLATELETS: 268 10*3/uL (ref 150.0–400.0)
RBC: 4.98 Mil/uL (ref 4.22–5.81)
RDW: 13.2 % (ref 11.5–15.5)
WBC: 9.6 10*3/uL (ref 4.0–10.5)

## 2015-09-15 LAB — BASIC METABOLIC PANEL
BUN: 24 mg/dL — ABNORMAL HIGH (ref 6–23)
CO2: 26 mEq/L (ref 19–32)
Calcium: 9.8 mg/dL (ref 8.4–10.5)
Chloride: 107 mEq/L (ref 96–112)
Creatinine, Ser: 1.79 mg/dL — ABNORMAL HIGH (ref 0.40–1.50)
GFR: 39.22 mL/min — AB (ref >60.00)
Glucose, Bld: 72 mg/dL (ref 70–99)
POTASSIUM: 4.4 meq/L (ref 3.5–5.1)
SODIUM: 140 meq/L (ref 135–145)

## 2015-09-15 LAB — HEPATIC FUNCTION PANEL
ALBUMIN: 3.9 g/dL (ref 3.5–5.2)
ALK PHOS: 61 U/L (ref 39–117)
ALT: 19 U/L (ref 0–53)
AST: 13 U/L (ref 0–37)
Bilirubin, Direct: 0.1 mg/dL (ref 0.0–0.3)
Total Bilirubin: 0.7 mg/dL (ref 0.2–1.2)
Total Protein: 6.7 g/dL (ref 6.0–8.3)

## 2015-09-15 LAB — PROTIME-INR
INR: 1.1 ratio — AB (ref 0.8–1.0)
PROTHROMBIN TIME: 11.1 s (ref 9.6–13.1)

## 2015-09-15 LAB — TSH: TSH: 5.26 u[IU]/mL — AB (ref 0.35–4.50)

## 2015-09-15 NOTE — Assessment & Plan Note (Signed)
On Wellbutrin XL Haring loss correction should help

## 2015-09-15 NOTE — Assessment & Plan Note (Addendum)
Elevated TSH - increase Levothroid dose

## 2015-09-15 NOTE — Assessment & Plan Note (Signed)
Stable On Wellbutrin

## 2015-09-15 NOTE — Progress Notes (Signed)
Pre visit review using our clinic review tool, if applicable. No additional management support is needed unless otherwise documented below in the visit note. 

## 2015-09-15 NOTE — Assessment & Plan Note (Addendum)
The pt is medically clear for [facility] Ear Implant procedure by Dr Thornell Mule when his creatinine improves. EKG, CXR, labs are ordered Elevated creatinine - will need to increase fluid intake and re-check BMET in 2 weeks. Elevated TSH - increase Levothroid dose. Thank you!

## 2015-09-15 NOTE — Progress Notes (Signed)
Subjective:  Patient ID: Kenneth Williams, male    DOB: May 05, 1935  Age: 80 y.o. MRN: [id number]  CC: No chief complaint on file.   HPI Kenneth Williams presents for a surgical clearance  Reason - [facility] Ear Implant procedure Req by Dr Thornell Mule Hx: The pt has HTN, depression, hypothyroidism - all stable  Past Medical History:  Diagnosis Date  . BPH (benign prostatic hyperplasia)   . CAD (coronary artery disease)    cath 2011-mild non obstructive  . Elevated PSA    history of   . Gout    2009  . History of diverticulitis of colon   . History of pancreatitis   . History of TIAs   . Hyperlipidemia   . Hypertension   . Hypothyroidism   . Hypothyroidism   . Left ear hearing loss    wears right hearing aid  . Personal history of hyperthyroidism 2006   s/p 131I- Dr. Chalmers Cater, Hyperthyroid  . Renal insufficiency 2011  . TGA (transient global amnesia)    Past Surgical History:  Procedure Laterality Date  . CHOLECYSTECTOMY    . COLONOSCOPY    . STAPEDES SURGERY     LEFT EAR    reports that he has never smoked. He has never used smokeless tobacco. He reports that he does not drink alcohol or use drugs. family history includes Dementia in his mother; Heart disease in his sister; Hypertension in his other. Allergies  Allergen Reactions  . Coreg [Carvedilol] Diarrhea    Diarrhea and bradycardia  . Donepezil Hydrochloride Diarrhea    REACTION: diarrhea  . Hydrochlorothiazide Other (See Comments)    REACTION: Gout  . Razadyne [Galantamine Hydrobromide] Diarrhea    diarrhea      Outpatient Medications Prior to Visit  Medication Sig Dispense Refill  . alfuzosin (UROXATRAL) 10 MG 24 hr tablet Take 1 tablet (10 mg total) by mouth daily with breakfast. 30 tablet 11  . aspirin 81 MG tablet Take 162 mg by mouth daily.    Marland Kitchen buPROPion (WELLBUTRIN XL) 300 MG 24 hr tablet Take 1 tablet (300 mg total) by mouth daily. 30 tablet 5  . cholecalciferol (VITAMIN D) 1000 UNITS tablet Take 2,000  Units by mouth daily.      . clotrimazole-betamethasone (LOTRISONE) cream Apply 1 application topically 2 (two) times daily. 45 g 1  . colchicine 0.6 MG tablet Take 1 tablet (0.6 mg total) by mouth daily as needed. 90 tablet 3  . Cyanocobalamin (VITAMIN B 12 PO) Take 1 tablet by mouth daily.      Marland Kitchen labetalol (NORMODYNE) 200 MG tablet Take 2 tablets (400 mg total) by mouth 2 (two) times daily. 360 tablet 3  . levothyroxine (SYNTHROID, LEVOTHROID) 50 MCG tablet Take 1 tablet (50 mcg total) by mouth daily before breakfast. 90 tablet 3  . loperamide (IMODIUM A-D) 2 MG tablet Take 1 tablet (2 mg total) by mouth 4 (four) times daily as needed for diarrhea or loose stools. 30 tablet 0  . losartan (COZAAR) 100 MG tablet TAKE 1 TABLET (100 MG TOTAL) BY MOUTH DAILY. 90 tablet 3  . metroNIDAZOLE (FLAGYL) 500 MG tablet Take 1 tablet (500 mg total) by mouth 3 (three) times daily. 30 tablet 0  . simvastatin (ZOCOR) 40 MG tablet Take 1 tablet (40 mg total) by mouth at bedtime. 90 tablet 3  . spironolactone (ALDACTONE) 25 MG tablet Take 1 tablet (25 mg total) by mouth daily. 30 tablet 11   No facility-administered medications  prior to visit.     ROS Review of Systems  Constitutional: Positive for fatigue. Negative for appetite change and unexpected weight change.  HENT: Positive for hearing loss. Negative for congestion, nosebleeds, sneezing, sore throat and trouble swallowing.   Eyes: Negative for itching and visual disturbance.  Respiratory: Negative for cough.   Cardiovascular: Negative for chest pain, palpitations and leg swelling.  Gastrointestinal: Negative for abdominal distention, blood in stool, diarrhea and nausea.  Genitourinary: Negative for frequency and hematuria.  Musculoskeletal: Negative for back pain, gait problem, joint swelling and neck pain.  Skin: Negative for rash.  Neurological: Positive for dizziness. Negative for tremors, speech difficulty and weakness.  Psychiatric/Behavioral:  Positive for decreased concentration, dysphoric mood and sleep disturbance. Negative for agitation and suicidal ideas. The patient is not nervous/anxious.     Objective:  BP 120/78   Pulse 66   Wt 195 lb (88.5 kg)   SpO2 97%   BMI 25.73 kg/m   BP Readings from Last 3 Encounters:  09/15/15 120/78  06/18/15 140/84  06/02/15 140/80    Wt Readings from Last 3 Encounters:  09/15/15 195 lb (88.5 kg)  06/18/15 193 lb (87.5 kg)  06/02/15 194 lb 4 oz (88.1 kg)    Physical Exam  Constitutional: He is oriented to person, place, and time. He appears well-developed. No distress.  NAD  HENT:  Mouth/Throat: Oropharynx is clear and moist.  Eyes: Conjunctivae are normal. Pupils are equal, round, and reactive to light.  Neck: Normal range of motion. No JVD present. No thyromegaly present.  Cardiovascular: Normal rate, regular rhythm, normal heart sounds and intact distal pulses.  Exam reveals no gallop and no friction rub.   No murmur heard. Pulmonary/Chest: Effort normal and breath sounds normal. No respiratory distress. He has no wheezes. He has no rales. He exhibits no tenderness.  Abdominal: Soft. Bowel sounds are normal. He exhibits no distension and no mass. There is no tenderness. There is no rebound and no guarding.  Musculoskeletal: Normal range of motion. He exhibits no edema or tenderness.  Lymphadenopathy:    He has no cervical adenopathy.  Neurological: He is alert and oriented to person, place, and time. He has normal reflexes. No cranial nerve deficit. He exhibits normal muscle tone. He displays a negative Romberg sign. Coordination and gait normal.  Skin: Skin is warm and dry. No rash noted.  Psychiatric: He has a normal mood and affect. His behavior is normal. Judgment and thought content normal.  hard hearing  Procedure: EKG Indication: pre-op exam Impression: NSR. No new/acute changes.   Lab Results  Component Value Date   WBC 10.0 03/25/2014   HGB 14.9 03/25/2014     HCT 44.3 03/25/2014   PLT 281.0 03/25/2014   GLUCOSE 84 03/25/2014   CHOL 193 03/25/2014   TRIG 314.0 (H) 03/25/2014   HDL 32.80 (L) 03/25/2014   LDLDIRECT 123.0 03/25/2014   LDLCALC 144 (H) 09/12/2013   ALT 24 03/25/2014   AST 16 03/25/2014   NA 135 03/25/2014   K 4.8 03/25/2014   CL 105 03/25/2014   CREATININE 1.45 03/25/2014   BUN 25 (H) 03/25/2014   CO2 24 03/25/2014   TSH 2.44 03/25/2014   PSA 2.15 03/25/2014   INR 1.0 ratio 02/25/2009    No results found.  Assessment & Plan:   There are no diagnoses linked to this encounter. I am having Mr. Seabolt maintain his Cyanocobalamin (VITAMIN B 12 PO), cholecalciferol, colchicine, aspirin, clotrimazole-betamethasone, labetalol, levothyroxine, simvastatin, losartan,  loperamide, buPROPion, metroNIDAZOLE, spironolactone, and alfuzosin.  No orders of the defined types were placed in this encounter.    Follow-up: No Follow-up on file.  Walker Kehr, MD

## 2015-09-16 ENCOUNTER — Telehealth: Payer: Self-pay | Admitting: Internal Medicine

## 2015-09-16 DIAGNOSIS — R7989 Other specified abnormal findings of blood chemistry: Secondary | ICD-10-CM | POA: Insufficient documentation

## 2015-09-16 MED ORDER — LEVOTHYROXINE SODIUM 88 MCG PO TABS: 88.0000 ug | ORAL_TABLET | Freq: Every day | ORAL | 3 refills | Status: DC

## 2015-09-16 NOTE — Telephone Encounter (Signed)
Left mess for patient to call back.  

## 2015-09-16 NOTE — Telephone Encounter (Signed)
Kenneth Williams,  Please inform patient that all labs are normal except for abn creatinine and abn TSH. CXR was ok. Please increase Levothroid dose Drink more water. Check BMET in 2 wks Thx

## 2015-09-16 NOTE — Addendum Note (Signed)
Addended by: Cassandria Anger on: 09/16/2015 09:22 PM   Modules accepted: Orders

## 2015-09-16 NOTE — Assessment & Plan Note (Signed)
Recheck in 2 weeks 

## 2015-09-17 NOTE — Telephone Encounter (Signed)
Left detailed mess informing pt of below.  

## 2015-09-19 ENCOUNTER — Ambulatory Visit: Payer: Medicare Other | Admitting: Internal Medicine

## 2015-09-29 ENCOUNTER — Telehealth: Payer: Self-pay | Admitting: Emergency Medicine

## 2015-09-29 NOTE — Telephone Encounter (Signed)
Pt informed    Note    Erline Levine,  Please inform patient that all labs are normal except for abn creatinine and abn TSH. CXR was ok. Please increase Levothroid dose Drink more water. Check BMET in 2 wks Thx

## 2015-09-29 NOTE — Telephone Encounter (Signed)
Pt stated he was returning a phone call from you and wanted you to call him back thanks.

## 2015-10-04 ENCOUNTER — Other Ambulatory Visit: Payer: Self-pay | Admitting: Internal Medicine

## 2015-10-08 DIAGNOSIS — H906 Mixed conductive and sensorineural hearing loss, bilateral: Secondary | ICD-10-CM | POA: Diagnosis not present

## 2015-10-08 DIAGNOSIS — H8081 Other otosclerosis, right ear: Secondary | ICD-10-CM | POA: Diagnosis not present

## 2015-10-09 ENCOUNTER — Other Ambulatory Visit (INDEPENDENT_AMBULATORY_CARE_PROVIDER_SITE_OTHER): Payer: Medicare Other

## 2015-10-09 DIAGNOSIS — R7989 Other specified abnormal findings of blood chemistry: Secondary | ICD-10-CM

## 2015-10-09 DIAGNOSIS — E039 Hypothyroidism, unspecified: Secondary | ICD-10-CM

## 2015-10-09 DIAGNOSIS — I10 Essential (primary) hypertension: Secondary | ICD-10-CM | POA: Diagnosis not present

## 2015-10-09 DIAGNOSIS — R748 Abnormal levels of other serum enzymes: Secondary | ICD-10-CM | POA: Diagnosis not present

## 2015-10-09 LAB — BASIC METABOLIC PANEL
BUN: 24 mg/dL — ABNORMAL HIGH (ref 6–23)
CHLORIDE: 108 meq/L (ref 96–112)
CO2: 25 meq/L (ref 19–32)
Calcium: 9.7 mg/dL (ref 8.4–10.5)
Creatinine, Ser: 2.22 mg/dL — ABNORMAL HIGH (ref 0.40–1.50)
GFR: 30.59 mL/min — ABNORMAL LOW (ref >60.00)
Glucose, Bld: 85 mg/dL (ref 70–99)
POTASSIUM: 4.6 meq/L (ref 3.5–5.1)
Sodium: 140 mEq/L (ref 135–145)

## 2015-10-09 LAB — TSH: TSH: 2.44 u[IU]/mL (ref 0.35–4.50)

## 2015-10-13 DIAGNOSIS — H906 Mixed conductive and sensorineural hearing loss, bilateral: Secondary | ICD-10-CM | POA: Diagnosis not present

## 2015-10-13 DIAGNOSIS — H8091 Unspecified otosclerosis, right ear: Secondary | ICD-10-CM | POA: Diagnosis not present

## 2015-10-13 DIAGNOSIS — H8081 Other otosclerosis, right ear: Secondary | ICD-10-CM | POA: Diagnosis not present

## 2015-12-22 ENCOUNTER — Other Ambulatory Visit (INDEPENDENT_AMBULATORY_CARE_PROVIDER_SITE_OTHER): Payer: Medicare Other

## 2015-12-22 ENCOUNTER — Telehealth: Payer: Self-pay | Admitting: Emergency Medicine

## 2015-12-22 ENCOUNTER — Encounter: Payer: Self-pay | Admitting: Internal Medicine

## 2015-12-22 ENCOUNTER — Ambulatory Visit (INDEPENDENT_AMBULATORY_CARE_PROVIDER_SITE_OTHER): Payer: Medicare Other | Admitting: Internal Medicine

## 2015-12-22 VITALS — BP 148/90 | HR 68 | Temp 97.8°F | Wt 200.0 lb

## 2015-12-22 DIAGNOSIS — Z23 Encounter for immunization: Secondary | ICD-10-CM

## 2015-12-22 DIAGNOSIS — R7989 Other specified abnormal findings of blood chemistry: Secondary | ICD-10-CM | POA: Diagnosis not present

## 2015-12-22 DIAGNOSIS — R413 Other amnesia: Secondary | ICD-10-CM | POA: Diagnosis not present

## 2015-12-22 DIAGNOSIS — N401 Enlarged prostate with lower urinary tract symptoms: Secondary | ICD-10-CM

## 2015-12-22 DIAGNOSIS — R350 Frequency of micturition: Secondary | ICD-10-CM

## 2015-12-22 DIAGNOSIS — I10 Essential (primary) hypertension: Secondary | ICD-10-CM | POA: Diagnosis not present

## 2015-12-22 DIAGNOSIS — IMO0002 Reserved for concepts with insufficient information to code with codable children: Secondary | ICD-10-CM

## 2015-12-22 DIAGNOSIS — R634 Abnormal weight loss: Secondary | ICD-10-CM | POA: Diagnosis not present

## 2015-12-22 DIAGNOSIS — R453 Demoralization and apathy: Secondary | ICD-10-CM

## 2015-12-22 DIAGNOSIS — F332 Major depressive disorder, recurrent severe without psychotic features: Secondary | ICD-10-CM

## 2015-12-22 LAB — BASIC METABOLIC PANEL
BUN: 28 mg/dL — ABNORMAL HIGH (ref 6–23)
CHLORIDE: 106 meq/L (ref 96–112)
CO2: 25 meq/L (ref 19–32)
Calcium: 9.6 mg/dL (ref 8.4–10.5)
Creatinine, Ser: 2.07 mg/dL — ABNORMAL HIGH (ref 0.40–1.50)
GFR: 33.14 mL/min — ABNORMAL LOW (ref >60.00)
Glucose, Bld: 102 mg/dL — ABNORMAL HIGH (ref 70–99)
Potassium: 4.8 mEq/L (ref 3.5–5.1)
SODIUM: 138 meq/L (ref 135–145)

## 2015-12-22 MED ORDER — VORTIOXETINE HBR 5 MG PO TABS: ORAL_TABLET | ORAL | 5 refills | Status: DC

## 2015-12-22 NOTE — Assessment & Plan Note (Signed)
Losartan, Labetalol, spironolctone

## 2015-12-22 NOTE — Telephone Encounter (Signed)
PCP informed prior to pt's 3 mo ROV today. Closing phone note. See OV note with PCP.

## 2015-12-22 NOTE — Progress Notes (Signed)
Subjective:  Patient ID: Kenneth Williams, male    DOB: 02-25-1935  Age: 80 y.o. MRN: [id number]  CC: No chief complaint on file.   HPI Kenneth Williams presents for depression, HTN, apathy, BPH - worse (not taking Uroxatral) f/u. Apathy is worse per dtr.  Outpatient Medications Prior to Visit  Medication Sig Dispense Refill  . alfuzosin (UROXATRAL) 10 MG 24 hr tablet Take 1 tablet (10 mg total) by mouth daily with breakfast. 30 tablet 11  . aspirin 81 MG tablet Take 162 mg by mouth daily.    Marland Kitchen buPROPion (WELLBUTRIN XL) 300 MG 24 hr tablet TAKE 1 TABLET BY MOUTH DAILY 30 tablet 5  . cholecalciferol (VITAMIN D) 1000 UNITS tablet Take 2,000 Units by mouth daily.      . clotrimazole-betamethasone (LOTRISONE) cream Apply 1 application topically 2 (two) times daily. 45 g 1  . colchicine 0.6 MG tablet Take 1 tablet (0.6 mg total) by mouth daily as needed. 90 tablet 3  . Cyanocobalamin (VITAMIN B 12 PO) Take 1 tablet by mouth daily.      Marland Kitchen labetalol (NORMODYNE) 200 MG tablet Take 2 tablets (400 mg total) by mouth 2 (two) times daily. 360 tablet 3  . levothyroxine (SYNTHROID, LEVOTHROID) 88 MCG tablet Take 1 tablet (88 mcg total) by mouth daily. 30 tablet 3  . loperamide (IMODIUM A-D) 2 MG tablet Take 1 tablet (2 mg total) by mouth 4 (four) times daily as needed for diarrhea or loose stools. 30 tablet 0  . metroNIDAZOLE (FLAGYL) 500 MG tablet Take 1 tablet (500 mg total) by mouth 3 (three) times daily. 30 tablet 0  . simvastatin (ZOCOR) 40 MG tablet Take 1 tablet (40 mg total) by mouth at bedtime. 90 tablet 3  . spironolactone (ALDACTONE) 25 MG tablet Take 1 tablet (25 mg total) by mouth daily. 30 tablet 11   No facility-administered medications prior to visit.     ROS Review of Systems  Constitutional: Negative for appetite change, fatigue and unexpected weight change.  HENT: Negative for congestion, nosebleeds, sneezing, sore throat and trouble swallowing.   Eyes: Negative for itching and  visual disturbance.  Respiratory: Negative for cough.   Cardiovascular: Negative for chest pain, palpitations and leg swelling.  Gastrointestinal: Negative for abdominal distention, blood in stool, diarrhea and nausea.  Genitourinary: Negative for frequency and hematuria.  Musculoskeletal: Negative for back pain, gait problem, joint swelling and neck pain.  Skin: Negative for rash.  Neurological: Negative for dizziness, tremors, speech difficulty and weakness.  Psychiatric/Behavioral: Positive for behavioral problems, decreased concentration and dysphoric mood. Negative for agitation, sleep disturbance and suicidal ideas. The patient is nervous/anxious.     Objective:  Wt 200 lb (90.7 kg)   BMI 26.39 kg/m   BP Readings from Last 3 Encounters:  09/15/15 120/78  06/18/15 140/84  06/02/15 140/80    Wt Readings from Last 3 Encounters:  12/22/15 200 lb (90.7 kg)  09/15/15 195 lb (88.5 kg)  06/18/15 193 lb (87.5 kg)    Physical Exam  Constitutional: He is oriented to person, place, and time. He appears well-developed. No distress.  NAD  HENT:  Mouth/Throat: Oropharynx is clear and moist.  Eyes: Conjunctivae are normal. Pupils are equal, round, and reactive to light.  Neck: Normal range of motion. No JVD present. No thyromegaly present.  Cardiovascular: Normal rate, regular rhythm, normal heart sounds and intact distal pulses.  Exam reveals no gallop and no friction rub.   No murmur heard. Pulmonary/Chest:  Effort normal and breath sounds normal. No respiratory distress. He has no wheezes. He has no rales. He exhibits no tenderness.  Abdominal: Soft. Bowel sounds are normal. He exhibits no distension and no mass. There is no tenderness. There is no rebound and no guarding.  Musculoskeletal: Normal range of motion. He exhibits no edema or tenderness.  Lymphadenopathy:    He has no cervical adenopathy.  Neurological: He is alert and oriented to person, place, and time. He has normal  reflexes. No cranial nerve deficit. He exhibits normal muscle tone. He displays a negative Romberg sign. Coordination and gait normal.  Skin: Skin is warm and dry. No rash noted.  Psychiatric: He has a normal mood and affect. His behavior is normal. Judgment and thought content normal.    Lab Results  Component Value Date   WBC 9.6 09/15/2015   HGB 14.4 09/15/2015   HCT 43.0 09/15/2015   PLT 268.0 09/15/2015   GLUCOSE 85 10/09/2015   CHOL 193 03/25/2014   TRIG 314.0 (H) 03/25/2014   HDL 32.80 (L) 03/25/2014   LDLDIRECT 123.0 03/25/2014   LDLCALC 144 (H) 09/12/2013   ALT 19 09/15/2015   AST 13 09/15/2015   NA 140 10/09/2015   K 4.6 10/09/2015   CL 108 10/09/2015   CREATININE 2.22 (H) 10/09/2015   BUN 24 (H) 10/09/2015   CO2 25 10/09/2015   TSH 2.44 10/09/2015   PSA 2.15 03/25/2014   INR 1.1 (H) 09/15/2015    Dg Chest 2 View  Result Date: 09/15/2015 CLINICAL DATA:  Shortness of breath. EXAM: CHEST  2 VIEW COMPARISON:  No recent. FINDINGS: Mediastinum and hilar structures normal. Lungs are clear. Heart size normal. Small bilateral pleural effusions. No pneumothorax . IMPRESSION: Tiny bilateral pleural effusions cannot be excluded. Exam is otherwise unremarkable. Electronically Signed   By: Marcello Moores  Register   On: 09/15/2015 12:54   Assessment & Plan:   There are no diagnoses linked to this encounter. I am having Mr. Bumpass maintain his Cyanocobalamin (VITAMIN B 12 PO), cholecalciferol, colchicine, aspirin, clotrimazole-betamethasone, labetalol, simvastatin, loperamide, metroNIDAZOLE, spironolactone, alfuzosin, levothyroxine, and buPROPion.  No orders of the defined types were placed in this encounter.    Follow-up: No Follow-up on file.  Walker Kehr, MD

## 2015-12-22 NOTE — Assessment & Plan Note (Signed)
Abd [place] Labs Losartan was stopped

## 2015-12-22 NOTE — Assessment & Plan Note (Signed)
Wt Readings from Last 3 Encounters:  12/22/15 200 lb (90.7 kg)  09/15/15 195 lb (88.5 kg)  06/18/15 193 lb (87.5 kg)

## 2015-12-22 NOTE — Progress Notes (Signed)
Pre visit review using our clinic review tool, if applicable. No additional management support is needed unless otherwise documented below in the visit note. 

## 2015-12-22 NOTE — Assessment & Plan Note (Signed)
Discussed On Wellbutrin Pt declined other meds

## 2015-12-22 NOTE — Assessment & Plan Note (Signed)
Re-start Uroxatrol

## 2015-12-22 NOTE — Assessment & Plan Note (Signed)
Pt needs to get hearing aids - he is working on it

## 2015-12-22 NOTE — Assessment & Plan Note (Signed)
Discussed On Wellbutrin Pt declined other meds before. We will try to add Trintelix 5 mg/d

## 2015-12-22 NOTE — Telephone Encounter (Signed)
Pts daughter called and is not going to be able to make it to her dads appointment today. She wanted to let you know her dad is very depressed. He is not wanting to be around people, sleeps and doesn't have much to say. She is very concerned and wants to know if there is a medication he can try. If you need to talk with her you can give her a call back. Thanks.

## 2016-01-11 ENCOUNTER — Other Ambulatory Visit: Payer: Self-pay | Admitting: Internal Medicine

## 2016-01-20 ENCOUNTER — Ambulatory Visit
Admission: RE | Admit: 2016-01-20 | Discharge: 2016-01-20 | Disposition: A | Discharge status: Home / Self Care | Payer: Medicare Other | Source: Ambulatory Visit | Attending: Internal Medicine | Admitting: Internal Medicine

## 2016-01-20 DIAGNOSIS — K760 Fatty (change of) liver, not elsewhere classified: Secondary | ICD-10-CM | POA: Diagnosis not present

## 2016-01-29 DIAGNOSIS — H906 Mixed conductive and sensorineural hearing loss, bilateral: Secondary | ICD-10-CM | POA: Diagnosis not present

## 2016-01-29 DIAGNOSIS — H8081 Other otosclerosis, right ear: Secondary | ICD-10-CM | POA: Diagnosis not present

## 2016-02-06 ENCOUNTER — Ambulatory Visit (INDEPENDENT_AMBULATORY_CARE_PROVIDER_SITE_OTHER): Payer: Medicare Other | Admitting: Internal Medicine

## 2016-02-06 ENCOUNTER — Ambulatory Visit (INDEPENDENT_AMBULATORY_CARE_PROVIDER_SITE_OTHER)
Admission: RE | Admit: 2016-02-06 | Discharge: 2016-02-06 | Disposition: A | Discharge status: Home / Self Care | Payer: Medicare Other | Source: Ambulatory Visit | Attending: Internal Medicine | Admitting: Internal Medicine

## 2016-02-06 VITALS — BP 140/80 | HR 79 | Wt 200.0 lb

## 2016-02-06 DIAGNOSIS — R197 Diarrhea, unspecified: Secondary | ICD-10-CM

## 2016-02-06 DIAGNOSIS — N401 Enlarged prostate with lower urinary tract symptoms: Secondary | ICD-10-CM | POA: Diagnosis not present

## 2016-02-06 DIAGNOSIS — R453 Demoralization and apathy: Secondary | ICD-10-CM

## 2016-02-06 DIAGNOSIS — J90 Pleural effusion, not elsewhere classified: Secondary | ICD-10-CM | POA: Diagnosis not present

## 2016-02-06 DIAGNOSIS — K760 Fatty (change of) liver, not elsewhere classified: Secondary | ICD-10-CM

## 2016-02-06 DIAGNOSIS — R350 Frequency of micturition: Secondary | ICD-10-CM

## 2016-02-06 DIAGNOSIS — R0609 Other forms of dyspnea: Secondary | ICD-10-CM | POA: Diagnosis not present

## 2016-02-06 DIAGNOSIS — R7989 Other specified abnormal findings of blood chemistry: Secondary | ICD-10-CM

## 2016-02-06 DIAGNOSIS — I10 Essential (primary) hypertension: Secondary | ICD-10-CM

## 2016-02-06 DIAGNOSIS — K909 Intestinal malabsorption, unspecified: Secondary | ICD-10-CM

## 2016-02-06 DIAGNOSIS — F332 Major depressive disorder, recurrent severe without psychotic features: Secondary | ICD-10-CM

## 2016-02-06 NOTE — Patient Instructions (Signed)
Fatty Liver Introduction Fatty liver, also called hepatic steatosis or steatohepatitis, is a condition in which too much fat has built up in your liver cells. The liver removes harmful substances from your bloodstream. It produces fluids your body needs. It also helps your body use and store energy from the food you eat. In many cases, fatty liver does not cause symptoms or problems. It is often diagnosed when tests are being done for other reasons. However, over time, fatty liver can cause inflammation that may lead to more serious liver problems, such as scarring of the liver (cirrhosis). What are the causes? Causes of fatty liver may include:  Drinking too much alcohol.  Poor nutrition.  Obesity.  Cushing syndrome.  Diabetes.  Hyperlipidemia.  Pregnancy.  Certain drugs.  Poisons.  Some viral infections. What increases the risk? You may be more likely to develop fatty liver if you:  Abuse alcohol.  Are pregnant.  Are overweight.  Have diabetes.  Have hepatitis.  Have a high triglyceride level. What are the signs or symptoms? Fatty liver often does not cause any symptoms. In cases where symptoms develop, they can include:  Fatigue.  Weakness.  Weight loss.  Confusion.  Abdominal pain.  Yellowing of your skin and the white parts of your eyes (jaundice).  Nausea and vomiting. How is this diagnosed? Fatty liver may be diagnosed by:  Physical exam and medical history.  Blood tests.  Imaging tests, such as an ultrasound, CT scan, or MRI.  Liver biopsy. A small sample of liver tissue is removed using a needle. The sample is then looked at under a microscope. How is this treated? Fatty liver is often caused by other health conditions. Treatment for fatty liver may involve medicines and lifestyle changes to manage conditions such as:  Alcoholism.  High cholesterol.  Diabetes.  Being overweight or obese. Follow these instructions at home:  Eat a  healthy diet as directed by your health care provider.  Exercise regularly. This can help you lose weight and control your cholesterol and diabetes. Talk to your health care provider about an exercise plan and which activities are best for you.  Do not drink alcohol.  Take medicines only as directed by your health care provider. Contact a health care provider if: You have difficulty controlling your:  Blood sugar.  Cholesterol.  Alcohol consumption. Get help right away if:  You have abdominal pain.  You have jaundice.  You have nausea and vomiting. This information is not intended to replace advice given to you by your health care provider. Make sure you discuss any questions you have with your health care provider. Document Released: 03/26/2005 Document Revised: 07/17/2015 Document Reviewed: 06/20/2013  2017 Elsevier

## 2016-02-06 NOTE — Assessment & Plan Note (Signed)
BP control

## 2016-02-06 NOTE — Assessment & Plan Note (Addendum)
2017 [place] mild Loose 10 lbs No sodas Eat better Simvastatin

## 2016-02-06 NOTE — Progress Notes (Signed)
Subjective:  Patient ID: Kenneth Williams, male    DOB: September 13, 1935  Age: 80 y.o. MRN: [id number]  CC: No chief complaint on file.   HPI Kenneth Williams presents for HTN, CRI, fatty liver f/u  Outpatient Medications Prior to Visit  Medication Sig Dispense Refill  . alfuzosin (UROXATRAL) 10 MG 24 hr tablet Take 1 tablet (10 mg total) by mouth daily with breakfast. 30 tablet 11  . aspirin 81 MG tablet Take 162 mg by mouth daily.    Marland Kitchen buPROPion (WELLBUTRIN XL) 300 MG 24 hr tablet TAKE 1 TABLET BY MOUTH DAILY 30 tablet 5  . cholecalciferol (VITAMIN D) 1000 UNITS tablet Take 2,000 Units by mouth daily.      . clotrimazole-betamethasone (LOTRISONE) cream Apply 1 application topically 2 (two) times daily. 45 g 1  . colchicine 0.6 MG tablet Take 1 tablet (0.6 mg total) by mouth daily as needed. 90 tablet 3  . Cyanocobalamin (VITAMIN B 12 PO) Take 1 tablet by mouth daily.      Marland Kitchen labetalol (NORMODYNE) 200 MG tablet Take 2 tablets (400 mg total) by mouth 2 (two) times daily. 360 tablet 3  . levothyroxine (SYNTHROID, LEVOTHROID) 88 MCG tablet TAKE 1 TABLET (88 MCG TOTAL) BY MOUTH DAILY. 30 tablet 3  . loperamide (IMODIUM A-D) 2 MG tablet Take 1 tablet (2 mg total) by mouth 4 (four) times daily as needed for diarrhea or loose stools. 30 tablet 0  . simvastatin (ZOCOR) 40 MG tablet Take 1 tablet (40 mg total) by mouth at bedtime. 90 tablet 3  . spironolactone (ALDACTONE) 25 MG tablet Take 1 tablet (25 mg total) by mouth daily. 30 tablet 11  . vortioxetine HBr (TRINTELLIX) 5 MG TABS 1 po qd 30 tablet 5   No facility-administered medications prior to visit.     ROS Review of Systems  Constitutional: Negative for appetite change, fatigue and unexpected weight change.  HENT: Negative for congestion, nosebleeds, sneezing, sore throat and trouble swallowing.   Eyes: Negative for itching and visual disturbance.  Respiratory: Negative for cough.   Cardiovascular: Negative for chest pain, palpitations and  leg swelling.  Gastrointestinal: Negative for abdominal distention, blood in stool, diarrhea and nausea.  Genitourinary: Negative for frequency and hematuria.  Musculoskeletal: Negative for back pain, gait problem, joint swelling and neck pain.  Skin: Negative for rash.  Neurological: Negative for dizziness, tremors, speech difficulty and weakness.  Psychiatric/Behavioral: Positive for decreased concentration. Negative for agitation, dysphoric mood and sleep disturbance. The patient is not nervous/anxious.     Objective:  BP 140/80   Pulse 79   Wt 200 lb (90.7 kg)   SpO2 96%   BMI 26.39 kg/m   BP Readings from Last 3 Encounters:  02/06/16 140/80  12/22/15 (!) 148/90  09/15/15 120/78    Wt Readings from Last 3 Encounters:  02/06/16 200 lb (90.7 kg)  12/22/15 200 lb (90.7 kg)  09/15/15 195 lb (88.5 kg)    Physical Exam  Constitutional: He is oriented to person, place, and time. He appears well-developed. No distress.  NAD  HENT:  Mouth/Throat: Oropharynx is clear and moist.  Eyes: Conjunctivae are normal. Pupils are equal, round, and reactive to light.  Neck: Normal range of motion. No JVD present. No thyromegaly present.  Cardiovascular: Normal rate, regular rhythm, normal heart sounds and intact distal pulses.  Exam reveals no gallop and no friction rub.   No murmur heard. Pulmonary/Chest: Effort normal and breath sounds normal. No respiratory distress.  He has no wheezes. He has no rales. He exhibits no tenderness.  Abdominal: Soft. Bowel sounds are normal. He exhibits no distension and no mass. There is no tenderness. There is no rebound and no guarding.  Musculoskeletal: Normal range of motion. He exhibits no edema or tenderness.  Lymphadenopathy:    He has no cervical adenopathy.  Neurological: He is alert and oriented to person, place, and time. He has normal reflexes. No cranial nerve deficit. He exhibits normal muscle tone. He displays a negative Romberg sign.  Coordination and gait normal.  Skin: Skin is warm and dry. No rash noted.  Psychiatric: He has a normal mood and affect. His behavior is normal. Judgment and thought content normal.    Lab Results  Component Value Date   WBC 9.6 09/15/2015   HGB 14.4 09/15/2015   HCT 43.0 09/15/2015   PLT 268.0 09/15/2015   GLUCOSE 102 (H) 12/22/2015   CHOL 193 03/25/2014   TRIG 314.0 (H) 03/25/2014   HDL 32.80 (L) 03/25/2014   LDLDIRECT 123.0 03/25/2014   LDLCALC 144 (H) 09/12/2013   ALT 19 09/15/2015   AST 13 09/15/2015   NA 138 12/22/2015   K 4.8 12/22/2015   CL 106 12/22/2015   CREATININE 2.07 (H) 12/22/2015   BUN 28 (H) 12/22/2015   CO2 25 12/22/2015   TSH 2.44 10/09/2015   PSA 2.15 03/25/2014   INR 1.1 (H) 09/15/2015    US Abdomen Complete  Result Date: 01/20/2016 CLINICAL DATA:  Elevated creatinine, status post cholecystectomy EXAM: ABDOMEN ULTRASOUND COMPLETE COMPARISON:  04/04/2007 FINDINGS: Gallbladder: Prior cholecystectomy Common bile duct: Diameter: Normal caliber, 6 mm Liver: Mildly increased echotexture throughout the liver suggesting fatty infiltration. 1.3 cm cyst in the right lobe. No biliary ductal dilatation. IVC: No abnormality visualized. Pancreas: Visualized portion unremarkable. Spleen: Size and appearance within normal limits. Right Kidney: Length: 9.9 cm. Cortical thinning with increased echotexture. Small cyst in the midpole measuring 12 mm. No hydronephrosis. Left Kidney: Length: 10.8 cm. Cortical thinning with increased echotexture. No hydronephrosis. 2 benign-appearing cysts, the largest 4.3 cm. Abdominal aorta: No aneurysm visualized. Other findings: None. IMPRESSION: Cortical thinning and increased echotexture in the kidneys bilaterally compatible with chronic medical renal disease. No hydronephrosis. Mild fatty infiltration of the liver. Prior cholecystectomy. Electronically Signed   By: Rolm Baptise M.D.   On: 01/20/2016 10:15    Assessment & Plan:   There are  no diagnoses linked to this encounter. I am having Kenneth Williams maintain his Cyanocobalamin (VITAMIN B 12 PO), cholecalciferol, colchicine, aspirin, clotrimazole-betamethasone, labetalol, simvastatin, loperamide, spironolactone, alfuzosin, buPROPion, vortioxetine HBr, and levothyroxine.  No orders of the defined types were placed in this encounter.    Follow-up: No Follow-up on file.  Walker Kehr, MD

## 2016-02-06 NOTE — Assessment & Plan Note (Signed)
Better  

## 2016-02-06 NOTE — Assessment & Plan Note (Signed)
Wellbutrin Trentellix too $$$

## 2016-02-06 NOTE — Assessment & Plan Note (Signed)
Wt Readings from Last 3 Encounters:  02/06/16 200 lb (90.7 kg)  12/22/15 200 lb (90.7 kg)  09/15/15 195 lb (88.5 kg)

## 2016-02-06 NOTE — Assessment & Plan Note (Signed)
4/17 Uroxatral No Hydronephrosis on [place]

## 2016-02-10 ENCOUNTER — Other Ambulatory Visit: Payer: Self-pay | Admitting: Internal Medicine

## 2016-03-05 ENCOUNTER — Other Ambulatory Visit: Payer: Self-pay | Admitting: Internal Medicine

## 2016-03-29 ENCOUNTER — Other Ambulatory Visit: Payer: Self-pay | Admitting: Internal Medicine

## 2016-03-31 ENCOUNTER — Telehealth: Payer: Self-pay | Admitting: Internal Medicine

## 2016-03-31 NOTE — Telephone Encounter (Signed)
Called patient to schedule awv. Left msg for patient to call office to schedule appt.  °

## 2016-04-26 DIAGNOSIS — H8081 Other otosclerosis, right ear: Secondary | ICD-10-CM | POA: Diagnosis not present

## 2016-04-26 DIAGNOSIS — S0000XD Unspecified superficial injury of scalp, subsequent encounter: Secondary | ICD-10-CM | POA: Diagnosis not present

## 2016-04-26 DIAGNOSIS — H906 Mixed conductive and sensorineural hearing loss, bilateral: Secondary | ICD-10-CM | POA: Diagnosis not present

## 2016-04-30 ENCOUNTER — Other Ambulatory Visit: Payer: Self-pay | Admitting: Internal Medicine

## 2016-05-17 DIAGNOSIS — H906 Mixed conductive and sensorineural hearing loss, bilateral: Secondary | ICD-10-CM | POA: Diagnosis not present

## 2016-05-17 DIAGNOSIS — H8081 Other otosclerosis, right ear: Secondary | ICD-10-CM | POA: Diagnosis not present

## 2016-05-17 DIAGNOSIS — S0000XD Unspecified superficial injury of scalp, subsequent encounter: Secondary | ICD-10-CM | POA: Diagnosis not present

## 2016-05-18 ENCOUNTER — Other Ambulatory Visit: Payer: Self-pay | Admitting: Internal Medicine

## 2016-05-18 ENCOUNTER — Ambulatory Visit (INDEPENDENT_AMBULATORY_CARE_PROVIDER_SITE_OTHER): Payer: PPO | Admitting: Internal Medicine

## 2016-05-18 ENCOUNTER — Encounter: Payer: Self-pay | Admitting: Internal Medicine

## 2016-05-18 ENCOUNTER — Other Ambulatory Visit (INDEPENDENT_AMBULATORY_CARE_PROVIDER_SITE_OTHER): Payer: PPO

## 2016-05-18 VITALS — BP 114/74 | HR 57 | Temp 97.5°F | Resp 16 | Ht 73.0 in | Wt 201.5 lb

## 2016-05-18 DIAGNOSIS — R350 Frequency of micturition: Secondary | ICD-10-CM | POA: Diagnosis not present

## 2016-05-18 DIAGNOSIS — E034 Atrophy of thyroid (acquired): Secondary | ICD-10-CM

## 2016-05-18 DIAGNOSIS — N62 Hypertrophy of breast: Secondary | ICD-10-CM

## 2016-05-18 DIAGNOSIS — R972 Elevated prostate specific antigen [PSA]: Secondary | ICD-10-CM | POA: Diagnosis not present

## 2016-05-18 DIAGNOSIS — N401 Enlarged prostate with lower urinary tract symptoms: Secondary | ICD-10-CM | POA: Diagnosis not present

## 2016-05-18 DIAGNOSIS — F332 Major depressive disorder, recurrent severe without psychotic features: Secondary | ICD-10-CM | POA: Diagnosis not present

## 2016-05-18 DIAGNOSIS — R490 Dysphonia: Secondary | ICD-10-CM | POA: Insufficient documentation

## 2016-05-18 DIAGNOSIS — R413 Other amnesia: Secondary | ICD-10-CM | POA: Diagnosis not present

## 2016-05-18 LAB — HEPATIC FUNCTION PANEL
ALK PHOS: 57 U/L (ref 39–117)
ALT: 23 U/L (ref 0–53)
AST: 17 U/L (ref 0–37)
Albumin: 4.2 g/dL (ref 3.5–5.2)
BILIRUBIN DIRECT: 0.1 mg/dL (ref 0.0–0.3)
TOTAL PROTEIN: 6.4 g/dL (ref 6.0–8.3)
Total Bilirubin: 0.5 mg/dL (ref 0.2–1.2)

## 2016-05-18 LAB — BASIC METABOLIC PANEL
BUN: 27 mg/dL — AB (ref 6–23)
CHLORIDE: 107 meq/L (ref 96–112)
CO2: 24 mEq/L (ref 19–32)
Calcium: 9.3 mg/dL (ref 8.4–10.5)
Creatinine, Ser: 1.84 mg/dL — ABNORMAL HIGH (ref 0.40–1.50)
GFR: 37.93 mL/min — ABNORMAL LOW (ref >60.00)
GLUCOSE: 95 mg/dL (ref 70–99)
POTASSIUM: 5 meq/L (ref 3.5–5.1)
Sodium: 138 mEq/L (ref 135–145)

## 2016-05-18 LAB — TSH: TSH: 2.53 u[IU]/mL (ref 0.35–4.50)

## 2016-05-18 LAB — PSA: PSA: 1.73 ng/mL (ref 0.10–4.00)

## 2016-05-18 MED ORDER — FINASTERIDE 5 MG PO TABS: 5.0000 mg | ORAL_TABLET | Freq: Every day | ORAL | 3 refills | Status: DC

## 2016-05-18 NOTE — Assessment & Plan Note (Addendum)
ENT ref Dr Elwyn Reach -- he has appt in 6 wks

## 2016-05-18 NOTE — Progress Notes (Signed)
Subjective:  Patient ID: Kenneth Williams, male    DOB: 1935/03/15  Age: 81 y.o. MRN: [id number]  CC: Hypertension; Hypothyroidism; and Shortness of Breath   HPI Kenneth Williams presents for depression, BPH, HTN f/u. C/o lack of motivation - wants to d/c Wellbutrin C/o hoarsness off and on x2 years C/o prostate Rx not helping C/o L nipple pain  Outpatient Medications Prior to Visit  Medication Sig Dispense Refill  . alfuzosin (UROXATRAL) 10 MG 24 hr tablet Take 1 tablet (10 mg total) by mouth daily with breakfast. 30 tablet 11  . aspirin 81 MG tablet Take 162 mg by mouth daily.    Marland Kitchen buPROPion (WELLBUTRIN XL) 300 MG 24 hr tablet TAKE 1 TABLET BY MOUTH DAILY 30 tablet 5  . cholecalciferol (VITAMIN D) 1000 UNITS tablet Take 2,000 Units by mouth daily.      . clotrimazole-betamethasone (LOTRISONE) cream Apply 1 application topically 2 (two) times daily. 45 g 1  . colchicine 0.6 MG tablet Take 1 tablet (0.6 mg total) by mouth daily as needed. 90 tablet 3  . Cyanocobalamin (VITAMIN B 12 PO) Take 1 tablet by mouth daily.      Marland Kitchen labetalol (NORMODYNE) 200 MG tablet TAKE 2 TABLETS BY MOUTH 2 TIMES DAILY. 360 tablet 3  . levothyroxine (SYNTHROID, LEVOTHROID) 88 MCG tablet TAKE 1 TABLET (88 MCG TOTAL) BY MOUTH DAILY. 30 tablet 11  . loperamide (IMODIUM A-D) 2 MG tablet Take 1 tablet (2 mg total) by mouth 4 (four) times daily as needed for diarrhea or loose stools. 30 tablet 0  . simvastatin (ZOCOR) 40 MG tablet TAKE 1 TABLET AT BEDTIME 90 tablet 3  . spironolactone (ALDACTONE) 25 MG tablet Take 1 tablet (25 mg total) by mouth daily. 30 tablet 11  . vortioxetine HBr (TRINTELLIX) 5 MG TABS 1 po qd 30 tablet 5   No facility-administered medications prior to visit.     ROS Review of Systems  Constitutional: Positive for fatigue. Negative for appetite change and unexpected weight change.  HENT: Positive for voice change. Negative for congestion, nosebleeds, sneezing, sore throat and trouble  swallowing.   Eyes: Negative for itching and visual disturbance.  Respiratory: Negative for cough.   Cardiovascular: Negative for chest pain, palpitations and leg swelling.  Gastrointestinal: Negative for abdominal distention, blood in stool, diarrhea and nausea.  Genitourinary: Positive for frequency. Negative for hematuria.  Musculoskeletal: Negative for back pain, gait problem, joint swelling and neck pain.  Skin: Negative for rash.  Neurological: Negative for dizziness, tremors, speech difficulty and weakness.  Psychiatric/Behavioral: Positive for decreased concentration and sleep disturbance. Negative for agitation, dysphoric mood and suicidal ideas. The patient is not nervous/anxious.     Objective:  BP 114/74   Pulse (!) 57   Temp 97.5 F (36.4 C) (Oral)   Resp 16   Ht 6\' 1"  (1.854 m)   Wt 201 lb 8 oz (91.4 kg)   SpO2 98%   BMI 26.58 kg/m   BP Readings from Last 3 Encounters:  05/18/16 114/74  02/06/16 140/80  12/22/15 (!) 148/90    Wt Readings from Last 3 Encounters:  05/18/16 201 lb 8 oz (91.4 kg)  02/06/16 200 lb (90.7 kg)  12/22/15 200 lb (90.7 kg)    Physical Exam  Constitutional: He is oriented to person, place, and time. He appears well-developed. No distress.  NAD  HENT:  Mouth/Throat: Oropharynx is clear and moist.  Eyes: Conjunctivae are normal. Pupils are equal, round, and reactive to  light.  Neck: Normal range of motion. No JVD present. No thyromegaly present.  Cardiovascular: Normal rate, regular rhythm, normal heart sounds and intact distal pulses.  Exam reveals no gallop and no friction rub.   No murmur heard. Pulmonary/Chest: Effort normal and breath sounds normal. No respiratory distress. He has no wheezes. He has no rales. He exhibits no tenderness.  Abdominal: Soft. Bowel sounds are normal. He exhibits no distension and no mass. There is no tenderness. There is no rebound and no guarding.  Musculoskeletal: Normal range of motion. He exhibits no  edema or tenderness.  Lymphadenopathy:    He has no cervical adenopathy.  Neurological: He is alert and oriented to person, place, and time. He has normal reflexes. No cranial nerve deficit. He exhibits normal muscle tone. He displays a negative Romberg sign. Coordination and gait normal.  Skin: Skin is warm and dry. No rash noted.  Psychiatric: His behavior is normal. Judgment and thought content normal.  L gynecomastia  Lab Results  Component Value Date   WBC 9.6 09/15/2015   HGB 14.4 09/15/2015   HCT 43.0 09/15/2015   PLT 268.0 09/15/2015   GLUCOSE 102 (H) 12/22/2015   CHOL 193 03/25/2014   TRIG 314.0 (H) 03/25/2014   HDL 32.80 (L) 03/25/2014   LDLDIRECT 123.0 03/25/2014   LDLCALC 144 (H) 09/12/2013   ALT 19 09/15/2015   AST 13 09/15/2015   NA 138 12/22/2015   K 4.8 12/22/2015   CL 106 12/22/2015   CREATININE 2.07 (H) 12/22/2015   BUN 28 (H) 12/22/2015   CO2 25 12/22/2015   TSH 2.44 10/09/2015   PSA 2.15 03/25/2014   INR 1.1 (H) 09/15/2015    Dg Chest 2 View  Result Date: 02/06/2016 CLINICAL DATA:  Dyspnea on exertion.  Follow-up pleural effusions. EXAM: CHEST  2 VIEW COMPARISON:  09/15/2015 FINDINGS: The cardiomediastinal silhouette is within normal limits. Aortic atherosclerosis is noted. There is minimal blunting of the costophrenic angles posteriorly, similar to the prior study. No sizable pleural effusion is present. There is minimal scarring in the left lung base. No airspace consolidation, edema, or pneumothorax is seen. Mild thoracic spondylosis is present. IMPRESSION: 1. No sizable pleural effusion. Minimal blunting of the costophrenic angles may reflect slight pleural thickening or at most trace pleural fluid. 2. No evidence of active cardiopulmonary disease. Electronically Signed   By: Logan Bores M.D.   On: 02/06/2016 11:32    Assessment & Plan:   There are no diagnoses linked to this encounter. I have discontinued Mr. Preis vortioxetine HBr. I am also  having him maintain his Cyanocobalamin (VITAMIN B 12 PO), cholecalciferol, colchicine, aspirin, clotrimazole-betamethasone, loperamide, spironolactone, alfuzosin, labetalol, simvastatin, buPROPion, and levothyroxine.  No orders of the defined types were placed in this encounter.    Follow-up: No Follow-up on file.  Walker Kehr, MD

## 2016-05-18 NOTE — Assessment & Plan Note (Signed)
Schedule mammo

## 2016-05-18 NOTE — Assessment & Plan Note (Signed)
Discussed. Stable

## 2016-05-18 NOTE — Assessment & Plan Note (Signed)
Not better Cont w/Uroxatral. Added Proscar Urol ref was offered

## 2016-05-18 NOTE — Assessment & Plan Note (Signed)
Monitor PSA 

## 2016-05-18 NOTE — Assessment & Plan Note (Signed)
The pt wants to d/c Wellbutrin - [place] [facility] ref offered

## 2016-05-18 NOTE — Assessment & Plan Note (Signed)
On Levothroid 

## 2016-05-18 NOTE — Progress Notes (Signed)
Pre-visit discussion using our clinic review tool. No additional management support is needed unless otherwise documented below in the visit note.  

## 2016-05-28 ENCOUNTER — Ambulatory Visit
Admission: RE | Admit: 2016-05-28 | Discharge: 2016-05-28 | Disposition: A | Discharge status: Home / Self Care | Payer: PPO | Source: Ambulatory Visit | Attending: Internal Medicine | Admitting: Internal Medicine

## 2016-05-28 DIAGNOSIS — N62 Hypertrophy of breast: Secondary | ICD-10-CM

## 2016-05-28 DIAGNOSIS — R928 Other abnormal and inconclusive findings on diagnostic imaging of breast: Secondary | ICD-10-CM | POA: Diagnosis not present

## 2016-05-30 ENCOUNTER — Other Ambulatory Visit: Payer: Self-pay | Admitting: Internal Medicine

## 2016-05-31 ENCOUNTER — Other Ambulatory Visit: Payer: Self-pay

## 2016-05-31 MED ORDER — ALFUZOSIN HCL ER 10 MG PO TB24: 10.0000 mg | ORAL_TABLET | Freq: Every day | ORAL | 11 refills | Status: DC

## 2016-05-31 NOTE — Telephone Encounter (Signed)
Rec'd fax from [facility] to refill Alfuzosin 10mg 

## 2016-06-17 ENCOUNTER — Ambulatory Visit (INDEPENDENT_AMBULATORY_CARE_PROVIDER_SITE_OTHER): Payer: PPO | Admitting: Internal Medicine

## 2016-06-17 ENCOUNTER — Encounter: Payer: Self-pay | Admitting: Internal Medicine

## 2016-06-17 DIAGNOSIS — I10 Essential (primary) hypertension: Secondary | ICD-10-CM | POA: Diagnosis not present

## 2016-06-17 DIAGNOSIS — R7989 Other specified abnormal findings of blood chemistry: Secondary | ICD-10-CM

## 2016-06-17 DIAGNOSIS — R453 Demoralization and apathy: Secondary | ICD-10-CM

## 2016-06-17 DIAGNOSIS — F332 Major depressive disorder, recurrent severe without psychotic features: Secondary | ICD-10-CM

## 2016-06-17 DIAGNOSIS — N62 Hypertrophy of breast: Secondary | ICD-10-CM | POA: Diagnosis not present

## 2016-06-17 NOTE — Assessment & Plan Note (Signed)
s/p mammogram D/c spironolactone

## 2016-06-17 NOTE — Progress Notes (Signed)
Subjective:  Patient ID: Kenneth Williams, male    DOB: Apr 24, 1935  Age: 81 y.o. MRN: [id number]  CC: Follow-up (follow up on med/)   HPI Kenneth Williams presents for L breast swelling possibly caused by Spironolactone. The pt has stopped spironolactone. L breast swelling is better F/u depression - he stopped Wellbutrin - doing ok F/u BPH  Outpatient Medications Prior to Visit  Medication Sig Dispense Refill  . alfuzosin (UROXATRAL) 10 MG 24 hr tablet Take 1 tablet (10 mg total) by mouth daily with breakfast. 30 tablet 11  . aspirin 81 MG tablet Take 162 mg by mouth daily.    . cholecalciferol (VITAMIN D) 1000 UNITS tablet Take 2,000 Units by mouth daily.      . clotrimazole-betamethasone (LOTRISONE) cream Apply 1 application topically 2 (two) times daily. 45 g 1  . colchicine 0.6 MG tablet Take 1 tablet (0.6 mg total) by mouth daily as needed. 90 tablet 3  . Cyanocobalamin (VITAMIN B 12 PO) Take 1 tablet by mouth daily.      . finasteride (PROSCAR) 5 MG tablet Take 1 tablet (5 mg total) by mouth daily. 100 tablet 3  . labetalol (NORMODYNE) 200 MG tablet TAKE 2 TABLETS BY MOUTH 2 TIMES DAILY. 360 tablet 3  . levothyroxine (SYNTHROID, LEVOTHROID) 88 MCG tablet TAKE 1 TABLET (88 MCG TOTAL) BY MOUTH DAILY. 30 tablet 11  . loperamide (IMODIUM A-D) 2 MG tablet Take 1 tablet (2 mg total) by mouth 4 (four) times daily as needed for diarrhea or loose stools. 30 tablet 0  . simvastatin (ZOCOR) 40 MG tablet TAKE 1 TABLET AT BEDTIME 90 tablet 3  . spironolactone (ALDACTONE) 25 MG tablet TAKE 1 TABLET (25 MG TOTAL) BY MOUTH DAILY. (Patient not taking: Reported on 06/17/2016) 30 tablet 11   No facility-administered medications prior to visit.     ROS Review of Systems  Constitutional: Positive for fatigue. Negative for appetite change and unexpected weight change.  HENT: Negative for congestion, nosebleeds, sneezing, sore throat and trouble swallowing.   Eyes: Negative for itching and visual  disturbance.  Respiratory: Negative for cough.   Cardiovascular: Negative for chest pain, palpitations and leg swelling.  Gastrointestinal: Negative for abdominal distention, blood in stool, diarrhea and nausea.  Genitourinary: Positive for frequency and urgency. Negative for hematuria.  Musculoskeletal: Negative for back pain, gait problem, joint swelling and neck pain.  Skin: Negative for rash.  Neurological: Negative for dizziness, tremors, speech difficulty and weakness.  Psychiatric/Behavioral: Positive for decreased concentration. Negative for agitation, behavioral problems, confusion, dysphoric mood, sleep disturbance and suicidal ideas. The patient is not nervous/anxious.     Objective:  BP 132/80   Pulse (!) 58   Temp 97.5 F (36.4 C)   Ht 6\' 1"  (1.854 m)   Wt 202 lb (91.6 kg)   SpO2 99%   BMI 26.65 kg/m   BP Readings from Last 3 Encounters:  06/17/16 132/80  05/18/16 114/74  02/06/16 140/80    Wt Readings from Last 3 Encounters:  06/17/16 202 lb (91.6 kg)  05/18/16 201 lb 8 oz (91.4 kg)  02/06/16 200 lb (90.7 kg)    Physical Exam  Constitutional: He is oriented to person, place, and time. He appears well-developed. No distress.  NAD  HENT:  Mouth/Throat: Oropharynx is clear and moist.  Eyes: Conjunctivae are normal. Pupils are equal, round, and reactive to light.  Neck: Normal range of motion. No JVD present. No thyromegaly present.  Cardiovascular: Normal rate, regular  rhythm, normal heart sounds and intact distal pulses.  Exam reveals no gallop and no friction rub.   No murmur heard. Pulmonary/Chest: Effort normal and breath sounds normal. No respiratory distress. He has no wheezes. He has no rales. He exhibits no tenderness.  Abdominal: Soft. Bowel sounds are normal. He exhibits no distension and no mass. There is no tenderness. There is no rebound and no guarding.  Musculoskeletal: Normal range of motion. He exhibits no edema or tenderness.    Lymphadenopathy:    He has no cervical adenopathy.  Neurological: He is alert and oriented to person, place, and time. He has normal reflexes. No cranial nerve deficit. He exhibits normal muscle tone. He displays a negative Romberg sign. Coordination and gait normal.  Skin: Skin is warm and dry. No rash noted.  Psychiatric: He has a normal mood and affect. His behavior is normal. Judgment and thought content normal.  L breast swelling is better Hard of hearing  Lab Results  Component Value Date   WBC 9.6 09/15/2015   HGB 14.4 09/15/2015   HCT 43.0 09/15/2015   PLT 268.0 09/15/2015   GLUCOSE 95 05/18/2016   CHOL 193 03/25/2014   TRIG 314.0 (H) 03/25/2014   HDL 32.80 (L) 03/25/2014   LDLDIRECT 123.0 03/25/2014   LDLCALC 144 (H) 09/12/2013   ALT 23 05/18/2016   AST 17 05/18/2016   NA 138 05/18/2016   K 5.0 05/18/2016   CL 107 05/18/2016   CREATININE 1.84 (H) 05/18/2016   BUN 27 (H) 05/18/2016   CO2 24 05/18/2016   TSH 2.53 05/18/2016   PSA 1.73 05/18/2016   INR 1.1 (H) 09/15/2015    Mm Diag Breast Tomo Bilateral  Result Date: 05/28/2016 CLINICAL DATA:  81 year old presenting with a tender palpable lump behind the left nipple which she initially noted approximately 6 weeks ago. At the same time he noted pigmented lesions on the upper portion of the left areola. Baseline examination. EXAM: 2D DIGITAL DIAGNOSTIC BILATERAL MAMMOGRAM WITH CAD AND ADJUNCT TOMO COMPARISON:  None. ACR Breast Density Category b: There are scattered areas of fibroglandular density. FINDINGS: Standard 2D and tomosynthesis full field CC and MLO views of both breasts were obtained. A standard and tomosynthesis spot-compression view of the area palpable concern behind the left nipple was also obtained. Mild gynecomastia is present on the left. There is no evidence of right gynecomastia. A skin lesion is noted in the far inner portion of the right breast. Mammographic images were processed with CAD. On physical  examination, the tissues behind the left nipple are soft and I do not palpate a discrete mass. There are multiple pigmented raised lesions involving the superior left areola. There is a skin lesion in the far inner right breast corresponding to the mammographic finding. IMPRESSION: 1. Mild left gynecomastia. 2. No evidence of right gynecomastia. 3. No mammographic evidence of malignancy involving either breast. RECOMMENDATION: 1. No further imaging follow-up is felt necessary unless there are persistent or intervening clinical concerns. 2. Dermatology consultation may be considered for the skin lesions on the areola since they are new. I discussed with the patient the fact that gynecomastia can occur in men as testosterone levels decrease with age or in younger men with low testosterone levels, causing a change in the serum testosterone:estrogen ratio. I also discussed the fact that there are numerous medications which are associated with gynecomastia and I note that the patient currently takes spironolactone which is associated with gynecomastia. Approximately 20% of cases of gynecomastia  are idiopathic. I counseled the patient to perform self-examination to make sure that a hard lump does not form which could indicate malignancy and would need further evaluation. We also discussed the possibility of surgical excision if symptoms continue and if an etiology of the gynecomastia cannot be determined and therefore corrected. I have discussed the findings and recommendations with the patient. Results were also provided in writing at the conclusion of the visit. BI-RADS CATEGORY  2: Benign. Electronically Signed   By: Evangeline Dakin M.D.   On: 05/28/2016 10:49    Assessment & Plan:   There are no diagnoses linked to this encounter. I am having Mr. Deller maintain his Cyanocobalamin (VITAMIN B 12 PO), cholecalciferol, colchicine, aspirin, clotrimazole-betamethasone, loperamide, labetalol, simvastatin,  levothyroxine, finasteride, spironolactone, and alfuzosin.  No orders of the defined types were placed in this encounter.    Follow-up: No Follow-up on file.  Walker Kehr, MD

## 2016-06-17 NOTE — Patient Instructions (Signed)
MC well w/Jill 

## 2016-06-17 NOTE — Assessment & Plan Note (Signed)
Monitor labs 

## 2016-06-17 NOTE — Assessment & Plan Note (Signed)
Pt stopped Wellbutrin Doing fair off rx

## 2016-06-17 NOTE — Progress Notes (Signed)
Pre visit review using our clinic review tool, if applicable. No additional management support is needed unless otherwise documented below in the visit note. 

## 2016-06-17 NOTE — Assessment & Plan Note (Signed)
D/c spironolactone

## 2016-06-17 NOTE — Assessment & Plan Note (Signed)
Seems to be better

## 2016-06-22 ENCOUNTER — Ambulatory Visit: Payer: PPO | Admitting: Internal Medicine

## 2016-06-23 ENCOUNTER — Telehealth: Payer: Self-pay | Admitting: Internal Medicine

## 2016-06-23 NOTE — Telephone Encounter (Signed)
Patient state he is getting ready to fly out of town at 10am this morning.  Michela Pitcher he has had a gout flare up this morning and would like to know if there would be any way Dr. Camila Li could send a script to [facility] on [address] Before then?  Please follow up with patient in regard.

## 2016-06-23 NOTE — Telephone Encounter (Signed)
LM for pt to call back and let us know if he would like RX sent to where he is going(if okay with [facility])

## 2016-06-24 ENCOUNTER — Other Ambulatory Visit: Payer: Self-pay | Admitting: *Deleted

## 2016-06-24 MED ORDER — COLCHICINE 0.6 MG PO TABS: 0.6000 mg | ORAL_TABLET | Freq: Every day | ORAL | 0 refills | Status: DC | PRN

## 2016-06-24 NOTE — Telephone Encounter (Signed)
Rec'd call back from daughter Volney Presser) father is needing his colchicine refilled. He is in [place] not and not sure which [facility] he will [facility], but [facility] [address] [facility] script. Inform daughter will send [facility]icine to [facility] on [address]...Kenneth Williams

## 2016-07-07 ENCOUNTER — Telehealth: Payer: Self-pay | Admitting: Internal Medicine

## 2016-07-07 DIAGNOSIS — H906 Mixed conductive and sensorineural hearing loss, bilateral: Secondary | ICD-10-CM | POA: Diagnosis not present

## 2016-07-07 DIAGNOSIS — J383 Other diseases of vocal cords: Secondary | ICD-10-CM | POA: Diagnosis not present

## 2016-07-07 DIAGNOSIS — H8081 Other otosclerosis, right ear: Secondary | ICD-10-CM | POA: Diagnosis not present

## 2016-07-07 NOTE — Telephone Encounter (Signed)
Pt will need to see someone in the office or ED for evaluation.

## 2016-07-07 NOTE — Telephone Encounter (Signed)
Pt was seen by his ear doctor, they took his BP and it was over 200. Was wondering if he can be worked in to be seen sooner than next Friday which is his first available.   Please advise.

## 2016-07-12 NOTE — Telephone Encounter (Signed)
Left patient vm on both phone numbers to call back to make appt

## 2016-07-12 NOTE — Telephone Encounter (Signed)
Agree. Thx 

## 2016-07-15 ENCOUNTER — Other Ambulatory Visit: Payer: Self-pay

## 2016-07-15 MED ORDER — COLCHICINE 0.6 MG PO TABS: 0.6000 mg | ORAL_TABLET | Freq: Every day | ORAL | 0 refills | Status: DC | PRN

## 2016-07-16 NOTE — Telephone Encounter (Signed)
Contacted patient.  Have scheduled appt for 5/30.  Patient also wants to be seen for gout. Placed patient in a 30 minute OV to treat both.

## 2016-07-16 NOTE — Telephone Encounter (Signed)
noted thank you

## 2016-07-21 ENCOUNTER — Encounter: Payer: Self-pay | Admitting: Internal Medicine

## 2016-07-21 ENCOUNTER — Ambulatory Visit (INDEPENDENT_AMBULATORY_CARE_PROVIDER_SITE_OTHER): Payer: PPO | Admitting: Internal Medicine

## 2016-07-21 ENCOUNTER — Other Ambulatory Visit (INDEPENDENT_AMBULATORY_CARE_PROVIDER_SITE_OTHER): Payer: PPO

## 2016-07-21 ENCOUNTER — Other Ambulatory Visit: Payer: Self-pay | Admitting: Internal Medicine

## 2016-07-21 DIAGNOSIS — M1000 Idiopathic gout, unspecified site: Secondary | ICD-10-CM

## 2016-07-21 DIAGNOSIS — G459 Transient cerebral ischemic attack, unspecified: Secondary | ICD-10-CM

## 2016-07-21 DIAGNOSIS — E034 Atrophy of thyroid (acquired): Secondary | ICD-10-CM

## 2016-07-21 DIAGNOSIS — E785 Hyperlipidemia, unspecified: Secondary | ICD-10-CM | POA: Insufficient documentation

## 2016-07-21 DIAGNOSIS — I10 Essential (primary) hypertension: Secondary | ICD-10-CM

## 2016-07-21 DIAGNOSIS — M109 Gout, unspecified: Secondary | ICD-10-CM | POA: Insufficient documentation

## 2016-07-21 HISTORY — DX: Gout, unspecified: M10.9

## 2016-07-21 HISTORY — DX: Hyperlipidemia, unspecified: E78.5

## 2016-07-21 LAB — URIC ACID: URIC ACID, SERUM: 8.3 mg/dL — AB (ref 4.0–7.8)

## 2016-07-21 LAB — BASIC METABOLIC PANEL
BUN: 28 mg/dL — ABNORMAL HIGH (ref 6–23)
CALCIUM: 9.2 mg/dL (ref 8.4–10.5)
CO2: 23 mEq/L (ref 19–32)
CREATININE: 2 mg/dL — AB (ref 0.40–1.50)
Chloride: 109 mEq/L (ref 96–112)
GFR: 34.43 mL/min — AB (ref >60.00)
Glucose, Bld: 95 mg/dL (ref 70–99)
Potassium: 4.2 mEq/L (ref 3.5–5.1)
Sodium: 139 mEq/L (ref 135–145)

## 2016-07-21 MED ORDER — ATORVASTATIN CALCIUM 20 MG PO TABS: 20.0000 mg | ORAL_TABLET | Freq: Every day | ORAL | 3 refills | Status: DC

## 2016-07-21 MED ORDER — COLCHICINE 0.6 MG PO TABS: 0.6000 mg | ORAL_TABLET | Freq: Three times a day (TID) | ORAL | 2 refills | Status: DC | PRN

## 2016-07-21 MED ORDER — AMLODIPINE BESYLATE 5 MG PO TABS: 5.0000 mg | ORAL_TABLET | Freq: Every day | ORAL | 11 refills | Status: DC

## 2016-07-21 NOTE — Assessment & Plan Note (Signed)
Cont Labetalol Start Norvasc again at a lower dose NAS diet

## 2016-07-21 NOTE — Assessment & Plan Note (Signed)
Colchicine prn Labs

## 2016-07-21 NOTE — Progress Notes (Signed)
Subjective:  Patient ID: Kenneth Williams, male    DOB: Sep 12, 1935  Age: 81 y.o. MRN: [id number]  CC: No chief complaint on file.   HPI ABDISHAKUR GOTTSCHALL presents for elevated BP with SBP 180-200. C/o L 1st MTP pain F/u CRF  Outpatient Medications Prior to Visit  Medication Sig Dispense Refill  . alfuzosin (UROXATRAL) 10 MG 24 hr tablet Take 1 tablet (10 mg total) by mouth daily with breakfast. 30 tablet 11  . aspirin 81 MG tablet Take 162 mg by mouth daily.    . cholecalciferol (VITAMIN D) 1000 UNITS tablet Take 2,000 Units by mouth daily.      . clotrimazole-betamethasone (LOTRISONE) cream Apply 1 application topically 2 (two) times daily. 45 g 1  . colchicine 0.6 MG tablet TAKE 1 TABLET (0.6 MG TOTAL) BY MOUTH DAILY AS NEEDED. 30 tablet 0  . Cyanocobalamin (VITAMIN B 12 PO) Take 1 tablet by mouth daily.      . finasteride (PROSCAR) 5 MG tablet Take 1 tablet (5 mg total) by mouth daily. 100 tablet 3  . labetalol (NORMODYNE) 200 MG tablet TAKE 2 TABLETS BY MOUTH 2 TIMES DAILY. 360 tablet 3  . levothyroxine (SYNTHROID, LEVOTHROID) 88 MCG tablet TAKE 1 TABLET (88 MCG TOTAL) BY MOUTH DAILY. 30 tablet 11  . loperamide (IMODIUM A-D) 2 MG tablet Take 1 tablet (2 mg total) by mouth 4 (four) times daily as needed for diarrhea or loose stools. 30 tablet 0  . simvastatin (ZOCOR) 40 MG tablet TAKE 1 TABLET AT BEDTIME 90 tablet 3   No facility-administered medications prior to visit.     ROS Review of Systems  Constitutional: Positive for fatigue. Negative for appetite change and unexpected weight change.  HENT: Positive for hearing loss. Negative for congestion, nosebleeds, sneezing, sore throat and trouble swallowing.   Eyes: Negative for itching and visual disturbance.  Respiratory: Negative for cough.   Cardiovascular: Negative for chest pain, palpitations and leg swelling.  Gastrointestinal: Negative for abdominal distention, blood in stool, diarrhea and nausea.  Genitourinary: Negative for  frequency and hematuria.  Musculoskeletal: Positive for arthralgias. Negative for back pain, gait problem, joint swelling and neck pain.  Skin: Negative for rash.  Neurological: Negative for dizziness, tremors, speech difficulty and weakness.  Psychiatric/Behavioral: Negative for agitation, dysphoric mood and sleep disturbance. The patient is not nervous/anxious.     Objective:  BP (!) 158/92 (BP Location: Left Arm, Patient Position: Sitting, Cuff Size: Normal)   Pulse 65   Temp 98.3 F (36.8 C) (Oral)   Ht 6\' 1"  (1.854 m)   Wt 206 lb (93.4 kg)   SpO2 98%   BMI 27.18 kg/m   BP Readings from Last 3 Encounters:  07/21/16 (!) 158/92  06/17/16 132/80  05/18/16 114/74    Wt Readings from Last 3 Encounters:  07/21/16 206 lb (93.4 kg)  06/17/16 202 lb (91.6 kg)  05/18/16 201 lb 8 oz (91.4 kg)    Physical Exam  Constitutional: He is oriented to person, place, and time. He appears well-developed. No distress.  NAD  HENT:  Mouth/Throat: Oropharynx is clear and moist.  Eyes: Conjunctivae are normal. Pupils are equal, round, and reactive to light.  Neck: Normal range of motion. No JVD present. No thyromegaly present.  Cardiovascular: Normal rate, regular rhythm, normal heart sounds and intact distal pulses.  Exam reveals no gallop and no friction rub.   No murmur heard. Pulmonary/Chest: Effort normal and breath sounds normal. No respiratory distress. He has  no wheezes. He has no rales. He exhibits no tenderness.  Abdominal: Soft. Bowel sounds are normal. He exhibits no distension and no mass. There is no tenderness. There is no rebound and no guarding.  Musculoskeletal: Normal range of motion. He exhibits tenderness. He exhibits no edema.  Lymphadenopathy:    He has no cervical adenopathy.  Neurological: He is alert and oriented to person, place, and time. He has normal reflexes. No cranial nerve deficit. He exhibits normal muscle tone. He displays a negative Romberg sign.  Coordination and gait normal.  Skin: Skin is warm and dry. No rash noted.  Psychiatric: He has a normal mood and affect. His behavior is normal. Judgment and thought content normal.   L 1st MTP - tender  Lab Results  Component Value Date   WBC 9.6 09/15/2015   HGB 14.4 09/15/2015   HCT 43.0 09/15/2015   PLT 268.0 09/15/2015   GLUCOSE 95 05/18/2016   CHOL 193 03/25/2014   TRIG 314.0 (H) 03/25/2014   HDL 32.80 (L) 03/25/2014   LDLDIRECT 123.0 03/25/2014   LDLCALC 144 (H) 09/12/2013   ALT 23 05/18/2016   AST 17 05/18/2016   NA 138 05/18/2016   K 5.0 05/18/2016   CL 107 05/18/2016   CREATININE 1.84 (H) 05/18/2016   BUN 27 (H) 05/18/2016   CO2 24 05/18/2016   TSH 2.53 05/18/2016   PSA 1.73 05/18/2016   INR 1.1 (H) 09/15/2015    Mm Diag Breast Tomo Bilateral  Result Date: 05/28/2016 CLINICAL DATA:  81 year old presenting with a tender palpable lump behind the left nipple which she initially noted approximately 6 weeks ago. At the same time he noted pigmented lesions on the upper portion of the left areola. Baseline examination. EXAM: 2D DIGITAL DIAGNOSTIC BILATERAL MAMMOGRAM WITH CAD AND ADJUNCT TOMO COMPARISON:  None. ACR Breast Density Category b: There are scattered areas of fibroglandular density. FINDINGS: Standard 2D and tomosynthesis full field CC and MLO views of both breasts were obtained. A standard and tomosynthesis spot-compression view of the area palpable concern behind the left nipple was also obtained. Mild gynecomastia is present on the left. There is no evidence of right gynecomastia. A skin lesion is noted in the far inner portion of the right breast. Mammographic images were processed with CAD. On physical examination, the tissues behind the left nipple are soft and I do not palpate a discrete mass. There are multiple pigmented raised lesions involving the superior left areola. There is a skin lesion in the far inner right breast corresponding to the mammographic  finding. IMPRESSION: 1. Mild left gynecomastia. 2. No evidence of right gynecomastia. 3. No mammographic evidence of malignancy involving either breast. RECOMMENDATION: 1. No further imaging follow-up is felt necessary unless there are persistent or intervening clinical concerns. 2. Dermatology consultation may be considered for the skin lesions on the areola since they are new. I discussed with the patient the fact that gynecomastia can occur in men as testosterone levels decrease with age or in younger men with low testosterone levels, causing a change in the serum testosterone:estrogen ratio. I also discussed the fact that there are numerous medications which are associated with gynecomastia and I note that the patient currently takes spironolactone which is associated with gynecomastia. Approximately 20% of cases of gynecomastia are idiopathic. I counseled the patient to perform self-examination to make sure that a hard lump does not form which could indicate malignancy and would need further evaluation. We also discussed the possibility of surgical excision  if symptoms continue and if an etiology of the gynecomastia cannot be determined and therefore corrected. I have discussed the findings and recommendations with the patient. Results were also provided in writing at the conclusion of the visit. BI-RADS CATEGORY  2: Benign. Electronically Signed   By: Evangeline Dakin M.D.   On: 05/28/2016 10:49    Assessment & Plan:   There are no diagnoses linked to this encounter. I am having Mr. Burges maintain his Cyanocobalamin (VITAMIN B 12 PO), cholecalciferol, aspirin, clotrimazole-betamethasone, loperamide, labetalol, simvastatin, levothyroxine, finasteride, alfuzosin, and colchicine.  No orders of the defined types were placed in this encounter.    Follow-up: No Follow-up on file.  Walker Kehr, MD

## 2016-07-21 NOTE — Patient Instructions (Signed)
We will have to stop Simvastatin due to potential drug interactions Start Lipitor instead

## 2016-07-21 NOTE — Assessment & Plan Note (Signed)
Start Norvasc again at a lower dose for a better BP control NAS diet

## 2016-07-21 NOTE — Assessment & Plan Note (Signed)
On Levothroid 

## 2016-07-21 NOTE — Assessment & Plan Note (Signed)
D/c Simvastatin due to potential drug interactions Start Lipitor

## 2016-07-23 ENCOUNTER — Ambulatory Visit: Payer: PPO | Admitting: Internal Medicine

## 2016-09-01 ENCOUNTER — Encounter: Payer: Self-pay | Admitting: Internal Medicine

## 2016-09-01 ENCOUNTER — Ambulatory Visit (INDEPENDENT_AMBULATORY_CARE_PROVIDER_SITE_OTHER): Payer: PPO | Admitting: Internal Medicine

## 2016-09-01 DIAGNOSIS — H9193 Unspecified hearing loss, bilateral: Secondary | ICD-10-CM | POA: Diagnosis not present

## 2016-09-01 DIAGNOSIS — I10 Essential (primary) hypertension: Secondary | ICD-10-CM

## 2016-09-01 DIAGNOSIS — R600 Localized edema: Secondary | ICD-10-CM | POA: Diagnosis not present

## 2016-09-01 DIAGNOSIS — E034 Atrophy of thyroid (acquired): Secondary | ICD-10-CM

## 2016-09-01 DIAGNOSIS — E785 Hyperlipidemia, unspecified: Secondary | ICD-10-CM | POA: Diagnosis not present

## 2016-09-01 DIAGNOSIS — G459 Transient cerebral ischemic attack, unspecified: Secondary | ICD-10-CM | POA: Diagnosis not present

## 2016-09-01 DIAGNOSIS — N62 Hypertrophy of breast: Secondary | ICD-10-CM | POA: Diagnosis not present

## 2016-09-01 DIAGNOSIS — R413 Other amnesia: Secondary | ICD-10-CM | POA: Diagnosis not present

## 2016-09-01 MED ORDER — AMLODIPINE BESYLATE 5 MG PO TABS: 2.5000 mg | ORAL_TABLET | Freq: Every day | ORAL | 11 refills | Status: DC

## 2016-09-01 NOTE — Assessment & Plan Note (Signed)
Norvasc - reduce to 2.5 mg/d due to swelling

## 2016-09-01 NOTE — Assessment & Plan Note (Signed)
much better - hearing was restored

## 2016-09-01 NOTE — Patient Instructions (Addendum)
Norvasc - please reduce to 2.5 mg/day due to leg swelling

## 2016-09-01 NOTE — Assessment & Plan Note (Signed)
Start Lipitor.

## 2016-09-01 NOTE — Progress Notes (Signed)
Subjective:  Patient ID: Kenneth Williams, male    DOB: 1935-03-05  Age: 81 y.o. MRN: [id number]  CC: No chief complaint on file.   HPI Kenneth Williams presents for HTN, depression - better, OAB/BPH f/u. Hearing is much better now...  Outpatient Medications Prior to Visit  Medication Sig Dispense Refill  . alfuzosin (UROXATRAL) 10 MG 24 hr tablet Take 1 tablet (10 mg total) by mouth daily with breakfast. 30 tablet 11  . amLODipine (NORVASC) 5 MG tablet Take 1 tablet (5 mg total) by mouth daily. 30 tablet 11  . aspirin 81 MG tablet Take 162 mg by mouth daily.    Marland Kitchen atorvastatin (LIPITOR) 20 MG tablet Take 1 tablet (20 mg total) by mouth daily. 90 tablet 3  . cholecalciferol (VITAMIN D) 1000 UNITS tablet Take 2,000 Units by mouth daily.      . clotrimazole-betamethasone (LOTRISONE) cream Apply 1 application topically 2 (two) times daily. 45 g 1  . colchicine 0.6 MG tablet Take 1 tablet (0.6 mg total) by mouth 3 (three) times daily as needed. For gout 60 tablet 2  . Cyanocobalamin (VITAMIN B 12 PO) Take 1 tablet by mouth daily.      . finasteride (PROSCAR) 5 MG tablet Take 1 tablet (5 mg total) by mouth daily. 100 tablet 3  . labetalol (NORMODYNE) 200 MG tablet TAKE 2 TABLETS BY MOUTH 2 TIMES DAILY. 360 tablet 3  . levothyroxine (SYNTHROID, LEVOTHROID) 88 MCG tablet TAKE 1 TABLET (88 MCG TOTAL) BY MOUTH DAILY. 30 tablet 11  . loperamide (IMODIUM A-D) 2 MG tablet Take 1 tablet (2 mg total) by mouth 4 (four) times daily as needed for diarrhea or loose stools. 30 tablet 0   No facility-administered medications prior to visit.     ROS Review of Systems  Constitutional: Negative for appetite change, fatigue and unexpected weight change.  HENT: Negative for congestion, nosebleeds, sneezing, sore throat and trouble swallowing.   Eyes: Negative for itching and visual disturbance.  Respiratory: Negative for cough.   Cardiovascular: Negative for chest pain, palpitations and leg swelling.    Gastrointestinal: Negative for abdominal distention, blood in stool, diarrhea and nausea.  Genitourinary: Negative for frequency and hematuria.  Musculoskeletal: Positive for arthralgias. Negative for back pain, gait problem, joint swelling and neck pain.  Skin: Negative for rash.  Neurological: Negative for dizziness, tremors, speech difficulty and weakness.  Psychiatric/Behavioral: Positive for decreased concentration. Negative for agitation, dysphoric mood and sleep disturbance. The patient is not nervous/anxious.     Objective:  BP (!) 142/86 (BP Location: Left Arm, Patient Position: Sitting, Cuff Size: Large)   Pulse 61   Temp 97.8 F (36.6 C) (Oral)   Ht 6\' 1"  (1.854 m)   Wt 206 lb (93.4 kg)   SpO2 98%   BMI 27.18 kg/m   BP Readings from Last 3 Encounters:  09/01/16 (!) 142/86  07/21/16 (!) 158/92  06/17/16 132/80    Wt Readings from Last 3 Encounters:  09/01/16 206 lb (93.4 kg)  07/21/16 206 lb (93.4 kg)  06/17/16 202 lb (91.6 kg)    Physical Exam  Constitutional: He is oriented to person, place, and time. He appears well-developed. No distress.  NAD  HENT:  Mouth/Throat: Oropharynx is clear and moist.  Eyes: Conjunctivae are normal. Pupils are equal, round, and reactive to light.  Neck: Normal range of motion. No JVD present. No thyromegaly present.  Cardiovascular: Normal rate, regular rhythm, normal heart sounds and intact distal pulses.  Exam reveals no gallop and no friction rub.   No murmur heard. Pulmonary/Chest: Effort normal and breath sounds normal. No respiratory distress. He has no wheezes. He has no rales. He exhibits no tenderness.  Abdominal: Soft. Bowel sounds are normal. He exhibits no distension and no mass. There is no tenderness. There is no rebound and no guarding.  Musculoskeletal: Normal range of motion. He exhibits no edema or tenderness.  Lymphadenopathy:    He has no cervical adenopathy.  Neurological: He is alert and oriented to person,  place, and time. He has normal reflexes. No cranial nerve deficit. He exhibits normal muscle tone. He displays a negative Romberg sign. Coordination and gait normal.  Skin: Skin is warm and dry. No rash noted.  Psychiatric: He has a normal mood and affect. His behavior is normal. Judgment and thought content normal.  hearing aid implant R Trace edema B  Lab Results  Component Value Date   WBC 9.6 09/15/2015   HGB 14.4 09/15/2015   HCT 43.0 09/15/2015   PLT 268.0 09/15/2015   GLUCOSE 95 07/21/2016   CHOL 193 03/25/2014   TRIG 314.0 (H) 03/25/2014   HDL 32.80 (L) 03/25/2014   LDLDIRECT 123.0 03/25/2014   LDLCALC 144 (H) 09/12/2013   ALT 23 05/18/2016   AST 17 05/18/2016   NA 139 07/21/2016   K 4.2 07/21/2016   CL 109 07/21/2016   CREATININE 2.00 (H) 07/21/2016   BUN 28 (H) 07/21/2016   CO2 23 07/21/2016   TSH 2.53 05/18/2016   PSA 1.73 05/18/2016   INR 1.1 (H) 09/15/2015    Mm Diag Breast Tomo Bilateral  Result Date: 05/28/2016 CLINICAL DATA:  81 year old presenting with a tender palpable lump behind the left nipple which she initially noted approximately 6 weeks ago. At the same time he noted pigmented lesions on the upper portion of the left areola. Baseline examination. EXAM: 2D DIGITAL DIAGNOSTIC BILATERAL MAMMOGRAM WITH CAD AND ADJUNCT TOMO COMPARISON:  None. ACR Breast Density Category b: There are scattered areas of fibroglandular density. FINDINGS: Standard 2D and tomosynthesis full field CC and MLO views of both breasts were obtained. A standard and tomosynthesis spot-compression view of the area palpable concern behind the left nipple was also obtained. Mild gynecomastia is present on the left. There is no evidence of right gynecomastia. A skin lesion is noted in the far inner portion of the right breast. Mammographic images were processed with CAD. On physical examination, the tissues behind the left nipple are soft and I do not palpate a discrete mass. There are multiple  pigmented raised lesions involving the superior left areola. There is a skin lesion in the far inner right breast corresponding to the mammographic finding. IMPRESSION: 1. Mild left gynecomastia. 2. No evidence of right gynecomastia. 3. No mammographic evidence of malignancy involving either breast. RECOMMENDATION: 1. No further imaging follow-up is felt necessary unless there are persistent or intervening clinical concerns. 2. Dermatology consultation may be considered for the skin lesions on the areola since they are new. I discussed with the patient the fact that gynecomastia can occur in men as testosterone levels decrease with age or in younger men with low testosterone levels, causing a change in the serum testosterone:estrogen ratio. I also discussed the fact that there are numerous medications which are associated with gynecomastia and I note that the patient currently takes spironolactone which is associated with gynecomastia. Approximately 20% of cases of gynecomastia are idiopathic. I counseled the patient to perform self-examination to make  sure that a hard lump does not form which could indicate malignancy and would need further evaluation. We also discussed the possibility of surgical excision if symptoms continue and if an etiology of the gynecomastia cannot be determined and therefore corrected. I have discussed the findings and recommendations with the patient. Results were also provided in writing at the conclusion of the visit. BI-RADS CATEGORY  2: Benign. Electronically Signed   By: Evangeline Dakin M.D.   On: 05/28/2016 10:49    Assessment & Plan:   There are no diagnoses linked to this encounter. I am having Mr. Ahr maintain his Cyanocobalamin (VITAMIN B 12 PO), cholecalciferol, aspirin, clotrimazole-betamethasone, loperamide, labetalol, levothyroxine, finasteride, alfuzosin, colchicine, amLODipine, and atorvastatin.  No orders of the defined types were placed in this  encounter.    Follow-up: No Follow-up on file.  Walker Kehr, MD

## 2016-09-01 NOTE — Assessment & Plan Note (Signed)
On Levothroid 

## 2016-09-01 NOTE — Assessment & Plan Note (Signed)
Getting better off spironolactone

## 2016-09-01 NOTE — Assessment & Plan Note (Signed)
Much better after hearing was restored

## 2016-09-01 NOTE — Assessment & Plan Note (Signed)
No relapse on ASA

## 2016-09-23 ENCOUNTER — Ambulatory Visit: Payer: PPO | Admitting: Internal Medicine

## 2016-09-23 ENCOUNTER — Ambulatory Visit (INDEPENDENT_AMBULATORY_CARE_PROVIDER_SITE_OTHER): Payer: PPO | Admitting: *Deleted

## 2016-09-23 VITALS — BP 138/85 | HR 67 | Resp 20 | Ht 73.0 in | Wt 204.0 lb

## 2016-09-23 DIAGNOSIS — Z0000 Encounter for general adult medical examination without abnormal findings: Secondary | ICD-10-CM | POA: Diagnosis not present

## 2016-09-23 NOTE — Progress Notes (Addendum)
Subjective:   Kenneth Williams is a 81 y.o. male who presents for Medicare Annual/Subsequent preventive examination.  Review of Systems:  No ROS.  Medicare Wellness Visit. Additional risk factors are reflected in the social history.  Cardiac Risk Factors include: advanced age (>82men, >22 women);dyslipidemia;male gender  Sleep patterns: gets up 2-3 times nightly to void and sleeps 5-6 hours nightly. Patient reports insomnia issues, discussed recommended sleep tips and stress reduction tips, education was attached to patient's AVS.   Home Safety/Smoke Alarms: Feels safe in home. Smoke alarms in place.  Living environment; residence and Firearm Safety: 1-story house/ trailer, no firearms. Lives alone, no needs for DME, good support system Seat Belt Safety/Bike Helmet: Wears seat belt.   Counseling:   Eye Exam- appointment yearly  Dental- appointment yearly  Male:   CCS- Last 10/10/12, no recall due to age     PSA-  Lab Results  Component Value Date   PSA 1.73 05/18/2016   PSA 2.15 03/25/2014   PSA 1.17 03/16/2013       Objective:    Vitals: BP 138/85   Pulse 67   Resp 20   Ht 6\' 1"  (1.854 m)   Wt 204 lb (92.5 kg)   SpO2 98%   BMI 26.91 kg/m   Body mass index is 26.91 kg/m.  Tobacco History  Smoking Status  . Never Smoker  Smokeless Tobacco  . Never Used     Counseling given: Not Answered   Past Medical History:  Diagnosis Date  . BPH (benign prostatic hyperplasia)   . CAD (coronary artery disease)    cath 2011-mild non obstructive  . Elevated PSA    history of   . Gout    2009  . History of diverticulitis of colon   . History of pancreatitis   . History of TIAs   . Hyperlipidemia   . Hypertension   . Hypothyroidism   . Hypothyroidism   . Left ear hearing loss    wears right hearing aid  . Personal history of hyperthyroidism 2006   s/p 131I- Dr. Chalmers Cater, Hyperthyroid  . Renal insufficiency 2011  . TGA (transient global amnesia)    Past Surgical  History:  Procedure Laterality Date  . CHOLECYSTECTOMY    . COLONOSCOPY    . STAPEDES SURGERY     LEFT EAR   Family History  Problem Relation Age of Onset  . Heart disease Sister   . Dementia Mother   . Hypertension Other   . Colon cancer Neg Hx   . Stomach cancer Neg Hx   . Esophageal cancer Neg Hx   . Rectal cancer Neg Hx    History  Sexual Activity  . Sexual activity: Yes    Outpatient Encounter Prescriptions as of 09/23/2016  Medication Sig  . alfuzosin (UROXATRAL) 10 MG 24 hr tablet Take 1 tablet (10 mg total) by mouth daily with breakfast.  . amLODipine (NORVASC) 5 MG tablet Take 0.5 tablets (2.5 mg total) by mouth daily.  Marland Kitchen aspirin 81 MG tablet Take 162 mg by mouth daily.  Marland Kitchen atorvastatin (LIPITOR) 20 MG tablet Take 1 tablet (20 mg total) by mouth daily.  . cholecalciferol (VITAMIN D) 1000 UNITS tablet Take 2,000 Units by mouth daily.    . clotrimazole-betamethasone (LOTRISONE) cream Apply 1 application topically 2 (two) times daily.  . colchicine 0.6 MG tablet Take 1 tablet (0.6 mg total) by mouth 3 (three) times daily as needed. For gout  . Cyanocobalamin (VITAMIN B  12 PO) Take 1 tablet by mouth daily.    . finasteride (PROSCAR) 5 MG tablet Take 1 tablet (5 mg total) by mouth daily.  Marland Kitchen labetalol (NORMODYNE) 200 MG tablet TAKE 2 TABLETS BY MOUTH 2 TIMES DAILY.  Marland Kitchen levothyroxine (SYNTHROID, LEVOTHROID) 88 MCG tablet TAKE 1 TABLET (88 MCG TOTAL) BY MOUTH DAILY.  Marland Kitchen loperamide (IMODIUM A-D) 2 MG tablet Take 1 tablet (2 mg total) by mouth 4 (four) times daily as needed for diarrhea or loose stools.   No facility-administered encounter medications on file as of 09/23/2016.     Activities of Daily Living In your present state of health, do you have any difficulty performing the following activities: 09/23/2016  Hearing? Y  Vision? N  Difficulty concentrating or making decisions? N  Walking or climbing stairs? N  Dressing or bathing? N  Doing errands, shopping? N  Preparing  Food and eating ? N  Using the Toilet? N  In the past six months, have you accidently leaked urine? N  Do you have problems with loss of bowel control? N  Managing your Medications? N  Managing your Finances? N  Housekeeping or managing your Housekeeping? N  Some recent data might be hidden    Patient Care Team: Plotnikov, Evie Lacks, MD as PCP - General Deboraha Sprang, MD (Cardiology) Ladene Artist, MD (Gastroenterology) Kathrynn Ducking, MD as Consulting Physician (Neurology) Vicie Mutters, MD as Consulting Physician (Otolaryngology)   Assessment:    Physical assessment deferred to PCP.  Exercise Activities and Dietary recommendations Current Exercise Habits: The patient does not participate in regular exercise at present, Exercise limited by: None identified  Diet (meal preparation, eat out, water intake, caffeinated beverages, dairy products, fruits and vegetables): in general, an "unhealthy" diet, on average, 2 meals per day  encouraged patient to increase daily water intake and to supplement with ensure or boost (samples and coupons provided)   Goals    . Improve my nutrition,  increase my water intake,  and drink supplements like ensure or boost.      Fall Risk Fall Risk  09/23/2016 12/22/2015 04/15/2014  Falls in the past year? No No No   Depression Screen PHQ 2/9 Scores 09/23/2016 12/22/2015  PHQ - 2 Score 2 4  PHQ- 9 Score 11 8    Cognitive Function MMSE - Mini Mental State Exam 09/23/2016  Orientation to time 5  Orientation to Place 5  Registration 3  Attention/ Calculation 4  Recall 1  Language- name 2 objects 2  Language- repeat 1  Language- follow 3 step command 3  Language- read & follow direction 1  Write a sentence 1  Copy design 1  Total score 27   Montreal Cognitive Assessment  03/28/2014  Visuospatial/ Executive (0/5) 5  Naming (0/3) 3  Attention: Read list of digits (0/2) 2  Attention: Read list of letters (0/1) 1  Attention: Serial 7  subtraction starting at 100 (0/3) 3  Language: Repeat phrase (0/2) 2  Language : Fluency (0/1) 0  Abstraction (0/2) 2  Delayed Recall (0/5) 5  Orientation (0/6) 6  Total 29       Immunization History  Administered Date(s) Administered  . Influenza Split 02/12/2011, 11/23/2011  . Influenza Whole 11/16/2007, 02/04/2009  . Influenza, High Dose Seasonal PF 12/05/2012, 12/22/2015  . Influenza,inj,Quad PF,36+ Mos 01/01/2014, 10/18/2014  . Pneumococcal Conjugate-13 06/07/2013  . Pneumococcal Polysaccharide-23 12/22/2015  . Tdap 07/25/2010   Screening Tests Health Maintenance  Topic Date Due  .  INFLUENZA VACCINE  09/22/2016  . TETANUS/TDAP  07/24/2020  . PNA vac Low Risk Adult  Completed      Plan:    Start to eat heart healthy diet (full of fruits, vegetables, whole grains, lean protein, water--limit salt, fat, and sugar intake) and increase physical activity as tolerated.  Continue doing brain stimulating activities (puzzles, reading, adult coloring books, staying active) to keep memory sharp.    I have personally reviewed and noted the following in the patient's chart:   . Medical and social history . Use of alcohol, tobacco or illicit drugs  . Current medications and supplements . Functional ability and status . Nutritional status . Physical activity . Advanced directives . List of other physicians . Vitals . Screenings to include cognitive, depression, and falls . Referrals and appointments  In addition, I have reviewed and discussed with patient certain preventive protocols, quality metrics, and best practice recommendations. A written personalized care plan for preventive services as well as general preventive health recommendations were provided to patient.     Michiel Cowboy, RN  09/23/2016  Medical screening examination/treatment/procedure(s) were performed by non-physician practitioner and as supervising physician I was immediately available for  consultation/collaboration. I agree with above. Walker Kehr, MD

## 2016-09-23 NOTE — Patient Instructions (Addendum)
  Start to eat heart healthy diet (full of fruits, vegetables, whole grains, lean protein, water--limit salt, fat, and sugar intake) and increase physical activity as tolerated.  Continue doing brain stimulating activities (puzzles, reading, adult coloring books, staying active) to keep memory sharp.    Kenneth Williams , Thank you for taking time to come for your Medicare Wellness Visit. I appreciate your ongoing commitment to your health goals. Please review the following plan we discussed and let me know if I can assist you in the future.   These are the goals we discussed: Goals    . Improve my nutrritsion increase my water intake and drink supplements like ensur or boost.       This is a list of the screening recommended for you and due dates:  Health Maintenance  Topic Date Due  . Flu Shot  09/22/2016  . Tetanus Vaccine  07/24/2020  . Pneumonia vaccines  Completed

## 2016-09-23 NOTE — Progress Notes (Signed)
Pre visit review using our clinic review tool, if applicable. No additional management support is needed unless otherwise documented below in the visit note. 

## 2016-12-02 ENCOUNTER — Ambulatory Visit (INDEPENDENT_AMBULATORY_CARE_PROVIDER_SITE_OTHER): Payer: PPO | Admitting: Internal Medicine

## 2016-12-02 ENCOUNTER — Encounter: Payer: Self-pay | Admitting: Internal Medicine

## 2016-12-02 VITALS — BP 142/72 | HR 65 | Temp 98.5°F | Ht 73.0 in | Wt 199.0 lb

## 2016-12-02 DIAGNOSIS — M1120 Other chondrocalcinosis, unspecified site: Secondary | ICD-10-CM | POA: Diagnosis not present

## 2016-12-02 DIAGNOSIS — E034 Atrophy of thyroid (acquired): Secondary | ICD-10-CM

## 2016-12-02 DIAGNOSIS — R350 Frequency of micturition: Secondary | ICD-10-CM

## 2016-12-02 DIAGNOSIS — I10 Essential (primary) hypertension: Secondary | ICD-10-CM

## 2016-12-02 DIAGNOSIS — R634 Abnormal weight loss: Secondary | ICD-10-CM

## 2016-12-02 DIAGNOSIS — R413 Other amnesia: Secondary | ICD-10-CM | POA: Diagnosis not present

## 2016-12-02 DIAGNOSIS — R5381 Other malaise: Secondary | ICD-10-CM

## 2016-12-02 DIAGNOSIS — N401 Enlarged prostate with lower urinary tract symptoms: Secondary | ICD-10-CM

## 2016-12-02 DIAGNOSIS — Z23 Encounter for immunization: Secondary | ICD-10-CM

## 2016-12-02 DIAGNOSIS — R453 Demoralization and apathy: Secondary | ICD-10-CM | POA: Diagnosis not present

## 2016-12-02 MED ORDER — VILAZODONE HCL 20 MG PO TABS: 20.0000 mg | ORAL_TABLET | Freq: Every day | ORAL | 6 refills | Status: DC

## 2016-12-02 MED ORDER — AMLODIPINE BESYLATE 2.5 MG PO TABS: 2.5000 mg | ORAL_TABLET | Freq: Every day | ORAL | 11 refills | Status: DC

## 2016-12-02 NOTE — Assessment & Plan Note (Signed)
Will try Viibrid starter pack

## 2016-12-02 NOTE — Assessment & Plan Note (Signed)
Hard to control optimally due to multiple meds side effects Cont meds

## 2016-12-02 NOTE — Progress Notes (Signed)
Subjective:  Patient ID: Kenneth Williams, male    DOB: 1936/01/16  Age: 81 y.o. MRN: [id number]  CC: No chief complaint on file.   HPI Kenneth Williams presents for HTN, ankle swelling/pain, dyslipidemia f/u. C/o chronic cough  Outpatient Medications Prior to Visit  Medication Sig Dispense Refill  . alfuzosin (UROXATRAL) 10 MG 24 hr tablet Take 1 tablet (10 mg total) by mouth daily with breakfast. 30 tablet 11  . amLODipine (NORVASC) 5 MG tablet Take 0.5 tablets (2.5 mg total) by mouth daily. 30 tablet 11  . aspirin 81 MG tablet Take 162 mg by mouth daily.    Marland Kitchen atorvastatin (LIPITOR) 20 MG tablet Take 1 tablet (20 mg total) by mouth daily. 90 tablet 3  . cholecalciferol (VITAMIN D) 1000 UNITS tablet Take 2,000 Units by mouth daily.      . clotrimazole-betamethasone (LOTRISONE) cream Apply 1 application topically 2 (two) times daily. 45 g 1  . colchicine 0.6 MG tablet Take 1 tablet (0.6 mg total) by mouth 3 (three) times daily as needed. For gout 60 tablet 2  . Cyanocobalamin (VITAMIN B 12 PO) Take 1 tablet by mouth daily.      . finasteride (PROSCAR) 5 MG tablet Take 1 tablet (5 mg total) by mouth daily. 100 tablet 3  . labetalol (NORMODYNE) 200 MG tablet TAKE 2 TABLETS BY MOUTH 2 TIMES DAILY. 360 tablet 3  . levothyroxine (SYNTHROID, LEVOTHROID) 88 MCG tablet TAKE 1 TABLET (88 MCG TOTAL) BY MOUTH DAILY. 30 tablet 11  . loperamide (IMODIUM A-D) 2 MG tablet Take 1 tablet (2 mg total) by mouth 4 (four) times daily as needed for diarrhea or loose stools. 30 tablet 0   No facility-administered medications prior to visit.     ROS Review of Systems  Constitutional: Negative for appetite change, fatigue and unexpected weight change.  HENT: Negative for congestion, nosebleeds, sneezing, sore throat and trouble swallowing.   Eyes: Negative for itching and visual disturbance.  Respiratory: Negative for cough.   Cardiovascular: Positive for leg swelling. Negative for chest pain and palpitations.   Gastrointestinal: Negative for abdominal distention, blood in stool, diarrhea and nausea.  Genitourinary: Negative for frequency and hematuria.  Musculoskeletal: Positive for arthralgias. Negative for back pain, gait problem, joint swelling and neck pain.  Skin: Negative for rash.  Neurological: Negative for dizziness, tremors, speech difficulty and weakness.  Psychiatric/Behavioral: Negative for agitation, dysphoric mood and sleep disturbance. The patient is not nervous/anxious.     Objective:  BP (!) 142/72 (BP Location: Left Arm, Patient Position: Sitting, Cuff Size: Large)   Pulse 65   Temp 98.5 F (36.9 C) (Oral)   Ht 6\' 1"  (1.854 m)   Wt 199 lb (90.3 kg)   SpO2 98%   BMI 26.25 kg/m   BP Readings from Last 3 Encounters:  12/02/16 (!) 142/72  09/23/16 138/85  09/01/16 (!) 142/86    Wt Readings from Last 3 Encounters:  12/02/16 199 lb (90.3 kg)  09/23/16 204 lb (92.5 kg)  09/01/16 206 lb (93.4 kg)    Physical Exam  Constitutional: He is oriented to person, place, and time. He appears well-developed. No distress.  NAD  HENT:  Mouth/Throat: Oropharynx is clear and moist.  Eyes: Pupils are equal, round, and reactive to light. Conjunctivae are normal.  Neck: Normal range of motion. No JVD present. No thyromegaly present.  Cardiovascular: Normal rate, regular rhythm, normal heart sounds and intact distal pulses.  Exam reveals no gallop and no  friction rub.   No murmur heard. Pulmonary/Chest: Effort normal and breath sounds normal. No respiratory distress. He has no wheezes. He has no rales. He exhibits no tenderness.  Abdominal: Soft. Bowel sounds are normal. He exhibits no distension and no mass. There is no tenderness. There is no rebound and no guarding.  Musculoskeletal: Normal range of motion. He exhibits no edema or tenderness.  Lymphadenopathy:    He has no cervical adenopathy.  Neurological: He is alert and oriented to person, place, and time. He has normal  reflexes. No cranial nerve deficit. He exhibits normal muscle tone. He displays a negative Romberg sign. Coordination and gait normal.  Skin: Skin is warm and dry. No rash noted.  Psychiatric: He has a normal mood and affect. His behavior is normal. Judgment and thought content normal.    Lab Results  Component Value Date   WBC 9.6 09/15/2015   HGB 14.4 09/15/2015   HCT 43.0 09/15/2015   PLT 268.0 09/15/2015   GLUCOSE 95 07/21/2016   CHOL 193 03/25/2014   TRIG 314.0 (H) 03/25/2014   HDL 32.80 (L) 03/25/2014   LDLDIRECT 123.0 03/25/2014   LDLCALC 144 (H) 09/12/2013   ALT 23 05/18/2016   AST 17 05/18/2016   NA 139 07/21/2016   K 4.2 07/21/2016   CL 109 07/21/2016   CREATININE 2.00 (H) 07/21/2016   BUN 28 (H) 07/21/2016   CO2 23 07/21/2016   TSH 2.53 05/18/2016   PSA 1.73 05/18/2016   INR 1.1 (H) 09/15/2015    Mm Diag Breast Tomo Bilateral  Result Date: 05/28/2016 CLINICAL DATA:  81 year old presenting with a tender palpable lump behind the left nipple which she initially noted approximately 6 weeks ago. At the same time he noted pigmented lesions on the upper portion of the left areola. Baseline examination. EXAM: 2D DIGITAL DIAGNOSTIC BILATERAL MAMMOGRAM WITH CAD AND ADJUNCT TOMO COMPARISON:  None. ACR Breast Density Category b: There are scattered areas of fibroglandular density. FINDINGS: Standard 2D and tomosynthesis full field CC and MLO views of both breasts were obtained. A standard and tomosynthesis spot-compression view of the area palpable concern behind the left nipple was also obtained. Mild gynecomastia is present on the left. There is no evidence of right gynecomastia. A skin lesion is noted in the far inner portion of the right breast. Mammographic images were processed with CAD. On physical examination, the tissues behind the left nipple are soft and I do not palpate a discrete mass. There are multiple pigmented raised lesions involving the superior left areola. There is  a skin lesion in the far inner right breast corresponding to the mammographic finding. IMPRESSION: 1. Mild left gynecomastia. 2. No evidence of right gynecomastia. 3. No mammographic evidence of malignancy involving either breast. RECOMMENDATION: 1. No further imaging follow-up is felt necessary unless there are persistent or intervening clinical concerns. 2. Dermatology consultation may be considered for the skin lesions on the areola since they are new. I discussed with the patient the fact that gynecomastia can occur in men as testosterone levels decrease with age or in younger men with low testosterone levels, causing a change in the serum testosterone:estrogen ratio. I also discussed the fact that there are numerous medications which are associated with gynecomastia and I note that the patient currently takes spironolactone which is associated with gynecomastia. Approximately 20% of cases of gynecomastia are idiopathic. I counseled the patient to perform self-examination to make sure that a hard lump does not form which could indicate malignancy  and would need further evaluation. We also discussed the possibility of surgical excision if symptoms continue and if an etiology of the gynecomastia cannot be determined and therefore corrected. I have discussed the findings and recommendations with the patient. Results were also provided in writing at the conclusion of the visit. BI-RADS CATEGORY  2: Benign. Electronically Signed   By: Evangeline Dakin M.D.   On: 05/28/2016 10:49    Assessment & Plan:   There are no diagnoses linked to this encounter. I am having Mr. Stannard maintain his Cyanocobalamin (VITAMIN B 12 PO), cholecalciferol, aspirin, clotrimazole-betamethasone, loperamide, labetalol, levothyroxine, finasteride, alfuzosin, colchicine, atorvastatin, and amLODipine.  No orders of the defined types were placed in this encounter.    Follow-up: No Follow-up on file.  Walker Kehr, MD

## 2016-12-02 NOTE — Assessment & Plan Note (Signed)
Labs

## 2016-12-02 NOTE — Assessment & Plan Note (Signed)
Motivate yourself to exercise

## 2016-12-02 NOTE — Assessment & Plan Note (Signed)
NSAIDs prn Colchicine prn

## 2016-12-02 NOTE — Assessment & Plan Note (Signed)
Uroxatral 

## 2016-12-02 NOTE — Assessment & Plan Note (Signed)
Better after hearing aids

## 2016-12-02 NOTE — Assessment & Plan Note (Signed)
Wt Readings from Last 3 Encounters:  12/02/16 199 lb (90.3 kg)  09/23/16 204 lb (92.5 kg)  09/01/16 206 lb (93.4 kg)

## 2016-12-02 NOTE — Addendum Note (Signed)
Addended by: Karren Cobble on: 12/02/2016 01:11 PM   Modules accepted: Orders

## 2017-01-12 ENCOUNTER — Encounter: Payer: Self-pay | Admitting: Internal Medicine

## 2017-01-12 ENCOUNTER — Ambulatory Visit: Payer: PPO | Admitting: Internal Medicine

## 2017-01-12 DIAGNOSIS — I10 Essential (primary) hypertension: Secondary | ICD-10-CM | POA: Diagnosis not present

## 2017-01-12 DIAGNOSIS — R350 Frequency of micturition: Secondary | ICD-10-CM

## 2017-01-12 DIAGNOSIS — R972 Elevated prostate specific antigen [PSA]: Secondary | ICD-10-CM

## 2017-01-12 DIAGNOSIS — N401 Enlarged prostate with lower urinary tract symptoms: Secondary | ICD-10-CM | POA: Diagnosis not present

## 2017-01-12 DIAGNOSIS — E034 Atrophy of thyroid (acquired): Secondary | ICD-10-CM

## 2017-01-12 DIAGNOSIS — R197 Diarrhea, unspecified: Secondary | ICD-10-CM | POA: Diagnosis not present

## 2017-01-12 DIAGNOSIS — K909 Intestinal malabsorption, unspecified: Secondary | ICD-10-CM

## 2017-01-12 DIAGNOSIS — E785 Hyperlipidemia, unspecified: Secondary | ICD-10-CM

## 2017-01-12 NOTE — Assessment & Plan Note (Signed)
BPH w/sx's. Pt refused a Urology referral Proscar

## 2017-01-12 NOTE — Assessment & Plan Note (Signed)
On Levothroid 

## 2017-01-12 NOTE — Assessment & Plan Note (Addendum)
BPH w/sx's. Pt refused a Urology referral Proscar Uroxatral

## 2017-01-12 NOTE — Progress Notes (Signed)
Subjective:  Patient ID: Kenneth Williams, male    DOB: 05-10-1935  Age: 81 y.o. MRN: [id number]  CC: No chief complaint on file.   HPI Kenneth Williams presents for a 6 week f/u of HTN, dyslipidemia, depression f/u. Audry Pili is in the rehab center now. Pt stopped Lipitor - diarrhea stopped and then came back again... Off Lipitor now  Outpatient Medications Prior to Visit  Medication Sig Dispense Refill  . alfuzosin (UROXATRAL) 10 MG 24 hr tablet Take 1 tablet (10 mg total) by mouth daily with breakfast. 30 tablet 11  . amLODipine (NORVASC) 2.5 MG tablet Take 1 tablet (2.5 mg total) by mouth daily. 30 tablet 11  . aspirin 81 MG tablet Take 162 mg by mouth daily.    Marland Kitchen atorvastatin (LIPITOR) 20 MG tablet Take 1 tablet (20 mg total) by mouth daily. 90 tablet 3  . cholecalciferol (VITAMIN D) 1000 UNITS tablet Take 2,000 Units by mouth daily.      . clotrimazole-betamethasone (LOTRISONE) cream Apply 1 application topically 2 (two) times daily. 45 g 1  . colchicine 0.6 MG tablet Take 1 tablet (0.6 mg total) by mouth 3 (three) times daily as needed. For gout 60 tablet 2  . Cyanocobalamin (VITAMIN B 12 PO) Take 1 tablet by mouth daily.      . finasteride (PROSCAR) 5 MG tablet Take 1 tablet (5 mg total) by mouth daily. 100 tablet 3  . labetalol (NORMODYNE) 200 MG tablet TAKE 2 TABLETS BY MOUTH 2 TIMES DAILY. 360 tablet 3  . levothyroxine (SYNTHROID, LEVOTHROID) 88 MCG tablet TAKE 1 TABLET (88 MCG TOTAL) BY MOUTH DAILY. 30 tablet 11  . loperamide (IMODIUM A-D) 2 MG tablet Take 1 tablet (2 mg total) by mouth 4 (four) times daily as needed for diarrhea or loose stools. 30 tablet 0  . Vilazodone HCl (VIIBRYD) 20 MG TABS Take 1 tablet (20 mg total) by mouth daily. 30 tablet 6   No facility-administered medications prior to visit.     ROS Review of Systems  Constitutional: Positive for fatigue. Negative for appetite change and unexpected weight change.  HENT: Negative for congestion, nosebleeds,  sneezing, sore throat and trouble swallowing.   Eyes: Negative for itching and visual disturbance.  Respiratory: Negative for cough.   Cardiovascular: Negative for chest pain, palpitations and leg swelling.  Gastrointestinal: Negative for abdominal distention, blood in stool, diarrhea and nausea.  Genitourinary: Negative for frequency and hematuria.  Musculoskeletal: Negative for back pain, gait problem, joint swelling and neck pain.  Skin: Negative for rash.  Neurological: Negative for dizziness, tremors, speech difficulty and weakness.  Psychiatric/Behavioral: Positive for decreased concentration. Negative for agitation, dysphoric mood, sleep disturbance and suicidal ideas. The patient is not nervous/anxious.     Objective:  BP (!) 168/88 (BP Location: Left Arm, Patient Position: Sitting, Cuff Size: Large)   Pulse 68   Temp 97.8 F (36.6 C) (Oral)   Ht 6\' 1"  (1.854 m)   Wt 203 lb (92.1 kg)   SpO2 99%   BMI 26.78 kg/m   BP Readings from Last 3 Encounters:  01/12/17 (!) 168/88  12/02/16 (!) 142/72  09/23/16 138/85    Wt Readings from Last 3 Encounters:  01/12/17 203 lb (92.1 kg)  12/02/16 199 lb (90.3 kg)  09/23/16 204 lb (92.5 kg)    Physical Exam  Constitutional: He is oriented to person, place, and time. He appears well-developed. No distress.  NAD  HENT:  Mouth/Throat: Oropharynx is clear and moist.  Eyes: Conjunctivae are normal. Pupils are equal, round, and reactive to light.  Neck: Normal range of motion. No JVD present. No thyromegaly present.  Cardiovascular: Normal rate, regular rhythm, normal heart sounds and intact distal pulses. Exam reveals no gallop and no friction rub.  No murmur heard. Pulmonary/Chest: Effort normal and breath sounds normal. No respiratory distress. He has no wheezes. He has no rales. He exhibits no tenderness.  Abdominal: Soft. Bowel sounds are normal. He exhibits no distension and no mass. There is no tenderness. There is no rebound and  no guarding.  Musculoskeletal: Normal range of motion. He exhibits no edema or tenderness.  Lymphadenopathy:    He has no cervical adenopathy.  Neurological: He is alert and oriented to person, place, and time. He has normal reflexes. No cranial nerve deficit. He exhibits normal muscle tone. He displays a negative Romberg sign. Coordination and gait normal.  Skin: Skin is warm and dry. No rash noted.  Psychiatric: He has a normal mood and affect. His behavior is normal. Judgment and thought content normal.    Lab Results  Component Value Date   WBC 9.6 09/15/2015   HGB 14.4 09/15/2015   HCT 43.0 09/15/2015   PLT 268.0 09/15/2015   GLUCOSE 95 07/21/2016   CHOL 193 03/25/2014   TRIG 314.0 (H) 03/25/2014   HDL 32.80 (L) 03/25/2014   LDLDIRECT 123.0 03/25/2014   LDLCALC 144 (H) 09/12/2013   ALT 23 05/18/2016   AST 17 05/18/2016   NA 139 07/21/2016   K 4.2 07/21/2016   CL 109 07/21/2016   CREATININE 2.00 (H) 07/21/2016   BUN 28 (H) 07/21/2016   CO2 23 07/21/2016   TSH 2.53 05/18/2016   PSA 1.73 05/18/2016   INR 1.1 (H) 09/15/2015    Mm Diag Breast Tomo Bilateral  Result Date: 05/28/2016 CLINICAL DATA:  81 year old presenting with a tender palpable lump behind the left nipple which she initially noted approximately 6 weeks ago. At the same time he noted pigmented lesions on the upper portion of the left areola. Baseline examination. EXAM: 2D DIGITAL DIAGNOSTIC BILATERAL MAMMOGRAM WITH CAD AND ADJUNCT TOMO COMPARISON:  None. ACR Breast Density Category b: There are scattered areas of fibroglandular density. FINDINGS: Standard 2D and tomosynthesis full field CC and MLO views of both breasts were obtained. A standard and tomosynthesis spot-compression view of the area palpable concern behind the left nipple was also obtained. Mild gynecomastia is present on the left. There is no evidence of right gynecomastia. A skin lesion is noted in the far inner portion of the right breast. Mammographic  images were processed with CAD. On physical examination, the tissues behind the left nipple are soft and I do not palpate a discrete mass. There are multiple pigmented raised lesions involving the superior left areola. There is a skin lesion in the far inner right breast corresponding to the mammographic finding. IMPRESSION: 1. Mild left gynecomastia. 2. No evidence of right gynecomastia. 3. No mammographic evidence of malignancy involving either breast. RECOMMENDATION: 1. No further imaging follow-up is felt necessary unless there are persistent or intervening clinical concerns. 2. Dermatology consultation may be considered for the skin lesions on the areola since they are new. I discussed with the patient the fact that gynecomastia can occur in men as testosterone levels decrease with age or in younger men with low testosterone levels, causing a change in the serum testosterone:estrogen ratio. I also discussed the fact that there are numerous medications which are associated with gynecomastia and  I note that the patient currently takes spironolactone which is associated with gynecomastia. Approximately 20% of cases of gynecomastia are idiopathic. I counseled the patient to perform self-examination to make sure that a hard lump does not form which could indicate malignancy and would need further evaluation. We also discussed the possibility of surgical excision if symptoms continue and if an etiology of the gynecomastia cannot be determined and therefore corrected. I have discussed the findings and recommendations with the patient. Results were also provided in writing at the conclusion of the visit. BI-RADS CATEGORY  2: Benign. Electronically Signed   By: Evangeline Dakin M.D.   On: 05/28/2016 10:49    Assessment & Plan:   There are no diagnoses linked to this encounter. I am having Cotton L. Barra maintain his Cyanocobalamin (VITAMIN B 12 PO), cholecalciferol, aspirin, clotrimazole-betamethasone,  loperamide, labetalol, levothyroxine, finasteride, alfuzosin, colchicine, atorvastatin, Vilazodone HCl, and amLODipine.  No orders of the defined types were placed in this encounter.    Follow-up: No Follow-up on file.  Walker Kehr, MD

## 2017-01-12 NOTE — Assessment & Plan Note (Addendum)
Labetalol, Norvasc - increase to 5 mg/d

## 2017-01-12 NOTE — Assessment & Plan Note (Signed)
Stopped Lipitor - diarrhea stopped and then came back again... Off Lipitor now

## 2017-01-12 NOTE — Assessment & Plan Note (Signed)
Stopped Lipitor - diarrhea stopped and then came back again.Kenneth KitchenMarland Williams

## 2017-01-30 ENCOUNTER — Other Ambulatory Visit: Payer: Self-pay | Admitting: Internal Medicine

## 2017-03-02 ENCOUNTER — Ambulatory Visit: Payer: PPO | Admitting: Internal Medicine

## 2017-03-03 ENCOUNTER — Encounter: Payer: Self-pay | Admitting: Internal Medicine

## 2017-03-03 ENCOUNTER — Other Ambulatory Visit (INDEPENDENT_AMBULATORY_CARE_PROVIDER_SITE_OTHER): Payer: PPO

## 2017-03-03 ENCOUNTER — Ambulatory Visit (INDEPENDENT_AMBULATORY_CARE_PROVIDER_SITE_OTHER): Payer: PPO | Admitting: Internal Medicine

## 2017-03-03 VITALS — BP 134/82 | HR 65 | Temp 97.8°F | Ht 73.0 in | Wt 197.0 lb

## 2017-03-03 DIAGNOSIS — I951 Orthostatic hypotension: Secondary | ICD-10-CM | POA: Diagnosis not present

## 2017-03-03 DIAGNOSIS — E739 Lactose intolerance, unspecified: Secondary | ICD-10-CM

## 2017-03-03 DIAGNOSIS — R0609 Other forms of dyspnea: Secondary | ICD-10-CM

## 2017-03-03 DIAGNOSIS — R0600 Dyspnea, unspecified: Secondary | ICD-10-CM

## 2017-03-03 HISTORY — DX: Other forms of dyspnea: R06.09

## 2017-03-03 HISTORY — DX: Orthostatic hypotension: I95.1

## 2017-03-03 HISTORY — DX: Lactose intolerance, unspecified: E73.9

## 2017-03-03 HISTORY — DX: Dyspnea, unspecified: R06.00

## 2017-03-03 LAB — CBC WITH DIFFERENTIAL/PLATELET
BASOS PCT: 0.4 % (ref 0.0–3.0)
Basophils Absolute: 0 10*3/uL (ref 0.0–0.1)
EOS ABS: 0.2 10*3/uL (ref 0.0–0.7)
Eosinophils Relative: 2.6 % (ref 0.0–5.0)
HEMATOCRIT: 43.5 % (ref 39.0–52.0)
Hemoglobin: 14.1 g/dL (ref 13.0–17.0)
LYMPHS PCT: 14.8 % (ref 12.0–46.0)
Lymphs Abs: 1.4 10*3/uL (ref 0.7–4.0)
MCHC: 32.4 g/dL (ref 30.0–36.0)
MCV: 87.7 fl (ref 78.0–100.0)
Monocytes Absolute: 0.7 10*3/uL (ref 0.1–1.0)
Monocytes Relative: 8 % (ref 3.0–12.0)
NEUTROS ABS: 7 10*3/uL (ref 1.4–7.7)
Neutrophils Relative %: 74.2 % (ref 43.0–77.0)
PLATELETS: 253 10*3/uL (ref 150.0–400.0)
RBC: 4.96 Mil/uL (ref 4.22–5.81)
RDW: 13.2 % (ref 11.5–15.5)
WBC: 9.4 10*3/uL (ref 4.0–10.5)

## 2017-03-03 LAB — BASIC METABOLIC PANEL
BUN: 27 mg/dL — ABNORMAL HIGH (ref 6–23)
CALCIUM: 9.3 mg/dL (ref 8.4–10.5)
CO2: 25 mEq/L (ref 19–32)
CREATININE: 2.26 mg/dL — AB (ref 0.40–1.50)
Chloride: 108 mEq/L (ref 96–112)
GFR: 29.85 mL/min — AB (ref >60.00)
Glucose, Bld: 102 mg/dL — ABNORMAL HIGH (ref 70–99)
Potassium: 4.8 mEq/L (ref 3.5–5.1)
Sodium: 141 mEq/L (ref 135–145)

## 2017-03-03 LAB — URINALYSIS, ROUTINE W REFLEX MICROSCOPIC
Bilirubin Urine: NEGATIVE
Hgb urine dipstick: NEGATIVE
KETONES UR: NEGATIVE
Leukocytes, UA: NEGATIVE
Nitrite: NEGATIVE
PH: 6 (ref 5.0–8.0)
RBC / HPF: NONE SEEN (ref 0–?)
SPECIFIC GRAVITY, URINE: 1.025 (ref 1.000–1.030)
Total Protein, Urine: >=300 — AB
URINE GLUCOSE: NEGATIVE
UROBILINOGEN UA: 0.2 (ref 0.0–1.0)

## 2017-03-03 LAB — HEPATIC FUNCTION PANEL
ALK PHOS: 84 U/L (ref 39–117)
ALT: 16 U/L (ref 0–53)
AST: 12 U/L (ref 0–37)
Albumin: 3.8 g/dL (ref 3.5–5.2)
BILIRUBIN DIRECT: 0.1 mg/dL (ref 0.0–0.3)
TOTAL PROTEIN: 6.8 g/dL (ref 6.0–8.3)
Total Bilirubin: 0.6 mg/dL (ref 0.2–1.2)

## 2017-03-03 LAB — CORTISOL: Cortisol, Plasma: 9.9 ug/dL

## 2017-03-03 NOTE — Assessment & Plan Note (Signed)
Avoid milk

## 2017-03-03 NOTE — Assessment & Plan Note (Signed)
EKG Recent diarrhea resolved Labs Reduce Labetalol down to 1 tab twice a day Drink more water

## 2017-03-03 NOTE — Progress Notes (Signed)
Subjective:  Patient ID: Kenneth Williams, male    DOB: Dec 27, 1935  Age: 82 y.o. MRN: [id number]  CC: No chief complaint on file.   HPI Kenneth Williams presents for c/o fatigue x 1 week. C/o heaviness in the neck and shoulders. No CP. Some SOB. C/o lightheadedness. No n/v. Had a bad diarrhea recently  Outpatient Medications Prior to Visit  Medication Sig Dispense Refill  . alfuzosin (UROXATRAL) 10 MG 24 hr tablet Take 1 tablet (10 mg total) by mouth daily with breakfast. 30 tablet 11  . amLODipine (NORVASC) 2.5 MG tablet Take 1 tablet (2.5 mg total) by mouth daily. 30 tablet 11  . aspirin 81 MG tablet Take 162 mg by mouth daily.    . cholecalciferol (VITAMIN D) 1000 UNITS tablet Take 2,000 Units by mouth daily.      . clotrimazole-betamethasone (LOTRISONE) cream Apply 1 application topically 2 (two) times daily. 45 g 1  . colchicine 0.6 MG tablet Take 1 tablet (0.6 mg total) by mouth 3 (three) times daily as needed. For gout 60 tablet 2  . Cyanocobalamin (VITAMIN B 12 PO) Take 1 tablet by mouth daily.      . finasteride (PROSCAR) 5 MG tablet Take 1 tablet (5 mg total) by mouth daily. 100 tablet 3  . labetalol (NORMODYNE) 200 MG tablet TAKE 2 TABLETS BY MOUTH 2 TIMES DAILY. 360 tablet 3  . levothyroxine (SYNTHROID, LEVOTHROID) 88 MCG tablet TAKE 1 TABLET (88 MCG TOTAL) BY MOUTH DAILY. 30 tablet 11  . loperamide (IMODIUM A-D) 2 MG tablet Take 1 tablet (2 mg total) by mouth 4 (four) times daily as needed for diarrhea or loose stools. 30 tablet 0  . Vilazodone HCl (VIIBRYD) 20 MG TABS Take 1 tablet (20 mg total) by mouth daily. 30 tablet 6   No facility-administered medications prior to visit.     ROS Review of Systems  Constitutional: Positive for fatigue. Negative for appetite change and unexpected weight change.  HENT: Negative for congestion, nosebleeds, sneezing, sore throat and trouble swallowing.   Eyes: Negative for itching and visual disturbance.  Respiratory: Positive for  shortness of breath. Negative for cough.   Cardiovascular: Negative for chest pain, palpitations and leg swelling.  Gastrointestinal: Negative for abdominal distention, blood in stool, diarrhea and nausea.  Genitourinary: Negative for frequency and hematuria.  Musculoskeletal: Negative for back pain, gait problem, joint swelling and neck pain.  Skin: Negative for rash.  Neurological: Positive for weakness. Negative for dizziness, tremors and speech difficulty.  Psychiatric/Behavioral: Negative for agitation, dysphoric mood and sleep disturbance. The patient is not nervous/anxious.     Objective:  BP 134/82 (BP Location: Left Arm, Patient Position: Sitting, Cuff Size: Normal)   Pulse 65   Temp 97.8 F (36.6 C) (Oral)   Ht 6\' 1"  (1.854 m)   Wt 197 lb (89.4 kg)   SpO2 98%   BMI 25.99 kg/m   BP Readings from Last 3 Encounters:  03/03/17 134/82  01/12/17 (!) 168/88  12/02/16 (!) 142/72    Wt Readings from Last 3 Encounters:  03/03/17 197 lb (89.4 kg)  01/12/17 203 lb (92.1 kg)  12/02/16 199 lb (90.3 kg)    Physical Exam  Constitutional: He is oriented to person, place, and time. He appears well-developed. No distress.  NAD  HENT:  Mouth/Throat: Oropharynx is clear and moist.  Eyes: Conjunctivae are normal. Pupils are equal, round, and reactive to light.  Neck: Normal range of motion. No JVD present. No thyromegaly  present.  Cardiovascular: Normal rate, regular rhythm, normal heart sounds and intact distal pulses. Exam reveals no gallop and no friction rub.  No murmur heard. Pulmonary/Chest: Effort normal and breath sounds normal. No respiratory distress. He has no wheezes. He has no rales. He exhibits no tenderness.  Abdominal: Soft. Bowel sounds are normal. He exhibits no distension and no mass. There is no tenderness. There is no rebound and no guarding.  Musculoskeletal: Normal range of motion. He exhibits no edema or tenderness.  Lymphadenopathy:    He has no cervical  adenopathy.  Neurological: He is alert and oriented to person, place, and time. He has normal reflexes. No cranial nerve deficit. He exhibits normal muscle tone. He displays a negative Romberg sign. Coordination and gait normal.  Skin: Skin is warm and dry. No rash noted.  Psychiatric: He has a normal mood and affect. His behavior is normal. Judgment and thought content normal.   Procedure: EKG Indication: SOB Impression: NSR. No acute changes.  Lab Results  Component Value Date   WBC 9.6 09/15/2015   HGB 14.4 09/15/2015   HCT 43.0 09/15/2015   PLT 268.0 09/15/2015   GLUCOSE 95 07/21/2016   CHOL 193 03/25/2014   TRIG 314.0 (H) 03/25/2014   HDL 32.80 (L) 03/25/2014   LDLDIRECT 123.0 03/25/2014   LDLCALC 144 (H) 09/12/2013   ALT 23 05/18/2016   AST 17 05/18/2016   NA 139 07/21/2016   K 4.2 07/21/2016   CL 109 07/21/2016   CREATININE 2.00 (H) 07/21/2016   BUN 28 (H) 07/21/2016   CO2 23 07/21/2016   TSH 2.53 05/18/2016   PSA 1.73 05/18/2016   INR 1.1 (H) 09/15/2015    Mm Diag Breast Tomo Bilateral  Result Date: 05/28/2016 CLINICAL DATA:  82 year old presenting with a tender palpable lump behind the left nipple which she initially noted approximately 6 weeks ago. At the same time he noted pigmented lesions on the upper portion of the left areola. Baseline examination. EXAM: 2D DIGITAL DIAGNOSTIC BILATERAL MAMMOGRAM WITH CAD AND ADJUNCT TOMO COMPARISON:  None. ACR Breast Density Category b: There are scattered areas of fibroglandular density. FINDINGS: Standard 2D and tomosynthesis full field CC and MLO views of both breasts were obtained. A standard and tomosynthesis spot-compression view of the area palpable concern behind the left nipple was also obtained. Mild gynecomastia is present on the left. There is no evidence of right gynecomastia. A skin lesion is noted in the far inner portion of the right breast. Mammographic images were processed with CAD. On physical examination, the  tissues behind the left nipple are soft and I do not palpate a discrete mass. There are multiple pigmented raised lesions involving the superior left areola. There is a skin lesion in the far inner right breast corresponding to the mammographic finding. IMPRESSION: 1. Mild left gynecomastia. 2. No evidence of right gynecomastia. 3. No mammographic evidence of malignancy involving either breast. RECOMMENDATION: 1. No further imaging follow-up is felt necessary unless there are persistent or intervening clinical concerns. 2. Dermatology consultation may be considered for the skin lesions on the areola since they are new. I discussed with the patient the fact that gynecomastia can occur in men as testosterone levels decrease with age or in younger men with low testosterone levels, causing a change in the serum testosterone:estrogen ratio. I also discussed the fact that there are numerous medications which are associated with gynecomastia and I note that the patient currently takes spironolactone which is associated with gynecomastia. Approximately  20% of cases of gynecomastia are idiopathic. I counseled the patient to perform self-examination to make sure that a hard lump does not form which could indicate malignancy and would need further evaluation. We also discussed the possibility of surgical excision if symptoms continue and if an etiology of the gynecomastia cannot be determined and therefore corrected. I have discussed the findings and recommendations with the patient. Results were also provided in writing at the conclusion of the visit. BI-RADS CATEGORY  2: Benign. Electronically Signed   By: Evangeline Dakin M.D.   On: 05/28/2016 10:49    Assessment & Plan:   There are no diagnoses linked to this encounter. I am having Saben L. Freiberger maintain his Cyanocobalamin (VITAMIN B 12 PO), cholecalciferol, aspirin, clotrimazole-betamethasone, loperamide, levothyroxine, finasteride, alfuzosin, colchicine,  Vilazodone HCl, amLODipine, and labetalol.  No orders of the defined types were placed in this encounter.    Follow-up: No Follow-up on file.  Walker Kehr, MD

## 2017-03-03 NOTE — Assessment & Plan Note (Signed)
Reduce Labetalol down to 1 tab twice a day Drink more water Labs EKG

## 2017-03-03 NOTE — Patient Instructions (Addendum)
Reduce Labetalol down to 1 tab twice a day or hold it completely Drink more water

## 2017-04-01 ENCOUNTER — Ambulatory Visit (INDEPENDENT_AMBULATORY_CARE_PROVIDER_SITE_OTHER): Payer: PPO | Admitting: Internal Medicine

## 2017-04-01 ENCOUNTER — Encounter: Payer: Self-pay | Admitting: Internal Medicine

## 2017-04-01 VITALS — BP 140/80 | HR 62 | Temp 97.6°F | Ht 73.0 in | Wt 192.5 lb

## 2017-04-01 DIAGNOSIS — F332 Major depressive disorder, recurrent severe without psychotic features: Secondary | ICD-10-CM

## 2017-04-01 DIAGNOSIS — G459 Transient cerebral ischemic attack, unspecified: Secondary | ICD-10-CM | POA: Diagnosis not present

## 2017-04-01 DIAGNOSIS — R972 Elevated prostate specific antigen [PSA]: Secondary | ICD-10-CM | POA: Diagnosis not present

## 2017-04-01 DIAGNOSIS — N401 Enlarged prostate with lower urinary tract symptoms: Secondary | ICD-10-CM | POA: Diagnosis not present

## 2017-04-01 DIAGNOSIS — R351 Nocturia: Secondary | ICD-10-CM | POA: Diagnosis not present

## 2017-04-01 DIAGNOSIS — R21 Rash and other nonspecific skin eruption: Secondary | ICD-10-CM | POA: Insufficient documentation

## 2017-04-01 DIAGNOSIS — I10 Essential (primary) hypertension: Secondary | ICD-10-CM

## 2017-04-01 DIAGNOSIS — E034 Atrophy of thyroid (acquired): Secondary | ICD-10-CM

## 2017-04-01 MED ORDER — PITAVASTATIN CALCIUM 1 MG PO TABS: 1.0000 mg | ORAL_TABLET | Freq: Every day | ORAL | 11 refills | Status: DC

## 2017-04-01 MED ORDER — CLOTRIMAZOLE-BETAMETHASONE 1-0.05 % EX CREA: 1.0000 "application " | TOPICAL_CREAM | Freq: Two times a day (BID) | CUTANEOUS | 1 refills | Status: DC

## 2017-04-01 NOTE — Assessment & Plan Note (Signed)
NAS diet Hard to control optimally due to multiple meds side effects Meds reviewed

## 2017-04-01 NOTE — Assessment & Plan Note (Signed)
ASA No relapse

## 2017-04-01 NOTE — Assessment & Plan Note (Signed)
Fungal vs eczema: Lotrizone

## 2017-04-01 NOTE — Assessment & Plan Note (Signed)
Pt stopped Viibrid

## 2017-04-01 NOTE — Assessment & Plan Note (Signed)
Urol ref offered

## 2017-04-01 NOTE — Progress Notes (Signed)
Subjective:  Patient ID: Kenneth Williams, male    DOB: 05/31/1935  Age: 82 y.o. MRN: [id number]  CC: No chief complaint on file.   HPI Kenneth Williams presents for HTN, BPH, depression C/o rash on the L foot  Outpatient Medications Prior to Visit  Medication Sig Dispense Refill  . alfuzosin (UROXATRAL) 10 MG 24 hr tablet Take 1 tablet (10 mg total) by mouth daily with breakfast. 30 tablet 11  . amLODipine (NORVASC) 2.5 MG tablet Take 1 tablet (2.5 mg total) by mouth daily. 30 tablet 11  . aspirin 81 MG tablet Take 162 mg by mouth daily.    . cholecalciferol (VITAMIN D) 1000 UNITS tablet Take 2,000 Units by mouth daily.      . clotrimazole-betamethasone (LOTRISONE) cream Apply 1 application topically 2 (two) times daily. 45 g 1  . colchicine 0.6 MG tablet Take 1 tablet (0.6 mg total) by mouth 3 (three) times daily as needed. For gout 60 tablet 2  . Cyanocobalamin (VITAMIN B 12 PO) Take 1 tablet by mouth daily.      . finasteride (PROSCAR) 5 MG tablet Take 1 tablet (5 mg total) by mouth daily. 100 tablet 3  . labetalol (NORMODYNE) 200 MG tablet TAKE 2 TABLETS BY MOUTH 2 TIMES DAILY. 360 tablet 3  . levothyroxine (SYNTHROID, LEVOTHROID) 88 MCG tablet TAKE 1 TABLET (88 MCG TOTAL) BY MOUTH DAILY. 30 tablet 11  . loperamide (IMODIUM A-D) 2 MG tablet Take 1 tablet (2 mg total) by mouth 4 (four) times daily as needed for diarrhea or loose stools. 30 tablet 0  . Vilazodone HCl (VIIBRYD) 20 MG TABS Take 1 tablet (20 mg total) by mouth daily. 30 tablet 6   No facility-administered medications prior to visit.     ROS Review of Systems  Constitutional: Positive for fatigue. Negative for appetite change and unexpected weight change.  HENT: Negative for congestion, nosebleeds, sneezing, sore throat and trouble swallowing.   Eyes: Negative for itching and visual disturbance.  Respiratory: Negative for cough.   Cardiovascular: Negative for chest pain, palpitations and leg swelling.    Gastrointestinal: Negative for abdominal distention, blood in stool, diarrhea and nausea.  Genitourinary: Negative for frequency and hematuria.  Musculoskeletal: Negative for back pain, gait problem, joint swelling and neck pain.  Skin: Positive for rash.  Neurological: Negative for dizziness, tremors, speech difficulty and weakness.  Psychiatric/Behavioral: Negative for agitation, dysphoric mood and sleep disturbance. The patient is not nervous/anxious.     Objective:  BP 140/80 (BP Location: Left Arm, Patient Position: Sitting, Cuff Size: Normal)   Pulse 62   Temp 97.6 F (36.4 C) (Oral)   Ht 6\' 1"  (1.854 m)   Wt 192 lb 8 oz (87.3 kg)   SpO2 100%   BMI 25.40 kg/m   BP Readings from Last 3 Encounters:  04/01/17 140/80  03/03/17 134/82  01/12/17 (!) 168/88    Wt Readings from Last 3 Encounters:  04/01/17 192 lb 8 oz (87.3 kg)  03/03/17 197 lb (89.4 kg)  01/12/17 203 lb (92.1 kg)    Physical Exam  Constitutional: He is oriented to person, place, and time. He appears well-developed. No distress.  NAD  HENT:  Mouth/Throat: Oropharynx is clear and moist.  Eyes: Conjunctivae are normal. Pupils are equal, round, and reactive to light.  Neck: Normal range of motion. No JVD present. No thyromegaly present.  Cardiovascular: Normal rate, regular rhythm, normal heart sounds and intact distal pulses. Exam reveals no gallop and  no friction rub.  No murmur heard. Pulmonary/Chest: Effort normal and breath sounds normal. No respiratory distress. He has no wheezes. He has no rales. He exhibits no tenderness.  Abdominal: Soft. Bowel sounds are normal. He exhibits no distension and no mass. There is no tenderness. There is no rebound and no guarding.  Musculoskeletal: Normal range of motion. He exhibits no edema or tenderness.  Lymphadenopathy:    He has no cervical adenopathy.  Neurological: He is alert and oriented to person, place, and time. He has normal reflexes. No cranial nerve  deficit. He exhibits normal muscle tone. He displays a negative Romberg sign. Coordination and gait normal.  Skin: Skin is warm and dry. Rash noted.  Psychiatric: He has a normal mood and affect. His behavior is normal. Judgment and thought content normal.  round patch on L lat foot - scaly   Lab Results  Component Value Date   WBC 9.4 03/03/2017   HGB 14.1 03/03/2017   HCT 43.5 03/03/2017   PLT 253.0 03/03/2017   GLUCOSE 102 (H) 03/03/2017   CHOL 193 03/25/2014   TRIG 314.0 (H) 03/25/2014   HDL 32.80 (L) 03/25/2014   LDLDIRECT 123.0 03/25/2014   LDLCALC 144 (H) 09/12/2013   ALT 16 03/03/2017   AST 12 03/03/2017   NA 141 03/03/2017   K 4.8 03/03/2017   CL 108 03/03/2017   CREATININE 2.26 (H) 03/03/2017   BUN 27 (H) 03/03/2017   CO2 25 03/03/2017   TSH 2.53 05/18/2016   PSA 1.73 05/18/2016   INR 1.1 (H) 09/15/2015    Mm Diag Breast Tomo Bilateral  Result Date: 05/28/2016 CLINICAL DATA:  82 year old presenting with a tender palpable lump behind the left nipple which she initially noted approximately 6 weeks ago. At the same time he noted pigmented lesions on the upper portion of the left areola. Baseline examination. EXAM: 2D DIGITAL DIAGNOSTIC BILATERAL MAMMOGRAM WITH CAD AND ADJUNCT TOMO COMPARISON:  None. ACR Breast Density Category b: There are scattered areas of fibroglandular density. FINDINGS: Standard 2D and tomosynthesis full field CC and MLO views of both breasts were obtained. A standard and tomosynthesis spot-compression view of the area palpable concern behind the left nipple was also obtained. Mild gynecomastia is present on the left. There is no evidence of right gynecomastia. A skin lesion is noted in the far inner portion of the right breast. Mammographic images were processed with CAD. On physical examination, the tissues behind the left nipple are soft and I do not palpate a discrete mass. There are multiple pigmented raised lesions involving the superior left areola.  There is a skin lesion in the far inner right breast corresponding to the mammographic finding. IMPRESSION: 1. Mild left gynecomastia. 2. No evidence of right gynecomastia. 3. No mammographic evidence of malignancy involving either breast. RECOMMENDATION: 1. No further imaging follow-up is felt necessary unless there are persistent or intervening clinical concerns. 2. Dermatology consultation may be considered for the skin lesions on the areola since they are new. I discussed with the patient the fact that gynecomastia can occur in men as testosterone levels decrease with age or in younger men with low testosterone levels, causing a change in the serum testosterone:estrogen ratio. I also discussed the fact that there are numerous medications which are associated with gynecomastia and I note that the patient currently takes spironolactone which is associated with gynecomastia. Approximately 20% of cases of gynecomastia are idiopathic. I counseled the patient to perform self-examination to make sure that a hard  lump does not form which could indicate malignancy and would need further evaluation. We also discussed the possibility of surgical excision if symptoms continue and if an etiology of the gynecomastia cannot be determined and therefore corrected. I have discussed the findings and recommendations with the patient. Results were also provided in writing at the conclusion of the visit. BI-RADS CATEGORY  2: Benign. Electronically Signed   By: Evangeline Dakin M.D.   On: 05/28/2016 10:49    Assessment & Plan:   There are no diagnoses linked to this encounter. I am having Kenneth Williams maintain his Cyanocobalamin (VITAMIN B 12 PO), cholecalciferol, aspirin, clotrimazole-betamethasone, loperamide, levothyroxine, finasteride, alfuzosin, colchicine, Vilazodone HCl, amLODipine, and labetalol.  No orders of the defined types were placed in this encounter.    Follow-up: No Follow-up on file.  Walker Kehr,  MD

## 2017-04-01 NOTE — Assessment & Plan Note (Signed)
Levothroid 

## 2017-04-15 ENCOUNTER — Ambulatory Visit: Payer: PPO | Admitting: Internal Medicine

## 2017-05-06 ENCOUNTER — Other Ambulatory Visit: Payer: Self-pay | Admitting: Internal Medicine

## 2017-05-27 DIAGNOSIS — R3914 Feeling of incomplete bladder emptying: Secondary | ICD-10-CM | POA: Diagnosis not present

## 2017-05-27 DIAGNOSIS — N401 Enlarged prostate with lower urinary tract symptoms: Secondary | ICD-10-CM | POA: Diagnosis not present

## 2017-06-05 ENCOUNTER — Other Ambulatory Visit: Payer: Self-pay | Admitting: Internal Medicine

## 2017-06-06 DIAGNOSIS — N401 Enlarged prostate with lower urinary tract symptoms: Secondary | ICD-10-CM | POA: Diagnosis not present

## 2017-06-06 DIAGNOSIS — R3914 Feeling of incomplete bladder emptying: Secondary | ICD-10-CM | POA: Diagnosis not present

## 2017-06-07 ENCOUNTER — Encounter (HOSPITAL_COMMUNITY): Payer: Self-pay | Admitting: Emergency Medicine

## 2017-06-07 ENCOUNTER — Emergency Department (HOSPITAL_COMMUNITY)
Admission: EM | Admit: 2017-06-07 | Discharge: 2017-06-08 | Disposition: A | Discharge status: Home / Self Care | Payer: PPO | Attending: Emergency Medicine | Admitting: Emergency Medicine

## 2017-06-07 DIAGNOSIS — R338 Other retention of urine: Secondary | ICD-10-CM

## 2017-06-07 DIAGNOSIS — R310 Gross hematuria: Secondary | ICD-10-CM | POA: Diagnosis not present

## 2017-06-07 DIAGNOSIS — R339 Retention of urine, unspecified: Secondary | ICD-10-CM | POA: Diagnosis not present

## 2017-06-07 DIAGNOSIS — R319 Hematuria, unspecified: Secondary | ICD-10-CM | POA: Diagnosis present

## 2017-06-07 LAB — URINALYSIS, ROUTINE W REFLEX MICROSCOPIC
BACTERIA UA: NONE SEEN
SQUAMOUS EPITHELIAL / LPF: NONE SEEN

## 2017-06-07 MED ORDER — HYDROCODONE-ACETAMINOPHEN 5-325 MG PO TABS
1.0000 | ORAL_TABLET | Freq: Once | ORAL | Status: AC
  Administered 2017-06-08: 1 via ORAL
  Filled 2017-06-07: qty 1

## 2017-06-07 MED ORDER — LIDOCAINE HCL 2 % EX GEL
1.0000 "application " | Freq: Once | CUTANEOUS | Status: AC
  Administered 2017-06-07: 1 via URETHRAL
  Filled 2017-06-07: qty 5

## 2017-06-07 MED ORDER — CEPHALEXIN 500 MG PO CAPS
500.0000 mg | ORAL_CAPSULE | Freq: Once | ORAL | Status: AC
  Administered 2017-06-08: 500 mg via ORAL
  Filled 2017-06-07: qty 1

## 2017-06-07 MED ORDER — ACETAMINOPHEN 325 MG PO TABS
650.0000 mg | ORAL_TABLET | Freq: Once | ORAL | Status: AC
Start: 2017-06-07 — End: 2017-06-08
  Administered 2017-06-08: 650 mg via ORAL
  Filled 2017-06-07: qty 2

## 2017-06-07 MED ORDER — CEPHALEXIN 500 MG PO CAPS: 500.0000 mg | ORAL_CAPSULE | Freq: Two times a day (BID) | ORAL | 0 refills | Status: AC

## 2017-06-07 NOTE — ED Notes (Addendum)
Bladder irrigation preformed by RN Sheffield Slider. 25-30 mL. Pt tolerated poorly.100 mL return.

## 2017-06-07 NOTE — ED Triage Notes (Signed)
Patient c/o pain with urination and urinary frequency with blood since 1600 today. Reports he had a cystoscopy at urologist yesterday. Hx enlarged prostate.

## 2017-06-07 NOTE — ED Provider Notes (Signed)
[facility] DEPT Provider Note   CSN: [id number] Arrival date & time: 06/07/17  2002     History   Chief Complaint Chief Complaint  Patient presents with  . Hematuria  . Urinary Frequency    HPI Kenneth Williams is a 82 y.o. male.  HPI  82 year old male with a history of BPH presents with difficulty urinating and hematuria.  Yesterday afternoon he had a cystoscopy by Dr. Diona Fanti.  Since then he has had some pain with urinating and some difficulty urinating but it became much worse around 4 PM.  He is only able to get out small amounts and has severe pain when urinating at his penis.  He denies abdominal pain or vomiting.  No fevers.  He did not notice blood and blood clots until he arrived here.  He takes aspirin but denies any other blood thinners.  Past Medical History:  Diagnosis Date  . BPH (benign prostatic hyperplasia)   . CAD (coronary artery disease)    cath 2011-mild non obstructive  . Elevated PSA    history of   . Gout    2009  . History of diverticulitis of colon   . History of pancreatitis   . History of TIAs   . Hyperlipidemia   . Hypertension   . Hypothyroidism   . Hypothyroidism   . Left ear hearing loss    wears right hearing aid  . Personal history of hyperthyroidism 2006   s/p 131I- Dr. Chalmers Cater, Hyperthyroid  . Renal insufficiency 2011  . TGA (transient global amnesia)     Patient Active Problem List   Diagnosis Date Noted  . Rash 04/01/2017  . Orthostatic hypotension 03/03/2017  . DOE (dyspnea on exertion) 03/03/2017  . Lactose intolerance 03/03/2017  . Gout 07/21/2016  . Dyslipidemia 07/21/2016  . Hoarseness 05/18/2016  . Gynecomastia 05/18/2016  . Fatty liver 02/06/2016  . Elevated serum creatinine 09/16/2015  . Preop exam for internal medicine 09/15/2015  . Physical deconditioning 06/18/2015  . BPH (benign prostatic hyperplasia) 06/02/2015  . Chest pain, atypical 01/27/2015  . Loss of weight 10/18/2014    . Scrotal rash 10/18/2014  . Depression 07/19/2014  . Neck pain on right side 06/24/2014  . Onychomycosis of toenail 04/15/2014  . Wart of scalp 03/10/2014  . Carotid artery stenosis 02/29/2012  . Diarrhea 11/23/2011  . Well adult exam 05/24/2011  . Elevated PSA 05/24/2011  . Cerumen impaction 05/24/2011  . Pseudogout 10/27/2010  . Foot pain, left 09/07/2010  . Neoplasm of uncertain behavior of skin 09/07/2010  . Apathy 09/07/2010  . Edema 07/17/2010  . Acute upper respiratory infection 05/05/2010  . HEADACHE 01/28/2010  . Hearing loss 12/10/2009  . HYPERKALEMIA 10/22/2009  . ABSCESS, AXILLA 10/22/2009  . Actinic keratosis 10/22/2009  . Disorder resulting from impaired renal function 08/14/2009  . ABNORMAL CV (STRESS) TEST 03/12/2009  . DYSPNEA ON EXERTION 02/20/2009  . FATIGUE 01/09/2008  . Hypothyroidism 03/03/2007  . Memory loss 03/03/2007  . HYPERLIPIDEMIA 10/13/2006  . ANEMIA-NOS 10/13/2006  . Essential hypertension 10/13/2006  . TIA (transient ischemic attack) 10/13/2006  . PANCREATITIS, HX OF 10/13/2006  . Diverticulitis of colon 10/13/2006    Past Surgical History:  Procedure Laterality Date  . CHOLECYSTECTOMY    . COLONOSCOPY    . STAPEDES SURGERY     LEFT EAR        Home Medications    Prior to Admission medications   Medication Sig Start Date End Date Taking? Authorizing  Provider  alfuzosin (UROXATRAL) 10 MG 24 hr tablet TAKE 1 TABLET (10 MG TOTAL) BY MOUTH DAILY WITH BREAKFAST. 06/06/17   Plotnikov, Evie Lacks, MD  amLODipine (NORVASC) 2.5 MG tablet Take 1 tablet (2.5 mg total) by mouth daily. 12/02/16   Plotnikov, Evie Lacks, MD  aspirin 81 MG tablet Take 162 mg by mouth daily.    [provider]  cephALEXin (KEFLEX) 500 MG capsule Take 1 capsule (500 mg total) by mouth 2 (two) times daily for 3 days. 06/07/17 06/10/17  Sherwood Gambler, MD  cholecalciferol (VITAMIN D) 1000 UNITS tablet Take 2,000 Units by mouth daily.      [provider]  clotrimazole-betamethasone (LOTRISONE) cream Apply 1 application topically 2 (two) times daily. 04/01/17   Plotnikov, Evie Lacks, MD  colchicine 0.6 MG tablet Take 1 tablet (0.6 mg total) by mouth 3 (three) times daily as needed. For gout 07/21/16   Plotnikov, Evie Lacks, MD  Cyanocobalamin (VITAMIN B 12 PO) Take 1 tablet by mouth daily.      [provider]  labetalol (NORMODYNE) 200 MG tablet TAKE 2 TABLETS BY MOUTH 2 TIMES DAILY. 01/31/17   Plotnikov, Evie Lacks, MD  levothyroxine (SYNTHROID, LEVOTHROID) 88 MCG tablet TAKE 1 TABLET (88 MCG TOTAL) BY MOUTH DAILY. 05/06/17   Plotnikov, Evie Lacks, MD  loperamide (IMODIUM A-D) 2 MG tablet Take 1 tablet (2 mg total) by mouth 4 (four) times daily as needed for diarrhea or loose stools. 04/04/15   Plotnikov, Evie Lacks, MD  Pitavastatin Calcium (LIVALO) 1 MG TABS Take 1 tablet (1 mg total) by mouth daily. 04/01/17   Plotnikov, Evie Lacks, MD    Family History Family History  Problem Relation Age of Onset  . Heart disease Sister   . Dementia Mother   . Hypertension Other   . Colon cancer Neg Hx   . Stomach cancer Neg Hx   . Esophageal cancer Neg Hx   . Rectal cancer Neg Hx     Social History Social History   Tobacco Use  . Smoking status: Never Smoker  . Smokeless tobacco: Never Used  Substance Use Topics  . Alcohol use: No  . Drug use: No     Allergies   Lipitor [atorvastatin]; Spironolactone; Coreg [carvedilol]; Donepezil hydrochloride; Hydrochlorothiazide; and Razadyne [galantamine hydrobromide]   Review of Systems Review of Systems  Constitutional: Negative for fever.  Gastrointestinal: Negative for abdominal pain and vomiting.  Genitourinary: Positive for dysuria, hematuria and penile pain.  Musculoskeletal: Negative for back pain.  All other systems reviewed and are negative.    Physical Exam Updated Vital Signs BP (!) 164/78 (BP Location: Right Arm)   Pulse 77   Temp 98.1 F (36.7 C) (Oral)   Resp 18   Ht 6'  2" (1.88 m)   Wt 87.9 kg (193 lb 11.2 oz)   SpO2 97%   BMI 24.87 kg/m   Physical Exam  Constitutional: He is oriented to person, place, and time. He appears well-developed and well-nourished.  HENT:  Head: Normocephalic and atraumatic.  Right Ear: External ear normal.  Left Ear: External ear normal.  Nose: Nose normal.  Eyes: Right eye exhibits no discharge. Left eye exhibits no discharge.  Neck: Neck supple.  Cardiovascular: Normal rate, regular rhythm and normal heart sounds.  Pulmonary/Chest: Effort normal and breath sounds normal.  Abdominal: Soft. There is tenderness in the suprapubic area.  Genitourinary: Penile tenderness present. No discharge found.  Genitourinary Comments: Mild blood at meatus  Musculoskeletal: He  exhibits no edema.  Neurological: He is alert and oriented to person, place, and time.  Skin: Skin is warm and dry. He is not diaphoretic.  Nursing note and vitals reviewed.    ED Treatments / Results  Labs (all labs ordered are listed, but only abnormal results are displayed) Labs Reviewed  URINALYSIS, ROUTINE W REFLEX MICROSCOPIC - Abnormal; Notable for the following components:      Result Value   Color, Urine RED (*)    APPearance TURBID (*)    Glucose, UA   (*)    Value: TEST NOT REPORTED DUE TO COLOR INTERFERENCE OF URINE PIGMENT   Hgb urine dipstick   (*)    Value: TEST NOT REPORTED DUE TO COLOR INTERFERENCE OF URINE PIGMENT   Bilirubin Urine   (*)    Value: TEST NOT REPORTED DUE TO COLOR INTERFERENCE OF URINE PIGMENT   Ketones, ur   (*)    Value: TEST NOT REPORTED DUE TO COLOR INTERFERENCE OF URINE PIGMENT   Protein, ur   (*)    Value: TEST NOT REPORTED DUE TO COLOR INTERFERENCE OF URINE PIGMENT   Nitrite   (*)    Value: TEST NOT REPORTED DUE TO COLOR INTERFERENCE OF URINE PIGMENT   Leukocytes, UA   (*)    Value: TEST NOT REPORTED DUE TO COLOR INTERFERENCE OF URINE PIGMENT   All other components within normal limits  URINE CULTURE     EKG None  Radiology No results found.  Procedures Procedures (including critical care time)  Medications Ordered in ED Medications  acetaminophen (TYLENOL) tablet 650 mg (has no administration in time range)  HYDROcodone-acetaminophen (NORCO/VICODIN) 5-325 MG per tablet 1 tablet (has no administration in time range)  cephALEXin (KEFLEX) capsule 500 mg (has no administration in time range)  lidocaine (XYLOCAINE) 2 % jelly 1 application (has no administration in time range)  lidocaine (XYLOCAINE) 2 % jelly 1 application (1 application Urethral Given 06/07/17 2207)     Initial Impression / Assessment and Plan / ED Course  I have reviewed the triage vital signs and the nursing notes.  Pertinent labs & imaging results that were available during my care of the patient were reviewed by me and considered in my medical decision making (see chart for details).     Patient is an acute urinary retention.  He is unable to void hardly at all.  Foley catheter placed with moderate return of blood.  He is still quite uncomfortable, mostly in his penis.  I discussed with Dr. Gloriann Loan of urology.  He states the only other adjustments would be to increase the size of the Foley catheter to a 20 or 22 and flush as needed.  As long as he is draining there is no further treatment warranted.  Urinalysis unable to be interpreted due to the amount of blood but he recommends covering with Keflex for possible post procedural infection.  At this point the patient is still uncomfortable with his current Foley catheter and thus it will be upgraded to a higher size.  If this flushes well and he is draining that he can be discharged home tonight to follow-up with urology tomorrow as instructed by Dr. Gloriann Loan. Care transferred to Dr. Florina Ou. If bigger foley drains well, d/c home. If not, will need to call urology for consultation.  Final Clinical Impressions(s) / ED Diagnoses   Final diagnoses:  Acute urinary retention   Gross hematuria    ED Discharge Orders  Ordered    cephALEXin (KEFLEX) 500 MG capsule  2 times daily     06/07/17 2310       Sherwood Gambler, MD 06/08/17 [phone]

## 2017-06-08 DIAGNOSIS — R310 Gross hematuria: Secondary | ICD-10-CM | POA: Diagnosis not present

## 2017-06-08 DIAGNOSIS — R3914 Feeling of incomplete bladder emptying: Secondary | ICD-10-CM | POA: Diagnosis not present

## 2017-06-08 DIAGNOSIS — R3982 Chronic bladder pain: Secondary | ICD-10-CM | POA: Diagnosis not present

## 2017-06-08 MED ORDER — LIDOCAINE HCL 2 % EX GEL
1.0000 "application " | Freq: Once | CUTANEOUS | Status: AC
  Administered 2017-06-08: 1 via URETHRAL
  Filled 2017-06-08: qty 5

## 2017-06-08 MED ORDER — BELLADONNA ALKALOIDS-OPIUM 16.2-60 MG RE SUPP: 1.0000 | Freq: Once | RECTAL | Status: DC

## 2017-06-08 MED ORDER — BELLADONNA ALKALOIDS-OPIUM 16.2-60 MG RE SUPP
0.5000 | Freq: Once | RECTAL | Status: AC
  Administered 2017-06-08: 0.5 via RECTAL
  Filled 2017-06-08: qty 1

## 2017-06-08 MED ORDER — BELLADONNA-OPIUM 16.2-30 MG RE SUPP: 15.0000 mg | Freq: Four times a day (QID) | RECTAL | 0 refills | Status: DC | PRN

## 2017-06-08 NOTE — ED Provider Notes (Signed)
2:22 AM Patient continues to complain of bladder spasms.  We will try BNO suppositories, one half every 6 hours as needed for spasms.  His daughter will contact his urologist later today for follow-up.     Venida Tsukamoto, Jenny Reichmann, MD 06/08/17 Rogene [place]

## 2017-06-10 LAB — URINE CULTURE: Culture: >=100000 — AB

## 2017-06-12 ENCOUNTER — Telehealth: Payer: Self-pay

## 2017-06-12 NOTE — Progress Notes (Signed)
ED Antimicrobial Stewardship Positive Culture Follow Up   Kenneth Williams is an 82 y.o. male who presented to [facility] on 06/07/2017 with a chief complaint of  Chief Complaint  Patient presents with  . Hematuria  . Urinary Frequency    Recent Results (from the past 720 hour(s))  Urine culture     Status: Abnormal   Collection Time: 06/07/17  9:35 PM  Result Value Ref Range Status   Specimen Description   Final    URINE, CATHETERIZED Performed at [facility] [address] [place], [place] [address]    Special Requests   Final    NONE Performed at [facility], [place] [address], [place],  [address]    Culture >=100,000 COLONIES/mL PSEUDOMONAS AERUGINOSA (A)  Final   Report Status 06/10/2017 FINAL  Final   Organism ID, Bacteria PSEUDOMONAS AERUGINOSA (A)  Final      Susceptibility   Pseudomonas aeruginosa - MIC*    CEFTAZIDIME 4 SENSITIVE Sensitive     CIPROFLOXACIN >=4 RESISTANT Resistant     GENTAMICIN >=16 RESISTANT Resistant     IMIPENEM >=16 RESISTANT Resistant     PIP/TAZO 64 SENSITIVE Sensitive     CEFEPIME 8 SENSITIVE Sensitive     * >=100,000 COLONIES/mL PSEUDOMONAS AERUGINOSA    [x]  Treated with Keflex, organism resistant to prescribed antimicrobial []  Patient discharged originally without antimicrobial agent and treatment is now indicated  New antibiotic prescription: Follow-up for symptom resolution, if no better the patient will need to be treated with IV antibiotics.  ED Provider: Arlean Hopping PA-C   [place] 06/12/2017, 1:02 PM

## 2017-06-12 NOTE — Telephone Encounter (Signed)
Post ED Visit - Positive Culture Follow-up: Unsuccessful Patient Follow-up  Culture assessed and recommendations reviewed by:  []  Elenor Quinones, Pharm.D. []  Heide Guile, Pharm.D., BCPS AQ-ID []  Parks Neptune, Pharm.D., BCPS []  Alycia Rossetti, Pharm.D., BCPS []  [place], Pharm.D., BCPS, AAHIVP []  Legrand Como, Pharm.D., BCPS, AAHIVP []  Salome Arnt, PharmD, BCPS []  Dimitri Ped, PharmD, BCPS []  Vincenza Hews, PharmD, BCPS Georga Bora Pharm D Positive urine culture Symptom check may need IV therapy []  Patient discharged without antimicrobial prescription and treatment is now indicated []  Organism is resistant to prescribed ED discharge antimicrobial []  Patient with positive blood cultures   Unable to contact patient after 3 attempts, letter will be sent to address on file  Genia Del 06/12/2017, 2:18 PM

## 2017-06-13 DIAGNOSIS — R310 Gross hematuria: Secondary | ICD-10-CM | POA: Diagnosis not present

## 2017-06-13 DIAGNOSIS — N401 Enlarged prostate with lower urinary tract symptoms: Secondary | ICD-10-CM | POA: Diagnosis not present

## 2017-06-13 DIAGNOSIS — R3982 Chronic bladder pain: Secondary | ICD-10-CM | POA: Diagnosis not present

## 2017-06-13 DIAGNOSIS — R3914 Feeling of incomplete bladder emptying: Secondary | ICD-10-CM | POA: Diagnosis not present

## 2017-06-16 DIAGNOSIS — N401 Enlarged prostate with lower urinary tract symptoms: Secondary | ICD-10-CM | POA: Diagnosis not present

## 2017-06-16 DIAGNOSIS — R3914 Feeling of incomplete bladder emptying: Secondary | ICD-10-CM | POA: Diagnosis not present

## 2017-06-17 DIAGNOSIS — R3982 Chronic bladder pain: Secondary | ICD-10-CM | POA: Diagnosis not present

## 2017-06-17 DIAGNOSIS — N401 Enlarged prostate with lower urinary tract symptoms: Secondary | ICD-10-CM | POA: Diagnosis not present

## 2017-06-17 DIAGNOSIS — R3914 Feeling of incomplete bladder emptying: Secondary | ICD-10-CM | POA: Diagnosis not present

## 2017-06-17 DIAGNOSIS — N3000 Acute cystitis without hematuria: Secondary | ICD-10-CM | POA: Diagnosis not present

## 2017-06-20 ENCOUNTER — Telehealth: Payer: Self-pay | Admitting: Emergency Medicine

## 2017-06-23 DIAGNOSIS — R3982 Chronic bladder pain: Secondary | ICD-10-CM | POA: Diagnosis not present

## 2017-06-23 DIAGNOSIS — N3000 Acute cystitis without hematuria: Secondary | ICD-10-CM | POA: Diagnosis not present

## 2017-06-23 DIAGNOSIS — R3914 Feeling of incomplete bladder emptying: Secondary | ICD-10-CM | POA: Diagnosis not present

## 2017-06-24 DIAGNOSIS — N401 Enlarged prostate with lower urinary tract symptoms: Secondary | ICD-10-CM | POA: Diagnosis not present

## 2017-06-24 DIAGNOSIS — N3000 Acute cystitis without hematuria: Secondary | ICD-10-CM | POA: Diagnosis not present

## 2017-06-24 DIAGNOSIS — R3914 Feeling of incomplete bladder emptying: Secondary | ICD-10-CM | POA: Diagnosis not present

## 2017-06-29 ENCOUNTER — Encounter: Payer: Self-pay | Admitting: Internal Medicine

## 2017-06-29 ENCOUNTER — Ambulatory Visit (INDEPENDENT_AMBULATORY_CARE_PROVIDER_SITE_OTHER): Payer: PPO | Admitting: Internal Medicine

## 2017-06-29 DIAGNOSIS — R350 Frequency of micturition: Secondary | ICD-10-CM | POA: Diagnosis not present

## 2017-06-29 DIAGNOSIS — M1120 Other chondrocalcinosis, unspecified site: Secondary | ICD-10-CM | POA: Diagnosis not present

## 2017-06-29 DIAGNOSIS — N390 Urinary tract infection, site not specified: Secondary | ICD-10-CM | POA: Diagnosis not present

## 2017-06-29 DIAGNOSIS — N401 Enlarged prostate with lower urinary tract symptoms: Secondary | ICD-10-CM

## 2017-06-29 DIAGNOSIS — E034 Atrophy of thyroid (acquired): Secondary | ICD-10-CM

## 2017-06-29 DIAGNOSIS — R7989 Other specified abnormal findings of blood chemistry: Secondary | ICD-10-CM

## 2017-06-29 DIAGNOSIS — T83511D Infection and inflammatory reaction due to indwelling urethral catheter, subsequent encounter: Secondary | ICD-10-CM

## 2017-06-29 NOTE — Progress Notes (Signed)
Subjective:  Patient ID: Kenneth Williams, male    DOB: 04/23/1935  Age: 82 y.o. MRN: [id number]  CC: No chief complaint on file.   HPI Kenneth Williams presents for UTI and urinary retention. He failed oral abx. He is due IV abx at home. He has a catheter in.  C/o fatigue, weakness F/u gout  Outpatient Medications Prior to Visit  Medication Sig Dispense Refill  . alfuzosin (UROXATRAL) 10 MG 24 hr tablet TAKE 1 TABLET (10 MG TOTAL) BY MOUTH DAILY WITH BREAKFAST. 30 tablet 5  . amLODipine (NORVASC) 2.5 MG tablet Take 1 tablet (2.5 mg total) by mouth daily. 30 tablet 11  . aspirin 81 MG tablet Take 162 mg by mouth daily.    . belladonna-opium (B&O SUPPRETTES) 16.2-30 MG suppository Place 0.5 suppositories rectally every 6 (six) hours as needed (for bladder spasms). 6 suppository 0  . cholecalciferol (VITAMIN D) 1000 UNITS tablet Take 2,000 Units by mouth daily.      . clotrimazole-betamethasone (LOTRISONE) cream Apply 1 application topically 2 (two) times daily. 45 g 1  . colchicine 0.6 MG tablet Take 1 tablet (0.6 mg total) by mouth 3 (three) times daily as needed. For gout 60 tablet 2  . Cyanocobalamin (VITAMIN B 12 PO) Take 1 tablet by mouth daily.      Marland Kitchen labetalol (NORMODYNE) 200 MG tablet TAKE 2 TABLETS BY MOUTH 2 TIMES DAILY. 360 tablet 3  . levothyroxine (SYNTHROID, LEVOTHROID) 88 MCG tablet TAKE 1 TABLET (88 MCG TOTAL) BY MOUTH DAILY. 30 tablet 11  . loperamide (IMODIUM A-D) 2 MG tablet Take 1 tablet (2 mg total) by mouth 4 (four) times daily as needed for diarrhea or loose stools. 30 tablet 0  . Pitavastatin Calcium (LIVALO) 1 MG TABS Take 1 tablet (1 mg total) by mouth daily. 30 tablet 11   No facility-administered medications prior to visit.     ROS Review of Systems  Constitutional: Positive for fatigue. Negative for appetite change and unexpected weight change.  HENT: Negative for congestion, nosebleeds, sneezing, sore throat and trouble swallowing.   Eyes: Negative for  itching and visual disturbance.  Respiratory: Negative for cough.   Cardiovascular: Negative for chest pain, palpitations and leg swelling.  Gastrointestinal: Negative for abdominal distention, blood in stool, diarrhea and nausea.  Genitourinary: Positive for difficulty urinating, frequency and urgency. Negative for hematuria.  Musculoskeletal: Negative for back pain, gait problem, joint swelling and neck pain.  Skin: Negative for rash.  Neurological: Positive for weakness. Negative for dizziness, tremors and speech difficulty.  Psychiatric/Behavioral: Positive for decreased concentration. Negative for agitation, dysphoric mood and sleep disturbance. The patient is not nervous/anxious.     Objective:  BP 132/74 (BP Location: Left Arm, Patient Position: Sitting, Cuff Size: Large)   Pulse 73   Temp 98.4 F (36.9 C) (Oral)   Ht 6\' 2"  (1.88 m)   Wt 187 lb (84.8 kg)   SpO2 99%   BMI 24.01 kg/m   BP Readings from Last 3 Encounters:  06/29/17 132/74  06/08/17 (!) 166/72  04/01/17 140/80    Wt Readings from Last 3 Encounters:  06/29/17 187 lb (84.8 kg)  06/07/17 193 lb 11.2 oz (87.9 kg)  04/01/17 192 lb 8 oz (87.3 kg)    Physical Exam  Constitutional: He is oriented to person, place, and time. He appears well-developed. No distress.  NAD  HENT:  Mouth/Throat: Oropharynx is clear and moist.  Eyes: Pupils are equal, round, and reactive to light. Conjunctivae  are normal.  Neck: Normal range of motion. No JVD present. No thyromegaly present.  Cardiovascular: Normal rate, regular rhythm, normal heart sounds and intact distal pulses. Exam reveals no gallop and no friction rub.  No murmur heard. Pulmonary/Chest: Effort normal and breath sounds normal. No respiratory distress. He has no wheezes. He has no rales. He exhibits no tenderness.  Abdominal: Soft. Bowel sounds are normal. He exhibits no distension and no mass. There is no tenderness. There is no rebound and no guarding.    Musculoskeletal: Normal range of motion. He exhibits no edema or tenderness.  Lymphadenopathy:    He has no cervical adenopathy.  Neurological: He is alert and oriented to person, place, and time. He has normal reflexes. No cranial nerve deficit. He exhibits normal muscle tone. He displays a negative Romberg sign. Coordination and gait normal.  Skin: Skin is warm and dry. No rash noted.  Psychiatric: He has a normal mood and affect. His behavior is normal. Judgment and thought content normal.   Foley cath  Lab Results  Component Value Date   WBC 9.4 03/03/2017   HGB 14.1 03/03/2017   HCT 43.5 03/03/2017   PLT 253.0 03/03/2017   GLUCOSE 102 (H) 03/03/2017   CHOL 193 03/25/2014   TRIG 314.0 (H) 03/25/2014   HDL 32.80 (L) 03/25/2014   LDLDIRECT 123.0 03/25/2014   LDLCALC 144 (H) 09/12/2013   ALT 16 03/03/2017   AST 12 03/03/2017   NA 141 03/03/2017   K 4.8 03/03/2017   CL 108 03/03/2017   CREATININE 2.26 (H) 03/03/2017   BUN 27 (H) 03/03/2017   CO2 25 03/03/2017   TSH 2.53 05/18/2016   PSA 1.73 05/18/2016   INR 1.1 (H) 09/15/2015    No results found.  Assessment & Plan:   There are no diagnoses linked to this encounter. I am having Kenneth Williams maintain his Cyanocobalamin (VITAMIN B 12 PO), cholecalciferol, aspirin, loperamide, colchicine, amLODipine, labetalol, Pitavastatin Calcium, clotrimazole-betamethasone, levothyroxine, alfuzosin, and belladonna-opium.  No orders of the defined types were placed in this encounter.    Follow-up: No follow-ups on file.  Walker Kehr, MD

## 2017-06-29 NOTE — Assessment & Plan Note (Signed)
S/p cystoscopy. Current UTI and urinary retention. He failed oral abx. He is due IV abx at home. He has a catheter in.  F/u w/Dr Diona Fanti

## 2017-06-29 NOTE — Assessment & Plan Note (Signed)
Colchicine prn 

## 2017-06-29 NOTE — Assessment & Plan Note (Signed)
UTI and urinary retention. He failed oral abx. He is due IV abx at home. Home Care. He has a catheter in.  F/u w/Dr Diona Fanti

## 2017-06-29 NOTE — Assessment & Plan Note (Signed)
Labs

## 2017-06-29 NOTE — Assessment & Plan Note (Signed)
Levothroid 

## 2017-06-30 ENCOUNTER — Other Ambulatory Visit: Payer: Self-pay | Admitting: Internal Medicine

## 2017-06-30 DIAGNOSIS — R3915 Urgency of urination: Secondary | ICD-10-CM | POA: Diagnosis not present

## 2017-06-30 DIAGNOSIS — Z466 Encounter for fitting and adjustment of urinary device: Secondary | ICD-10-CM | POA: Diagnosis not present

## 2017-06-30 DIAGNOSIS — I998 Other disorder of circulatory system: Secondary | ICD-10-CM | POA: Diagnosis not present

## 2017-06-30 DIAGNOSIS — R3912 Poor urinary stream: Secondary | ICD-10-CM | POA: Diagnosis not present

## 2017-06-30 DIAGNOSIS — N323 Diverticulum of bladder: Secondary | ICD-10-CM | POA: Diagnosis not present

## 2017-06-30 DIAGNOSIS — I10 Essential (primary) hypertension: Secondary | ICD-10-CM | POA: Diagnosis not present

## 2017-06-30 DIAGNOSIS — N401 Enlarged prostate with lower urinary tract symptoms: Secondary | ICD-10-CM | POA: Diagnosis not present

## 2017-06-30 DIAGNOSIS — Z5181 Encounter for therapeutic drug level monitoring: Secondary | ICD-10-CM | POA: Diagnosis not present

## 2017-06-30 DIAGNOSIS — N3289 Other specified disorders of bladder: Secondary | ICD-10-CM | POA: Diagnosis not present

## 2017-06-30 DIAGNOSIS — R3916 Straining to void: Secondary | ICD-10-CM | POA: Diagnosis not present

## 2017-06-30 DIAGNOSIS — R3914 Feeling of incomplete bladder emptying: Secondary | ICD-10-CM | POA: Diagnosis not present

## 2017-06-30 DIAGNOSIS — Z452 Encounter for adjustment and management of vascular access device: Secondary | ICD-10-CM | POA: Diagnosis not present

## 2017-06-30 DIAGNOSIS — N3000 Acute cystitis without hematuria: Secondary | ICD-10-CM | POA: Diagnosis not present

## 2017-06-30 DIAGNOSIS — R350 Frequency of micturition: Secondary | ICD-10-CM | POA: Diagnosis not present

## 2017-07-01 DIAGNOSIS — N390 Urinary tract infection, site not specified: Secondary | ICD-10-CM | POA: Diagnosis not present

## 2017-07-01 DIAGNOSIS — Z466 Encounter for fitting and adjustment of urinary device: Secondary | ICD-10-CM | POA: Diagnosis not present

## 2017-07-05 DIAGNOSIS — H8081 Other otosclerosis, right ear: Secondary | ICD-10-CM | POA: Diagnosis not present

## 2017-07-05 DIAGNOSIS — H906 Mixed conductive and sensorineural hearing loss, bilateral: Secondary | ICD-10-CM | POA: Diagnosis not present

## 2017-07-10 DIAGNOSIS — I10 Essential (primary) hypertension: Secondary | ICD-10-CM | POA: Diagnosis not present

## 2017-07-10 DIAGNOSIS — B965 Pseudomonas (aeruginosa) (mallei) (pseudomallei) as the cause of diseases classified elsewhere: Secondary | ICD-10-CM | POA: Diagnosis not present

## 2017-07-12 DIAGNOSIS — R3982 Chronic bladder pain: Secondary | ICD-10-CM | POA: Diagnosis not present

## 2017-07-12 DIAGNOSIS — R3914 Feeling of incomplete bladder emptying: Secondary | ICD-10-CM | POA: Diagnosis not present

## 2017-07-12 DIAGNOSIS — N3000 Acute cystitis without hematuria: Secondary | ICD-10-CM | POA: Diagnosis not present

## 2017-07-14 DIAGNOSIS — R3982 Chronic bladder pain: Secondary | ICD-10-CM | POA: Diagnosis not present

## 2017-07-14 DIAGNOSIS — N3000 Acute cystitis without hematuria: Secondary | ICD-10-CM | POA: Diagnosis not present

## 2017-07-14 DIAGNOSIS — R3914 Feeling of incomplete bladder emptying: Secondary | ICD-10-CM | POA: Diagnosis not present

## 2017-07-19 DIAGNOSIS — N3000 Acute cystitis without hematuria: Secondary | ICD-10-CM | POA: Diagnosis not present

## 2017-07-19 DIAGNOSIS — R3914 Feeling of incomplete bladder emptying: Secondary | ICD-10-CM | POA: Diagnosis not present

## 2017-07-19 DIAGNOSIS — N401 Enlarged prostate with lower urinary tract symptoms: Secondary | ICD-10-CM | POA: Diagnosis not present

## 2017-07-27 DIAGNOSIS — R3914 Feeling of incomplete bladder emptying: Secondary | ICD-10-CM | POA: Diagnosis not present

## 2017-07-27 DIAGNOSIS — N401 Enlarged prostate with lower urinary tract symptoms: Secondary | ICD-10-CM | POA: Diagnosis not present

## 2017-08-08 DIAGNOSIS — N3000 Acute cystitis without hematuria: Secondary | ICD-10-CM | POA: Diagnosis not present

## 2017-08-08 DIAGNOSIS — Z452 Encounter for adjustment and management of vascular access device: Secondary | ICD-10-CM | POA: Diagnosis not present

## 2017-08-08 DIAGNOSIS — I10 Essential (primary) hypertension: Secondary | ICD-10-CM | POA: Diagnosis not present

## 2017-08-08 DIAGNOSIS — R3915 Urgency of urination: Secondary | ICD-10-CM | POA: Diagnosis not present

## 2017-08-08 DIAGNOSIS — N323 Diverticulum of bladder: Secondary | ICD-10-CM | POA: Diagnosis not present

## 2017-08-08 DIAGNOSIS — Z466 Encounter for fitting and adjustment of urinary device: Secondary | ICD-10-CM | POA: Diagnosis not present

## 2017-08-08 DIAGNOSIS — I998 Other disorder of circulatory system: Secondary | ICD-10-CM | POA: Diagnosis not present

## 2017-08-08 DIAGNOSIS — R350 Frequency of micturition: Secondary | ICD-10-CM | POA: Diagnosis not present

## 2017-08-08 DIAGNOSIS — N3289 Other specified disorders of bladder: Secondary | ICD-10-CM | POA: Diagnosis not present

## 2017-08-08 DIAGNOSIS — R3916 Straining to void: Secondary | ICD-10-CM | POA: Diagnosis not present

## 2017-08-08 DIAGNOSIS — R3914 Feeling of incomplete bladder emptying: Secondary | ICD-10-CM | POA: Diagnosis not present

## 2017-08-08 DIAGNOSIS — N401 Enlarged prostate with lower urinary tract symptoms: Secondary | ICD-10-CM | POA: Diagnosis not present

## 2017-08-08 DIAGNOSIS — R3912 Poor urinary stream: Secondary | ICD-10-CM | POA: Diagnosis not present

## 2017-08-08 DIAGNOSIS — Z5181 Encounter for therapeutic drug level monitoring: Secondary | ICD-10-CM | POA: Diagnosis not present

## 2017-08-09 DIAGNOSIS — R300 Dysuria: Secondary | ICD-10-CM | POA: Diagnosis not present

## 2017-08-09 DIAGNOSIS — R3914 Feeling of incomplete bladder emptying: Secondary | ICD-10-CM | POA: Diagnosis not present

## 2017-08-09 DIAGNOSIS — N401 Enlarged prostate with lower urinary tract symptoms: Secondary | ICD-10-CM | POA: Diagnosis not present

## 2017-08-10 DIAGNOSIS — R3914 Feeling of incomplete bladder emptying: Secondary | ICD-10-CM | POA: Diagnosis not present

## 2017-08-17 ENCOUNTER — Encounter: Payer: Self-pay | Admitting: Internal Medicine

## 2017-08-17 ENCOUNTER — Ambulatory Visit (INDEPENDENT_AMBULATORY_CARE_PROVIDER_SITE_OTHER): Payer: PPO | Admitting: Internal Medicine

## 2017-08-17 DIAGNOSIS — I10 Essential (primary) hypertension: Secondary | ICD-10-CM | POA: Diagnosis not present

## 2017-08-17 DIAGNOSIS — R350 Frequency of micturition: Secondary | ICD-10-CM

## 2017-08-17 DIAGNOSIS — E785 Hyperlipidemia, unspecified: Secondary | ICD-10-CM

## 2017-08-17 DIAGNOSIS — R7989 Other specified abnormal findings of blood chemistry: Secondary | ICD-10-CM

## 2017-08-17 DIAGNOSIS — F332 Major depressive disorder, recurrent severe without psychotic features: Secondary | ICD-10-CM | POA: Diagnosis not present

## 2017-08-17 DIAGNOSIS — N401 Enlarged prostate with lower urinary tract symptoms: Secondary | ICD-10-CM | POA: Diagnosis not present

## 2017-08-17 MED ORDER — LABETALOL HCL 300 MG PO TABS: 300.0000 mg | ORAL_TABLET | Freq: Two times a day (BID) | ORAL | 11 refills | Status: DC

## 2017-08-17 MED ORDER — PRAVASTATIN SODIUM 20 MG PO TABS: 20.0000 mg | ORAL_TABLET | Freq: Every day | ORAL | 3 refills | Status: DC

## 2017-08-17 NOTE — Assessment & Plan Note (Addendum)
F/u Dr Diona Fanti Uroxatral  Self-catheterization

## 2017-08-17 NOTE — Assessment & Plan Note (Signed)
CRF due to HTN BMET

## 2017-08-17 NOTE — Assessment & Plan Note (Addendum)
Intolerant of Statins Try Pravachol

## 2017-08-17 NOTE — Assessment & Plan Note (Signed)
Doing fair 

## 2017-08-17 NOTE — Progress Notes (Signed)
Subjective:  Patient ID: Kenneth Williams, male    DOB: 05/18/1935  Age: 82 y.o. MRN: [id number]  CC: No chief complaint on file.   HPI Kenneth Williams presents for HTN, BPH, hypothyroidism  Outpatient Medications Prior to Visit  Medication Sig Dispense Refill  . alfuzosin (UROXATRAL) 10 MG 24 hr tablet TAKE 1 TABLET (10 MG TOTAL) BY MOUTH DAILY WITH BREAKFAST. 30 tablet 5  . amLODipine (NORVASC) 2.5 MG tablet Take 1 tablet (2.5 mg total) by mouth daily. 30 tablet 11  . aspirin 81 MG tablet Take 162 mg by mouth daily.    . cholecalciferol (VITAMIN D) 1000 UNITS tablet Take 2,000 Units by mouth daily.      . clotrimazole-betamethasone (LOTRISONE) cream Apply 1 application topically 2 (two) times daily. 45 g 1  . colchicine 0.6 MG tablet Take 1 tablet (0.6 mg total) by mouth 3 (three) times daily as needed. For gout 60 tablet 2  . Cyanocobalamin (VITAMIN B 12 PO) Take 1 tablet by mouth daily.      . finasteride (PROSCAR) 5 MG tablet TAKE 1 TABLET (5 MG TOTAL) BY MOUTH DAILY. 90 tablet 3  . labetalol (NORMODYNE) 200 MG tablet TAKE 2 TABLETS BY MOUTH 2 TIMES DAILY. 360 tablet 3  . levothyroxine (SYNTHROID, LEVOTHROID) 88 MCG tablet TAKE 1 TABLET (88 MCG TOTAL) BY MOUTH DAILY. 30 tablet 11  . loperamide (IMODIUM A-D) 2 MG tablet Take 1 tablet (2 mg total) by mouth 4 (four) times daily as needed for diarrhea or loose stools. 30 tablet 0  . belladonna-opium (B&O SUPPRETTES) 16.2-30 MG suppository Place 0.5 suppositories rectally every 6 (six) hours as needed (for bladder spasms). (Patient not taking: Reported on 08/17/2017) 6 suppository 0  . Pitavastatin Calcium (LIVALO) 1 MG TABS Take 1 tablet (1 mg total) by mouth daily. (Patient not taking: Reported on 08/17/2017) 30 tablet 11   No facility-administered medications prior to visit.     ROS: Review of Systems  Constitutional: Positive for fatigue. Negative for appetite change and unexpected weight change.  HENT: Negative for congestion,  nosebleeds, sneezing, sore throat and trouble swallowing.   Eyes: Negative for itching and visual disturbance.  Respiratory: Negative for cough.   Cardiovascular: Positive for leg swelling. Negative for chest pain and palpitations.  Gastrointestinal: Negative for abdominal distention, blood in stool, diarrhea and nausea.  Genitourinary: Negative for frequency and hematuria.  Musculoskeletal: Positive for arthralgias. Negative for back pain, gait problem, joint swelling and neck pain.  Skin: Negative for rash.  Neurological: Negative for dizziness, tremors, speech difficulty and weakness.  Psychiatric/Behavioral: Positive for dysphoric mood. Negative for agitation, sleep disturbance and suicidal ideas. The patient is not nervous/anxious.     Objective:  BP (!) 142/86 (BP Location: Left Arm, Patient Position: Sitting, Cuff Size: Normal)   Pulse 64   Temp 98.3 F (36.8 C) (Oral)   Ht 6\' 2"  (1.88 m)   Wt 190 lb (86.2 kg)   SpO2 98%   BMI 24.39 kg/m   BP Readings from Last 3 Encounters:  08/17/17 (!) 142/86  06/29/17 132/74  06/08/17 (!) 166/72    Wt Readings from Last 3 Encounters:  08/17/17 190 lb (86.2 kg)  06/29/17 187 lb (84.8 kg)  06/07/17 193 lb 11.2 oz (87.9 kg)    Physical Exam  Constitutional: He is oriented to person, place, and time. He appears well-developed. No distress.  NAD  HENT:  Mouth/Throat: Oropharynx is clear and moist.  Eyes: Pupils are equal,  round, and reactive to light. Conjunctivae are normal.  Neck: Normal range of motion. No JVD present. No thyromegaly present.  Cardiovascular: Normal rate, regular rhythm, normal heart sounds and intact distal pulses. Exam reveals no gallop and no friction rub.  No murmur heard. Pulmonary/Chest: Effort normal and breath sounds normal. No respiratory distress. He has no wheezes. He has no rales. He exhibits no tenderness.  Abdominal: Soft. Bowel sounds are normal. He exhibits no distension and no mass. There is no  tenderness. There is no rebound and no guarding.  Musculoskeletal: Normal range of motion. He exhibits no edema or tenderness.  Lymphadenopathy:    He has no cervical adenopathy.  Neurological: He is alert and oriented to person, place, and time. He has normal reflexes. No cranial nerve deficit. He exhibits normal muscle tone. He displays a negative Romberg sign. Coordination and gait normal.  Skin: Skin is warm and dry. No rash noted.  Psychiatric: He has a normal mood and affect. His behavior is normal. Judgment and thought content normal.   Trace edema R ankle Lab Results  Component Value Date   WBC 9.4 03/03/2017   HGB 14.1 03/03/2017   HCT 43.5 03/03/2017   PLT 253.0 03/03/2017   GLUCOSE 102 (H) 03/03/2017   CHOL 193 03/25/2014   TRIG 314.0 (H) 03/25/2014   HDL 32.80 (L) 03/25/2014   LDLDIRECT 123.0 03/25/2014   LDLCALC 144 (H) 09/12/2013   ALT 16 03/03/2017   AST 12 03/03/2017   NA 141 03/03/2017   K 4.8 03/03/2017   CL 108 03/03/2017   CREATININE 2.26 (H) 03/03/2017   BUN 27 (H) 03/03/2017   CO2 25 03/03/2017   TSH 2.53 05/18/2016   PSA 1.73 05/18/2016   INR 1.1 (H) 09/15/2015    No results found.  Assessment & Plan:   There are no diagnoses linked to this encounter.   No orders of the defined types were placed in this encounter.    Follow-up: No follow-ups on file.  Walker Kehr, MD

## 2017-08-17 NOTE — Assessment & Plan Note (Addendum)
BP Readings from Last 3 Encounters:  08/17/17 (!) 142/86  06/29/17 132/74  06/08/17 (!) 166/72    Labetalol 1 300 mg bid, Amlodipine 2.5 qd

## 2017-08-23 DIAGNOSIS — R300 Dysuria: Secondary | ICD-10-CM | POA: Diagnosis not present

## 2017-08-29 ENCOUNTER — Other Ambulatory Visit (INDEPENDENT_AMBULATORY_CARE_PROVIDER_SITE_OTHER): Payer: PPO

## 2017-08-29 DIAGNOSIS — R7989 Other specified abnormal findings of blood chemistry: Secondary | ICD-10-CM

## 2017-08-29 DIAGNOSIS — E785 Hyperlipidemia, unspecified: Secondary | ICD-10-CM | POA: Diagnosis not present

## 2017-08-29 LAB — BASIC METABOLIC PANEL
BUN: 31 mg/dL — AB (ref 6–23)
CALCIUM: 9.1 mg/dL (ref 8.4–10.5)
CO2: 23 meq/L (ref 19–32)
CREATININE: 2.14 mg/dL — AB (ref 0.40–1.50)
Chloride: 106 mEq/L (ref 96–112)
GFR: 31.75 mL/min — AB (ref >60.00)
GLUCOSE: 108 mg/dL — AB (ref 70–99)
Potassium: 4.8 mEq/L (ref 3.5–5.1)
SODIUM: 137 meq/L (ref 135–145)

## 2017-08-29 LAB — HEPATIC FUNCTION PANEL
ALT: 11 U/L (ref 0–53)
AST: 10 U/L (ref 0–37)
Albumin: 3.5 g/dL (ref 3.5–5.2)
Alkaline Phosphatase: 57 U/L (ref 39–117)
BILIRUBIN TOTAL: 0.4 mg/dL (ref 0.2–1.2)
Bilirubin, Direct: 0.1 mg/dL (ref 0.0–0.3)
Total Protein: 6.5 g/dL (ref 6.0–8.3)

## 2017-08-29 LAB — LIPID PANEL
CHOL/HDL RATIO: 6
CHOLESTEROL: 211 mg/dL — AB (ref 0–200)
HDL: 37.3 mg/dL — AB (ref >39.00)
LDL CALC: 146 mg/dL — AB (ref 0–99)
NonHDL: 173.72
TRIGLYCERIDES: 138 mg/dL (ref 0.0–149.0)
VLDL: 27.6 mg/dL (ref 0.0–40.0)

## 2017-08-29 LAB — TSH: TSH: 1.69 u[IU]/mL (ref 0.35–4.50)

## 2017-08-31 DIAGNOSIS — N401 Enlarged prostate with lower urinary tract symptoms: Secondary | ICD-10-CM | POA: Diagnosis not present

## 2017-08-31 DIAGNOSIS — R300 Dysuria: Secondary | ICD-10-CM | POA: Diagnosis not present

## 2017-08-31 DIAGNOSIS — R3914 Feeling of incomplete bladder emptying: Secondary | ICD-10-CM | POA: Diagnosis not present

## 2017-09-06 ENCOUNTER — Inpatient Hospital Stay (HOSPITAL_COMMUNITY)
Admission: EM | Admit: 2017-09-06 | Discharge: 2017-09-20 | DRG: 871 | Disposition: A | Discharge status: Home Health | Payer: PPO | Attending: Internal Medicine | Admitting: Internal Medicine

## 2017-09-06 ENCOUNTER — Encounter (HOSPITAL_COMMUNITY): Payer: Self-pay | Admitting: *Deleted

## 2017-09-06 ENCOUNTER — Other Ambulatory Visit: Payer: Self-pay

## 2017-09-06 DIAGNOSIS — E785 Hyperlipidemia, unspecified: Secondary | ICD-10-CM | POA: Diagnosis not present

## 2017-09-06 DIAGNOSIS — G459 Transient cerebral ischemic attack, unspecified: Secondary | ICD-10-CM | POA: Diagnosis not present

## 2017-09-06 DIAGNOSIS — N183 Chronic kidney disease, stage 3 (moderate): Secondary | ICD-10-CM | POA: Diagnosis present

## 2017-09-06 DIAGNOSIS — A4151 Sepsis due to Escherichia coli [E. coli]: Secondary | ICD-10-CM | POA: Diagnosis not present

## 2017-09-06 DIAGNOSIS — Z79899 Other long term (current) drug therapy: Secondary | ICD-10-CM

## 2017-09-06 DIAGNOSIS — R0602 Shortness of breath: Secondary | ICD-10-CM | POA: Diagnosis not present

## 2017-09-06 DIAGNOSIS — N3081 Other cystitis with hematuria: Secondary | ICD-10-CM | POA: Diagnosis not present

## 2017-09-06 DIAGNOSIS — Z1629 Resistance to other single specified antibiotic: Secondary | ICD-10-CM | POA: Diagnosis not present

## 2017-09-06 DIAGNOSIS — E43 Unspecified severe protein-calorie malnutrition: Secondary | ICD-10-CM | POA: Diagnosis present

## 2017-09-06 DIAGNOSIS — N401 Enlarged prostate with lower urinary tract symptoms: Secondary | ICD-10-CM | POA: Diagnosis not present

## 2017-09-06 DIAGNOSIS — N12 Tubulo-interstitial nephritis, not specified as acute or chronic: Secondary | ICD-10-CM | POA: Diagnosis not present

## 2017-09-06 DIAGNOSIS — B952 Enterococcus as the cause of diseases classified elsewhere: Secondary | ICD-10-CM | POA: Diagnosis present

## 2017-09-06 DIAGNOSIS — I129 Hypertensive chronic kidney disease with stage 1 through stage 4 chronic kidney disease, or unspecified chronic kidney disease: Secondary | ICD-10-CM | POA: Diagnosis present

## 2017-09-06 DIAGNOSIS — R339 Retention of urine, unspecified: Secondary | ICD-10-CM | POA: Diagnosis not present

## 2017-09-06 DIAGNOSIS — R338 Other retention of urine: Secondary | ICD-10-CM | POA: Diagnosis present

## 2017-09-06 DIAGNOSIS — N3000 Acute cystitis without hematuria: Secondary | ICD-10-CM | POA: Diagnosis not present

## 2017-09-06 DIAGNOSIS — I10 Essential (primary) hypertension: Secondary | ICD-10-CM

## 2017-09-06 DIAGNOSIS — N452 Orchitis: Secondary | ICD-10-CM | POA: Diagnosis not present

## 2017-09-06 DIAGNOSIS — R0789 Other chest pain: Secondary | ICD-10-CM | POA: Diagnosis not present

## 2017-09-06 DIAGNOSIS — N390 Urinary tract infection, site not specified: Secondary | ICD-10-CM | POA: Diagnosis not present

## 2017-09-06 DIAGNOSIS — M1000 Idiopathic gout, unspecified site: Secondary | ICD-10-CM | POA: Diagnosis not present

## 2017-09-06 DIAGNOSIS — N453 Epididymo-orchitis: Secondary | ICD-10-CM | POA: Diagnosis not present

## 2017-09-06 DIAGNOSIS — E039 Hypothyroidism, unspecified: Secondary | ICD-10-CM | POA: Diagnosis not present

## 2017-09-06 DIAGNOSIS — B9620 Unspecified Escherichia coli [E. coli] as the cause of diseases classified elsewhere: Secondary | ICD-10-CM | POA: Diagnosis not present

## 2017-09-06 DIAGNOSIS — D72829 Elevated white blood cell count, unspecified: Secondary | ICD-10-CM | POA: Diagnosis present

## 2017-09-06 DIAGNOSIS — Z87440 Personal history of urinary (tract) infections: Secondary | ICD-10-CM | POA: Diagnosis not present

## 2017-09-06 DIAGNOSIS — Z1611 Resistance to penicillins: Secondary | ICD-10-CM | POA: Diagnosis not present

## 2017-09-06 DIAGNOSIS — Z6823 Body mass index (BMI) 23.0-23.9, adult: Secondary | ICD-10-CM

## 2017-09-06 DIAGNOSIS — N179 Acute kidney failure, unspecified: Secondary | ICD-10-CM | POA: Diagnosis present

## 2017-09-06 DIAGNOSIS — I2510 Atherosclerotic heart disease of native coronary artery without angina pectoris: Secondary | ICD-10-CM | POA: Diagnosis not present

## 2017-09-06 DIAGNOSIS — I6529 Occlusion and stenosis of unspecified carotid artery: Secondary | ICD-10-CM | POA: Diagnosis present

## 2017-09-06 DIAGNOSIS — Z1623 Resistance to quinolones and fluoroquinolones: Secondary | ICD-10-CM | POA: Diagnosis present

## 2017-09-06 DIAGNOSIS — N3289 Other specified disorders of bladder: Secondary | ICD-10-CM | POA: Diagnosis present

## 2017-09-06 DIAGNOSIS — R310 Gross hematuria: Secondary | ICD-10-CM | POA: Diagnosis not present

## 2017-09-06 DIAGNOSIS — H9192 Unspecified hearing loss, left ear: Secondary | ICD-10-CM | POA: Diagnosis not present

## 2017-09-06 DIAGNOSIS — K5730 Diverticulosis of large intestine without perforation or abscess without bleeding: Secondary | ICD-10-CM | POA: Diagnosis not present

## 2017-09-06 DIAGNOSIS — M109 Gout, unspecified: Secondary | ICD-10-CM | POA: Diagnosis present

## 2017-09-06 DIAGNOSIS — R300 Dysuria: Secondary | ICD-10-CM | POA: Diagnosis not present

## 2017-09-06 DIAGNOSIS — N3001 Acute cystitis with hematuria: Secondary | ICD-10-CM | POA: Diagnosis present

## 2017-09-06 DIAGNOSIS — R7881 Bacteremia: Secondary | ICD-10-CM | POA: Diagnosis not present

## 2017-09-06 DIAGNOSIS — Z79890 Hormone replacement therapy (postmenopausal): Secondary | ICD-10-CM | POA: Diagnosis not present

## 2017-09-06 DIAGNOSIS — R3914 Feeling of incomplete bladder emptying: Secondary | ICD-10-CM | POA: Diagnosis not present

## 2017-09-06 DIAGNOSIS — Z452 Encounter for adjustment and management of vascular access device: Secondary | ICD-10-CM | POA: Diagnosis not present

## 2017-09-06 DIAGNOSIS — Z66 Do not resuscitate: Secondary | ICD-10-CM | POA: Diagnosis not present

## 2017-09-06 DIAGNOSIS — R5383 Other fatigue: Secondary | ICD-10-CM | POA: Diagnosis not present

## 2017-09-06 DIAGNOSIS — N189 Chronic kidney disease, unspecified: Secondary | ICD-10-CM | POA: Diagnosis not present

## 2017-09-06 DIAGNOSIS — R350 Frequency of micturition: Secondary | ICD-10-CM | POA: Diagnosis not present

## 2017-09-06 DIAGNOSIS — K5900 Constipation, unspecified: Secondary | ICD-10-CM | POA: Diagnosis not present

## 2017-09-06 DIAGNOSIS — R112 Nausea with vomiting, unspecified: Secondary | ICD-10-CM | POA: Diagnosis not present

## 2017-09-06 DIAGNOSIS — N400 Enlarged prostate without lower urinary tract symptoms: Secondary | ICD-10-CM | POA: Diagnosis present

## 2017-09-06 DIAGNOSIS — R109 Unspecified abdominal pain: Secondary | ICD-10-CM | POA: Diagnosis present

## 2017-09-06 DIAGNOSIS — R079 Chest pain, unspecified: Secondary | ICD-10-CM | POA: Diagnosis not present

## 2017-09-06 DIAGNOSIS — Z8673 Personal history of transient ischemic attack (TIA), and cerebral infarction without residual deficits: Secondary | ICD-10-CM | POA: Diagnosis not present

## 2017-09-06 LAB — URINALYSIS, ROUTINE W REFLEX MICROSCOPIC
BILIRUBIN URINE: NEGATIVE
Glucose, UA: NEGATIVE mg/dL
Ketones, ur: 5 mg/dL — AB
Nitrite: POSITIVE — AB
Protein, ur: >=300 mg/dL — AB
RBC / HPF: >50 RBC/hpf — ABNORMAL HIGH (ref 0–5)
SPECIFIC GRAVITY, URINE: 1.013 (ref 1.005–1.030)
WBC, UA: >50 WBC/hpf — ABNORMAL HIGH (ref 0–5)
pH: 5 (ref 5.0–8.0)

## 2017-09-06 LAB — CBC WITH DIFFERENTIAL/PLATELET
Basophils Absolute: 0 10*3/uL (ref 0.0–0.1)
Basophils Relative: 0 %
Eosinophils Absolute: 0 10*3/uL (ref 0.0–0.7)
Eosinophils Relative: 0 %
HEMATOCRIT: 36.4 % — AB (ref 39.0–52.0)
Hemoglobin: 11.7 g/dL — ABNORMAL LOW (ref 13.0–17.0)
LYMPHS ABS: 0.7 10*3/uL (ref 0.7–4.0)
LYMPHS PCT: 4 %
MCH: 29.2 pg (ref 26.0–34.0)
MCHC: 32.1 g/dL (ref 30.0–36.0)
MCV: 90.8 fL (ref 78.0–100.0)
MONO ABS: 1.3 10*3/uL — AB (ref 0.1–1.0)
MONOS PCT: 9 %
NEUTROS ABS: 13.2 10*3/uL — AB (ref 1.7–7.7)
Neutrophils Relative %: 87 %
Platelets: 196 10*3/uL (ref 150–400)
RBC: 4.01 MIL/uL — ABNORMAL LOW (ref 4.22–5.81)
RDW: 13.5 % (ref 11.5–15.5)
WBC: 15.2 10*3/uL — ABNORMAL HIGH (ref 4.0–10.5)

## 2017-09-06 LAB — COMPREHENSIVE METABOLIC PANEL
ALBUMIN: 2.9 g/dL — AB (ref 3.5–5.0)
ALT: 12 U/L (ref 0–44)
ANION GAP: 9 (ref 5–15)
AST: 17 U/L (ref 15–41)
Alkaline Phosphatase: 77 U/L (ref 38–126)
BUN: 41 mg/dL — ABNORMAL HIGH (ref 8–23)
CHLORIDE: 109 mmol/L (ref 98–111)
CO2: 20 mmol/L — AB (ref 22–32)
Calcium: 8.6 mg/dL — ABNORMAL LOW (ref 8.9–10.3)
Creatinine, Ser: 3.01 mg/dL — ABNORMAL HIGH (ref 0.61–1.24)
GFR calc Af Amer: 21 mL/min — ABNORMAL LOW (ref >60)
GFR calc non Af Amer: 18 mL/min — ABNORMAL LOW (ref >60)
GLUCOSE: 115 mg/dL — AB (ref 70–99)
POTASSIUM: 4.9 mmol/L (ref 3.5–5.1)
SODIUM: 138 mmol/L (ref 135–145)
Total Bilirubin: 1 mg/dL (ref 0.3–1.2)
Total Protein: 6.5 g/dL (ref 6.5–8.1)

## 2017-09-06 LAB — I-STAT CG4 LACTIC ACID, ED: LACTIC ACID, VENOUS: 1.35 mmol/L (ref 0.5–1.9)

## 2017-09-06 MED ORDER — AMLODIPINE BESYLATE 5 MG PO TABS
2.5000 mg | ORAL_TABLET | Freq: Every day | ORAL | Status: DC
  Administered 2017-09-06 – 2017-09-12 (×7): 2.5 mg via ORAL
  Filled 2017-09-06 (×7): qty 1

## 2017-09-06 MED ORDER — FENTANYL CITRATE (PF) 100 MCG/2ML IJ SOLN
50.0000 ug | Freq: Once | INTRAMUSCULAR | Status: AC
  Administered 2017-09-06: 50 ug via INTRAVENOUS
  Filled 2017-09-06: qty 2

## 2017-09-06 MED ORDER — PRAVASTATIN SODIUM 20 MG PO TABS
20.0000 mg | ORAL_TABLET | Freq: Every day | ORAL | Status: DC
  Administered 2017-09-07 – 2017-09-20 (×14): 20 mg via ORAL
  Filled 2017-09-06 (×14): qty 1

## 2017-09-06 MED ORDER — ALFUZOSIN HCL ER 10 MG PO TB24
10.0000 mg | ORAL_TABLET | Freq: Every day | ORAL | Status: DC
  Administered 2017-09-07 – 2017-09-20 (×14): 10 mg via ORAL
  Filled 2017-09-06 (×14): qty 1

## 2017-09-06 MED ORDER — ENOXAPARIN SODIUM 30 MG/0.3ML ~~LOC~~ SOLN
30.0000 mg | SUBCUTANEOUS | Status: DC
  Administered 2017-09-06: 30 mg via SUBCUTANEOUS
  Filled 2017-09-06: qty 0.3

## 2017-09-06 MED ORDER — LABETALOL HCL 300 MG PO TABS
300.0000 mg | ORAL_TABLET | Freq: Two times a day (BID) | ORAL | Status: DC
  Administered 2017-09-06 – 2017-09-20 (×28): 300 mg via ORAL
  Filled 2017-09-06 (×28): qty 1

## 2017-09-06 MED ORDER — HYDRALAZINE HCL 20 MG/ML IJ SOLN: 5.0000 mg | Freq: Four times a day (QID) | INTRAMUSCULAR | Status: DC | PRN

## 2017-09-06 MED ORDER — ONDANSETRON HCL 4 MG/2ML IJ SOLN
4.0000 mg | Freq: Once | INTRAMUSCULAR | Status: AC
  Administered 2017-09-06: 4 mg via INTRAVENOUS
  Filled 2017-09-06: qty 2

## 2017-09-06 MED ORDER — PIPERACILLIN-TAZOBACTAM 3.375 G IVPB
3.3750 g | Freq: Three times a day (TID) | INTRAVENOUS | Status: DC
  Administered 2017-09-06 – 2017-09-09 (×8): 3.375 g via INTRAVENOUS
  Filled 2017-09-06 (×8): qty 50

## 2017-09-06 MED ORDER — VITAMIN D3 25 MCG (1000 UNIT) PO TABS
2000.0000 [IU] | ORAL_TABLET | Freq: Every day | ORAL | Status: DC
  Administered 2017-09-07 – 2017-09-20 (×14): 2000 [IU] via ORAL
  Filled 2017-09-06 (×14): qty 2

## 2017-09-06 MED ORDER — SODIUM CHLORIDE 0.9 % IV BOLUS
1000.0000 mL | Freq: Once | INTRAVENOUS | Status: AC
  Administered 2017-09-06: 1000 mL via INTRAVENOUS

## 2017-09-06 MED ORDER — FINASTERIDE 5 MG PO TABS
5.0000 mg | ORAL_TABLET | Freq: Every day | ORAL | Status: DC
  Administered 2017-09-07 – 2017-09-20 (×14): 5 mg via ORAL
  Filled 2017-09-06 (×14): qty 1

## 2017-09-06 MED ORDER — ACETAMINOPHEN 650 MG RE SUPP: 650.0000 mg | Freq: Four times a day (QID) | RECTAL | Status: DC | PRN

## 2017-09-06 MED ORDER — LEVOTHYROXINE SODIUM 88 MCG PO TABS
88.0000 ug | ORAL_TABLET | Freq: Every day | ORAL | Status: DC
  Administered 2017-09-07 – 2017-09-20 (×14): 88 ug via ORAL
  Filled 2017-09-06 (×14): qty 1

## 2017-09-06 MED ORDER — PIPERACILLIN-TAZOBACTAM 3.375 G IVPB 30 MIN
3.3750 g | Freq: Once | INTRAVENOUS | Status: AC
  Administered 2017-09-06: 3.375 g via INTRAVENOUS
  Filled 2017-09-06: qty 50

## 2017-09-06 MED ORDER — SODIUM CHLORIDE 0.9 % IV SOLN
Freq: Once | INTRAVENOUS | Status: AC
  Administered 2017-09-06: 21:00:00 via INTRAVENOUS

## 2017-09-06 MED ORDER — ACETAMINOPHEN 325 MG PO TABS
650.0000 mg | ORAL_TABLET | Freq: Four times a day (QID) | ORAL | Status: DC | PRN
  Administered 2017-09-06 – 2017-09-15 (×10): 650 mg via ORAL
  Filled 2017-09-06 (×10): qty 2

## 2017-09-06 MED ORDER — SODIUM CHLORIDE 0.9 % IV SOLN: 2.0000 g | Freq: Once | INTRAVENOUS | Status: DC

## 2017-09-06 NOTE — Progress Notes (Signed)
A consult was received from an ED physician for Zosyn per pharmacy dosing (for an indication other than meningitis). The patient's profile has been reviewed for ht/wt/allergies/indication/available labs. A one time order has been placed for the above antibiotics.  Further antibiotics/pharmacy consults should be ordered by admitting physician if indicated.                       Reuel Boom, PharmD, BCPS [phone] 09/06/2017, 3:43 PM

## 2017-09-06 NOTE — Progress Notes (Signed)
Pharmacy Antibiotic Note  Kenneth Williams is a 82 y.o. male admitted on 09/06/2017 with UTI.  Patient has Hx pseudomonal infections. 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 Pharmacy has been consulted for Zosyn dosing.  Plan:  Zosyn 3.375 g IV given once over 30 minutes, then every 8 hrs by 4-hr infusion  F/u SCr closely  Height: 6\' 2"  (188 cm) Weight: 189 lb 12.8 oz (86.1 kg) IBW/kg (Calculated) : 82.2  Temp (24hrs), Avg:98.9 F (37.2 C), Min:98.4 F (36.9 C), Max:99.3 F (37.4 C)  Recent Labs  Lab 09/06/17 1327 09/06/17 1334  WBC 15.2*  --   CREATININE 3.01*  --   LATICACIDVEN  --  1.35    Estimated Creatinine Clearance: 22.8 mL/min (A) (by C-G formula based on SCr of 3.01 mg/dL (H)).    Allergies  Allergen Reactions  . Lipitor [Atorvastatin]     diarrhea  . Spironolactone     gynecomastia  . Zocor [Simvastatin]     achy  . Coreg [Carvedilol] Diarrhea    Diarrhea and bradycardia  . Donepezil Hydrochloride Diarrhea    REACTION: diarrhea  . Hydrochlorothiazide Other (See Comments)    REACTION: Gout  . Razadyne [Galantamine Hydrobromide] Diarrhea    diarrhea    Dose adjustments this admission: n/a  Microbiology results: 7/16 BCx: sent 7/16 UCx: sent   Thank you for allowing pharmacy to be a part of this patient's care.  Reuel Boom, PharmD, BCPS [phone] 09/06/2017, 10:03 PM

## 2017-09-06 NOTE — H&P (Signed)
History and Physical    Kenneth Williams BZJ:[id number] DOB: 1935/09/13 DOA: 09/06/2017  PCP: Cassandria Anger, MD Patient coming from: Home  Chief Complaint: Gross hematuria, fevers  HPI: Kenneth Williams is a 82 y.o. male with medical history significant of BPH, hypertension, TIA, CKD stage III.  Patient reports a one-week history of gross hematuria.  He has persistent symptoms of dysuria, hesitancy, frequency.  He self caths secondary to urinary retention.  He reports gross hematuria with clots that have improved over the past few days, although he has had associated fever, chills, muscle aches.  He stopped self cathing after he started having symptoms of hematuria.  He did not try anything else to help with his symptoms.  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  ED Course: Vitals: Afebrile, slightly hypertensive, normal pulse, normal respirations Labs: Glucose of 115, BUN of 41, creatinine of 3.01, WBC of 15.2K Imaging: None obtained.  Per discussion with the EDP and urologist on-call, evidence of emphysematous cystitis on outpatient CT scan Medications/Course: Zosyn given.  Blood cultures obtained prior to antibiotic therapy.  Urine cultures obtained after antibiotic therapy.  Review of Systems: Review of Systems  Constitutional: Positive for chills, fever and malaise/fatigue.  Gastrointestinal: Negative for abdominal pain, diarrhea, nausea and vomiting.  Genitourinary: Positive for dysuria, frequency, hematuria and urgency.  Musculoskeletal: Positive for myalgias.  All other systems reviewed and are negative.   Past Medical History:  Diagnosis Date  . BPH (benign prostatic hyperplasia)   . CAD (coronary artery disease)    cath 2011-mild non obstructive  . Elevated PSA    history of   . Gout    2009  . History of diverticulitis of colon   . History of pancreatitis   . History of TIAs     . Hyperlipidemia   . Hypertension   . Hypothyroidism   . Hypothyroidism   . Left ear hearing loss    wears right hearing aid  . Personal history of hyperthyroidism 2006   s/p 131I- Dr. Chalmers Cater, Hyperthyroid  . Renal insufficiency 2011  . TGA (transient global amnesia)     Past Surgical History:  Procedure Laterality Date  . CHOLECYSTECTOMY    . COLONOSCOPY    . STAPEDES SURGERY     LEFT EAR     reports that he has never smoked. He has never used smokeless tobacco. He reports that he does not drink alcohol or use drugs.  Allergies  Allergen Reactions  . Lipitor [Atorvastatin]     diarrhea  . Spironolactone     gynecomastia  . Zocor [Simvastatin]     achy  . Coreg [Carvedilol] Diarrhea    Diarrhea and bradycardia  . Donepezil Hydrochloride Diarrhea    REACTION: diarrhea  . Hydrochlorothiazide Other (See Comments)    REACTION: Gout  . Razadyne [Galantamine Hydrobromide] Diarrhea    diarrhea    Family History  Problem Relation Age of Onset  . Heart disease Sister   . Dementia Mother   . Hypertension Other   . Colon cancer Neg Hx   . Stomach cancer Neg Hx   . Esophageal cancer Neg Hx   . Rectal cancer Neg Hx     Prior to Admission medications   Medication Sig Start Date End Date Taking? Authorizing Provider  acetaminophen (TYLENOL) 500 MG tablet Take 500 mg by mouth 2 (two) times daily as needed for mild  pain.   Yes [provider]  alfuzosin (UROXATRAL) 10 MG 24 hr tablet TAKE 1 TABLET (10 MG TOTAL) BY MOUTH DAILY WITH BREAKFAST. 06/06/17  Yes Plotnikov, Evie Lacks, MD  amLODipine (NORVASC) 2.5 MG tablet Take 1 tablet (2.5 mg total) by mouth daily. 12/02/16  Yes Plotnikov, Evie Lacks, MD  cholecalciferol (VITAMIN D) 1000 UNITS tablet Take 2,000 Units by mouth daily.     Yes [provider]  clotrimazole-betamethasone (LOTRISONE) cream Apply 1 application topically 2 (two) times daily. 04/01/17  Yes Plotnikov, Evie Lacks, MD  colchicine 0.6 MG tablet  Take 1 tablet (0.6 mg total) by mouth 3 (three) times daily as needed. For gout 07/21/16  Yes Plotnikov, Evie Lacks, MD  Cyanocobalamin (VITAMIN B 12 PO) Take 1 tablet by mouth daily.     Yes [provider]  labetalol (NORMODYNE) 300 MG tablet Take 1 tablet (300 mg total) by mouth 2 (two) times daily. 08/17/17  Yes Plotnikov, Evie Lacks, MD  levothyroxine (SYNTHROID, LEVOTHROID) 88 MCG tablet TAKE 1 TABLET (88 MCG TOTAL) BY MOUTH DAILY. 05/06/17  Yes Plotnikov, Evie Lacks, MD  pravastatin (PRAVACHOL) 20 MG tablet Take 1 tablet (20 mg total) by mouth daily. 08/17/17  Yes Plotnikov, Evie Lacks, MD  finasteride (PROSCAR) 5 MG tablet TAKE 1 TABLET (5 MG TOTAL) BY MOUTH DAILY. 06/30/17 06/30/18  Plotnikov, Evie Lacks, MD  loperamide (IMODIUM A-D) 2 MG tablet Take 1 tablet (2 mg total) by mouth 4 (four) times daily as needed for diarrhea or loose stools. Patient not taking: Reported on 09/06/2017 04/04/15   Plotnikov, Evie Lacks, MD    Physical Exam:  Physical Exam  Constitutional: He is oriented to person, place, and time. He appears well-developed and well-nourished. No distress.  HENT:  Mouth/Throat: Oropharynx is clear and moist.  Eyes: Pupils are equal, round, and reactive to light. Conjunctivae and EOM are normal.  Neck: Normal range of motion.  Cardiovascular: Normal rate, regular rhythm and normal heart sounds.  No murmur heard. Pulmonary/Chest: Effort normal and breath sounds normal. No respiratory distress. He has no wheezes. He has no rales.  Abdominal: Soft. Normal appearance and bowel sounds are normal. He exhibits no distension. There is no tenderness. There is no rebound and no guarding.  Musculoskeletal: Normal range of motion. He exhibits no edema or tenderness.  Lymphadenopathy:    He has no cervical adenopathy.  Neurological: He is alert and oriented to person, place, and time.  Skin: Skin is warm and dry. He is not diaphoretic.  Psychiatric: He has a normal mood and affect.    Vitals reviewed.    Labs on Admission: I have personally reviewed following labs and imaging studies  CBC: Recent Labs  Lab 09/06/17 1327  WBC 15.2*  NEUTROABS 13.2*  HGB 11.7*  HCT 36.4*  MCV 90.8  PLT 841    Basic Metabolic Panel: Recent Labs  Lab 09/06/17 1327  NA 138  K 4.9  CL 109  CO2 20*  GLUCOSE 115*  BUN 41*  CREATININE 3.01*  CALCIUM 8.6*    GFR: Estimated Creatinine Clearance: 22.8 mL/min (A) (by C-G formula based on SCr of 3.01 mg/dL (H)).  Liver Function Tests: Recent Labs  Lab 09/06/17 1327  AST 17  ALT 12  ALKPHOS 77  BILITOT 1.0  PROT 6.5  ALBUMIN 2.9*   No results for input(s): LIPASE, AMYLASE in the last 168 hours. No results for input(s): AMMONIA in the last 168 hours.  Coagulation Profile: No results for  input(s): INR, PROTIME in the last 168 hours.  Cardiac Enzymes: No results for input(s): CKTOTAL, CKMB, CKMBINDEX, TROPONINI in the last 168 hours.  BNP (last 3 results) No results for input(s): PROBNP in the last 8760 hours.  HbA1C: No results for input(s): HGBA1C in the last 72 hours.  CBG: No results for input(s): GLUCAP in the last 168 hours.  Lipid Profile: No results for input(s): CHOL, HDL, LDLCALC, TRIG, CHOLHDL, LDLDIRECT in the last 72 hours.  Thyroid Function Tests: No results for input(s): TSH, T4TOTAL, FREET4, T3FREE, THYROIDAB in the last 72 hours.  Anemia Panel: No results for input(s): VITAMINB12, FOLATE, FERRITIN, TIBC, IRON, RETICCTPCT in the last 72 hours.  Urine analysis:    Component Value Date/Time   COLORURINE RED (A) 06/07/2017 2031   APPEARANCEUR TURBID (A) 06/07/2017 2031   LABSPEC  06/07/2017 2031    TEST NOT REPORTED DUE TO COLOR INTERFERENCE OF URINE PIGMENT   PHURINE  06/07/2017 2031    TEST NOT REPORTED DUE TO COLOR INTERFERENCE OF URINE PIGMENT   GLUCOSEU (A) 06/07/2017 2031    TEST NOT REPORTED DUE TO COLOR INTERFERENCE OF URINE PIGMENT   GLUCOSEU NEGATIVE 03/03/2017 1215    HGBUR (A) 06/07/2017 2031    TEST NOT REPORTED DUE TO COLOR INTERFERENCE OF URINE PIGMENT   BILIRUBINUR (A) 06/07/2017 2031    TEST NOT REPORTED DUE TO COLOR INTERFERENCE OF URINE PIGMENT   KETONESUR (A) 06/07/2017 2031    TEST NOT REPORTED DUE TO COLOR INTERFERENCE OF URINE PIGMENT   PROTEINUR (A) 06/07/2017 2031    TEST NOT REPORTED DUE TO COLOR INTERFERENCE OF URINE PIGMENT   UROBILINOGEN 0.2 03/03/2017 1215   NITRITE (A) 06/07/2017 2031    TEST NOT REPORTED DUE TO COLOR INTERFERENCE OF URINE PIGMENT   LEUKOCYTESUR (A) 06/07/2017 2031    TEST NOT REPORTED DUE TO COLOR INTERFERENCE OF URINE PIGMENT     Radiological Exams on Admission: No results found.   Assessment/Plan Active Problems:   Hypothyroidism   TIA (transient ischemic attack)   Carotid artery stenosis   BPH (benign prostatic hyperplasia)   UTI (urinary tract infection)   Urine tract infection with hematuria Patient has a history of Pseudomonas UTI.  Although patient has fevers and chills, outpatient CT scan without evidence of pyelonephritis.  -Continue Zosyn -Blood cultures pending -Urine culture pending from urology office -Urine culture pending from ED (obtained after antibiotics) -Repeat CBC in AM, if trending down, consider discontinuing chemical VTE prophylaxis  Acute kidney injury on CKD stage III -IV fluids x1 -Repeat BMP in AM  Hypothyroidism -Continue Synthroid  BPH Contributing to current presentation -Continue finasteride and alfuzosin  Hypertension Uncontrolled. -Continue home amlodipine and labetalol -Hydralazine prn  History of TIA -Continue Pravastatin   DVT prophylaxis: Lovenox, but may need to discontinue if hemoglobin is dropping Code Status: DNR Family Communication: Daughter at bedside Disposition Plan: Discharge home likely in 2 days once urine/blood culture date is available Consults called: Urology Admission status: Inpatient, Medical floor   Cordelia Poche, MD [facility]  If 7PM-7AM, please contact night-coverage [email or web address] Password [email or web address]  09/06/2017, 7:27 PM

## 2017-09-06 NOTE — ED Triage Notes (Signed)
Pt sent from [facility] for urinary tract infection. Pt had Ct scan showing gas around lining of his bladder. [facility] recommended pt come for IV antibiotics.

## 2017-09-06 NOTE — ED Provider Notes (Signed)
[facility] DEPT Provider Note   CSN: [id number] Arrival date & time: 09/06/17  1239     History   Chief Complaint Chief Complaint  Patient presents with  . Urinary Tract Infection    HPI Kenneth Williams is a 82 y.o. male.  HPI   82 year old male with extensive past medical history as below here with UTI.  Patient has a history of enlarged prostate and self caths.  He has had frequent UTIs with Pseudomonas UTI several months ago.  He reports over the last several days, he has had chills, increased sediment, and increased hematuria in his urine.  He said subjective fevers.  He said nausea and now flank pain.  He states that he is been increasingly weak over the last several days.  He has an associated aching, cramping, intermittently severe lower abdominal and right greater than left flank pain.  He has [facility] in [facility] today, which showed possible gas-forming UTI.  A Foley catheter was placed and sent here for evaluation.  Denies any alleviating factors.  No confusion.  No chest pain or shortness of breath.  Past Medical History:  Diagnosis Date  . BPH (benign prostatic hyperplasia)   . CAD (coronary artery disease)    cath 2011-mild non obstructive  . Elevated PSA    history of   . Gout    2009  . History of diverticulitis of colon   . History of pancreatitis   . History of TIAs   . Hyperlipidemia   . Hypertension   . Hypothyroidism   . Hypothyroidism   . Left ear hearing loss    wears right hearing aid  . Personal history of hyperthyroidism 2006   s/p 131I- Dr. Chalmers Cater, Hyperthyroid  . Renal insufficiency 2011  . TGA (transient global amnesia)     Patient Active Problem List   Diagnosis Date Noted  . Urinary tract infection 06/29/2017  . Rash 04/01/2017  . Orthostatic hypotension 03/03/2017  . DOE (dyspnea on exertion) 03/03/2017  . Lactose intolerance 03/03/2017  . Gout 07/21/2016  . Dyslipidemia 07/21/2016  . Hoarseness  05/18/2016  . Gynecomastia 05/18/2016  . Fatty liver 02/06/2016  . Elevated serum creatinine 09/16/2015  . Preop exam for internal medicine 09/15/2015  . Physical deconditioning 06/18/2015  . BPH (benign prostatic hyperplasia) 06/02/2015  . Chest pain, atypical 01/27/2015  . Loss of weight 10/18/2014  . Scrotal rash 10/18/2014  . Depression 07/19/2014  . Neck pain on right side 06/24/2014  . Onychomycosis of toenail 04/15/2014  . Wart of scalp 03/10/2014  . Carotid artery stenosis 02/29/2012  . Diarrhea 11/23/2011  . Well adult exam 05/24/2011  . Elevated PSA 05/24/2011  . Cerumen impaction 05/24/2011  . Pseudogout 10/27/2010  . Foot pain, left 09/07/2010  . Neoplasm of uncertain behavior of skin 09/07/2010  . Apathy 09/07/2010  . Edema 07/17/2010  . Acute upper respiratory infection 05/05/2010  . HEADACHE 01/28/2010  . Hearing loss 12/10/2009  . HYPERKALEMIA 10/22/2009  . ABSCESS, AXILLA 10/22/2009  . Actinic keratosis 10/22/2009  . Disorder resulting from impaired renal function 08/14/2009  . ABNORMAL CV (STRESS) TEST 03/12/2009  . DYSPNEA ON EXERTION 02/20/2009  . FATIGUE 01/09/2008  . Hypothyroidism 03/03/2007  . Memory loss 03/03/2007  . HYPERLIPIDEMIA 10/13/2006  . ANEMIA-NOS 10/13/2006  . Essential hypertension 10/13/2006  . TIA (transient ischemic attack) 10/13/2006  . PANCREATITIS, HX OF 10/13/2006  . Diverticulitis of colon 10/13/2006    Past Surgical History:  Procedure Laterality  Date  . CHOLECYSTECTOMY    . COLONOSCOPY    . STAPEDES SURGERY     LEFT EAR        Home Medications    Prior to Admission medications   Medication Sig Start Date End Date Taking? Authorizing Provider  acetaminophen (TYLENOL) 500 MG tablet Take 500 mg by mouth 2 (two) times daily as needed for mild pain.   Yes [provider]  alfuzosin (UROXATRAL) 10 MG 24 hr tablet TAKE 1 TABLET (10 MG TOTAL) BY MOUTH DAILY WITH BREAKFAST. 06/06/17  Yes Plotnikov, Evie Lacks, MD   amLODipine (NORVASC) 2.5 MG tablet Take 1 tablet (2.5 mg total) by mouth daily. 12/02/16  Yes Plotnikov, Evie Lacks, MD  cholecalciferol (VITAMIN D) 1000 UNITS tablet Take 2,000 Units by mouth daily.     Yes [provider]  clotrimazole-betamethasone (LOTRISONE) cream Apply 1 application topically 2 (two) times daily. 04/01/17  Yes Plotnikov, Evie Lacks, MD  colchicine 0.6 MG tablet Take 1 tablet (0.6 mg total) by mouth 3 (three) times daily as needed. For gout 07/21/16  Yes Plotnikov, Evie Lacks, MD  Cyanocobalamin (VITAMIN B 12 PO) Take 1 tablet by mouth daily.     Yes [provider]  labetalol (NORMODYNE) 300 MG tablet Take 1 tablet (300 mg total) by mouth 2 (two) times daily. 08/17/17  Yes Plotnikov, Evie Lacks, MD  levothyroxine (SYNTHROID, LEVOTHROID) 88 MCG tablet TAKE 1 TABLET (88 MCG TOTAL) BY MOUTH DAILY. 05/06/17  Yes Plotnikov, Evie Lacks, MD  pravastatin (PRAVACHOL) 20 MG tablet Take 1 tablet (20 mg total) by mouth daily. 08/17/17  Yes Plotnikov, Evie Lacks, MD  finasteride (PROSCAR) 5 MG tablet TAKE 1 TABLET (5 MG TOTAL) BY MOUTH DAILY. 06/30/17 06/30/18  Plotnikov, Evie Lacks, MD  loperamide (IMODIUM A-D) 2 MG tablet Take 1 tablet (2 mg total) by mouth 4 (four) times daily as needed for diarrhea or loose stools. Patient not taking: Reported on 09/06/2017 04/04/15   Plotnikov, Evie Lacks, MD    Family History Family History  Problem Relation Age of Onset  . Heart disease Sister   . Dementia Mother   . Hypertension Other   . Colon cancer Neg Hx   . Stomach cancer Neg Hx   . Esophageal cancer Neg Hx   . Rectal cancer Neg Hx     Social History Social History   Tobacco Use  . Smoking status: Never Smoker  . Smokeless tobacco: Never Used  Substance Use Topics  . Alcohol use: No  . Drug use: No     Allergies   Lipitor [atorvastatin]; Spironolactone; Zocor [simvastatin]; Coreg [carvedilol]; Donepezil hydrochloride; Hydrochlorothiazide; and Razadyne [galantamine  hydrobromide]   Review of Systems Review of Systems  Constitutional: Positive for chills and fatigue.  Gastrointestinal: Positive for nausea and vomiting.  Genitourinary: Positive for difficulty urinating, dysuria, flank pain and hematuria.  All other systems reviewed and are negative.    Physical Exam Updated Vital Signs BP (!) 151/72   Pulse 78   Temp 98.4 F (36.9 C) (Oral)   Resp 18   Ht 6\' 2"  (1.88 m)   Wt 83.9 kg (185 lb)   SpO2 98%   BMI 23.75 kg/m   Physical Exam  Constitutional: He is oriented to person, place, and time. He appears well-developed and well-nourished. No distress.  HENT:  Head: Normocephalic and atraumatic.  Dry mucous membranes  Eyes: Conjunctivae are normal.  Neck: Neck supple.  Cardiovascular: Normal rate, regular rhythm and normal heart sounds.  Exam reveals no friction rub.  No murmur heard. Pulmonary/Chest: Effort normal and breath sounds normal. No respiratory distress. He has no wheezes. He has no rales.  Abdominal: Soft. He exhibits no distension. There is tenderness.  Mild suprapubic and right greater than left CVA tenderness  Genitourinary:  Genitourinary Comments: Foley catheter in place.  Moderate tenderness over the right testes, no signs of torsion.  Musculoskeletal: He exhibits no edema.  Neurological: He is alert and oriented to person, place, and time. He exhibits normal muscle tone.  Skin: Skin is warm. Capillary refill takes less than 2 seconds.  Psychiatric: He has a normal mood and affect.  Nursing note and vitals reviewed.    ED Treatments / Results  Labs (all labs ordered are listed, but only abnormal results are displayed) Labs Reviewed  COMPREHENSIVE METABOLIC PANEL - Abnormal; Notable for the following components:      Result Value   CO2 20 (*)    Glucose, Bld 115 (*)    BUN 41 (*)    Creatinine, Ser 3.01 (*)    Calcium 8.6 (*)    Albumin 2.9 (*)    GFR calc non Af Amer 18 (*)    GFR calc Af Amer 21 (*)     All other components within normal limits  CBC WITH DIFFERENTIAL/PLATELET - Abnormal; Notable for the following components:   WBC 15.2 (*)    RBC 4.01 (*)    Hemoglobin 11.7 (*)    HCT 36.4 (*)    Neutro Abs 13.2 (*)    Monocytes Absolute 1.3 (*)    All other components within normal limits  CULTURE, BLOOD (ROUTINE X 2)  CULTURE, BLOOD (ROUTINE X 2)  URINE CULTURE  URINALYSIS, ROUTINE W REFLEX MICROSCOPIC  I-STAT CG4 LACTIC ACID, ED    EKG None  Radiology No results found.  Procedures Procedures (including critical care time)  Medications Ordered in ED Medications  sodium chloride 0.9 % bolus 1,000 mL (1,000 mLs Intravenous New Bag/Given 09/06/17 1600)  piperacillin-tazobactam (ZOSYN) IVPB 3.375 g (3.375 g Intravenous New Bag/Given 09/06/17 1600)     Initial Impression / Assessment and Plan / ED Course  I have reviewed the triage vital signs and the nursing notes.  Pertinent labs & imaging results that were available during my care of the patient were reviewed by me and considered in my medical decision making (see chart for details).     29-year-old male here with possible emphysematous cystitis.  He has a significant leukocytosis but is afebrile and hemodynamically stable with no lactic acidosis or signs of severe sepsis.  I reviewed his previous cultures, which have grown Pseudomonas.  Zosyn given based on sensitivities.  He is Artie had a Foley placed by [facility].  Will admit to medicine.  Final Clinical Impressions(s) / ED Diagnoses   Final diagnoses:  Acute cystitis with hematuria  Pyelonephritis    ED Discharge Orders    None       Duffy Bruce, MD 09/06/17 [phone]

## 2017-09-06 NOTE — Consult Note (Signed)
This patient has a history of urinary retention which began approximately in April 2019.  He has failed several voiding trials in our office.  He was seen in our urology clinic today.  His Foley catheter was changed.  He was noted to have gross hematuria and a CT scan performed which demonstrated concern for emphysematous cystitis and right epididymal orchitis.  The exam confirmed his right enlarged and tender testicle.  The patient has a history of resistant Pseudomonas UTIs, and as such was referred to the emergency department for further treatment.   Urine cultures from Jun 23, 2017 are below:    Antimicrobial Susceptibility and Organism Identification Report *FINAL * Source : URINE CULTURE Iso/Result : 01 >100,000 C/ML Pseudomonas aeruginosa Antimicrobic/Dose MIC SYSTEMIC URINE --------------------------------------------------------------- Amikacin <16 S S Aztreonam 16 I I Ceftazidime 4 S S Ciprofloxacin >2 R R Cefepime 16 I I Gentamicin >8 R R Levofloxacin >4 R R Meropenem >8 R R Pip/Tazo <16 S S Tobramycin >8 R R  The recommendation would be to continue to treat the patient based on these cultures until the cultures are available that were obtained in our urology clinic today.  This would be Thursday or Friday.  We will certainly keep the team apprised of the culture results.  His catheter will remain in until the patient has proven that he can pass a voiding trial, I would not recommend that it be performed while he is in inpatient being treated for emphysematous cystitis/right epididymal orchitis.

## 2017-09-07 LAB — BLOOD CULTURE ID PANEL (REFLEXED)
Acinetobacter baumannii: NOT DETECTED
CANDIDA KRUSEI: NOT DETECTED
CARBAPENEM RESISTANCE: NOT DETECTED
Candida albicans: NOT DETECTED
Candida glabrata: NOT DETECTED
Candida parapsilosis: NOT DETECTED
Candida tropicalis: NOT DETECTED
ENTEROBACTERIACEAE SPECIES: DETECTED — AB
Enterobacter cloacae complex: NOT DETECTED
Enterococcus species: NOT DETECTED
Escherichia coli: DETECTED — AB
Haemophilus influenzae: NOT DETECTED
KLEBSIELLA OXYTOCA: NOT DETECTED
KLEBSIELLA PNEUMONIAE: NOT DETECTED
LISTERIA MONOCYTOGENES: NOT DETECTED
Neisseria meningitidis: NOT DETECTED
PROTEUS SPECIES: NOT DETECTED
PSEUDOMONAS AERUGINOSA: NOT DETECTED
STAPHYLOCOCCUS AUREUS BCID: NOT DETECTED
STREPTOCOCCUS PNEUMONIAE: NOT DETECTED
STREPTOCOCCUS PYOGENES: NOT DETECTED
STREPTOCOCCUS SPECIES: NOT DETECTED
Serratia marcescens: NOT DETECTED
Staphylococcus species: NOT DETECTED
Streptococcus agalactiae: NOT DETECTED

## 2017-09-07 LAB — COMPREHENSIVE METABOLIC PANEL
ALT: 15 U/L (ref 0–44)
AST: 15 U/L (ref 15–41)
Albumin: 2.3 g/dL — ABNORMAL LOW (ref 3.5–5.0)
Alkaline Phosphatase: 76 U/L (ref 38–126)
Anion gap: 8 (ref 5–15)
BUN: 43 mg/dL — ABNORMAL HIGH (ref 8–23)
CALCIUM: 8.2 mg/dL — AB (ref 8.9–10.3)
CO2: 23 mmol/L (ref 22–32)
CREATININE: 3.04 mg/dL — AB (ref 0.61–1.24)
Chloride: 109 mmol/L (ref 98–111)
GFR, EST AFRICAN AMERICAN: 21 mL/min — AB (ref >60)
GFR, EST NON AFRICAN AMERICAN: 18 mL/min — AB (ref >60)
Glucose, Bld: 122 mg/dL — ABNORMAL HIGH (ref 70–99)
Potassium: 3.8 mmol/L (ref 3.5–5.1)
SODIUM: 140 mmol/L (ref 135–145)
Total Bilirubin: 0.6 mg/dL (ref 0.3–1.2)
Total Protein: 5.5 g/dL — ABNORMAL LOW (ref 6.5–8.1)

## 2017-09-07 LAB — CBC
HCT: 29.9 % — ABNORMAL LOW (ref 39.0–52.0)
Hemoglobin: 9.5 g/dL — ABNORMAL LOW (ref 13.0–17.0)
MCH: 28 pg (ref 26.0–34.0)
MCHC: 31.8 g/dL (ref 30.0–36.0)
MCV: 88.2 fL (ref 78.0–100.0)
PLATELETS: 238 10*3/uL (ref 150–400)
RBC: 3.39 MIL/uL — AB (ref 4.22–5.81)
RDW: 13.5 % (ref 11.5–15.5)
WBC: 16.6 10*3/uL — ABNORMAL HIGH (ref 4.0–10.5)

## 2017-09-07 MED ORDER — TRAMADOL HCL 50 MG PO TABS
50.0000 mg | ORAL_TABLET | Freq: Two times a day (BID) | ORAL | Status: AC | PRN
  Administered 2017-09-07 – 2017-09-08 (×2): 50 mg via ORAL
  Filled 2017-09-07 (×2): qty 1

## 2017-09-07 MED ORDER — SODIUM CHLORIDE 0.9 % IV SOLN
Freq: Once | INTRAVENOUS | Status: AC
  Administered 2017-09-07: 10:00:00 via INTRAVENOUS

## 2017-09-07 MED ORDER — SODIUM CHLORIDE 0.9 % IV SOLN
INTRAVENOUS | Status: DC
  Administered 2017-09-07 (×2): via INTRAVENOUS

## 2017-09-07 MED ORDER — OXYBUTYNIN CHLORIDE 5 MG PO TABS
5.0000 mg | ORAL_TABLET | Freq: Three times a day (TID) | ORAL | Status: DC | PRN
  Administered 2017-09-07 – 2017-09-16 (×4): 5 mg via ORAL
  Filled 2017-09-07 (×4): qty 1

## 2017-09-07 NOTE — Plan of Care (Signed)
  Problem: Nutrition: Goal: Adequate nutrition will be maintained Outcome: Progressing   Problem: Elimination: Goal: Will not experience complications related to bowel motility Outcome: Progressing   Problem: Pain Managment: Goal: General experience of comfort will improve Outcome: Progressing   

## 2017-09-07 NOTE — Progress Notes (Signed)
[facility] Progress Note  Patient: Kenneth Williams PXT:[id number]   PCP: Cassandria Anger, MD DOB: Dec 11, 1935   DOA: 09/06/2017   DOS: 09/07/2017   Date of Service: the patient was seen and examined on 09/07/2017  Subjective: Feeling better.  No nausea no vomiting no fever no chills.  Brief hospital course: Pt. with PMH of BPH, hypertension, TIA, CKD stage III; admitted on 09/06/2017, presented with complaint of hematuria, was found to have sepsis secondary to E. coli UTI. Currently further plan is for headaches.  Assessment and Plan: 1.  Sepsis secondary to E. coli bacteremia and UTI. Hematuria. Indwelling Foley catheter. Chronic urinary retention. Urology consulted. Urine culture performed in the clinic. Blood culture performed here is positive for E. coli. Discussed with urology, patient has history of Pseudomonas as well as emphysematous cystitis and therefore they want to continue broad-spectrum antibiotics. Follow-up on culture. Repeat cultures tomorrow for blood.  Acute kidney injury on CKD stage III -IV fluids -Repeat BMP in AM  Hypothyroidism -Continue Synthroid  BPH Contributing to current presentation -Continue finasteride and alfuzosin  Hypertension Uncontrolled. -Continue home amlodipine and labetalol -Hydralazine prn  History of TIA -Continue Pravastatin  Diet: car modified diet DVT Prophylaxis: mechanical compression device  Advance goals of care discussion: DNR DNI  Family Communication: no family was present at bedside, at the time of interview.   Disposition:  Discharge to home.  Consultants: urology Procedures: none  Antibiotics: Anti-infectives (From admission, onward)   Start     Dose/Rate Route Frequency Ordered Stop   09/07/17 0000  piperacillin-tazobactam (ZOSYN) IVPB 3.375 g     3.375 g 12.5 mL/hr over 240 Minutes Intravenous Every 8 hours 09/06/17 2259     09/06/17 1600  ceFEPIme (MAXIPIME) 2 g in sodium chloride 0.9 %  100 mL IVPB  Status:  Discontinued     2 g 200 mL/hr over 30 Minutes Intravenous  Once 09/06/17 1528 09/06/17 1539   09/06/17 1545  piperacillin-tazobactam (ZOSYN) IVPB 3.375 g     3.375 g 100 mL/hr over 30 Minutes Intravenous  Once 09/06/17 1543 09/06/17 1644       Objective: Physical Exam: Vitals:   09/06/17 1924 09/06/17 2038 09/07/17 0612 09/07/17 1239  BP: (!) 166/73 (!) 165/81 (!) 160/71 (!) 151/69  Pulse: 81 81 78 72  Resp: 18 18 20    Temp:  99.3 F (37.4 C) (!) 100.8 F (38.2 C) 98.7 F (37.1 C)  TempSrc:  Oral    SpO2: 97% 97% 96% 94%  Weight:  86.1 kg (189 lb 12.8 oz)    Height:  6\' 2"  (1.88 m)      Intake/Output Summary (Last 24 hours) at 09/07/2017 1831 Last data filed at 09/07/2017 1643 Gross per 24 hour  Intake 638.75 ml  Output 1150 ml  Net -511.25 ml   Filed Weights   09/06/17 1252 09/06/17 2038  Weight: 83.9 kg (185 lb) 86.1 kg (189 lb 12.8 oz)   General: Alert, Awake and Oriented to Time, Place and Person. Appear in mild distress, affect appropriate Eyes: PERRL, Conjunctiva normal ENT: Oral Mucosa clear moist. Neck: no JVD, no Abnormal Mass Or lumps Cardiovascular: S1 and S2 Present, no Murmur, Peripheral Pulses Present Respiratory: normal respiratory effort, Bilateral Air entry equal and Decreased, no use of accessory muscle, Clear to Auscultation, no Crackles, no wheezes Abdomen: Bowel Sound present, Soft and no tenderness, no hernia Skin: no redness, no Rash, no induration Extremities: no Pedal edema, no calf tenderness Neurologic: Grossly no  focal neuro deficit. Bilaterally Equal motor strength  Data Reviewed: CBC: Recent Labs  Lab 09/06/17 1327 09/07/17 0422  WBC 15.2* 16.6*  NEUTROABS 13.2*  --   HGB 11.7* 9.5*  HCT 36.4* 29.9*  MCV 90.8 88.2  PLT 196 950   Basic Metabolic Panel: Recent Labs  Lab 09/06/17 1327 09/07/17 0422  NA 138 140  K 4.9 3.8  CL 109 109  CO2 20* 23  GLUCOSE 115* 122*  BUN 41* 43*  CREATININE 3.01*  3.04*  CALCIUM 8.6* 8.2*    Liver Function Tests: Recent Labs  Lab 09/06/17 1327 09/07/17 0422  AST 17 15  ALT 12 15  ALKPHOS 77 76  BILITOT 1.0 0.6  PROT 6.5 5.5*  ALBUMIN 2.9* 2.3*   No results for input(s): LIPASE, AMYLASE in the last 168 hours. No results for input(s): AMMONIA in the last 168 hours. Coagulation Profile: No results for input(s): INR, PROTIME in the last 168 hours. Cardiac Enzymes: No results for input(s): CKTOTAL, CKMB, CKMBINDEX, TROPONINI in the last 168 hours. BNP (last 3 results) No results for input(s): PROBNP in the last 8760 hours. CBG: No results for input(s): GLUCAP in the last 168 hours. Studies: No results found.  Scheduled Meds: . alfuzosin  10 mg Oral Q breakfast  . amLODipine  2.5 mg Oral Daily  . cholecalciferol  2,000 Units Oral Daily  . finasteride  5 mg Oral Daily  . labetalol  300 mg Oral BID  . levothyroxine  88 mcg Oral QAC breakfast  . pravastatin  20 mg Oral Daily   Continuous Infusions: . sodium chloride 100 mL/hr at 09/07/17 1607  . piperacillin-tazobactam (ZOSYN)  IV 3.375 g (09/07/17 1607)   PRN Meds: acetaminophen **OR** acetaminophen, hydrALAZINE, oxybutynin  Time spent: 35 minutes  Author: Berle Mull, MD [facility] Pager: [phone] 09/07/2017 6:31 PM  If 7PM-7AM, please contact night-coverage at [email or web address], password [facility]

## 2017-09-07 NOTE — Progress Notes (Signed)
Pt c/o of burning, bladder spasms and leakage noted around the catheter. Tylenol given for mild discomfort. Will cont to monitor. SRP, RN

## 2017-09-07 NOTE — Progress Notes (Signed)
PHARMACY - PHYSICIAN COMMUNICATION CRITICAL VALUE ALERT - BLOOD CULTURE IDENTIFICATION (BCID)  Kenneth Williams is an 82 y.o. male who presented to [facility] on 09/06/2017 with a chief complaint of hematuria  Assessment:  Patient admitted with UTI  Urine culture from April 2019 grew pseudomonas so patient was started on piperacillin/tazobactam on admission.  Name of physician (or Provider) Contacted: Dr. Posey Pronto  Current antibiotics: Piperacillin/tazobactam 3.375 g IV q8h EI  Antibiotic plan: Discussed with MD. Will hold off on changing to CTX and continue piperacillin/tazobactam at this time pending final urine culture result.   Results for orders placed or performed during the hospital encounter of 09/06/17  Blood Culture ID Panel (Reflexed) (Collected: 09/06/2017  3:48 PM)  Result Value Ref Range   Enterococcus species NOT DETECTED NOT DETECTED   Listeria monocytogenes NOT DETECTED NOT DETECTED   Staphylococcus species NOT DETECTED NOT DETECTED   Staphylococcus aureus NOT DETECTED NOT DETECTED   Streptococcus species NOT DETECTED NOT DETECTED   Streptococcus agalactiae NOT DETECTED NOT DETECTED   Streptococcus pneumoniae NOT DETECTED NOT DETECTED   Streptococcus pyogenes NOT DETECTED NOT DETECTED   Acinetobacter baumannii NOT DETECTED NOT DETECTED   Enterobacteriaceae species DETECTED (A) NOT DETECTED   Enterobacter cloacae complex NOT DETECTED NOT DETECTED   Escherichia coli DETECTED (A) NOT DETECTED   Klebsiella oxytoca NOT DETECTED NOT DETECTED   Klebsiella pneumoniae NOT DETECTED NOT DETECTED   Proteus species NOT DETECTED NOT DETECTED   Serratia marcescens NOT DETECTED NOT DETECTED   Carbapenem resistance NOT DETECTED NOT DETECTED   Haemophilus influenzae NOT DETECTED NOT DETECTED   Neisseria meningitidis NOT DETECTED NOT DETECTED   Pseudomonas aeruginosa NOT DETECTED NOT DETECTED   Candida albicans NOT DETECTED NOT DETECTED   Candida glabrata NOT DETECTED NOT DETECTED   Candida krusei NOT DETECTED NOT DETECTED   Candida parapsilosis NOT DETECTED NOT DETECTED   Candida tropicalis NOT DETECTED NOT [place], PharmD, BCPS 09/07/2017  9:57 AM

## 2017-09-08 LAB — COMPREHENSIVE METABOLIC PANEL
ALT: 18 U/L (ref 0–44)
AST: 15 U/L (ref 15–41)
Albumin: 2.3 g/dL — ABNORMAL LOW (ref 3.5–5.0)
Alkaline Phosphatase: 77 U/L (ref 38–126)
Anion gap: 12 (ref 5–15)
BILIRUBIN TOTAL: 0.7 mg/dL (ref 0.3–1.2)
BUN: 36 mg/dL — AB (ref 8–23)
CHLORIDE: 110 mmol/L (ref 98–111)
CO2: 17 mmol/L — ABNORMAL LOW (ref 22–32)
Calcium: 8.3 mg/dL — ABNORMAL LOW (ref 8.9–10.3)
Creatinine, Ser: 2.87 mg/dL — ABNORMAL HIGH (ref 0.61–1.24)
GFR calc Af Amer: 22 mL/min — ABNORMAL LOW (ref >60)
GFR calc non Af Amer: 19 mL/min — ABNORMAL LOW (ref >60)
Glucose, Bld: 108 mg/dL — ABNORMAL HIGH (ref 70–99)
POTASSIUM: 4.2 mmol/L (ref 3.5–5.1)
SODIUM: 139 mmol/L (ref 135–145)
Total Protein: 5.7 g/dL — ABNORMAL LOW (ref 6.5–8.1)

## 2017-09-08 LAB — CBC WITH DIFFERENTIAL/PLATELET
BASOS ABS: 0 10*3/uL (ref 0.0–0.1)
Basophils Relative: 0 %
EOS ABS: 0.3 10*3/uL (ref 0.0–0.7)
Eosinophils Relative: 2 %
HEMATOCRIT: 29 % — AB (ref 39.0–52.0)
HEMOGLOBIN: 9.4 g/dL — AB (ref 13.0–17.0)
Lymphocytes Relative: 6 %
Lymphs Abs: 1 10*3/uL (ref 0.7–4.0)
MCH: 28.4 pg (ref 26.0–34.0)
MCHC: 32.4 g/dL (ref 30.0–36.0)
MCV: 87.6 fL (ref 78.0–100.0)
Monocytes Absolute: 1.5 10*3/uL — ABNORMAL HIGH (ref 0.1–1.0)
Monocytes Relative: 9 %
NEUTROS ABS: 13.7 10*3/uL — AB (ref 1.7–7.7)
NEUTROS PCT: 83 %
Platelets: 239 10*3/uL (ref 150–400)
RBC: 3.31 MIL/uL — AB (ref 4.22–5.81)
RDW: 13.5 % (ref 11.5–15.5)
WBC: 16.5 10*3/uL — AB (ref 4.0–10.5)

## 2017-09-08 NOTE — Progress Notes (Signed)
Subjective: Patient reports His catheter is not draining, he has an urge to void and is voiding around the catheter. Of note, the urine culture from clinic grew mixed gram negative growth, but his blood culture in hospital is growing Escherichia coli.  Objective: Vital signs in last 24 hours: Temp:  [98.1 F (36.7 C)-99.4 F (37.4 C)] 99 F (37.2 C) (07/18 1325) Pulse Rate:  [73-129] 78 (07/18 1600) Resp:  [16-18] 18 (07/18 1325) BP: (146-163)/(59-76) 163/76 (07/18 1325) SpO2:  [94 %-97 %] 97 % (07/18 1325)  Intake/Output from previous day: 07/17 0701 - 07/18 0700 In: 1817.9 [P.O.:200; I.V.:1388.3; IV Piggyback:229.6] Out: 1100 [Urine:1100] Intake/Output this shift: Total I/O In: 240 [P.O.:240] Out: -   Physical Exam:  NAD Abdomen-suprapubic area mildly distended GU-mild penile edema but foreskin retracts and appears normal in the glans is normal.  Right testicle with mild swelling and erythema consistent with epididymal orchitis.  Procedure: I took sterile saline flush and tried to irrigate the 16 [place] Foley and it would not irrigate.  It was high pressure.  I then tried to move the balloon around and it was fixed.  Therefore I deflated the balloon and we advanced the catheter, inflated the balloon and pulled it back to the bladder neck.  The catheter seemed to irrigate better and drain so I left it.  Nurse check a bladder scan as patient complained catheter not draining.  Bladder scan was 300 mL's.  I returned and discussed with the patient the nature risks benefits for catheter change and he agreed to proceed.  The balloon was deflated and the 16 [place] catheter removed.  The foreskin was pulled back just enough to see the meatus and the meatus prepped.  I then passed a 40 [place] two-day catheter with no difficulty and the balloon was inflated and seated at the bladder neck.  This drained 500 mL relief of the patient's urgency.  Urine was clear.  Lab Results: Recent Labs     09/06/17 1327 09/07/17 0422 09/08/17 0542  HGB 11.7* 9.5* 9.4*  HCT 36.4* 29.9* 29.0*   BMET Recent Labs    09/07/17 0422 09/08/17 0542  NA 140 139  K 3.8 4.2  CL 109 110  CO2 23 17*  GLUCOSE 122* 108*  BUN 43* 36*  CREATININE 3.04* 2.87*  CALCIUM 8.2* 8.3*   No results for input(s): LABPT, INR in the last 72 hours. No results for input(s): LABURIN in the last 72 hours. Results for orders placed or performed during the hospital encounter of 09/06/17  Blood culture (routine x 2)     Status: Abnormal (Preliminary result)   Collection Time: 09/06/17  3:48 PM  Result Value Ref Range Status   Specimen Description   Final    BLOOD LEFT WRIST Performed at [facility] [address] [place], [place] [address]    Special Requests   Final    BOTTLES DRAWN AEROBIC AND ANAEROBIC Blood Culture results may not be optimal due to an inadequate volume of blood received in culture bottles Performed at [facility] [address], [place], [place] [address]    Culture  Setup Time   Final    GRAM NEGATIVE RODS IN BOTH AEROBIC AND ANAEROBIC BOTTLES CRITICAL RESULT CALLED TO, READ BACK BY AND VERIFIED WITH: PHARMD Melodye Ped [id number] MLM    Culture (A)  Final    ESCHERICHIA COLI SUSCEPTIBILITIES TO FOLLOW Performed at [facility], [place] [address],  [place], [place] [address]    Report Status PENDING  Incomplete  Blood Culture ID Panel (Reflexed)     Status: Abnormal   Collection Time: 09/06/17  3:48 PM  Result Value Ref Range Status   Enterococcus species NOT DETECTED NOT DETECTED Final   Listeria monocytogenes NOT DETECTED NOT DETECTED Final   Staphylococcus species NOT DETECTED NOT DETECTED Final   Staphylococcus aureus NOT DETECTED NOT DETECTED Final   Streptococcus species NOT DETECTED NOT DETECTED Final   Streptococcus agalactiae NOT DETECTED NOT DETECTED Final   Streptococcus pneumoniae NOT DETECTED NOT DETECTED Final    Streptococcus pyogenes NOT DETECTED NOT DETECTED Final   Acinetobacter baumannii NOT DETECTED NOT DETECTED Final   Enterobacteriaceae species DETECTED (A) NOT DETECTED Final    Comment: Enterobacteriaceae represent a large family of gram-negative bacteria, not a single organism. CRITICAL RESULT CALLED TO, READ BACK BY AND VERIFIED WITH: PHARMD J GADHIA [id number] 0921 MLM    Enterobacter cloacae complex NOT DETECTED NOT DETECTED Final   Escherichia coli DETECTED (A) NOT DETECTED Final    Comment: CRITICAL RESULT CALLED TO, READ BACK BY AND VERIFIED WITH: PHARMD J GADHIA [id number] 0921 MLM    Klebsiella oxytoca NOT DETECTED NOT DETECTED Final   Klebsiella pneumoniae NOT DETECTED NOT DETECTED Final   Proteus species NOT DETECTED NOT DETECTED Final   Serratia marcescens NOT DETECTED NOT DETECTED Final   Carbapenem resistance NOT DETECTED NOT DETECTED Final   Haemophilus influenzae NOT DETECTED NOT DETECTED Final   Neisseria meningitidis NOT DETECTED NOT DETECTED Final   Pseudomonas aeruginosa NOT DETECTED NOT DETECTED Final   Candida albicans NOT DETECTED NOT DETECTED Final   Candida glabrata NOT DETECTED NOT DETECTED Final   Candida krusei NOT DETECTED NOT DETECTED Final   Candida parapsilosis NOT DETECTED NOT DETECTED Final   Candida tropicalis NOT DETECTED NOT DETECTED Final    Comment: Performed at [facility], [place] [address], [place], [place] [address]  Blood culture (routine x 2)     Status: None (Preliminary result)   Collection Time: 09/06/17  3:53 PM  Result Value Ref Range Status   Specimen Description   Final    BLOOD RIGHT WRIST Performed at [facility] [address], [place], [place] [address]    Special Requests   Final    BOTTLES DRAWN AEROBIC AND ANAEROBIC Blood Culture results may not be optimal due to an excessive volume of blood received in culture bottles Performed at [facility] [address], [place], [place]  [address]    Culture   Final    NO GROWTH 2 DAYS Performed at [facility] [address], [place], [place] [address]    Report Status PENDING  Incomplete  Urine culture     Status: None (Preliminary result)   Collection Time: 09/06/17  7:24 PM  Result Value Ref Range Status   Specimen Description   Final    URINE, CLEAN CATCH Performed at [facility], [place] [address], [place], [place] [address]    Special Requests   Final    Normal Performed at [facility], [place] [address] [place], [place] [address]    Culture   Final    CULTURE REINCUBATED FOR BETTER GROWTH Performed at [facility], [place] [address], [place], [place] [address]    Report Status PENDING  Incomplete    Studies/Results: No results found.  Assessment/Plan:  Bph, retention - Continue Foley catheter,  he will follow with Dr. Dorina Hoyer Aug 12, 11:15 AM.  E coli bacteremia - Sensitivities pending pain treatment for the bacteremia should certainly cover his cystitis and orchitis.   LOS: 2 days   Festus Aloe 09/08/2017, 5:58 PM

## 2017-09-08 NOTE — Progress Notes (Addendum)
[facility] Progress Note  Patient: Kenneth Williams JSE:[id number]   PCP: Cassandria Anger, MD DOB: 12-23-1935   DOA: 09/06/2017   DOS: 09/08/2017   Date of Service: the patient was seen and examined on 09/08/2017  Subjective: Overnight significant leakage from the catheter as well as bladder spasm.  No nausea no vomiting.  Minimal oral intake.  Brief hospital course: Pt. with PMH of BPH, hypertension, TIA, CKD stage III; admitted on 09/06/2017, presented with complaint of hematuria, was found to have sepsis secondary to E. coli UTI. Currently further plan is for headaches.  Assessment and Plan: 1.  Sepsis secondary to E. coli bacteremia and UTI. Hematuria. Indwelling Foley catheter. Chronic urinary retention. Urology consulted. Urine culture performed in the clinic. Blood culture performed here is positive for E. coli. Discussed with urology, patient has history of Pseudomonas as well as emphysematous cystitis and therefore they want to continue broad-spectrum antibiotics. Follow-up on culture. Repeat cultures performed. Requested urology to evaluate the patient today due to leakage from the catheter.  Acute kidney injury on CKD stage III -IV fluids on hold -Repeat BMP in AM  Hypothyroidism -Continue Synthroid  BPH Contributing to current presentation -Continue finasteride and alfuzosin  Hypertension Uncontrolled. -Continue home amlodipine and labetalol -Hydralazine prn  History of TIA -Continue Pravastatin  Diet: car modified diet DVT Prophylaxis: mechanical compression device  Advance goals of care discussion: DNR DNI  Family Communication: no family was present at bedside, at the time of interview.   Disposition:  Discharge to home.  Consultants: urology Procedures: none  Antibiotics: Anti-infectives (From admission, onward)   Start     Dose/Rate Route Frequency Ordered Stop   09/07/17 0000  piperacillin-tazobactam (ZOSYN) IVPB 3.375 g     3.375 g 12.5 mL/hr over 240 Minutes Intravenous Every 8 hours 09/06/17 2259     09/06/17 1600  ceFEPIme (MAXIPIME) 2 g in sodium chloride 0.9 % 100 mL IVPB  Status:  Discontinued     2 g 200 mL/hr over 30 Minutes Intravenous  Once 09/06/17 1528 09/06/17 1539   09/06/17 1545  piperacillin-tazobactam (ZOSYN) IVPB 3.375 g     3.375 g 100 mL/hr over 30 Minutes Intravenous  Once 09/06/17 1543 09/06/17 1644       Objective: Physical Exam: Vitals:   09/07/17 2337 09/08/17 0457 09/08/17 1325 09/08/17 1600  BP:  (!) 151/68 (!) 163/76   Pulse:  74 (!) 129 78  Resp:  16 18   Temp: 98.1 F (36.7 C) 99.4 F (37.4 C) 99 F (37.2 C)   TempSrc: Oral Oral Oral   SpO2:  94% 97%   Weight:      Height:        Intake/Output Summary (Last 24 hours) at 09/08/2017 1931 Last data filed at 09/08/2017 1008 Gross per 24 hour  Intake 1670.84 ml  Output 700 ml  Net 970.84 ml   Filed Weights   09/06/17 1252 09/06/17 2038  Weight: 83.9 kg (185 lb) 86.1 kg (189 lb 12.8 oz)   General: Alert, Awake and Oriented to Time, Place and Person. Appear in mild distress, affect appropriate Eyes: PERRL, Conjunctiva normal ENT: Oral Mucosa clear moist. Neck: no JVD, no Abnormal Mass Or lumps Cardiovascular: S1 and S2 Present, no Murmur, Peripheral Pulses Present Respiratory: normal respiratory effort, Bilateral Air entry equal and Decreased, no use of accessory muscle, Clear to Auscultation, no Crackles, no wheezes Abdomen: Bowel Sound present, Soft and no tenderness, no hernia Skin: no redness, no Rash, no  induration Extremities: no Pedal edema, no calf tenderness Neurologic: Grossly no focal neuro deficit. Bilaterally Equal motor strength  Data Reviewed: CBC: Recent Labs  Lab 09/06/17 1327 09/07/17 0422 09/08/17 0542  WBC 15.2* 16.6* 16.5*  NEUTROABS 13.2*  --  13.7*  HGB 11.7* 9.5* 9.4*  HCT 36.4* 29.9* 29.0*  MCV 90.8 88.2 87.6  PLT 196 238 163   Basic Metabolic Panel: Recent Labs  Lab  09/06/17 1327 09/07/17 0422 09/08/17 0542  NA 138 140 139  K 4.9 3.8 4.2  CL 109 109 110  CO2 20* 23 17*  GLUCOSE 115* 122* 108*  BUN 41* 43* 36*  CREATININE 3.01* 3.04* 2.87*  CALCIUM 8.6* 8.2* 8.3*    Liver Function Tests: Recent Labs  Lab 09/06/17 1327 09/07/17 0422 09/08/17 0542  AST 17 15 15   ALT 12 15 18   ALKPHOS 77 76 77  BILITOT 1.0 0.6 0.7  PROT 6.5 5.5* 5.7*  ALBUMIN 2.9* 2.3* 2.3*   No results for input(s): LIPASE, AMYLASE in the last 168 hours. No results for input(s): AMMONIA in the last 168 hours. Coagulation Profile: No results for input(s): INR, PROTIME in the last 168 hours. Cardiac Enzymes: No results for input(s): CKTOTAL, CKMB, CKMBINDEX, TROPONINI in the last 168 hours. BNP (last 3 results) No results for input(s): PROBNP in the last 8760 hours. CBG: No results for input(s): GLUCAP in the last 168 hours. Studies: No results found.  Scheduled Meds: . alfuzosin  10 mg Oral Q breakfast  . amLODipine  2.5 mg Oral Daily  . cholecalciferol  2,000 Units Oral Daily  . finasteride  5 mg Oral Daily  . labetalol  300 mg Oral BID  . levothyroxine  88 mcg Oral QAC breakfast  . pravastatin  20 mg Oral Daily   Continuous Infusions: . piperacillin-tazobactam (ZOSYN)  IV 3.375 g (09/08/17 1603)   PRN Meds: acetaminophen **OR** acetaminophen, hydrALAZINE, oxybutynin  Time spent: 35 minutes  Author: Berle Mull, MD [facility] Pager: [phone] 09/08/2017 7:31 PM  If 7PM-7AM, please contact night-coverage at [email or web address], password [facility]

## 2017-09-08 NOTE — Plan of Care (Signed)
VSS, patient has noted much relief since Dr. Junious Silk replaced foley, no more pressure or sensation of bladder cramps.  Daughter at bedside for part of shift, patient up and down to bathroom with standby assist only.  Medicated for pain and bladder spasm x 1 this shift with improvement.

## 2017-09-08 NOTE — Progress Notes (Signed)
Bladder scan reads >300 mls after irrigation by urology.  Will notify urology on call.

## 2017-09-08 NOTE — Progress Notes (Signed)
Patient continues to have bladder spasms and leakage around the foley catheter. Will continue to monitor the patient.

## 2017-09-09 DIAGNOSIS — N12 Tubulo-interstitial nephritis, not specified as acute or chronic: Secondary | ICD-10-CM

## 2017-09-09 DIAGNOSIS — N3081 Other cystitis with hematuria: Secondary | ICD-10-CM

## 2017-09-09 DIAGNOSIS — N189 Chronic kidney disease, unspecified: Secondary | ICD-10-CM

## 2017-09-09 DIAGNOSIS — N452 Orchitis: Secondary | ICD-10-CM

## 2017-09-09 DIAGNOSIS — N179 Acute kidney failure, unspecified: Secondary | ICD-10-CM

## 2017-09-09 DIAGNOSIS — Z87440 Personal history of urinary (tract) infections: Secondary | ICD-10-CM

## 2017-09-09 DIAGNOSIS — B952 Enterococcus as the cause of diseases classified elsewhere: Secondary | ICD-10-CM

## 2017-09-09 DIAGNOSIS — B9620 Unspecified Escherichia coli [E. coli] as the cause of diseases classified elsewhere: Secondary | ICD-10-CM

## 2017-09-09 DIAGNOSIS — R339 Retention of urine, unspecified: Secondary | ICD-10-CM

## 2017-09-09 DIAGNOSIS — R7881 Bacteremia: Secondary | ICD-10-CM

## 2017-09-09 LAB — CULTURE, BLOOD (ROUTINE X 2)

## 2017-09-09 LAB — CBC WITH DIFFERENTIAL/PLATELET
BASOS ABS: 0 10*3/uL (ref 0.0–0.1)
BASOS PCT: 0 %
EOS ABS: 0.6 10*3/uL (ref 0.0–0.7)
Eosinophils Relative: 4 %
HEMATOCRIT: 29.8 % — AB (ref 39.0–52.0)
Hemoglobin: 9.5 g/dL — ABNORMAL LOW (ref 13.0–17.0)
Lymphocytes Relative: 12 %
Lymphs Abs: 1.9 10*3/uL (ref 0.7–4.0)
MCH: 28.1 pg (ref 26.0–34.0)
MCHC: 31.9 g/dL (ref 30.0–36.0)
MCV: 88.2 fL (ref 78.0–100.0)
Monocytes Absolute: 0.9 10*3/uL (ref 0.1–1.0)
Monocytes Relative: 6 %
NEUTROS ABS: 12.2 10*3/uL — AB (ref 1.7–7.7)
NEUTROS PCT: 78 %
Platelets: 294 10*3/uL (ref 150–400)
RBC: 3.38 MIL/uL — AB (ref 4.22–5.81)
RDW: 13.7 % (ref 11.5–15.5)
WBC: 15.6 10*3/uL — AB (ref 4.0–10.5)

## 2017-09-09 LAB — RENAL FUNCTION PANEL
ANION GAP: 9 (ref 5–15)
Albumin: 2.2 g/dL — ABNORMAL LOW (ref 3.5–5.0)
BUN: 30 mg/dL — ABNORMAL HIGH (ref 8–23)
CALCIUM: 8.3 mg/dL — AB (ref 8.9–10.3)
CO2: 20 mmol/L — ABNORMAL LOW (ref 22–32)
CREATININE: 2.74 mg/dL — AB (ref 0.61–1.24)
Chloride: 108 mmol/L (ref 98–111)
GFR calc non Af Amer: 20 mL/min — ABNORMAL LOW (ref >60)
GFR, EST AFRICAN AMERICAN: 24 mL/min — AB (ref >60)
Glucose, Bld: 112 mg/dL — ABNORMAL HIGH (ref 70–99)
Phosphorus: 3.6 mg/dL (ref 2.5–4.6)
Potassium: 3.8 mmol/L (ref 3.5–5.1)
Sodium: 137 mmol/L (ref 135–145)

## 2017-09-09 MED ORDER — SODIUM CHLORIDE 0.9 % IV SOLN
2.0000 g | INTRAVENOUS | Status: DC
  Filled 2017-09-09: qty 20

## 2017-09-09 MED ORDER — POLYETHYLENE GLYCOL 3350 17 G PO PACK
17.0000 g | PACK | Freq: Every day | ORAL | Status: DC
  Administered 2017-09-09 – 2017-09-20 (×3): 17 g via ORAL
  Filled 2017-09-09 (×8): qty 1

## 2017-09-09 MED ORDER — PIPERACILLIN-TAZOBACTAM 3.375 G IVPB
3.3750 g | Freq: Three times a day (TID) | INTRAVENOUS | Status: DC
  Administered 2017-09-09 – 2017-09-20 (×32): 3.375 g via INTRAVENOUS
  Filled 2017-09-09 (×33): qty 50

## 2017-09-09 MED ORDER — DOCUSATE SODIUM 100 MG PO CAPS
100.0000 mg | ORAL_CAPSULE | Freq: Two times a day (BID) | ORAL | Status: DC
  Filled 2017-09-09: qty 1

## 2017-09-09 MED ORDER — TRAMADOL HCL 50 MG PO TABS: 50.0000 mg | ORAL_TABLET | Freq: Two times a day (BID) | ORAL | Status: DC | PRN

## 2017-09-09 MED ORDER — HEPARIN SODIUM (PORCINE) 5000 UNIT/ML IJ SOLN
5000.0000 [IU] | Freq: Three times a day (TID) | INTRAMUSCULAR | Status: DC
  Administered 2017-09-09 – 2017-09-13 (×14): 5000 [IU] via SUBCUTANEOUS
  Filled 2017-09-09 (×14): qty 1

## 2017-09-09 MED ORDER — SENNOSIDES-DOCUSATE SODIUM 8.6-50 MG PO TABS
1.0000 | ORAL_TABLET | Freq: Two times a day (BID) | ORAL | Status: DC
  Administered 2017-09-09 – 2017-09-20 (×10): 1 via ORAL
  Filled 2017-09-09 (×20): qty 1

## 2017-09-09 NOTE — Care Management Important Message (Signed)
Important Message  Patient Details  Name: Kenneth Williams MRN: [id number] Date of Birth: Dec 04, 1935   Medicare Important Message Given:  Yes    [name] 09/09/2017, 11:52 AMImportant Message  Patient Details  Name: Kenneth Williams MRN: [id number] Date of Birth: October 10, 1935   Medicare Important Message Given:  Yes    [name] 09/09/2017, 11:52 AM

## 2017-09-09 NOTE — Evaluation (Signed)
Physical Therapy Evaluation Patient Details Name: Kenneth Williams MRN: [id number] DOB: [birth year] Today's Date: 09/09/2017   History of Present Illness  PMH of BPH, hypertension, TIA, CKD stage III; admitted on 09/06/2017, presented with complaint of hematuria, was found to have sepsis secondary to E. coli UTI.  Clinical Impression  Pt admitted with above diagnosis. Pt currently with functional limitations due to the deficits listed below (see PT Problem List). * Pt with mildly unsteady gait today however no overt LOB, he is reluctant to move at all and pt dtr states he doesn't move around as much as he should at baseline; will follow, may need HHPT depending on progress --continue to assess  Pt will benefit from skilled PT to increase their independence and safety with mobility to allow discharge to the venue listed below.        Follow Up Recommendations No PT follow up(vs HHPT)    Equipment Recommendations  None recommended by PT    Recommendations for Other Services       Precautions / Restrictions Precautions Precautions: Fall Restrictions Weight Bearing Restrictions: No      Mobility  Bed Mobility Overal bed mobility: Needs Assistance Bed Mobility: Supine to Sit     Supine to sit: Supervision     General bed mobility comments: for safety and  line management, incr time, no physical assist  Transfers Overall transfer level: Needs assistance Equipment used: None Transfers: Sit to/from Stand Sit to Stand: Min guard         General transfer comment: initially braces LEs on bed, wide BOS  Ambulation/Gait Ambulation/Gait assistance: Min guard Gait Distance (Feet): 120 Feet Assistive device: IV Pole Gait Pattern/deviations: Step-through pattern;Wide base of support;Drifts right/left;Trunk flexed     General Gait Details: min/guard for safety, cues for posture; unsteady gait no overt LOB  Stairs            Wheelchair Mobility    Modified Rankin  (Stroke Patients Only)       Balance Overall balance assessment: Needs assistance   Sitting balance-Leahy Scale: Fair     Standing balance support: During functional activity;No upper extremity supported Standing balance-Leahy Scale: Fair                               Pertinent Vitals/Pain Pain Assessment: No/denies pain    Home Living Family/patient expects to be discharged to:: Private residence     Type of Home: House       Home Layout: One level Home Equipment: None      Prior Function Level of Independence: Independent               Hand Dominance        Extremity/Trunk Assessment   Upper Extremity Assessment Upper Extremity Assessment: Overall WFL for tasks assessed    Lower Extremity Assessment Lower Extremity Assessment: Overall WFL for tasks assessed    Cervical / Trunk Assessment Cervical / Trunk Assessment: Kyphotic  Communication   Communication: No difficulties  Cognition Arousal/Alertness: Awake/alert Behavior During Therapy: WFL for tasks assessed/performed Overall Cognitive Status: Within Functional Limits for tasks assessed                                 General Comments: appears [facility]      General Comments      Exercises     Assessment/Plan  PT Assessment Patient needs continued PT services  PT Problem List Decreased mobility;Decreased balance;Decreased activity tolerance       PT Treatment Interventions DME instruction;Gait training;Therapeutic exercise;Patient/family education;Functional mobility training;Therapeutic activities;Balance training    PT Goals (Current goals can be found in the Care Plan section)  Acute Rehab PT Goals Patient Stated Goal: home soon PT Goal Formulation: With patient Time For Goal Achievement: 09/16/17 Potential to Achieve Goals: Good    Frequency Min 3X/week   Barriers to discharge        Co-evaluation               AM-PAC PT "6 Clicks"  Daily Activity  Outcome Measure Difficulty turning over in bed (including adjusting bedclothes, sheets and blankets)?: A Little Difficulty moving from lying on back to sitting on the side of the bed? : A Little Difficulty sitting down on and standing up from a chair with arms (e.g., wheelchair, bedside commode, etc,.)?: Unable Help needed moving to and from a bed to chair (including a wheelchair)?: A Little Help needed walking in hospital room?: A Little Help needed climbing 3-5 steps with a railing? : A Lot 6 Click Score: 15    End of Session Equipment Utilized During Treatment: Gait belt Activity Tolerance: Patient tolerated treatment well Patient left: with call bell/phone within reach;with chair alarm set;in chair;with family/visitor present   PT Visit Diagnosis: Difficulty in walking, not elsewhere classified (R26.2)    Time: 1435-1501 PT Time Calculation (min) (ACUTE ONLY): 26 min   Charges:   PT Evaluation $PT Eval Low Complexity: 1 Low PT Treatments $Gait Training: 8-22 mins   PT G CodesKenyon Ana, PT Pager: [phone] 09/09/2017   [facility] 09/09/2017, 3:29 PM

## 2017-09-09 NOTE — Progress Notes (Signed)
Pharmacy Antibiotic Note  Kenneth Williams is a 82 y.o. male admitted on 09/06/2017 with UTI and E.coli bacteremia. Patient has Hx pseudomonal UTI. 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  Pharmacy has been consulted for piperacillin/tazobactam dosing.  Today, 09/09/17  Today is day #3 of IV antibiotics  WBC 16.5 on 7/18  Afebrile  Awaiting final results of urine culture obtained in urology office PTA as well as inpatient.   Plan:  Per discussion with MD, continue piperacillin/tazobactam pending urine culture results  Continue piperacillin/tazobactam 3.375 g IV q8h EI  Follow SCr closely for any renal adjustments needed  Follow cultures  Height: 6\' 2"  (188 cm) Weight: 189 lb 12.8 oz (86.1 kg) IBW/kg (Calculated) : 82.2  Temp (24hrs), Avg:98.8 F (37.1 C), Min:98.3 F (36.8 C), Max:99 F (37.2 C)  Recent Labs  Lab 09/06/17 1327 09/06/17 1334 09/07/17 0422 09/08/17 0542  WBC 15.2*  --  16.6* 16.5*  CREATININE 3.01*  --  3.04* 2.87*  LATICACIDVEN  --  1.35  --   --     Estimated Creatinine Clearance: 23.9 mL/min (A) (by C-G formula based on SCr of 2.87 mg/dL (H)).    Allergies  Allergen Reactions  . Lipitor [Atorvastatin]     diarrhea  . Spironolactone     gynecomastia  . Zocor [Simvastatin]     achy  . Coreg [Carvedilol] Diarrhea    Diarrhea and bradycardia  . Donepezil Hydrochloride Diarrhea    REACTION: diarrhea  . Hydrochlorothiazide Other (See Comments)    REACTION: Gout  . Razadyne [Galantamine Hydrobromide] Diarrhea    diarrhea    Dose adjustments this admission: n/a  Microbiology results: 7/16 BCx: E. Coli, R to ampicillin. Sensitive to piperacillin/tazobactam 7/18 Repeat BCx: No growth 1 day 7/16 UCx: 60K GNR, unidentified organism  Thank you for allowing pharmacy to be a part of this patient's care.  Theodis Shove, PharmD, BCPS Clinical Pharmacist Pager:  [phone] 09/09/17 9:01 AM

## 2017-09-09 NOTE — Progress Notes (Signed)
[facility] Progress Note  Patient: Kenneth Williams UKG:[id number]   PCP: Cassandria Anger, MD DOB: [birth year]   DOA: 09/06/2017   DOS: 09/09/2017   Date of Service: the patient was seen and examined on 09/09/2017  Subjective: Catheter leakage is improved.  No nausea no vomiting.  Also bladder spasm improved.  Still has constipation and minimal oral intake. No fever no chills. . Brief hospital course: Pt. with PMH of BPH, hypertension, TIA, CKD stage III; admitted on 09/06/2017, presented with complaint of hematuria, was found to have sepsis secondary to E. coli UTI. Currently further plan is for continue IV antibiotics.  Assessment and Plan: 1.  Sepsis secondary to E. coli bacteremia and E. coli and enterococcus UTI. Hematuria. Indwelling Foley catheter. Chronic urinary retention. Urology consulted. Urine culture performed in the clinic. Blood culture performed here is positive for E. coli. Discussed with urology, patient has history of Pseudomonas as well as emphysematous cystitis and therefore they want to continue broad-spectrum antibiotics. Urine culture performed in the clinic showed E. coli as well as another unidentified organism. Urine culture performed here in the hospital is showing E. coli and enterococcus-sensitivities are currently pending. Blood cultures growing E. coli only mostly sensitive to all antibiotics. Currently I will continue IV Zosyn since the urine culture is also growing enterococcus until sensitivities are back Repeat cultures performed, so far negative. Patient had catheter leakage and bladder spasm, catheter was changed on 09/08/2017 again, following which symptoms are resolved. Highly appreciate all the urologist involved in patient's care.  Acute kidney injury on CKD stage III Function getting better.  Continue close monitoring encourage p.o. Fluids. Renal function getting better after replacement of the Foley  catheter.  Hypothyroidism -Continue Synthroid  BPH Contributing to current presentation -Continue finasteride and alfuzosin  Hypertension Uncontrolled. -Continue home amlodipine and labetalol -Hydralazine prn  History of dyslipidemia -Continue Pravastatin  Constipation and poor p.o. intake. Continue stool softeners.  Diet: car modified diet DVT Prophylaxis: Heparin  Advance goals of care discussion: DNR DNI  Family Communication: no family was present at bedside, at the time of interview.   Disposition:  Discharge to home,  PT consulted  Consultants: urology Procedures: none  Antibiotics: Anti-infectives (From admission, onward)   Start     Dose/Rate Route Frequency Ordered Stop   09/09/17 1300  cefTRIAXone (ROCEPHIN) 2 g in sodium chloride 0.9 % 100 mL IVPB  Status:  Discontinued     2 g 200 mL/hr over 30 Minutes Intravenous Every 24 hours 09/09/17 1146 09/09/17 1314   09/07/17 0000  piperacillin-tazobactam (ZOSYN) IVPB 3.375 g  Status:  Discontinued     3.375 g 12.5 mL/hr over 240 Minutes Intravenous Every 8 hours 09/06/17 2259 09/09/17 1146   09/06/17 1600  ceFEPIme (MAXIPIME) 2 g in sodium chloride 0.9 % 100 mL IVPB  Status:  Discontinued     2 g 200 mL/hr over 30 Minutes Intravenous  Once 09/06/17 1528 09/06/17 1539   09/06/17 1545  piperacillin-tazobactam (ZOSYN) IVPB 3.375 g     3.375 g 100 mL/hr over 30 Minutes Intravenous  Once 09/06/17 1543 09/06/17 1644       Objective: Physical Exam: Vitals:   09/08/17 1600 09/08/17 2051 09/09/17 0544 09/09/17 1303  BP:  (!) 155/70 (!) 154/86 (!) 160/74  Pulse: 78 68 64 65  Resp:  20 20 20   Temp:  99 F (37.2 C) 98.3 F (36.8 C) 99.3 F (37.4 C)  TempSrc:  Oral Oral Oral  SpO2:  94% 98% 93%  Weight:      Height:        Intake/Output Summary (Last 24 hours) at 09/09/2017 1314 Last data filed at 09/09/2017 0600 Gross per 24 hour  Intake 452.93 ml  Output 1525 ml  Net -1072.07 ml   Filed Weights    09/06/17 1252 09/06/17 2038  Weight: 83.9 kg (185 lb) 86.1 kg (189 lb 12.8 oz)   General: Alert, Awake and Oriented to Time, Place and Person. Appear in mild distress, affect appropriate Eyes: PERRL, Conjunctiva normal ENT: Oral Mucosa clear moist. Neck: no JVD, no Abnormal Mass Or lumps Cardiovascular: S1 and S2 Present, no Murmur, Peripheral Pulses Present Respiratory: normal respiratory effort, Bilateral Air entry equal and Decreased, no use of accessory muscle, Clear to Auscultation, no Crackles, no wheezes Abdomen: Bowel Sound present, Soft and no tenderness, no hernia Skin: no redness, no Rash, no induration Extremities: no Pedal edema, no calf tenderness Neurologic: Grossly no focal neuro deficit. Bilaterally Equal motor strength  Data Reviewed: CBC: Recent Labs  Lab 09/06/17 1327 09/07/17 0422 09/08/17 0542 09/09/17 0831  WBC 15.2* 16.6* 16.5* 15.6*  NEUTROABS 13.2*  --  13.7* 12.2*  HGB 11.7* 9.5* 9.4* 9.5*  HCT 36.4* 29.9* 29.0* 29.8*  MCV 90.8 88.2 87.6 88.2  PLT 196 238 239 235   Basic Metabolic Panel: Recent Labs  Lab 09/06/17 1327 09/07/17 0422 09/08/17 0542 09/09/17 0831  NA 138 140 139 137  K 4.9 3.8 4.2 3.8  CL 109 109 110 108  CO2 20* 23 17* 20*  GLUCOSE 115* 122* 108* 112*  BUN 41* 43* 36* 30*  CREATININE 3.01* 3.04* 2.87* 2.74*  CALCIUM 8.6* 8.2* 8.3* 8.3*  PHOS  --   --   --  3.6    Liver Function Tests: Recent Labs  Lab 09/06/17 1327 09/07/17 0422 09/08/17 0542 09/09/17 0831  AST 17 15 15   --   ALT 12 15 18   --   ALKPHOS 77 76 77  --   BILITOT 1.0 0.6 0.7  --   PROT 6.5 5.5* 5.7*  --   ALBUMIN 2.9* 2.3* 2.3* 2.2*   No results for input(s): LIPASE, AMYLASE in the last 168 hours. No results for input(s): AMMONIA in the last 168 hours. Coagulation Profile: No results for input(s): INR, PROTIME in the last 168 hours. Cardiac Enzymes: No results for input(s): CKTOTAL, CKMB, CKMBINDEX, TROPONINI in the last 168 hours. BNP (last 3  results) No results for input(s): PROBNP in the last 8760 hours. CBG: No results for input(s): GLUCAP in the last 168 hours. Studies: No results found.  Scheduled Meds: . alfuzosin  10 mg Oral Q breakfast  . amLODipine  2.5 mg Oral Daily  . cholecalciferol  2,000 Units Oral Daily  . finasteride  5 mg Oral Daily  . heparin injection (subcutaneous)  5,000 Units Subcutaneous Q8H  . labetalol  300 mg Oral BID  . levothyroxine  88 mcg Oral QAC breakfast  . polyethylene glycol  17 g Oral Daily  . pravastatin  20 mg Oral Daily  . senna-docusate  1 tablet Oral BID   Continuous Infusions:  PRN Meds: acetaminophen **OR** acetaminophen, hydrALAZINE, oxybutynin, traMADol  Time spent: 35 minutes  Author: Berle Mull, MD [facility] Pager: [phone] 09/09/2017 1:14 PM  If 7PM-7AM, please contact night-coverage at [email or web address], password [email or web address]

## 2017-09-09 NOTE — Consult Note (Signed)
[facility]    Date of Admission:  09/06/2017   Total days of antibiotics: 4 zosyn               Reason for Consult: E coli bacteremia    Referring Provider: D[place]sted   Assessment: E coli bacteremia UTI Emphysematous pyelonephritis Prev pseudomonas UTI (S- zosyn) AKI on CKD  Plan: 1. Continue zosyn 2. Await sensi of Enterococcus 3. Await repeat BCx 7-17 4.  Cr improved to near baseline.  5. Defer to Uro on need for surgery with emphysematous findings.  6. Will f/u satuday  Thank you so much for this interesting consult,  Active Problems:   Hypothyroidism   TIA (transient ischemic attack)   Carotid artery stenosis   BPH (benign prostatic hyperplasia)   UTI (urinary tract infection)   . alfuzosin  10 mg Oral Q breakfast  . amLODipine  2.5 mg Oral Daily  . cholecalciferol  2,000 Units Oral Daily  . finasteride  5 mg Oral Daily  . heparin injection (subcutaneous)  5,000 Units Subcutaneous Q8H  . labetalol  300 mg Oral BID  . levothyroxine  88 mcg Oral QAC breakfast  . polyethylene glycol  17 g Oral Daily  . pravastatin  20 mg Oral Daily  . senna-docusate  1 tablet Oral BID    HPI: Kenneth Williams is a 82 y.o. male with hx of chronic urinary retention, repeated UTIs (last pseudomonas 06-2017), seen in Urology office on 7-16. He had f/c prior to visit. He was noted to have gross hematuria when foley placed so was sent for CT scan. This demonstrated emphysematous cystitis and r epdidymal orchitis.  He was admitted and found to have WBC 15.2, Cr 3.01 (prev 2.14) and was afebrile. His temp did rise to 100.8 in hospital.  He was started on zosyn.  His BCx 1/2 from adm have since grown E coli (R-amp, I-unsayn). His UCx show E coli and Enterococcus.     Review of Systems: Review of Systems  Constitutional: Positive for chills and fever.  Gastrointestinal: Positive for abdominal pain. Negative for constipation and diarrhea.    Genitourinary: Positive for dysuria.  Please see HPI. All other systems reviewed and negative.   Past Medical History:  Diagnosis Date  . BPH (benign prostatic hyperplasia)   . CAD (coronary artery disease)    cath 2011-mild non obstructive  . Elevated PSA    history of   . Gout    2009  . History of diverticulitis of colon   . History of pancreatitis   . History of TIAs   . Hyperlipidemia   . Hypertension   . Hypothyroidism   . Hypothyroidism   . Left ear hearing loss    wears right hearing aid  . Personal history of hyperthyroidism 2006   s/p 131I- Dr. Chalmers Cater, Hyperthyroid  . Renal insufficiency 2011  . TGA (transient global amnesia)     Social History   Tobacco Use  . Smoking status: Never Smoker  . Smokeless tobacco: Never Used  Substance Use Topics  . Alcohol use: No  . Drug use: No    Family History  Problem Relation Age of Onset  . Heart disease Sister   . Dementia Mother   . Hypertension Other   . Colon cancer Neg Hx   . Stomach cancer Neg Hx   . Esophageal cancer Neg Hx   . Rectal cancer Neg Hx  Medications:  Scheduled: . alfuzosin  10 mg Oral Q breakfast  . amLODipine  2.5 mg Oral Daily  . cholecalciferol  2,000 Units Oral Daily  . finasteride  5 mg Oral Daily  . heparin injection (subcutaneous)  5,000 Units Subcutaneous Q8H  . labetalol  300 mg Oral BID  . levothyroxine  88 mcg Oral QAC breakfast  . polyethylene glycol  17 g Oral Daily  . pravastatin  20 mg Oral Daily  . senna-docusate  1 tablet Oral BID    Abtx:  Anti-infectives (From admission, onward)   Start     Dose/Rate Route Frequency Ordered Stop   09/09/17 1800  piperacillin-tazobactam (ZOSYN) IVPB 3.375 g     3.375 g 12.5 mL/hr over 240 Minutes Intravenous Every 8 hours 09/09/17 1331     09/09/17 1300  cefTRIAXone (ROCEPHIN) 2 g in sodium chloride 0.9 % 100 mL IVPB  Status:  Discontinued     2 g 200 mL/hr over 30 Minutes Intravenous Every 24 hours 09/09/17 1146 09/09/17  1314   09/07/17 0000  piperacillin-tazobactam (ZOSYN) IVPB 3.375 g  Status:  Discontinued     3.375 g 12.5 mL/hr over 240 Minutes Intravenous Every 8 hours 09/06/17 2259 09/09/17 1146   09/06/17 1600  ceFEPIme (MAXIPIME) 2 g in sodium chloride 0.9 % 100 mL IVPB  Status:  Discontinued     2 g 200 mL/hr over 30 Minutes Intravenous  Once 09/06/17 1528 09/06/17 1539   09/06/17 1545  piperacillin-tazobactam (ZOSYN) IVPB 3.375 g     3.375 g 100 mL/hr over 30 Minutes Intravenous  Once 09/06/17 1543 09/06/17 1644        OBJECTIVE: Blood pressure (!) 160/74, pulse 65, temperature 99.3 F (37.4 C), temperature source Oral, resp. rate 20, height _0  (1.88 m), weight 86.1 kg (189 lb 12.8 oz), SpO2 93 %.  Physical Exam  Constitutional: He appears well-developed and well-nourished.  HENT:  Mouth/Throat: No oropharyngeal exudate.  Eyes: Pupils are equal, round, and reactive to light. EOM are normal.  Neck: Normal range of motion. Neck supple.  Cardiovascular: Normal rate, regular rhythm and normal heart sounds.  Pulmonary/Chest: Effort normal and breath sounds normal.  Abdominal: Soft. Bowel sounds are normal. He exhibits distension. There is no tenderness.  Genitourinary: Right testis shows swelling. Left testis shows swelling.     Musculoskeletal: He exhibits no edema.  Lymphadenopathy:    He has no cervical adenopathy.    Lab Results Results for orders placed or performed during the hospital encounter of 09/06/17 (from the past 48 hour(s))  Culture, blood (routine x 2)     Status: None (Preliminary result)   Collection Time: 09/08/17  5:42 AM  Result Value Ref Range   Specimen Description      BLOOD RIGHT ANTECUBITAL Performed at [facility] [address], [place], [place] [address]    Special Requests      BOTTLES DRAWN AEROBIC AND ANAEROBIC Blood Culture adequate volume Performed at [facility] [address] [place], [place]  [address]    Culture      NO GROWTH 1 DAY Performed at [facility] [address] [place], [place] [address]    Report Status PENDING   Culture, blood (routine x 2)     Status: None (Preliminary result)   Collection Time: 09/08/17  5:42 AM  Result Value Ref Range   Specimen Description      BLOOD RIGHT HAND Performed at [facility], [place] [address] [place], [place] [address]    Special Requests      BOTTLES DRAWN AEROBIC AND ANAEROBIC Blood Culture adequate volume Performed at [facility] [address] [place], [place] [address]    Culture      NO GROWTH 1 DAY Performed at [facility] [address], [place], [place] [address]    Report Status PENDING   CBC with Differential/Platelet     Status: Abnormal   Collection Time: 09/08/17  5:42 AM  Result Value Ref Range   WBC 16.5 (H) 4.0 - 10.5 K/uL   RBC 3.31 (L) 4.22 - 5.81 MIL/uL   Hemoglobin 9.4 (L) 13.0 - 17.0 g/dL   HCT 29.0 (L) 39.0 - 52.0 %   MCV 87.6 78.0 - 100.0 fL   MCH 28.4 26.0 - 34.0 pg   MCHC 32.4 30.0 - 36.0 g/dL   RDW 13.5 11.5 - 15.5 %   Platelets 239 150 - 400 K/uL   Neutrophils Relative % 83 %   Neutro Abs 13.7 (H) 1.7 - 7.7 K/uL   Lymphocytes Relative 6 %   Lymphs Abs 1.0 0.7 - 4.0 K/uL   Monocytes Relative 9 %   Monocytes Absolute 1.5 (H) 0.1 - 1.0 K/uL   Eosinophils Relative 2 %   Eosinophils Absolute 0.3 0.0 - 0.7 K/uL   Basophils Relative 0 %   Basophils Absolute 0.0 0.0 - 0.1 K/uL    Comment: Performed at [facility], [place] [address] [place], [place] [address]  Comprehensive metabolic panel     Status: Abnormal   Collection Time: 09/08/17  5:42 AM  Result Value Ref Range   Sodium 139 135 - 145 mmol/L   Potassium 4.2 3.5 - 5.1 mmol/L   Chloride 110 98 - 111 mmol/L    Comment: Please note change in reference range.   CO2 17 (L) 22 - 32 mmol/L   Glucose, Bld 108 (H) 70 - 99 mg/dL    Comment: Please note change in  reference range.   BUN 36 (H) 8 - 23 mg/dL    Comment: Please note change in reference range.   Creatinine, Ser 2.87 (H) 0.61 - 1.24 mg/dL   Calcium 8.3 (L) 8.9 - 10.3 mg/dL   Total Protein 5.7 (L) 6.5 - 8.1 g/dL   Albumin 2.3 (L) 3.5 - 5.0 g/dL   AST 15 15 - 41 U/L   ALT 18 0 - 44 U/L    Comment: Please note change in reference range.   Alkaline Phosphatase 77 38 - 126 U/L   Total Bilirubin 0.7 0.3 - 1.2 mg/dL   GFR calc non Af Amer 19 (L) >60 mL/min   GFR calc Af Amer 22 (L) >60 mL/min    Comment: (NOTE) The eGFR has been calculated using the CKD EPI equation. This calculation has not been validated in all clinical situations. eGFR's persistently <60 mL/min signify possible Chronic Kidney Disease.    Anion gap 12 5 - 15    Comment: Performed at [facility], [place] [address], [place], [place] [address]  Renal function panel     Status: Abnormal   Collection Time: 09/09/17  8:31 AM  Result Value Ref Range   Sodium 137 135 - 145 mmol/L   Potassium 3.8 3.5 - 5.1 mmol/L   Chloride 108 98 - 111 mmol/L    Comment: Please note change in reference range.   CO2 20 (L) 22 -  32 mmol/L   Glucose, Bld 112 (H) 70 - 99 mg/dL    Comment: Please note change in reference range.   BUN 30 (H) 8 - 23 mg/dL    Comment: Please note change in reference range.   Creatinine, Ser 2.74 (H) 0.61 - 1.24 mg/dL   Calcium 8.3 (L) 8.9 - 10.3 mg/dL   Phosphorus 3.6 2.5 - 4.6 mg/dL   Albumin 2.2 (L) 3.5 - 5.0 g/dL   GFR calc non Af Amer 20 (L) >60 mL/min   GFR calc Af Amer 24 (L) >60 mL/min    Comment: (NOTE) The eGFR has been calculated using the CKD EPI equation. This calculation has not been validated in all clinical situations. eGFR's persistently <60 mL/min signify possible Chronic Kidney Disease.    Anion gap 9 5 - 15    Comment: Performed at [facility], [place] [address], [place], [place] [address]  CBC with Differential/Platelet     Status: Abnormal    Collection Time: 09/09/17  8:31 AM  Result Value Ref Range   WBC 15.6 (H) 4.0 - 10.5 K/uL   RBC 3.38 (L) 4.22 - 5.81 MIL/uL   Hemoglobin 9.5 (L) 13.0 - 17.0 g/dL   HCT 29.8 (L) 39.0 - 52.0 %   MCV 88.2 78.0 - 100.0 fL   MCH 28.1 26.0 - 34.0 pg   MCHC 31.9 30.0 - 36.0 g/dL   RDW 13.7 11.5 - 15.5 %   Platelets 294 150 - 400 K/uL   Neutrophils Relative % 78 %   Neutro Abs 12.2 (H) 1.7 - 7.7 K/uL   Lymphocytes Relative 12 %   Lymphs Abs 1.9 0.7 - 4.0 K/uL   Monocytes Relative 6 %   Monocytes Absolute 0.9 0.1 - 1.0 K/uL   Eosinophils Relative 4 %   Eosinophils Absolute 0.6 0.0 - 0.7 K/uL   Basophils Relative 0 %   Basophils Absolute 0.0 0.0 - 0.1 K/uL    Comment: Performed at [facility], [place] [address], [place], [place] [address]      Component Value Date/Time   SDES  09/08/2017 0542    BLOOD RIGHT ANTECUBITAL Performed at [facility] [address] [place], [place] [address]    SDES  09/08/2017 0542    BLOOD RIGHT HAND Performed at [facility], [place] [address], [place], [place] [address]    SPECREQUEST  09/08/2017 0542    BOTTLES DRAWN AEROBIC AND ANAEROBIC Blood Culture adequate volume Performed at [facility] [address], [place], [place] [address]    SPECREQUEST  09/08/2017 0542    BOTTLES DRAWN AEROBIC AND ANAEROBIC Blood Culture adequate volume Performed at [facility] [address] [place], [place] [address]    CULT  09/08/2017 0542    NO GROWTH 1 DAY Performed at [facility] [address], [place], [place] [address]    CULT  09/08/2017 0542    NO GROWTH 1 DAY Performed at [facility], [place] [address] [place],  [address]    REPTSTATUS PENDING 09/08/2017 0542   REPTSTATUS PENDING 09/08/2017 0542   No results found. Recent Results (from the past 240 hour(s))  Blood culture (routine x 2)     Status: Abnormal   Collection Time:  09/06/17  3:48 PM  Result Value Ref Range Status   Specimen Description   Final    BLOOD LEFT WRIST Performed at [facility],  [place] [address], [place], [place] [address]    Special Requests   Final    BOTTLES DRAWN AEROBIC AND ANAEROBIC Blood Culture results may not be optimal due to an inadequate volume of blood received in culture bottles Performed at [facility] [address], [place], [place] [address]    Culture  Setup Time   Final    GRAM NEGATIVE RODS IN BOTH AEROBIC AND ANAEROBIC BOTTLES CRITICAL RESULT CALLED TO, READ BACK BY AND VERIFIED WITH: Damian Leavell [id number] MLM Performed at [facility], [address], [place], [place] [address]    Culture ESCHERICHIA COLI (A)  Final   Report Status 09/09/2017 FINAL  Final   Organism ID, Bacteria ESCHERICHIA COLI  Final      Susceptibility   Escherichia coli - MIC*    AMPICILLIN >=32 RESISTANT Resistant     CEFAZOLIN <=4 SENSITIVE Sensitive     CEFEPIME <=1 SENSITIVE Sensitive     CEFTAZIDIME <=1 SENSITIVE Sensitive     CEFTRIAXONE <=1 SENSITIVE Sensitive     CIPROFLOXACIN <=0.25 SENSITIVE Sensitive     GENTAMICIN <=1 SENSITIVE Sensitive     IMIPENEM <=0.25 SENSITIVE Sensitive     TRIMETH/SULFA <=20 SENSITIVE Sensitive     AMPICILLIN/SULBACTAM 16 INTERMEDIATE Intermediate     PIP/TAZO <=4 SENSITIVE Sensitive     Extended ESBL NEGATIVE Sensitive     * ESCHERICHIA COLI  Blood Culture ID Panel (Reflexed)     Status: Abnormal   Collection Time: 09/06/17  3:48 PM  Result Value Ref Range Status   Enterococcus species NOT DETECTED NOT DETECTED Final   Listeria monocytogenes NOT DETECTED NOT DETECTED Final   Staphylococcus species NOT DETECTED NOT DETECTED Final   Staphylococcus aureus NOT DETECTED NOT DETECTED Final   Streptococcus species NOT DETECTED NOT DETECTED Final   Streptococcus agalactiae NOT DETECTED NOT DETECTED Final   Streptococcus pneumoniae NOT DETECTED NOT  DETECTED Final   Streptococcus pyogenes NOT DETECTED NOT DETECTED Final   Acinetobacter baumannii NOT DETECTED NOT DETECTED Final   Enterobacteriaceae species DETECTED (A) NOT DETECTED Final    Comment: Enterobacteriaceae represent a large family of gram-negative bacteria, not a single organism. CRITICAL RESULT CALLED TO, READ BACK BY AND VERIFIED WITH: PHARMD J GADHIA [phone] MLM    Enterobacter cloacae complex NOT DETECTED NOT DETECTED Final   Escherichia coli DETECTED (A) NOT DETECTED Final    Comment: CRITICAL RESULT CALLED TO, READ BACK BY AND VERIFIED WITH: PHARMD J GADHIA [id number] 0921 MLM    Klebsiella oxytoca NOT DETECTED NOT DETECTED Final   Klebsiella pneumoniae NOT DETECTED NOT DETECTED Final   Proteus species NOT DETECTED NOT DETECTED Final   Serratia marcescens NOT DETECTED NOT DETECTED Final   Carbapenem resistance NOT DETECTED NOT DETECTED Final   Haemophilus influenzae NOT DETECTED NOT DETECTED Final   Neisseria meningitidis NOT DETECTED NOT DETECTED Final   Pseudomonas aeruginosa NOT DETECTED NOT DETECTED Final   Candida albicans NOT DETECTED NOT DETECTED Final   Candida glabrata NOT DETECTED NOT DETECTED Final   Candida krusei NOT DETECTED NOT DETECTED Final   Candida parapsilosis NOT DETECTED NOT DETECTED Final   Candida tropicalis NOT DETECTED NOT DETECTED Final    Comment: Performed at [facility], [place] [address] [place], [place] [address]  Blood culture (routine x 2)     Status: None (Preliminary result)   Collection Time: 09/06/17  3:53 PM  Result Value Ref Range Status   Specimen  Description   Final    BLOOD RIGHT WRIST Performed at [facility] [address], [place], [place] [address]    Special Requests   Final    BOTTLES DRAWN AEROBIC AND ANAEROBIC Blood Culture results may not be optimal due to an excessive volume of blood received in culture bottles Performed at [facility] [place] [address],  [place], [place] [address]    Culture   Final    NO GROWTH 3 DAYS Performed at [facility], [place] [address], [place], [place] [address]    Report Status PENDING  Incomplete  Urine culture     Status: Abnormal (Preliminary result)   Collection Time: 09/06/17  7:24 PM  Result Value Ref Range Status   Specimen Description   Final    URINE, CLEAN CATCH Performed at [facility], [place] [address] [place], [place] [address]    Special Requests   Final    Normal Performed at [facility], [place] [address], [place], [place] [address]    Culture (A)  Final    60,000 COLONIES/mL ESCHERICHIA COLI 60,000 COLONIES/mL ENTEROCOCCUS FAECALIS    Report Status PENDING  Incomplete  Culture, blood (routine x 2)     Status: None (Preliminary result)   Collection Time: 09/08/17  5:42 AM  Result Value Ref Range Status   Specimen Description   Final    BLOOD RIGHT ANTECUBITAL Performed at [facility] [address], [place], [place] [address]    Special Requests   Final    BOTTLES DRAWN AEROBIC AND ANAEROBIC Blood Culture adequate volume Performed at [facility] [address], [place], [place] [address]    Culture   Final    NO GROWTH 1 DAY Performed at [facility], [place] [address] [place], [place] [address]    Report Status PENDING  Incomplete  Culture, blood (routine x 2)     Status: None (Preliminary result)   Collection Time: 09/08/17  5:42 AM  Result Value Ref Range Status   Specimen Description   Final    BLOOD RIGHT HAND Performed at [facility] [address] [place], [place] [address]    Special Requests   Final    BOTTLES DRAWN AEROBIC AND ANAEROBIC Blood Culture adequate volume Performed at [facility] [address], [place], [place] [address]    Culture   Final    NO GROWTH 1 DAY Performed at [facility], [place] [address] [place] [address] [place], [place]  [address]    Report Status PENDING  Incomplete    Microbiology: Recent Results (from the past 240 hour(s))  Blood culture (routine x 2)     Status: Abnormal   Collection Time: 09/06/17  3:48 PM  Result Value Ref Range Status   Specimen Description   Final    BLOOD LEFT WRIST Performed at [facility], [place] [address] [place], [place] [address]    Special Requests   Final    BOTTLES DRAWN AEROBIC AND ANAEROBIC Blood Culture results may not be optimal due to an inadequate volume of blood received in culture bottles Performed at [place] [address], [place], [place] [address]    Culture  Setup Time   Final    GRAM NEGATIVE RODS IN BOTH AEROBIC AND ANAEROBIC BOTTLES CRITICAL RESULT CALLED TO, READ BACK BY AND VERIFIED WITH: Damian Leavell [phone]  MLM Performed at [facility], [place] [address] [place], [place] [address]    Culture ESCHERICHIA COLI (A)  Final   Report Status 09/09/2017 FINAL  Final   Organism ID, Bacteria ESCHERICHIA COLI  Final      Susceptibility   Escherichia coli - MIC*    AMPICILLIN >=32 RESISTANT Resistant     CEFAZOLIN <=4 SENSITIVE Sensitive     CEFEPIME <=1 SENSITIVE Sensitive     CEFTAZIDIME <=1 SENSITIVE Sensitive     CEFTRIAXONE <=1 SENSITIVE Sensitive     CIPROFLOXACIN <=0.25 SENSITIVE Sensitive     GENTAMICIN <=1 SENSITIVE Sensitive     IMIPENEM <=0.25 SENSITIVE Sensitive     TRIMETH/SULFA <=20 SENSITIVE Sensitive     AMPICILLIN/SULBACTAM 16 INTERMEDIATE Intermediate     PIP/TAZO <=4 SENSITIVE Sensitive     Extended ESBL NEGATIVE Sensitive     * ESCHERICHIA COLI  Blood Culture ID Panel (Reflexed)     Status: Abnormal   Collection Time: 09/06/17  3:48 PM  Result Value Ref Range Status   Enterococcus species NOT DETECTED NOT DETECTED Final   Listeria monocytogenes NOT DETECTED NOT DETECTED Final   Staphylococcus species NOT DETECTED NOT DETECTED Final   Staphylococcus aureus NOT DETECTED NOT  DETECTED Final   Streptococcus species NOT DETECTED NOT DETECTED Final   Streptococcus agalactiae NOT DETECTED NOT DETECTED Final   Streptococcus pneumoniae NOT DETECTED NOT DETECTED Final   Streptococcus pyogenes NOT DETECTED NOT DETECTED Final   Acinetobacter baumannii NOT DETECTED NOT DETECTED Final   Enterobacteriaceae species DETECTED (A) NOT DETECTED Final    Comment: Enterobacteriaceae represent a large family of gram-negative bacteria, not a single organism. CRITICAL RESULT CALLED TO, READ BACK BY AND VERIFIED WITH: PHARMD J GADHIA [phone] MLM    Enterobacter cloacae complex NOT DETECTED NOT DETECTED Final   Escherichia coli DETECTED (A) NOT DETECTED Final    Comment: CRITICAL RESULT CALLED TO, READ BACK BY AND VERIFIED WITH: PHARMD J GADHIA [phone] MLM    Klebsiella oxytoca NOT DETECTED NOT DETECTED Final   Klebsiella pneumoniae NOT DETECTED NOT DETECTED Final   Proteus species NOT DETECTED NOT DETECTED Final   Serratia marcescens NOT DETECTED NOT DETECTED Final   Carbapenem resistance NOT DETECTED NOT DETECTED Final   Haemophilus influenzae NOT DETECTED NOT DETECTED Final   Neisseria meningitidis NOT DETECTED NOT DETECTED Final   Pseudomonas aeruginosa NOT DETECTED NOT DETECTED Final   Candida albicans NOT DETECTED NOT DETECTED Final   Candida glabrata NOT DETECTED NOT DETECTED Final   Candida krusei NOT DETECTED NOT DETECTED Final   Candida parapsilosis NOT DETECTED NOT DETECTED Final   Candida tropicalis NOT DETECTED NOT DETECTED Final    Comment: Performed at [facility], [place] [address], [place], [place] [address]  Blood culture (routine x 2)     Status: None (Preliminary result)   Collection Time: 09/06/17  3:53 PM  Result Value Ref Range Status   Specimen Description   Final    BLOOD RIGHT WRIST Performed at [facility] [address], [place], [place] [address]    Special Requests   Final    BOTTLES DRAWN AEROBIC AND ANAEROBIC  Blood Culture results may not be optimal due to an excessive volume of blood received in culture bottles Performed at [facility] [address] [place], [place] [address]    Culture   Final    NO GROWTH 3 DAYS Performed at [facility],  [address], [place], [place] [address]    Report Status PENDING  Incomplete  Urine culture     Status: Abnormal (Preliminary result)   Collection Time: 09/06/17  7:24 PM  Result Value Ref Range Status   Specimen Description   Final    URINE, CLEAN CATCH Performed at [facility], [place] [address], [place], [place] [address]    Special Requests   Final    Normal Performed at [facility], [place] [address], [place], [place] [address]    Culture (A)  Final    60,000 COLONIES/mL ESCHERICHIA COLI 60,000 COLONIES/mL ENTEROCOCCUS FAECALIS    Report Status PENDING  Incomplete  Culture, blood (routine x 2)     Status: None (Preliminary result)   Collection Time: 09/08/17  5:42 AM  Result Value Ref Range Status   Specimen Description   Final    BLOOD RIGHT ANTECUBITAL Performed at [place] [address], [place], [place] [address]    Special Requests   Final    BOTTLES DRAWN AEROBIC AND ANAEROBIC Blood Culture adequate volume Performed at [facility] [address], [place], [place] [address]    Culture   Final    NO GROWTH 1 DAY Performed at [facility], [place] [address] [place], [place] [address]    Report Status PENDING  Incomplete  Culture, blood (routine x 2)     Status: None (Preliminary result)   Collection Time: 09/08/17  5:42 AM  Result Value Ref Range Status   Specimen Description   Final    BLOOD RIGHT HAND Performed at [facility] [address], [place], [place] [address]    Special Requests   Final    BOTTLES DRAWN AEROBIC AND ANAEROBIC Blood Culture adequate volume Performed at [facility] [address], [place], [place] [address]    Culture   Final    NO GROWTH 1 DAY Performed at [facility], [place] [address], [place], [place] [address]    Report Status PENDING  Incomplete    Radiographs and labs were personally reviewed by me.   Bobby Rumpf, MD [facility] [phone] 09/09/2017, 3:24 PM

## 2017-09-09 NOTE — Progress Notes (Signed)
Subjective: Patient reports feeling better  Objective: Vital signs in last 24 hours: Temp:  [98.3 F (36.8 C)-99 F (37.2 C)] 98.3 F (36.8 C) (07/19 0544) Pulse Rate:  [64-129] 64 (07/19 0544) Resp:  [18-20] 20 (07/19 0544) BP: (154-163)/(70-86) 154/86 (07/19 0544) SpO2:  [94 %-98 %] 98 % (07/19 0544)  Intake/Output from previous day: 07/18 0701 - 07/19 0700 In: 692.9 [P.O.:540; IV Piggyback:152.9] Out: 1525 [Urine:1525] Intake/Output this shift: No intake/output data recorded.  Physical Exam:  Constitutional: Vital signs reviewed. WD WN in NAD   Eyes: PERRL, No scleral icterus.   Cardiovascular: RRR Pulmonary/Chest: Normal effort Abdominal: Soft. Non-tender, non-distended, bowel sounds are normal, no masses, organomegaly, or guarding present.  Genitourinary: Rt testicular swelling/tenderness Extremities: No cyanosis or edema   Lab Results: Recent Labs    09/06/17 1327 09/07/17 0422 09/08/17 0542  HGB 11.7* 9.5* 9.4*  HCT 36.4* 29.9* 29.0*   BMET Recent Labs    09/07/17 0422 09/08/17 0542  NA 140 139  K 3.8 4.2  CL 109 110  CO2 23 17*  GLUCOSE 122* 108*  BUN 43* 36*  CREATININE 3.04* 2.87*  CALCIUM 8.2* 8.3*   No results for input(s): LABPT, INR in the last 72 hours. No results for input(s): LABURIN in the last 72 hours. Results for orders placed or performed during the hospital encounter of 09/06/17  Blood culture (routine x 2)     Status: Abnormal   Collection Time: 09/06/17  3:48 PM  Result Value Ref Range Status   Specimen Description   Final    BLOOD LEFT WRIST Performed at [facility] [address], [place], [place] [address]    Special Requests   Final    BOTTLES DRAWN AEROBIC AND ANAEROBIC Blood Culture results may not be optimal due to an inadequate volume of blood received in culture bottles Performed at [facility] [address] [place], [place] [address]    Culture  Setup Time   Final     GRAM NEGATIVE RODS IN BOTH AEROBIC AND ANAEROBIC BOTTLES CRITICAL RESULT CALLED TO, READ BACK BY AND VERIFIED WITH: Damian Leavell [id number] MLM Performed at [facility], [address] [place], [place] [address]    Culture ESCHERICHIA COLI (A)  Final   Report Status 09/09/2017 FINAL  Final   Organism ID, Bacteria ESCHERICHIA COLI  Final      Susceptibility   Escherichia coli - MIC*    AMPICILLIN >=32 RESISTANT Resistant     CEFAZOLIN <=4 SENSITIVE Sensitive     CEFEPIME <=1 SENSITIVE Sensitive     CEFTAZIDIME <=1 SENSITIVE Sensitive     CEFTRIAXONE <=1 SENSITIVE Sensitive     CIPROFLOXACIN <=0.25 SENSITIVE Sensitive     GENTAMICIN <=1 SENSITIVE Sensitive     IMIPENEM <=0.25 SENSITIVE Sensitive     TRIMETH/SULFA <=20 SENSITIVE Sensitive     AMPICILLIN/SULBACTAM 16 INTERMEDIATE Intermediate     PIP/TAZO <=4 SENSITIVE Sensitive     Extended ESBL NEGATIVE Sensitive     * ESCHERICHIA COLI  Blood Culture ID Panel (Reflexed)     Status: Abnormal   Collection Time: 09/06/17  3:48 PM  Result Value Ref Range Status   Enterococcus species NOT DETECTED NOT DETECTED Final   Listeria monocytogenes NOT DETECTED NOT DETECTED Final   Staphylococcus species NOT DETECTED NOT DETECTED Final   Staphylococcus aureus NOT DETECTED NOT DETECTED Final   Streptococcus species NOT DETECTED NOT DETECTED Final   Streptococcus agalactiae  NOT DETECTED NOT DETECTED Final   Streptococcus pneumoniae NOT DETECTED NOT DETECTED Final   Streptococcus pyogenes NOT DETECTED NOT DETECTED Final   Acinetobacter baumannii NOT DETECTED NOT DETECTED Final   Enterobacteriaceae species DETECTED (A) NOT DETECTED Final    Comment: Enterobacteriaceae represent a large family of gram-negative bacteria, not a single organism. CRITICAL RESULT CALLED TO, READ BACK BY AND VERIFIED WITH: PHARMD J GADHIA [phone] MLM    Enterobacter cloacae complex NOT DETECTED NOT DETECTED Final   Escherichia coli DETECTED (A) NOT  DETECTED Final    Comment: CRITICAL RESULT CALLED TO, READ BACK BY AND VERIFIED WITH: PHARMD J GADHIA [id number] MLM    Klebsiella oxytoca NOT DETECTED NOT DETECTED Final   Klebsiella pneumoniae NOT DETECTED NOT DETECTED Final   Proteus species NOT DETECTED NOT DETECTED Final   Serratia marcescens NOT DETECTED NOT DETECTED Final   Carbapenem resistance NOT DETECTED NOT DETECTED Final   Haemophilus influenzae NOT DETECTED NOT DETECTED Final   Neisseria meningitidis NOT DETECTED NOT DETECTED Final   Pseudomonas aeruginosa NOT DETECTED NOT DETECTED Final   Candida albicans NOT DETECTED NOT DETECTED Final   Candida glabrata NOT DETECTED NOT DETECTED Final   Candida krusei NOT DETECTED NOT DETECTED Final   Candida parapsilosis NOT DETECTED NOT DETECTED Final   Candida tropicalis NOT DETECTED NOT DETECTED Final    Comment: Performed at [facility], [place] [address], [place], [place] [address]  Blood culture (routine x 2)     Status: None (Preliminary result)   Collection Time: 09/06/17  3:53 PM  Result Value Ref Range Status   Specimen Description   Final    BLOOD RIGHT WRIST Performed at [facility] [address] [place], [place] [address]    Special Requests   Final    BOTTLES DRAWN AEROBIC AND ANAEROBIC Blood Culture results may not be optimal due to an excessive volume of blood received in culture bottles Performed at [facility] [address] [place], [place] [address]    Culture   Final    NO GROWTH 2 DAYS Performed at [facility] [address], [place], [place] [address]    Report Status PENDING  Incomplete  Urine culture     Status: Abnormal (Preliminary result)   Collection Time: 09/06/17  7:24 PM  Result Value Ref Range Status   Specimen Description   Final    URINE, CLEAN CATCH Performed at [facility], [place] [address] [place], [place] [address]    Special Requests   Final     Normal Performed at [facility], [place] [address], [place], [place] [address]    Culture (A)  Final    60,000 COLONIES/mL Lonell Grandchild NEGATIVE RODS 60,000 COLONIES/mL UNIDENTIFIED ORGANISM Performed at [facility], [place] [address], [place], [place] [address]    Report Status PENDING  Incomplete    Studies/Results: No results found.  Assessment/Plan:   UTI--emphysematous cystitis w/ orchitis  Retention/BPH  I'll ask ID to assess/suggest further mgmt  [place] TURP/Urolift  folloeing urodynamic testing to get im catheter free   LOS: 3 days   Jorja Loa 09/09/2017, 8:09 AM

## 2017-09-10 DIAGNOSIS — Z1611 Resistance to penicillins: Secondary | ICD-10-CM

## 2017-09-10 DIAGNOSIS — Z1629 Resistance to other single specified antibiotic: Secondary | ICD-10-CM

## 2017-09-10 LAB — RENAL FUNCTION PANEL
ANION GAP: 8 (ref 5–15)
Albumin: 2.1 g/dL — ABNORMAL LOW (ref 3.5–5.0)
BUN: 28 mg/dL — AB (ref 8–23)
CHLORIDE: 107 mmol/L (ref 98–111)
CO2: 23 mmol/L (ref 22–32)
Calcium: 8.3 mg/dL — ABNORMAL LOW (ref 8.9–10.3)
Creatinine, Ser: 2.7 mg/dL — ABNORMAL HIGH (ref 0.61–1.24)
GFR calc Af Amer: 24 mL/min — ABNORMAL LOW (ref >60)
GFR calc non Af Amer: 21 mL/min — ABNORMAL LOW (ref >60)
GLUCOSE: 87 mg/dL (ref 70–99)
POTASSIUM: 4 mmol/L (ref 3.5–5.1)
Phosphorus: 4.3 mg/dL (ref 2.5–4.6)
Sodium: 138 mmol/L (ref 135–145)

## 2017-09-10 LAB — URINE CULTURE: SPECIAL REQUESTS: NORMAL

## 2017-09-10 MED ORDER — MENTHOL 3 MG MT LOZG
1.0000 | LOZENGE | OROMUCOSAL | Status: DC | PRN
  Administered 2017-09-11 – 2017-09-13 (×2): 3 mg via ORAL
  Filled 2017-09-10: qty 9

## 2017-09-10 NOTE — Progress Notes (Signed)
INFECTIOUS DISEASE PROGRESS NOTE  ID: Kenneth Williams is a 82 y.o. male with  Active Problems:   Hypothyroidism   TIA (transient ischemic attack)   Carotid artery stenosis   BPH (benign prostatic hyperplasia)   UTI (urinary tract infection)  Subjective: No complaints.  Testicular pain better.   Abtx:  Anti-infectives (From admission, onward)   Start     Dose/Rate Route Frequency Ordered Stop   09/09/17 1800  piperacillin-tazobactam (ZOSYN) IVPB 3.375 g     3.375 g 12.5 mL/hr over 240 Minutes Intravenous Every 8 hours 09/09/17 1331     09/09/17 1300  cefTRIAXone (ROCEPHIN) 2 g in sodium chloride 0.9 % 100 mL IVPB  Status:  Discontinued     2 g 200 mL/hr over 30 Minutes Intravenous Every 24 hours 09/09/17 1146 09/09/17 1314   09/07/17 0000  piperacillin-tazobactam (ZOSYN) IVPB 3.375 g  Status:  Discontinued     3.375 g 12.5 mL/hr over 240 Minutes Intravenous Every 8 hours 09/06/17 2259 09/09/17 1146   09/06/17 1600  ceFEPIme (MAXIPIME) 2 g in sodium chloride 0.9 % 100 mL IVPB  Status:  Discontinued     2 g 200 mL/hr over 30 Minutes Intravenous  Once 09/06/17 1528 09/06/17 1539   09/06/17 1545  piperacillin-tazobactam (ZOSYN) IVPB 3.375 g     3.375 g 100 mL/hr over 30 Minutes Intravenous  Once 09/06/17 1543 09/06/17 1644      Medications:  Scheduled: . alfuzosin  10 mg Oral Q breakfast  . amLODipine  2.5 mg Oral Daily  . cholecalciferol  2,000 Units Oral Daily  . finasteride  5 mg Oral Daily  . heparin injection (subcutaneous)  5,000 Units Subcutaneous Q8H  . labetalol  300 mg Oral BID  . levothyroxine  88 mcg Oral QAC breakfast  . polyethylene glycol  17 g Oral Daily  . pravastatin  20 mg Oral Daily  . senna-docusate  1 tablet Oral BID    Objective: Vital signs in last 24 hours: Temp:  [98.6 F (37 C)-99.5 F (37.5 C)] 98.6 F (37 C) (07/20 1330) Pulse Rate:  [61-68] 61 (07/20 1330) Resp:  [12-20] 18 (07/20 1330) BP: (146-161)/(69-82) 146/73 (07/20  1330) SpO2:  [94 %-98 %] 95 % (07/20 1330)   General appearance: alert, cooperative and no distress Resp: clear to auscultation bilaterally Cardio: regular rate and rhythm GI: normal findings: bowel sounds normal and soft, non-tender  Lab Results Recent Labs    09/08/17 0542 09/09/17 0831 09/10/17 0416  WBC 16.5* 15.6*  --   HGB 9.4* 9.5*  --   HCT 29.0* 29.8*  --   NA 139 137 138  K 4.2 3.8 4.0  CL 110 108 107  CO2 17* 20* 23  BUN 36* 30* 28*  CREATININE 2.87* 2.74* 2.70*   Liver Panel Recent Labs    09/08/17 0542 09/09/17 0831 09/10/17 0416  PROT 5.7*  --   --   ALBUMIN 2.3* 2.2* 2.1*  AST 15  --   --   ALT 18  --   --   ALKPHOS 77  --   --   BILITOT 0.7  --   --    Sedimentation Rate No results for input(s): ESRSEDRATE in the last 72 hours. C-Reactive Protein No results for input(s): CRP in the last 72 hours.  Microbiology: Recent Results (from the past 240 hour(s))  Blood culture (routine x 2)     Status: Abnormal   Collection Time: 09/06/17  3:48 PM  Result Value Ref Range Status   Specimen Description   Final    BLOOD LEFT WRIST Performed at [facility] [address], [place], [place] [address]    Special Requests   Final    BOTTLES DRAWN AEROBIC AND ANAEROBIC Blood Culture results may not be optimal due to an inadequate volume of blood received in culture bottles Performed at [facility] [address], [place], [place] [address]    Culture  Setup Time   Final    GRAM NEGATIVE RODS IN BOTH AEROBIC AND ANAEROBIC BOTTLES CRITICAL RESULT CALLED TO, READ BACK BY AND VERIFIED WITH: Damian Leavell [phone] MLM Performed at [facility], [address] [place], [place] [address]    Culture ESCHERICHIA COLI (A)  Final   Report Status 09/09/2017 FINAL  Final   Organism ID, Bacteria ESCHERICHIA COLI  Final      Susceptibility   Escherichia coli - MIC*    AMPICILLIN >=32 RESISTANT Resistant      CEFAZOLIN <=4 SENSITIVE Sensitive     CEFEPIME <=1 SENSITIVE Sensitive     CEFTAZIDIME <=1 SENSITIVE Sensitive     CEFTRIAXONE <=1 SENSITIVE Sensitive     CIPROFLOXACIN <=0.25 SENSITIVE Sensitive     GENTAMICIN <=1 SENSITIVE Sensitive     IMIPENEM <=0.25 SENSITIVE Sensitive     TRIMETH/SULFA <=20 SENSITIVE Sensitive     AMPICILLIN/SULBACTAM 16 INTERMEDIATE Intermediate     PIP/TAZO <=4 SENSITIVE Sensitive     Extended ESBL NEGATIVE Sensitive     * ESCHERICHIA COLI  Blood Culture ID Panel (Reflexed)     Status: Abnormal   Collection Time: 09/06/17  3:48 PM  Result Value Ref Range Status   Enterococcus species NOT DETECTED NOT DETECTED Final   Listeria monocytogenes NOT DETECTED NOT DETECTED Final   Staphylococcus species NOT DETECTED NOT DETECTED Final   Staphylococcus aureus NOT DETECTED NOT DETECTED Final   Streptococcus species NOT DETECTED NOT DETECTED Final   Streptococcus agalactiae NOT DETECTED NOT DETECTED Final   Streptococcus pneumoniae NOT DETECTED NOT DETECTED Final   Streptococcus pyogenes NOT DETECTED NOT DETECTED Final   Acinetobacter baumannii NOT DETECTED NOT DETECTED Final   Enterobacteriaceae species DETECTED (A) NOT DETECTED Final    Comment: Enterobacteriaceae represent a large family of gram-negative bacteria, not a single organism. CRITICAL RESULT CALLED TO, READ BACK BY AND VERIFIED WITH: PHARMD J GADHIA [id number] MLM    Enterobacter cloacae complex NOT DETECTED NOT DETECTED Final   Escherichia coli DETECTED (A) NOT DETECTED Final    Comment: CRITICAL RESULT CALLED TO, READ BACK BY AND VERIFIED WITH: PHARMD J GADHIA [id number] 0921 MLM    Klebsiella oxytoca NOT DETECTED NOT DETECTED Final   Klebsiella pneumoniae NOT DETECTED NOT DETECTED Final   Proteus species NOT DETECTED NOT DETECTED Final   Serratia marcescens NOT DETECTED NOT DETECTED Final   Carbapenem resistance NOT DETECTED NOT DETECTED Final   Haemophilus influenzae NOT DETECTED NOT DETECTED Final    Neisseria meningitidis NOT DETECTED NOT DETECTED Final   Pseudomonas aeruginosa NOT DETECTED NOT DETECTED Final   Candida albicans NOT DETECTED NOT DETECTED Final   Candida glabrata NOT DETECTED NOT DETECTED Final   Candida krusei NOT DETECTED NOT DETECTED Final   Candida parapsilosis NOT DETECTED NOT DETECTED Final   Candida tropicalis NOT DETECTED NOT DETECTED Final    Comment: Performed at [facility], [place] [address], [place], [place] [address]  Blood culture (routine x 2)  Status: None (Preliminary result)   Collection Time: 09/06/17  3:53 PM  Result Value Ref Range Status   Specimen Description   Final    BLOOD RIGHT WRIST Performed at [facility] [address], [place], [place] [address]    Special Requests   Final    BOTTLES DRAWN AEROBIC AND ANAEROBIC Blood Culture results may not be optimal due to an excessive volume of blood received in culture bottles Performed at [facility] [address], [place], [place] [address]    Culture   Final    NO GROWTH 4 DAYS Performed at [facility], [place] [address], [place], [place] [address]    Report Status PENDING  Incomplete  Urine culture     Status: Abnormal   Collection Time: 09/06/17  7:24 PM  Result Value Ref Range Status   Specimen Description   Final    URINE, CLEAN CATCH Performed at [facility], [place] [address] [place], [place] [address]    Special Requests   Final    Normal Performed at [facility], [place] [address], [place], [place] [address]    Culture (A)  Final    60,000 COLONIES/mL ESCHERICHIA COLI 60,000 COLONIES/mL ENTEROCOCCUS FAECALIS    Report Status 09/10/2017 FINAL  Final   Organism ID, Bacteria ESCHERICHIA COLI (A)  Final   Organism ID, Bacteria ENTEROCOCCUS FAECALIS (A)  Final      Susceptibility   Escherichia coli - MIC*    AMPICILLIN >=32 RESISTANT Resistant     CEFAZOLIN <=4 SENSITIVE Sensitive      CEFTRIAXONE <=1 SENSITIVE Sensitive     CIPROFLOXACIN <=0.25 SENSITIVE Sensitive     GENTAMICIN <=1 SENSITIVE Sensitive     IMIPENEM <=0.25 SENSITIVE Sensitive     NITROFURANTOIN <=16 SENSITIVE Sensitive     TRIMETH/SULFA <=20 SENSITIVE Sensitive     AMPICILLIN/SULBACTAM 16 INTERMEDIATE Intermediate     PIP/TAZO <=4 SENSITIVE Sensitive     Extended ESBL NEGATIVE Sensitive     * 60,000 COLONIES/mL ESCHERICHIA COLI   Enterococcus faecalis - MIC*    AMPICILLIN <=2 SENSITIVE Sensitive     LEVOFLOXACIN >=8 RESISTANT Resistant     NITROFURANTOIN <=16 SENSITIVE Sensitive     VANCOMYCIN 1 SENSITIVE Sensitive     * 60,000 COLONIES/mL ENTEROCOCCUS FAECALIS  Culture, blood (routine x 2)     Status: None (Preliminary result)   Collection Time: 09/08/17  5:42 AM  Result Value Ref Range Status   Specimen Description   Final    BLOOD RIGHT ANTECUBITAL Performed at [facility] [address] [place], [place] [address]    Special Requests   Final    BOTTLES DRAWN AEROBIC AND ANAEROBIC Blood Culture adequate volume Performed at [facility] [address], [place], [place] [address]    Culture   Final    NO GROWTH 2 DAYS Performed at [facility] [address], [place], [place] [address]    Report Status PENDING  Incomplete  Culture, blood (routine x 2)     Status: None (Preliminary result)   Collection Time: 09/08/17  5:42 AM  Result Value Ref Range Status   Specimen Description   Final    BLOOD RIGHT HAND Performed at [facility] [address], [place], [place] [address]    Special Requests   Final    BOTTLES DRAWN AEROBIC AND ANAEROBIC Blood Culture adequate volume Performed at  [facility], [place] [address], [place], [place] [address]    Culture   Final    NO GROWTH 2 DAYS Performed at [facility] [address] [place], [place] [address]    Report Status PENDING  Incomplete     Studies/Results: No results found.   Assessment/Plan: E coli bacteremia UTI (e coli R- amp, enterococcus- R- levaquin) Emphysematous pyelonephritis Prev pseudomonas UTI (S- zosyn) AKI on CKD   Total days of antibiotics: 5 zosyn  Will continue zosyn Will avoid vanco with his CKD. Improving. Will f/u on 7-22          Bobby Rumpf MD, FACP Infectious Diseases (pager) [phone] [email or web address] 09/10/2017, 3:09 PM  LOS: 4 days

## 2017-09-10 NOTE — Progress Notes (Signed)
[facility] Progress Note  Patient: Kenneth Williams:[id number]   PCP: Cassandria Anger, MD DOB: 1936/01/07   DOA: 09/06/2017   DOS: 09/10/2017   Date of Service: the patient was seen and examined on 09/10/2017  Subjective: No acute complaint no nausea no vomiting no fever no chills.  Minimal oral intake.  Constipation resolved.  Bladder spasm resolved. . Brief hospital course: Pt. with PMH of BPH, hypertension, TIA, CKD stage III; admitted on 09/06/2017, presented with complaint of hematuria, was found to have sepsis secondary to E. coli UTI. Currently further plan is for continue IV antibiotics.  Assessment and Plan: 1.  Sepsis secondary to E. coli bacteremia and E. coli and enterococcus UTI. Hematuria. Indwelling Foley catheter. Chronic urinary retention. Urology consulted. Urine culture performed in the clinic. Blood culture performed here is positive for E. coli. Discussed with urology, patient has history of Pseudomonas as well as emphysematous cystitis and therefore they want to continue broad-spectrum antibiotics. Urine culture performed in the clinic showed E. coli as well as enterococcus. Urine culture performed here in the hospital is showing E. coli and enterococcus. Blood cultures growing E. coli only mostly sensitive to all antibiotics. Difficult choices, enterococcus is resistant to levofloxacin and E. coli is resistance to ampicillin and Unasyn, no simple, single oral antibiotic choice available at present. Currently I will continue IV Zosyn since the urine culture is also growing enterococcus until sensitivities are back Repeat cultures performed, so far negative. Patient had catheter leakage and bladder spasm, catheter was changed on 09/08/2017 again, following which symptoms are resolved. Highly appreciate all the urologist involved in patient's care.  Urologist does not anticipate any inpatient procedure.  Acute kidney injury on CKD stage III Function getting  better.  Continue close monitoring encourage p.o. Fluids. Renal function getting better after replacement of the Foley catheter.  Hypothyroidism -Continue Synthroid  BPH Contributing to current presentation -Continue finasteride and alfuzosin  Hypertension Uncontrolled. -Continue home amlodipine and labetalol -Hydralazine prn  History of dyslipidemia -Continue Pravastatin  Constipation and poor p.o. intake. Continue stool softeners.  Diet: car modified diet DVT Prophylaxis: Heparin  Advance goals of care discussion: DNR DNI  Family Communication: no family was present at bedside, at the time of interview.   Disposition:  Discharge to home,  PT consulted recommend no PT  Consultants: urology, infectious disease Procedures: none  Antibiotics: Anti-infectives (From admission, onward)   Start     Dose/Rate Route Frequency Ordered Stop   09/09/17 1800  piperacillin-tazobactam (ZOSYN) IVPB 3.375 g     3.375 g 12.5 mL/hr over 240 Minutes Intravenous Every 8 hours 09/09/17 1331     09/09/17 1300  cefTRIAXone (ROCEPHIN) 2 g in sodium chloride 0.9 % 100 mL IVPB  Status:  Discontinued     2 g 200 mL/hr over 30 Minutes Intravenous Every 24 hours 09/09/17 1146 09/09/17 1314   09/07/17 0000  piperacillin-tazobactam (ZOSYN) IVPB 3.375 g  Status:  Discontinued     3.375 g 12.5 mL/hr over 240 Minutes Intravenous Every 8 hours 09/06/17 2259 09/09/17 1146   09/06/17 1600  ceFEPIme (MAXIPIME) 2 g in sodium chloride 0.9 % 100 mL IVPB  Status:  Discontinued     2 g 200 mL/hr over 30 Minutes Intravenous  Once 09/06/17 1528 09/06/17 1539   09/06/17 1545  piperacillin-tazobactam (ZOSYN) IVPB 3.375 g     3.375 g 100 mL/hr over 30 Minutes Intravenous  Once 09/06/17 1543 09/06/17 1644       Objective:  Physical Exam: Vitals:   09/09/17 2230 09/10/17 0002 09/10/17 0407 09/10/17 1330  BP:   (!) 161/82 (!) 146/73  Pulse:   67 61  Resp:   20 18  Temp: 99.5 F (37.5 C) 99.1 F (37.3  C) 99.3 F (37.4 C) 98.6 F (37 C)  TempSrc: Oral Oral Oral Oral  SpO2:   98% 95%  Weight:      Height:        Intake/Output Summary (Last 24 hours) at 09/10/2017 1618 Last data filed at 09/10/2017 0900 Gross per 24 hour  Intake 508.54 ml  Output 2050 ml  Net -1541.46 ml   Filed Weights   09/06/17 1252 09/06/17 2038  Weight: 83.9 kg (185 lb) 86.1 kg (189 lb 12.8 oz)   General: Alert, Awake and Oriented to Time, Place and Person. Appear in mild distress, affect appropriate Eyes: PERRL, Conjunctiva normal ENT: Oral Mucosa clear moist. Neck: no JVD, no Abnormal Mass Or lumps Cardiovascular: S1 and S2 Present, no Murmur, Peripheral Pulses Present Respiratory: normal respiratory effort, Bilateral Air entry equal and Decreased, no use of accessory muscle, Clear to Auscultation, no Crackles, no wheezes Abdomen: Bowel Sound present, Soft and no tenderness, no hernia Skin: no redness, no Rash, no induration Extremities: no Pedal edema, no calf tenderness Neurologic: Grossly no focal neuro deficit. Bilaterally Equal motor strength  Data Reviewed: CBC: Recent Labs  Lab 09/06/17 1327 09/07/17 0422 09/08/17 0542 09/09/17 0831  WBC 15.2* 16.6* 16.5* 15.6*  NEUTROABS 13.2*  --  13.7* 12.2*  HGB 11.7* 9.5* 9.4* 9.5*  HCT 36.4* 29.9* 29.0* 29.8*  MCV 90.8 88.2 87.6 88.2  PLT 196 238 239 732   Basic Metabolic Panel: Recent Labs  Lab 09/06/17 1327 09/07/17 0422 09/08/17 0542 09/09/17 0831 09/10/17 0416  NA 138 140 139 137 138  K 4.9 3.8 4.2 3.8 4.0  CL 109 109 110 108 107  CO2 20* 23 17* 20* 23  GLUCOSE 115* 122* 108* 112* 87  BUN 41* 43* 36* 30* 28*  CREATININE 3.01* 3.04* 2.87* 2.74* 2.70*  CALCIUM 8.6* 8.2* 8.3* 8.3* 8.3*  PHOS  --   --   --  3.6 4.3    Liver Function Tests: Recent Labs  Lab 09/06/17 1327 09/07/17 0422 09/08/17 0542 09/09/17 0831 09/10/17 0416  AST 17 15 15   --   --   ALT 12 15 18   --   --   ALKPHOS 77 76 77  --   --   BILITOT 1.0 0.6 0.7   --   --   PROT 6.5 5.5* 5.7*  --   --   ALBUMIN 2.9* 2.3* 2.3* 2.2* 2.1*   No results for input(s): LIPASE, AMYLASE in the last 168 hours. No results for input(s): AMMONIA in the last 168 hours. Coagulation Profile: No results for input(s): INR, PROTIME in the last 168 hours. Cardiac Enzymes: No results for input(s): CKTOTAL, CKMB, CKMBINDEX, TROPONINI in the last 168 hours. BNP (last 3 results) No results for input(s): PROBNP in the last 8760 hours. CBG: No results for input(s): GLUCAP in the last 168 hours. Studies: No results found.  Scheduled Meds: . alfuzosin  10 mg Oral Q breakfast  . amLODipine  2.5 mg Oral Daily  . cholecalciferol  2,000 Units Oral Daily  . finasteride  5 mg Oral Daily  . heparin injection (subcutaneous)  5,000 Units Subcutaneous Q8H  . labetalol  300 mg Oral BID  . levothyroxine  88 mcg Oral QAC breakfast  .  polyethylene glycol  17 g Oral Daily  . pravastatin  20 mg Oral Daily  . senna-docusate  1 tablet Oral BID   Continuous Infusions: . piperacillin-tazobactam (ZOSYN)  IV 3.375 g (09/10/17 0900)   PRN Meds: acetaminophen **OR** acetaminophen, hydrALAZINE, menthol-cetylpyridinium, oxybutynin, traMADol  Time spent: 35 minutes  Author: Berle Mull, MD [facility] Pager: [phone] 09/10/2017 4:18 PM  If 7PM-7AM, please contact night-coverage at [email or web address], password [facility]

## 2017-09-11 LAB — RENAL FUNCTION PANEL
Albumin: 2.2 g/dL — ABNORMAL LOW (ref 3.5–5.0)
Anion gap: 9 (ref 5–15)
BUN: 27 mg/dL — AB (ref 8–23)
CHLORIDE: 109 mmol/L (ref 98–111)
CO2: 22 mmol/L (ref 22–32)
Calcium: 8.4 mg/dL — ABNORMAL LOW (ref 8.9–10.3)
Creatinine, Ser: 2.7 mg/dL — ABNORMAL HIGH (ref 0.61–1.24)
GFR calc Af Amer: 24 mL/min — ABNORMAL LOW (ref >60)
GFR calc non Af Amer: 21 mL/min — ABNORMAL LOW (ref >60)
Glucose, Bld: 93 mg/dL (ref 70–99)
Phosphorus: 4.3 mg/dL (ref 2.5–4.6)
Potassium: 4.1 mmol/L (ref 3.5–5.1)
Sodium: 140 mmol/L (ref 135–145)

## 2017-09-11 LAB — CULTURE, BLOOD (ROUTINE X 2): CULTURE: NO GROWTH

## 2017-09-11 MED ORDER — NEPRO/CARBSTEADY PO LIQD
237.0000 mL | Freq: Three times a day (TID) | ORAL | Status: DC
  Administered 2017-09-11 – 2017-09-20 (×27): 237 mL via ORAL
  Filled 2017-09-11 (×31): qty 237

## 2017-09-11 MED ORDER — ADULT MULTIVITAMIN W/MINERALS CH
1.0000 | ORAL_TABLET | Freq: Every day | ORAL | Status: DC
  Administered 2017-09-11 – 2017-09-20 (×10): 1 via ORAL
  Filled 2017-09-11 (×10): qty 1

## 2017-09-11 NOTE — Progress Notes (Signed)
[facility] Progress Note  Patient: Kenneth Williams PVX:[id number]   PCP: Cassandria Anger, MD DOB: 11-Jun-1935   DOA: 09/06/2017   DOS: 09/11/2017   Date of Service: the patient was seen and examined on 09/11/2017  Subjective: No nausea no vomiting no abdominal pain.  Had some loose BM last night.  No blood in the stool.  No blood in the urine. . Brief hospital course: Pt. with PMH of BPH, hypertension, TIA, CKD stage III; admitted on 09/06/2017, presented with complaint of hematuria, was found to have sepsis secondary to E. coli UTI. Currently further plan is for continue IV antibiotics.  Assessment and Plan: 1.  Sepsis secondary to E. coli bacteremia and E. coli and enterococcus UTI. Hematuria. Indwelling Foley catheter. Chronic urinary retention. Urology consulted. Urine culture performed in the clinic. Blood culture performed here is positive for E. coli. Discussed with urology, patient has history of Pseudomonas as well as emphysematous cystitis and therefore they want to continue broad-spectrum antibiotics. Urine culture performed in the clinic showed E. coli as well as enterococcus. Urine culture performed here in the hospital is showing E. coli and enterococcus. Blood cultures growing E. coli only mostly sensitive to all antibiotics. Difficult choices, enterococcus is resistant to levofloxacin and E. coli is resistance to ampicillin and Unasyn, no simple, single oral antibiotic choice available at present. Repeat cultures performed, so far negative. Patient had catheter leakage and bladder spasm, catheter was changed on 09/08/2017 again, following which symptoms are resolved. Highly appreciate all the urologist involved in patient's care.  Urologist does not anticipate any inpatient procedure. Appreciate ID input as well.  Currently on IV Zosyn.  Acute kidney injury on CKD stage III Function getting better.  Continue close monitoring encourage p.o. Fluids. Renal function  getting better after replacement of the Foley catheter. Encouraging p.o. fluids.  Hypothyroidism -Continue Synthroid  BPH Contributing to current presentation -Continue finasteride and alfuzosin  Hypertension Uncontrolled. -Continue home amlodipine and labetalol -Hydralazine prn  History of dyslipidemia -Continue Pravastatin  Constipation and poor p.o. intake. Continue stool softeners.  Diet: car modified diet DVT Prophylaxis: Heparin  Advance goals of care discussion: DNR DNI  Family Communication: no family was present at bedside, at the time of interview.   Disposition:  Discharge to home,  PT consulted recommend no PT  Consultants: urology, infectious disease Procedures: none  Antibiotics: Anti-infectives (From admission, onward)   Start     Dose/Rate Route Frequency Ordered Stop   09/09/17 1800  piperacillin-tazobactam (ZOSYN) IVPB 3.375 g     3.375 g 12.5 mL/hr over 240 Minutes Intravenous Every 8 hours 09/09/17 1331     09/09/17 1300  cefTRIAXone (ROCEPHIN) 2 g in sodium chloride 0.9 % 100 mL IVPB  Status:  Discontinued     2 g 200 mL/hr over 30 Minutes Intravenous Every 24 hours 09/09/17 1146 09/09/17 1314   09/07/17 0000  piperacillin-tazobactam (ZOSYN) IVPB 3.375 g  Status:  Discontinued     3.375 g 12.5 mL/hr over 240 Minutes Intravenous Every 8 hours 09/06/17 2259 09/09/17 1146   09/06/17 1600  ceFEPIme (MAXIPIME) 2 g in sodium chloride 0.9 % 100 mL IVPB  Status:  Discontinued     2 g 200 mL/hr over 30 Minutes Intravenous  Once 09/06/17 1528 09/06/17 1539   09/06/17 1545  piperacillin-tazobactam (ZOSYN) IVPB 3.375 g     3.375 g 100 mL/hr over 30 Minutes Intravenous  Once 09/06/17 1543 09/06/17 1644       Objective: Physical  Exam: Vitals:   09/10/17 1330 09/10/17 2019 09/11/17 0545 09/11/17 1347  BP: (!) 146/73 (!) 161/69 (!) 153/70 (!) 150/70  Pulse: 61 74 68 65  Resp: 18 16 16 18   Temp: 98.6 F (37 C) 97.6 F (36.4 C) 98.9 F (37.2 C)  98.7 F (37.1 C)  TempSrc: Oral Oral Oral Oral  SpO2: 95% 96% 98% 95%  Weight:      Height:        Intake/Output Summary (Last 24 hours) at 09/11/2017 1455 Last data filed at 09/11/2017 0600 Gross per 24 hour  Intake 325.83 ml  Output 2200 ml  Net -1874.17 ml   Filed Weights   09/06/17 1252 09/06/17 2038  Weight: 83.9 kg (185 lb) 86.1 kg (189 lb 12.8 oz)   General: Alert, Awake and Oriented to Time, Place and Person. Appear in mild distress, affect appropriate Eyes: PERRL, Conjunctiva normal ENT: Oral Mucosa clear moist. Neck: no JVD, no Abnormal Mass Or lumps Cardiovascular: S1 and S2 Present, no Murmur, Peripheral Pulses Present Respiratory: normal respiratory effort, Bilateral Air entry equal and Decreased, no use of accessory muscle, Clear to Auscultation, no Crackles, no wheezes Abdomen: Bowel Sound present, Soft and no tenderness, no hernia Skin: no redness, no Rash, no induration Extremities: no Pedal edema, no calf tenderness Neurologic: Grossly no focal neuro deficit. Bilaterally Equal motor strength  Data Reviewed: CBC: Recent Labs  Lab 09/06/17 1327 09/07/17 0422 09/08/17 0542 09/09/17 0831  WBC 15.2* 16.6* 16.5* 15.6*  NEUTROABS 13.2*  --  13.7* 12.2*  HGB 11.7* 9.5* 9.4* 9.5*  HCT 36.4* 29.9* 29.0* 29.8*  MCV 90.8 88.2 87.6 88.2  PLT 196 238 239 573   Basic Metabolic Panel: Recent Labs  Lab 09/07/17 0422 09/08/17 0542 09/09/17 0831 09/10/17 0416 09/11/17 0401  NA 140 139 137 138 140  K 3.8 4.2 3.8 4.0 4.1  CL 109 110 108 107 109  CO2 23 17* 20* 23 22  GLUCOSE 122* 108* 112* 87 93  BUN 43* 36* 30* 28* 27*  CREATININE 3.04* 2.87* 2.74* 2.70* 2.70*  CALCIUM 8.2* 8.3* 8.3* 8.3* 8.4*  PHOS  --   --  3.6 4.3 4.3    Liver Function Tests: Recent Labs  Lab 09/06/17 1327 09/07/17 0422 09/08/17 0542 09/09/17 0831 09/10/17 0416 09/11/17 0401  AST 17 15 15   --   --   --   ALT 12 15 18   --   --   --   ALKPHOS 77 76 77  --   --   --   BILITOT  1.0 0.6 0.7  --   --   --   PROT 6.5 5.5* 5.7*  --   --   --   ALBUMIN 2.9* 2.3* 2.3* 2.2* 2.1* 2.2*   No results for input(s): LIPASE, AMYLASE in the last 168 hours. No results for input(s): AMMONIA in the last 168 hours. Coagulation Profile: No results for input(s): INR, PROTIME in the last 168 hours. Cardiac Enzymes: No results for input(s): CKTOTAL, CKMB, CKMBINDEX, TROPONINI in the last 168 hours. BNP (last 3 results) No results for input(s): PROBNP in the last 8760 hours. CBG: No results for input(s): GLUCAP in the last 168 hours. Studies: No results found.  Scheduled Meds: . alfuzosin  10 mg Oral Q breakfast  . amLODipine  2.5 mg Oral Daily  . cholecalciferol  2,000 Units Oral Daily  . feeding supplement (NEPRO CARB STEADY)  237 mL Oral TID BM  . finasteride  5 mg  Oral Daily  . heparin injection (subcutaneous)  5,000 Units Subcutaneous Q8H  . labetalol  300 mg Oral BID  . levothyroxine  88 mcg Oral QAC breakfast  . multivitamin with minerals  1 tablet Oral Daily  . polyethylene glycol  17 g Oral Daily  . pravastatin  20 mg Oral Daily  . senna-docusate  1 tablet Oral BID   Continuous Infusions: . piperacillin-tazobactam (ZOSYN)  IV 3.375 g (09/11/17 0942)   PRN Meds: acetaminophen **OR** acetaminophen, hydrALAZINE, menthol-cetylpyridinium, oxybutynin, traMADol  Time spent: 35 minutes  Author: Berle Mull, MD [facility] Pager: [phone] 09/11/2017 2:55 PM  If 7PM-7AM, please contact night-coverage at [email or web address], password [facility]

## 2017-09-12 ENCOUNTER — Inpatient Hospital Stay (HOSPITAL_COMMUNITY): Payer: PPO

## 2017-09-12 DIAGNOSIS — N390 Urinary tract infection, site not specified: Secondary | ICD-10-CM

## 2017-09-12 DIAGNOSIS — N12 Tubulo-interstitial nephritis, not specified as acute or chronic: Secondary | ICD-10-CM

## 2017-09-12 LAB — RENAL FUNCTION PANEL
ANION GAP: 9 (ref 5–15)
Albumin: 2.2 g/dL — ABNORMAL LOW (ref 3.5–5.0)
BUN: 30 mg/dL — ABNORMAL HIGH (ref 8–23)
CALCIUM: 8.5 mg/dL — AB (ref 8.9–10.3)
CO2: 24 mmol/L (ref 22–32)
Chloride: 108 mmol/L (ref 98–111)
Creatinine, Ser: 2.82 mg/dL — ABNORMAL HIGH (ref 0.61–1.24)
GFR calc Af Amer: 23 mL/min — ABNORMAL LOW (ref >60)
GFR, EST NON AFRICAN AMERICAN: 20 mL/min — AB (ref >60)
GLUCOSE: 99 mg/dL (ref 70–99)
PHOSPHORUS: 4.1 mg/dL (ref 2.5–4.6)
Potassium: 3.9 mmol/L (ref 3.5–5.1)
SODIUM: 141 mmol/L (ref 135–145)

## 2017-09-12 LAB — TROPONIN I: Troponin I: <0.03 ng/mL (ref <0.03)

## 2017-09-12 LAB — D-DIMER, QUANTITATIVE: D-Dimer, Quant: 2.46 ug/mL-FEU — ABNORMAL HIGH (ref 0.00–0.50)

## 2017-09-12 MED ORDER — TECHNETIUM TO 99M ALBUMIN AGGREGATED
3.7900 | Freq: Once | INTRAVENOUS | Status: AC | PRN
  Administered 2017-09-12: 3.79 via INTRAVENOUS

## 2017-09-12 MED ORDER — TECHNETIUM TC 99M DIETHYLENETRIAME-PENTAACETIC ACID
26.5000 | Freq: Once | INTRAVENOUS | Status: AC | PRN
  Administered 2017-09-12: 26.5 via INTRAVENOUS

## 2017-09-12 MED ORDER — AMLODIPINE BESYLATE 5 MG PO TABS
5.0000 mg | ORAL_TABLET | Freq: Every day | ORAL | Status: DC
  Administered 2017-09-13 – 2017-09-20 (×8): 5 mg via ORAL
  Filled 2017-09-12 (×8): qty 1

## 2017-09-12 MED ORDER — OXYCODONE-ACETAMINOPHEN 5-325 MG PO TABS
1.0000 | ORAL_TABLET | Freq: Four times a day (QID) | ORAL | Status: DC | PRN
  Administered 2017-09-15 – 2017-09-16 (×2): 1 via ORAL
  Filled 2017-09-12 (×2): qty 1

## 2017-09-12 MED ORDER — AMLODIPINE BESYLATE 5 MG PO TABS
2.5000 mg | ORAL_TABLET | Freq: Once | ORAL | Status: AC
  Administered 2017-09-12: 2.5 mg via ORAL

## 2017-09-12 NOTE — Care Management Important Message (Signed)
Important Message  Patient Details  Name: Kenneth Williams MRN: [id number] Date of Birth: 12/21/1935   Medicare Important Message Given:  Yes    [name] 09/12/2017, 12:14 PMImportant Message  Patient Details  Name: Kenneth Williams MRN: [id number] Date of Birth: 10-Jan-1936   Medicare Important Message Given:  Yes    [name] 09/12/2017, 12:14 PM

## 2017-09-12 NOTE — Progress Notes (Signed)
INFECTIOUS DISEASE PROGRESS NOTE  ID: Kenneth Williams is a 82 y.o. male with  Active Problems:   Hypothyroidism   TIA (transient ischemic attack)   Carotid artery stenosis   BPH (benign prostatic hyperplasia)   UTI (urinary tract infection)  Subjective: Afebrile.  Multiple questions about his kidney, bladder, infection.  Abtx:  Anti-infectives (From admission, onward)   Start     Dose/Rate Route Frequency Ordered Stop   09/09/17 1800  piperacillin-tazobactam (ZOSYN) IVPB 3.375 g     3.375 g 12.5 mL/hr over 240 Minutes Intravenous Every 8 hours 09/09/17 1331     09/09/17 1300  cefTRIAXone (ROCEPHIN) 2 g in sodium chloride 0.9 % 100 mL IVPB  Status:  Discontinued     2 g 200 mL/hr over 30 Minutes Intravenous Every 24 hours 09/09/17 1146 09/09/17 1314   09/07/17 0000  piperacillin-tazobactam (ZOSYN) IVPB 3.375 g  Status:  Discontinued     3.375 g 12.5 mL/hr over 240 Minutes Intravenous Every 8 hours 09/06/17 2259 09/09/17 1146   09/06/17 1600  ceFEPIme (MAXIPIME) 2 g in sodium chloride 0.9 % 100 mL IVPB  Status:  Discontinued     2 g 200 mL/hr over 30 Minutes Intravenous  Once 09/06/17 1528 09/06/17 1539   09/06/17 1545  piperacillin-tazobactam (ZOSYN) IVPB 3.375 g     3.375 g 100 mL/hr over 30 Minutes Intravenous  Once 09/06/17 1543 09/06/17 1644      Medications:  Scheduled: . alfuzosin  10 mg Oral Q breakfast  . [START ON 09/13/2017] amLODipine  5 mg Oral Daily  . cholecalciferol  2,000 Units Oral Daily  . feeding supplement (NEPRO CARB STEADY)  237 mL Oral TID BM  . finasteride  5 mg Oral Daily  . heparin injection (subcutaneous)  5,000 Units Subcutaneous Q8H  . labetalol  300 mg Oral BID  . levothyroxine  88 mcg Oral QAC breakfast  . multivitamin with minerals  1 tablet Oral Daily  . polyethylene glycol  17 g Oral Daily  . pravastatin  20 mg Oral Daily  . senna-docusate  1 tablet Oral BID    Objective: Vital signs in last 24 hours: Temp:  [99.1 F (37.3  C)-99.5 F (37.5 C)] 99.1 F (37.3 C) (07/22 0459) Pulse Rate:  [64-71] 71 (07/22 0459) Resp:  [18-20] 18 (07/22 0459) BP: (159-160)/(64-80) 159/80 (07/22 0459) SpO2:  [94 %-99 %] 94 % (07/22 0459)   General appearance: alert, cooperative and no distress Resp: clear to auscultation bilaterally Cardio: irregularly irregular rhythm GI: normal findings: bowel sounds normal and soft, non-tender Extremities: edema none  Lab Results Recent Labs    09/11/17 0401 09/12/17 0431  NA 140 141  K 4.1 3.9  CL 109 108  CO2 22 24  BUN 27* 30*  CREATININE 2.70* 2.82*   Liver Panel Recent Labs    09/11/17 0401 09/12/17 0431  ALBUMIN 2.2* 2.2*   Sedimentation Rate No results for input(s): ESRSEDRATE in the last 72 hours. C-Reactive Protein No results for input(s): CRP in the last 72 hours.  Microbiology: Recent Results (from the past 240 hour(s))  Blood culture (routine x 2)     Status: Abnormal   Collection Time: 09/06/17  3:48 PM  Result Value Ref Range Status   Specimen Description   Final    BLOOD LEFT WRIST Performed at [facility], [place] [address] [place], [place] [address]    Special Requests   Final    BOTTLES DRAWN AEROBIC AND  ANAEROBIC Blood Culture results may not be optimal due to an inadequate volume of blood received in culture bottles Performed at [facility], [place] [address], [place], [place] [address]    Culture  Setup Time   Final    GRAM NEGATIVE RODS IN BOTH AEROBIC AND ANAEROBIC BOTTLES CRITICAL RESULT CALLED TO, READ BACK BY AND VERIFIED WITH: Damian Leavell [id number] MLM Performed at [facility], [address], [place], [place] [address]    Culture ESCHERICHIA COLI (A)  Final   Report Status 09/09/2017 FINAL  Final   Organism ID, Bacteria ESCHERICHIA COLI  Final      Susceptibility   Escherichia coli - MIC*    AMPICILLIN >=32 RESISTANT Resistant     CEFAZOLIN <=4 SENSITIVE Sensitive     CEFEPIME  <=1 SENSITIVE Sensitive     CEFTAZIDIME <=1 SENSITIVE Sensitive     CEFTRIAXONE <=1 SENSITIVE Sensitive     CIPROFLOXACIN <=0.25 SENSITIVE Sensitive     GENTAMICIN <=1 SENSITIVE Sensitive     IMIPENEM <=0.25 SENSITIVE Sensitive     TRIMETH/SULFA <=20 SENSITIVE Sensitive     AMPICILLIN/SULBACTAM 16 INTERMEDIATE Intermediate     PIP/TAZO <=4 SENSITIVE Sensitive     Extended ESBL NEGATIVE Sensitive     * ESCHERICHIA COLI  Blood Culture ID Panel (Reflexed)     Status: Abnormal   Collection Time: 09/06/17  3:48 PM  Result Value Ref Range Status   Enterococcus species NOT DETECTED NOT DETECTED Final   Listeria monocytogenes NOT DETECTED NOT DETECTED Final   Staphylococcus species NOT DETECTED NOT DETECTED Final   Staphylococcus aureus NOT DETECTED NOT DETECTED Final   Streptococcus species NOT DETECTED NOT DETECTED Final   Streptococcus agalactiae NOT DETECTED NOT DETECTED Final   Streptococcus pneumoniae NOT DETECTED NOT DETECTED Final   Streptococcus pyogenes NOT DETECTED NOT DETECTED Final   Acinetobacter baumannii NOT DETECTED NOT DETECTED Final   Enterobacteriaceae species DETECTED (A) NOT DETECTED Final    Comment: Enterobacteriaceae represent a large family of gram-negative bacteria, not a single organism. CRITICAL RESULT CALLED TO, READ BACK BY AND VERIFIED WITH: PHARMD J GADHIA [id number] MLM    Enterobacter cloacae complex NOT DETECTED NOT DETECTED Final   Escherichia coli DETECTED (A) NOT DETECTED Final    Comment: CRITICAL RESULT CALLED TO, READ BACK BY AND VERIFIED WITH: PHARMD J GADHIA [id number] MLM    Klebsiella oxytoca NOT DETECTED NOT DETECTED Final   Klebsiella pneumoniae NOT DETECTED NOT DETECTED Final   Proteus species NOT DETECTED NOT DETECTED Final   Serratia marcescens NOT DETECTED NOT DETECTED Final   Carbapenem resistance NOT DETECTED NOT DETECTED Final   Haemophilus influenzae NOT DETECTED NOT DETECTED Final   Neisseria meningitidis NOT DETECTED NOT  DETECTED Final   Pseudomonas aeruginosa NOT DETECTED NOT DETECTED Final   Candida albicans NOT DETECTED NOT DETECTED Final   Candida glabrata NOT DETECTED NOT DETECTED Final   Candida krusei NOT DETECTED NOT DETECTED Final   Candida parapsilosis NOT DETECTED NOT DETECTED Final   Candida tropicalis NOT DETECTED NOT DETECTED Final    Comment: Performed at [facility], [place] [address], [place], [place] [address]  Blood culture (routine x 2)     Status: None   Collection Time: 09/06/17  3:53 PM  Result Value Ref Range Status   Specimen Description   Final    BLOOD RIGHT WRIST Performed at [facility] [address] [place], [place] [address]  Special Requests   Final    BOTTLES DRAWN AEROBIC AND ANAEROBIC Blood Culture results may not be optimal due to an excessive volume of blood received in culture bottles Performed at [facility] [address] [place], [place] [address]    Culture   Final    NO GROWTH 5 DAYS Performed at [facility], [place] [address], [place], [place] [address]    Report Status 09/11/2017 FINAL  Final  Urine culture     Status: Abnormal   Collection Time: 09/06/17  7:24 PM  Result Value Ref Range Status   Specimen Description   Final    URINE, CLEAN CATCH Performed at [facility], [place] [address], [place], [place] [address]    Special Requests   Final    Normal Performed at [facility], [place] [address] [place], [place] [address]    Culture (A)  Final    60,000 COLONIES/mL ESCHERICHIA COLI 60,000 COLONIES/mL ENTEROCOCCUS FAECALIS    Report Status 09/10/2017 FINAL  Final   Organism ID, Bacteria ESCHERICHIA COLI (A)  Final   Organism ID, Bacteria ENTEROCOCCUS FAECALIS (A)  Final      Susceptibility   Escherichia coli - MIC*    AMPICILLIN >=32 RESISTANT Resistant     CEFAZOLIN <=4 SENSITIVE Sensitive     CEFTRIAXONE <=1 SENSITIVE Sensitive     CIPROFLOXACIN  <=0.25 SENSITIVE Sensitive     GENTAMICIN <=1 SENSITIVE Sensitive     IMIPENEM <=0.25 SENSITIVE Sensitive     NITROFURANTOIN <=16 SENSITIVE Sensitive     TRIMETH/SULFA <=20 SENSITIVE Sensitive     AMPICILLIN/SULBACTAM 16 INTERMEDIATE Intermediate     PIP/TAZO <=4 SENSITIVE Sensitive     Extended ESBL NEGATIVE Sensitive     * 60,000 COLONIES/mL ESCHERICHIA COLI   Enterococcus faecalis - MIC*    AMPICILLIN <=2 SENSITIVE Sensitive     LEVOFLOXACIN >=8 RESISTANT Resistant     NITROFURANTOIN <=16 SENSITIVE Sensitive     VANCOMYCIN 1 SENSITIVE Sensitive     * 60,000 COLONIES/mL ENTEROCOCCUS FAECALIS  Culture, blood (routine x 2)     Status: None (Preliminary result)   Collection Time: 09/08/17  5:42 AM  Result Value Ref Range Status   Specimen Description   Final    BLOOD RIGHT ANTECUBITAL Performed at [facility] [address], [place], [place] [address]    Special Requests   Final    BOTTLES DRAWN AEROBIC AND ANAEROBIC Blood Culture adequate volume Performed at [facility] [address] [place], [place] [address]    Culture   Final    NO GROWTH 4 DAYS Performed at [facility], [place] [address] [place], [place] [address]    Report Status PENDING  Incomplete  Culture, blood (routine x 2)     Status: None (Preliminary result)   Collection Time: 09/08/17  5:42 AM  Result Value Ref Range Status   Specimen Description   Final    BLOOD RIGHT HAND Performed at [facility] [address] [place] [address], [place], [place] [address]    Special Requests   Final    BOTTLES DRAWN AEROBIC AND ANAEROBIC Blood Culture adequate volume Performed at [facility] [address] [place], [place] [address]    Culture   Final    NO GROWTH 4 DAYS Performed at [facility], [place] [address], [place],  [address]    Report Status PENDING  Incomplete  Studies/Results: Dg Chest 2 View  Result Date:  09/12/2017 CLINICAL DATA:  Medial chest pain.  Shortness of breath. EXAM: CHEST - 2 VIEW COMPARISON:  Chest x-rays dated 02/06/2016 and 09/15/2015. FINDINGS: Today's study is slightly hypoinspiratory with crowding of the perihilar and bibasilar bronchovascular markings. New subtle opacity is seen within the periphery of the RIGHT upper lung, suspicious for developing pneumonia. There is also a new dense opacity at the posterior costophrenic angle on the lateral projection, most likely LEFT lower lobe, consistent with additional pneumonia, atelectasis and/or small pleural effusion. Questionable small RIGHT pleural effusion. No pneumothorax or large pleural effusion seen. Heart size and mediastinal contours are likely stable given the low lung volumes. IMPRESSION: 1. Subtle opacity within the RIGHT upper lung, suspicious for early developing pneumonia. Recommend follow-up chest x-ray to ensure resolution. 2. Additional dense opacity at the posterior costophrenic angle on the lateral view, most likely LEFT lower lobe, consistent with pneumonia, atelectasis and/or small pleural effusion. 3. Questionable small RIGHT pleural effusion. Electronically Signed   By: Franki Cabot M.D.   On: 09/12/2017 10:35     Assessment/Plan: E coli bacteremia UTI (e coli R- amp, enterococcus- R- levaquin) Emphysematous pyelonephritis Prev pseudomonas UTI (S- zosyn) AKI on CKD   Total days of antibiotics: 7 zosyn  Would aim for 7 more days of zosyn (end 09-19-17).  Cr appears to have plateaued Repeat BCx 7-18 are ngtd.  He does not want PIC. His RN and I suggested midline. He will discuss with his daughter.  Available as needed.           Bobby Rumpf MD, FACP Infectious Diseases (pager) [phone] [email or web address] 09/12/2017, 4:10 PM  LOS: 6 days

## 2017-09-12 NOTE — Progress Notes (Signed)
[facility] Progress Note  Patient: Kenneth Williams OQH:[id number]   PCP: Cassandria Anger, MD DOB: 03/29/1935   DOA: 09/06/2017   DOS: 09/12/2017   Date of Service: the patient was seen and examined on 09/12/2017  Subjective: Patient comes of pleuritic chest pain located on the left side.  No nausea no vomiting.  No shortness of breath.  No complaints of heaviness of chest as well. No fever no chills noted overnight.  Brief hospital course: Pt. with PMH of BPH, hypertension, TIA, CKD stage III; admitted on 09/06/2017, presented with complaint of hematuria, was found to have sepsis secondary to E. coli UTI. Currently further plan is for continue IV antibiotics.  Assessment and Plan: 1.  Sepsis secondary to E. coli bacteremia and E. coli and enterococcus UTI. Hematuria. Indwelling Foley catheter. Chronic urinary retention. Urology consulted. Urine culture performed in the clinic. Blood culture performed here is positive for E. coli. Discussed with urology, patient has history of Pseudomonas as well as emphysematous cystitis and therefore they want to continue broad-spectrum antibiotics. Urine culture performed in the clinic showed E. coli as well as enterococcus. Urine culture performed here in the hospital is showing E. coli and enterococcus. Blood cultures growing E. coli only mostly sensitive to all antibiotics. Difficult choices, enterococcus is resistant to levofloxacin and E. coli is resistance to ampicillin and Unasyn, no simple, single oral antibiotic choice available at present. Repeat cultures performed, so far negative. Patient had catheter leakage and bladder spasm, catheter was changed on 09/08/2017 again, following which symptoms are resolved. Highly appreciate all the urologist involved in patient's care.  Urologist does not anticipate any inpatient procedure. Appreciate ID input as well.  Currently on IV Zosyn.  Chest pain. Reported some chest pain this morning.   EKG known ischemic. Troponins are negative so far. Monitor serial troponin. Continue monitoring on telemetry. D-dimer is mildly elevated and patient has been in the hospital and therefore we will check a VQ scan. Chest x-ray made a comment regarding possible bilateral pneumonia although patient is on antibiotic and I do not suspect that the patient actually has an active infection but patient may benefit from incentive spirometry. Pain medication change from tramadol to Percocet.  Acute kidney injury on CKD stage III Function getting better.  Continue close monitoring encourage p.o. Fluids. Renal function getting better after replacement of the Foley catheter. Encouraging p.o. fluids.  Hypothyroidism -Continue Synthroid  BPH Contributing to current presentation -Continue finasteride and alfuzosin  Hypertension Uncontrolled. -Continue home amlodipine and labetalol -Hydralazine prn  History of dyslipidemia -Continue Pravastatin  Constipation and poor p.o. intake. Continue stool softeners.  Diet: car modified diet DVT Prophylaxis: Heparin  Advance goals of care discussion: DNR DNI  Family Communication: no family was present at bedside, at the time of interview.   Disposition:  Discharge to home,  PT consulted recommend no PT  Consultants: urology, infectious disease Procedures: none  Antibiotics: Anti-infectives (From admission, onward)   Start     Dose/Rate Route Frequency Ordered Stop   09/09/17 1800  piperacillin-tazobactam (ZOSYN) IVPB 3.375 g     3.375 g 12.5 mL/hr over 240 Minutes Intravenous Every 8 hours 09/09/17 1331     09/09/17 1300  cefTRIAXone (ROCEPHIN) 2 g in sodium chloride 0.9 % 100 mL IVPB  Status:  Discontinued     2 g 200 mL/hr over 30 Minutes Intravenous Every 24 hours 09/09/17 1146 09/09/17 1314   09/07/17 0000  piperacillin-tazobactam (ZOSYN) IVPB 3.375 g  Status:  Discontinued     3.375 g 12.5 mL/hr over 240 Minutes Intravenous Every 8  hours 09/06/17 2259 09/09/17 1146   09/06/17 1600  ceFEPIme (MAXIPIME) 2 g in sodium chloride 0.9 % 100 mL IVPB  Status:  Discontinued     2 g 200 mL/hr over 30 Minutes Intravenous  Once 09/06/17 1528 09/06/17 1539   09/06/17 1545  piperacillin-tazobactam (ZOSYN) IVPB 3.375 g     3.375 g 100 mL/hr over 30 Minutes Intravenous  Once 09/06/17 1543 09/06/17 1644       Objective: Physical Exam: Vitals:   09/11/17 0545 09/11/17 1347 09/11/17 2014 09/12/17 0459  BP: (!) 153/70 (!) 150/70 (!) 160/64 (!) 159/80  Pulse: 68 65 64 71  Resp: 16 18 20 18   Temp: 98.9 F (37.2 C) 98.7 F (37.1 C) 99.5 F (37.5 C) 99.1 F (37.3 C)  TempSrc: Oral Oral Oral Oral  SpO2: 98% 95% 99% 94%  Weight:      Height:        Intake/Output Summary (Last 24 hours) at 09/12/2017 1515 Last data filed at 09/12/2017 1200 Gross per 24 hour  Intake 830.01 ml  Output 3625 ml  Net -2794.99 ml   Filed Weights   09/06/17 1252 09/06/17 2038  Weight: 83.9 kg (185 lb) 86.1 kg (189 lb 12.8 oz)   General: Alert, Awake and Oriented to Time, Place and Person. Appear in mild distress, affect appropriate Eyes: PERRL, Conjunctiva normal ENT: Oral Mucosa clear moist. Neck: no JVD, no Abnormal Mass Or lumps Cardiovascular: S1 and S2 Present, no Murmur, Peripheral Pulses Present Respiratory: normal respiratory effort, Bilateral Air entry equal and Decreased, no use of accessory muscle, Clear to Auscultation, no Crackles, no wheezes Abdomen: Bowel Sound present, Soft and no tenderness, no hernia Skin: no redness, no Rash, no induration Extremities: no Pedal edema, no calf tenderness Neurologic: Grossly no focal neuro deficit. Bilaterally Equal motor strength  Data Reviewed: CBC: Recent Labs  Lab 09/06/17 1327 09/07/17 0422 09/08/17 0542 09/09/17 0831  WBC 15.2* 16.6* 16.5* 15.6*  NEUTROABS 13.2*  --  13.7* 12.2*  HGB 11.7* 9.5* 9.4* 9.5*  HCT 36.4* 29.9* 29.0* 29.8*  MCV 90.8 88.2 87.6 88.2  PLT 196 238 239 294     Basic Metabolic Panel: Recent Labs  Lab 09/08/17 0542 09/09/17 0831 09/10/17 0416 09/11/17 0401 09/12/17 0431  NA 139 137 138 140 141  K 4.2 3.8 4.0 4.1 3.9  CL 110 108 107 109 108  CO2 17* 20* 23 22 24   GLUCOSE 108* 112* 87 93 99  BUN 36* 30* 28* 27* 30*  CREATININE 2.87* 2.74* 2.70* 2.70* 2.82*  CALCIUM 8.3* 8.3* 8.3* 8.4* 8.5*  PHOS  --  3.6 4.3 4.3 4.1    Liver Function Tests: Recent Labs  Lab 09/06/17 1327 09/07/17 0422 09/08/17 0542 09/09/17 0831 09/10/17 0416 09/11/17 0401 09/12/17 0431  AST 17 15 15   --   --   --   --   ALT 12 15 18   --   --   --   --   ALKPHOS 77 76 77  --   --   --   --   BILITOT 1.0 0.6 0.7  --   --   --   --   PROT 6.5 5.5* 5.7*  --   --   --   --   ALBUMIN 2.9* 2.3* 2.3* 2.2* 2.1* 2.2* 2.2*   No results for input(s): LIPASE, AMYLASE in the last 168 hours. No  results for input(s): AMMONIA in the last 168 hours. Coagulation Profile: No results for input(s): INR, PROTIME in the last 168 hours. Cardiac Enzymes: Recent Labs  Lab 09/12/17 0949  TROPONINI <0.03   BNP (last 3 results) No results for input(s): PROBNP in the last 8760 hours. CBG: No results for input(s): GLUCAP in the last 168 hours. Studies: Dg Chest 2 View  Result Date: 09/12/2017 CLINICAL DATA:  Medial chest pain.  Shortness of breath. EXAM: CHEST - 2 VIEW COMPARISON:  Chest x-rays dated 02/06/2016 and 09/15/2015. FINDINGS: Today's study is slightly hypoinspiratory with crowding of the perihilar and bibasilar bronchovascular markings. New subtle opacity is seen within the periphery of the RIGHT upper lung, suspicious for developing pneumonia. There is also a new dense opacity at the posterior costophrenic angle on the lateral projection, most likely LEFT lower lobe, consistent with additional pneumonia, atelectasis and/or small pleural effusion. Questionable small RIGHT pleural effusion. No pneumothorax or large pleural effusion seen. Heart size and mediastinal contours  are likely stable given the low lung volumes. IMPRESSION: 1. Subtle opacity within the RIGHT upper lung, suspicious for early developing pneumonia. Recommend follow-up chest x-ray to ensure resolution. 2. Additional dense opacity at the posterior costophrenic angle on the lateral view, most likely LEFT lower lobe, consistent with pneumonia, atelectasis and/or small pleural effusion. 3. Questionable small RIGHT pleural effusion. Electronically Signed   By: Franki Cabot M.D.   On: 09/12/2017 10:35    Scheduled Meds: . alfuzosin  10 mg Oral Q breakfast  . [START ON 09/13/2017] amLODipine  5 mg Oral Daily  . cholecalciferol  2,000 Units Oral Daily  . feeding supplement (NEPRO CARB STEADY)  237 mL Oral TID BM  . finasteride  5 mg Oral Daily  . heparin injection (subcutaneous)  5,000 Units Subcutaneous Q8H  . labetalol  300 mg Oral BID  . levothyroxine  88 mcg Oral QAC breakfast  . multivitamin with minerals  1 tablet Oral Daily  . polyethylene glycol  17 g Oral Daily  . pravastatin  20 mg Oral Daily  . senna-docusate  1 tablet Oral BID   Continuous Infusions: . piperacillin-tazobactam (ZOSYN)  IV 3.375 g (09/12/17 0921)   PRN Meds: acetaminophen **OR** acetaminophen, hydrALAZINE, menthol-cetylpyridinium, oxybutynin, traMADol  Time spent: 35 minutes  Author: Berle Mull, MD [facility] Pager: [phone] 09/12/2017 3:15 PM  If 7PM-7AM, please contact night-coverage at [email or web address], password [facility]

## 2017-09-12 NOTE — Progress Notes (Signed)
PT Cancellation Note  Patient Details Name: Kenneth Williams MRN: [id number] DOB: 1936/02/11   Cancelled Treatment:     Due to multiple diagnostic testing.   Rachel Bo, SPTA 09/12/2017, 2:26 PM

## 2017-09-13 LAB — RENAL FUNCTION PANEL
Albumin: 2.4 g/dL — ABNORMAL LOW (ref 3.5–5.0)
Anion gap: 12 (ref 5–15)
BUN: 31 mg/dL — ABNORMAL HIGH (ref 8–23)
CO2: 22 mmol/L (ref 22–32)
CREATININE: 2.72 mg/dL — AB (ref 0.61–1.24)
Calcium: 8.9 mg/dL (ref 8.9–10.3)
Chloride: 107 mmol/L (ref 98–111)
GFR, EST AFRICAN AMERICAN: 24 mL/min — AB (ref >60)
GFR, EST NON AFRICAN AMERICAN: 21 mL/min — AB (ref >60)
GLUCOSE: 91 mg/dL (ref 70–99)
PHOSPHORUS: 4.5 mg/dL (ref 2.5–4.6)
Potassium: 4 mmol/L (ref 3.5–5.1)
Sodium: 141 mmol/L (ref 135–145)

## 2017-09-13 LAB — CULTURE, BLOOD (ROUTINE X 2)
Culture: NO GROWTH
Culture: NO GROWTH
SPECIAL REQUESTS: ADEQUATE
SPECIAL REQUESTS: ADEQUATE

## 2017-09-13 LAB — PROTIME-INR
INR: 1.03
Prothrombin Time: 13.4 seconds (ref 11.4–15.2)

## 2017-09-13 MED ORDER — ISOSORBIDE MONONITRATE ER 30 MG PO TB24
30.0000 mg | ORAL_TABLET | Freq: Every day | ORAL | Status: DC
  Administered 2017-09-13 – 2017-09-20 (×8): 30 mg via ORAL
  Filled 2017-09-13 (×8): qty 1

## 2017-09-13 NOTE — Progress Notes (Signed)
Physical Therapy Treatment Patient Details Name: Kenneth Williams MRN: [id number] DOB: [birth year] Today's Date: 09/13/2017    History of Present Illness PMH of BPH, hypertension, TIA, CKD stage III; admitted on 09/06/2017, presented with complaint of hematuria, was found to have sepsis secondary to E. coli UTI.    PT Comments    Assisted patient with bed mobility, transfers, and ambulation. Patient able to sit up on EOB on own power without use of rails; assist needed for line management. Patient able to rise without assistance; attempted to ambulate w/o AD but staggered L/R with some LOB. Gave patient a single tip cane and continued ambulating to stairwell. Patient able to do 3 steps up/dn with L side rail and R side cane; patient continued to ambulate for 400 feet with cane without any LOB. RN notified of mobility status.   Follow Up Recommendations  No PT follow up     Equipment Recommendations  None recommended by PT    Recommendations for Other Services       Precautions / Restrictions Precautions Precautions: Fall Restrictions Weight Bearing Restrictions: No    Mobility  Bed Mobility Overal bed mobility: Needs Assistance Bed Mobility: Supine to Sit     Supine to sit: Supervision     General bed mobility comments: assist needed for line managment only  Transfers Overall transfer level: Needs assistance Equipment used: None Transfers: Sit to/from Stand Sit to Stand: Min guard            Ambulation/Gait Ambulation/Gait assistance: Min guard Gait Distance (Feet): 400 Feet Assistive device: Straight cane Gait Pattern/deviations: Step-through pattern;Staggering left;Staggering right;Wide base of support Gait velocity: slightly decreased   General Gait Details: intially tried w/o AD; pt staggered L/R with slight LOB; continued with single tip can   Stairs Stairs: Yes Stairs assistance: Min guard;+2 safety/equipment Stair Management: Step to pattern;With  cane;One rail Left Number of Stairs: 3 General stair comments: +2 needed for line management   Wheelchair Mobility    Modified Rankin (Stroke Patients Only)       Balance                                            Cognition Arousal/Alertness: Awake/alert Behavior During Therapy: WFL for tasks assessed/performed Overall Cognitive Status: Within Functional Limits for tasks assessed                                        Exercises      General Comments        Pertinent Vitals/Pain Pain Assessment: No/denies pain    Home Living                      Prior Function            PT Goals (current goals can now be found in the care plan section) Progress towards PT goals: Progressing toward goals    Frequency    Min 3X/week      PT Plan      Co-evaluation              AM-PAC PT "6 Clicks" Daily Activity  Outcome Measure  Difficulty turning over in bed (including adjusting bedclothes, sheets and blankets)?: A Little Difficulty moving from lying on back  to sitting on the side of the bed? : A Little Difficulty sitting down on and standing up from a chair with arms (e.g., wheelchair, bedside commode, etc,.)?: A Little Help needed moving to and from a bed to chair (including a wheelchair)?: A Little Help needed walking in hospital room?: A Little Help needed climbing 3-5 steps with a railing? : A Little 6 Click Score: 18    End of Session Equipment Utilized During Treatment: Gait belt Activity Tolerance: Patient tolerated treatment well Patient left: with call bell/phone within reach;with chair alarm set;in chair Nurse Communication: Mobility status PT Visit Diagnosis: Difficulty in walking, not elsewhere classified (R26.2)     Time: 4604-7998 PT Time Calculation (min) (ACUTE ONLY): 25 min  Charges:  $Gait Training: 8-22 mins $Therapeutic Activity: 8-22 mins                    G Codes:       Cherre Kothari> SPTA 09/13/2017, 12:52 PM

## 2017-09-13 NOTE — Progress Notes (Signed)
Pharmacy Antibiotic Note  Kenneth Williams is a 82 y.o. male admitted on 09/06/2017 with UTI and E.coli bacteremia. Patient has Hx pseudomonal UTI. 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  Pharmacy has been consulted for piperacillin/tazobactam dosing.  Today, 09/13/17  Today is day #7 of IV antibiotics  WBC 15.6 on 7/19  Afebrile   Plan:  Continue piperacillin/tazobactam 3.375 g IV q8h EI, plan is to continue through 09/19/17 pending line placement  Follow SCr closely for any renal adjustments needed  Follow cultures as needed   Height: 6\' 2"  (188 cm) Weight: 189 lb 12.8 oz (86.1 kg) IBW/kg (Calculated) : 82.2  Temp (24hrs), Avg:98.8 F (37.1 C), Min:98.6 F (37 C), Max:98.9 F (37.2 C)  Recent Labs  Lab 09/06/17 1327 09/06/17 1334 09/07/17 0422 09/08/17 0542 09/09/17 0831 09/10/17 0416 09/11/17 0401 09/12/17 0431 09/13/17 0432  WBC 15.2*  --  16.6* 16.5* 15.6*  --   --   --   --   CREATININE 3.01*  --  3.04* 2.87* 2.74* 2.70* 2.70* 2.82* 2.72*  LATICACIDVEN  --  1.35  --   --   --   --   --   --   --     Estimated Creatinine Clearance: 25.2 mL/min (A) (by C-G formula based on SCr of 2.72 mg/dL (H)).    Allergies  Allergen Reactions  . Lipitor [Atorvastatin]     diarrhea  . Spironolactone     gynecomastia  . Zocor [Simvastatin]     achy  . Coreg [Carvedilol] Diarrhea    Diarrhea and bradycardia  . Donepezil Hydrochloride Diarrhea    REACTION: diarrhea  . Hydrochlorothiazide Other (See Comments)    REACTION: Gout  . Razadyne [Galantamine Hydrobromide] Diarrhea    diarrhea    Dose adjustments this admission: n/a  Microbiology results: 7/16 BCx: E.coli, R amp, Int unasyn 7/16 UCx: 60K Ecoli and enterococcus faecalis (only levo resis) 7/18 repeat BCx: ngtd  Thank you for allowing pharmacy to be a part of this patient's care.   Royetta Asal, PharmD, BCPS Pager  [phone] 09/13/2017 11:08 AM

## 2017-09-13 NOTE — Progress Notes (Signed)
[facility] Progress Note  Patient: Kenneth Williams IRS:[id number]   PCP: Cassandria Anger, MD DOB: 04-27-1935   DOA: 09/06/2017   DOS: 09/13/2017   Date of Service: the patient was seen and examined on 09/13/2017  Subjective: Chest pain is better breathing is better.  No nausea no vomiting no fever no chills.  Appetite is improving.  No diarrhea.  Brief hospital course: Pt. with PMH of BPH, hypertension, TIA, CKD stage III; admitted on 09/06/2017, presented with complaint of hematuria, was found to have sepsis secondary to E. coli UTI. Currently further plan is for continue IV antibiotics.  Awaiting IR guided line placement  Assessment and Plan: 1.  Sepsis secondary to E. coli bacteremia and E. coli and enterococcus UTI. Hematuria. Indwelling Foley catheter. Chronic urinary retention. Urology consulted. Urine culture performed in the clinic. Blood culture performed here is positive for E. coli.  Repeat culture is negative. Urine culture performed in the clinic showed E. coli as well as unidentified organism. Urine culture performed here in the hospital is showing E. coli and enterococcus. Blood cultures growing E. coli only Difficult choices, enterococcus is resistant to levofloxacin and E. coli is resistance to ampicillin and Unasyn. no simple, single oral antibiotic choice available at present.  Patient had catheter leakage and bladder spasm, catheter was changed on 09/08/2017 again, following which symptoms are resolved. Highly appreciate all the urologist involved in patient's care.  Urologist does not anticipate any inpatient procedure. Patient will discharge home with Foley catheter in place. Appreciate ID input as well.  Currently on IV Zosyn. Plan is to complete a total of 14 days of treatment with IV Zosyn.  Completed 7 days and will need 7 more days of IV antibiotics. Due to his CKD status IR guided line recommended and consult placed.  We will keep the patient n.p.o.  after midnight also holding heparin.  Chest pain. Reported some chest pain this morning.  EKG known ischemic. Troponins are negative so far. Monitor serial troponin. Continue monitoring on telemetry.  Telemetry suggest SVT, continue beta-blocker. D-dimer is mildly elevated and patient has been in the hospital and therefore we will check a VQ scan. Chest x-ray made a comment regarding possible bilateral pneumonia although patient is on antibiotic and I do not suspect that the patient actually has an active infection but patient may benefit from incentive spirometry. Pain medication change from tramadol to Percocet. Currently pain-free.  Acute kidney injury on CKD stage III Function getting better.  Continue close monitoring encourage p.o. Fluids. Renal function getting better after replacement of the Foley catheter. Encouraging p.o. fluids. Discussed with nephrology, recommend PCP to refer outpatient for establishing care with nephrologist. Not a candidate for PICC line or midline.  Hypothyroidism -Continue Synthroid  BPH Contributing to current presentation -Continue finasteride and alfuzosin  Hypertension Uncontrolled. -Continue home amlodipine and labetalol -Hydralazine prn  History of dyslipidemia -Continue Pravastatin  Constipation and poor p.o. intake. Continue stool softeners.  Diet: car modified diet DVT Prophylaxis: Heparin  Advance goals of care discussion: DNR DNI  Family Communication: no family was present at bedside, at the time of interview.   Disposition:  Discharge to home,  PT consulted recommend no PT  Consultants: urology, infectious disease Procedures: none  Antibiotics: Anti-infectives (From admission, onward)   Start     Dose/Rate Route Frequency Ordered Stop   09/09/17 1800  piperacillin-tazobactam (ZOSYN) IVPB 3.375 g     3.375 g 12.5 mL/hr over 240 Minutes Intravenous Every 8 hours  09/09/17 1331     09/09/17 1300  cefTRIAXone  (ROCEPHIN) 2 g in sodium chloride 0.9 % 100 mL IVPB  Status:  Discontinued     2 g 200 mL/hr over 30 Minutes Intravenous Every 24 hours 09/09/17 1146 09/09/17 1314   09/07/17 0000  piperacillin-tazobactam (ZOSYN) IVPB 3.375 g  Status:  Discontinued     3.375 g 12.5 mL/hr over 240 Minutes Intravenous Every 8 hours 09/06/17 2259 09/09/17 1146   09/06/17 1600  ceFEPIme (MAXIPIME) 2 g in sodium chloride 0.9 % 100 mL IVPB  Status:  Discontinued     2 g 200 mL/hr over 30 Minutes Intravenous  Once 09/06/17 1528 09/06/17 1539   09/06/17 1545  piperacillin-tazobactam (ZOSYN) IVPB 3.375 g     3.375 g 100 mL/hr over 30 Minutes Intravenous  Once 09/06/17 1543 09/06/17 1644       Objective: Physical Exam: Vitals:   09/12/17 0459 09/12/17 2040 09/13/17 0609 09/13/17 1431  BP: (!) 159/80 (!) 149/66 (!) 162/76 (!) 150/64  Pulse: 71 63 64 64  Resp: 18 16 16 16   Temp: 99.1 F (37.3 C) 98.9 F (37.2 C) 98.6 F (37 C) 98 F (36.7 C)  TempSrc: Oral Oral Oral Oral  SpO2: 94% 94% 97% 98%  Weight:      Height:        Intake/Output Summary (Last 24 hours) at 09/13/2017 1741 Last data filed at 09/13/2017 1600 Gross per 24 hour  Intake 411.88 ml  Output 2425 ml  Net -2013.12 ml   Filed Weights   09/06/17 1252 09/06/17 2038  Weight: 83.9 kg (185 lb) 86.1 kg (189 lb 12.8 oz)   General: Alert, Awake and Oriented to Time, Place and Person. Appear in mild distress, affect appropriate Eyes: PERRL, Conjunctiva normal ENT: Oral Mucosa clear moist. Neck: no JVD, no Abnormal Mass Or lumps Cardiovascular: S1 and S2 Present, no Murmur, Peripheral Pulses Present Respiratory: normal respiratory effort, Bilateral Air entry equal and Decreased, no use of accessory muscle, Clear to Auscultation, no Crackles, no wheezes Abdomen: Bowel Sound present, Soft and no tenderness, no hernia Skin: no redness, no Rash, no induration Extremities: no Pedal edema, no calf tenderness Neurologic: Grossly no focal neuro  deficit. Bilaterally Equal motor strength  Data Reviewed: CBC: Recent Labs  Lab 09/07/17 0422 09/08/17 0542 09/09/17 0831  WBC 16.6* 16.5* 15.6*  NEUTROABS  --  13.7* 12.2*  HGB 9.5* 9.4* 9.5*  HCT 29.9* 29.0* 29.8*  MCV 88.2 87.6 88.2  PLT 238 239 098   Basic Metabolic Panel: Recent Labs  Lab 09/09/17 0831 09/10/17 0416 09/11/17 0401 09/12/17 0431 09/13/17 0432  NA 137 138 140 141 141  K 3.8 4.0 4.1 3.9 4.0  CL 108 107 109 108 107  CO2 20* 23 22 24 22   GLUCOSE 112* 87 93 99 91  BUN 30* 28* 27* 30* 31*  CREATININE 2.74* 2.70* 2.70* 2.82* 2.72*  CALCIUM 8.3* 8.3* 8.4* 8.5* 8.9  PHOS 3.6 4.3 4.3 4.1 4.5    Liver Function Tests: Recent Labs  Lab 09/07/17 0422 09/08/17 0542 09/09/17 0831 09/10/17 0416 09/11/17 0401 09/12/17 0431 09/13/17 0432  AST 15 15  --   --   --   --   --   ALT 15 18  --   --   --   --   --   ALKPHOS 76 77  --   --   --   --   --   BILITOT 0.6 0.7  --   --   --   --   --  PROT 5.5* 5.7*  --   --   --   --   --   ALBUMIN 2.3* 2.3* 2.2* 2.1* 2.2* 2.2* 2.4*   No results for input(s): LIPASE, AMYLASE in the last 168 hours. No results for input(s): AMMONIA in the last 168 hours. Coagulation Profile: No results for input(s): INR, PROTIME in the last 168 hours. Cardiac Enzymes: Recent Labs  Lab 09/12/17 0949 09/12/17 1657 09/12/17 2117  TROPONINI <0.03 <0.03 <0.03   BNP (last 3 results) No results for input(s): PROBNP in the last 8760 hours. CBG: No results for input(s): GLUCAP in the last 168 hours. Studies: No results found.  Scheduled Meds: . alfuzosin  10 mg Oral Q breakfast  . amLODipine  5 mg Oral Daily  . cholecalciferol  2,000 Units Oral Daily  . feeding supplement (NEPRO CARB STEADY)  237 mL Oral TID BM  . finasteride  5 mg Oral Daily  . isosorbide mononitrate  30 mg Oral Daily  . labetalol  300 mg Oral BID  . levothyroxine  88 mcg Oral QAC breakfast  . multivitamin with minerals  1 tablet Oral Daily  . polyethylene  glycol  17 g Oral Daily  . pravastatin  20 mg Oral Daily  . senna-docusate  1 tablet Oral BID   Continuous Infusions: . piperacillin-tazobactam (ZOSYN)  IV 3.375 g (09/13/17 1703)   PRN Meds: acetaminophen **OR** acetaminophen, hydrALAZINE, menthol-cetylpyridinium, oxybutynin, oxyCODONE-acetaminophen  Time spent: 35 minutes  Author: Berle Mull, MD [facility] Pager: [phone] 09/13/2017 5:41 PM  If 7PM-7AM, please contact night-coverage at [email or web address], password [facility]

## 2017-09-13 NOTE — Consult Note (Signed)
   [facility] CM Inpatient Consult   09/13/2017  RIGDON MACOMBER [birth year] [id number]    Patient screened for potential [facility] Care Management program due to 19% medium unplanned readmission score and hospital length of stay of 7 days.   Went to bedside to speak with Mr. Holleran about [place] Management services. He states "it will be fine if it is social call or social visit, otherwise, I don't really need it".   Confirms Primary Care Provider is Dr. Alain Marion [facility] listed as doing transition of care call) Denies having any concerns with transportation or with medications.  Made Mr. Poblete aware that he will receive post discharge General EMMI call. Advised that if there are any concerns noted on the call, [facility] will follow up with him. Mr. Khim expresses understanding.  Provided [facility] Care Management brochure with 24-hr nurse advice line magnet to contact should he need to.   Will make inpatient RNCM aware [place] Management services were declined at this time.   Marthenia Rolling, MSN-Ed, RN,BSN [facility] Liaison [phone]

## 2017-09-14 ENCOUNTER — Encounter (HOSPITAL_COMMUNITY): Payer: Self-pay | Admitting: Physician Assistant

## 2017-09-14 ENCOUNTER — Inpatient Hospital Stay (HOSPITAL_COMMUNITY): Payer: PPO

## 2017-09-14 DIAGNOSIS — N3001 Acute cystitis with hematuria: Secondary | ICD-10-CM

## 2017-09-14 HISTORY — PX: IR US GUIDE VASC ACCESS RIGHT: IMG2390

## 2017-09-14 HISTORY — PX: IR FLUORO GUIDE CV LINE RIGHT: IMG2283

## 2017-09-14 LAB — CBC WITH DIFFERENTIAL/PLATELET
BASOS PCT: 0 %
Basophils Absolute: 0.1 10*3/uL (ref 0.0–0.1)
EOS ABS: 0.5 10*3/uL (ref 0.0–0.7)
Eosinophils Relative: 3 %
HCT: 28.8 % — ABNORMAL LOW (ref 39.0–52.0)
Hemoglobin: 9.1 g/dL — ABNORMAL LOW (ref 13.0–17.0)
Lymphocytes Relative: 14 %
Lymphs Abs: 2.6 10*3/uL (ref 0.7–4.0)
MCH: 27.9 pg (ref 26.0–34.0)
MCHC: 31.6 g/dL (ref 30.0–36.0)
MCV: 88.3 fL (ref 78.0–100.0)
MONO ABS: 0.9 10*3/uL (ref 0.1–1.0)
MONOS PCT: 5 %
Neutro Abs: 15.1 10*3/uL (ref 1.7–7.7)
Neutrophils Relative %: 78 %
Platelets: 440 10*3/uL — ABNORMAL HIGH (ref 150–400)
RBC: 3.26 MIL/uL — ABNORMAL LOW (ref 4.22–5.81)
RDW: 13.9 % (ref 11.5–15.5)
WBC: 19.2 10*3/uL — ABNORMAL HIGH (ref 4.0–10.5)

## 2017-09-14 LAB — RENAL FUNCTION PANEL
Albumin: 2.2 g/dL — ABNORMAL LOW (ref 3.5–5.0)
Anion gap: 8 (ref 5–15)
BUN: 37 mg/dL — ABNORMAL HIGH (ref 8–23)
CALCIUM: 8.6 mg/dL — AB (ref 8.9–10.3)
CO2: 24 mmol/L (ref 22–32)
CREATININE: 2.78 mg/dL — AB (ref 0.61–1.24)
Chloride: 107 mmol/L (ref 98–111)
GFR calc Af Amer: 23 mL/min — ABNORMAL LOW (ref >60)
GFR calc non Af Amer: 20 mL/min — ABNORMAL LOW (ref >60)
GLUCOSE: 112 mg/dL — AB (ref 70–99)
Phosphorus: 4.3 mg/dL (ref 2.5–4.6)
Potassium: 4.1 mmol/L (ref 3.5–5.1)
SODIUM: 139 mmol/L (ref 135–145)

## 2017-09-14 MED ORDER — FENTANYL CITRATE (PF) 100 MCG/2ML IJ SOLN
INTRAMUSCULAR | Status: AC | PRN
  Administered 2017-09-14: 50 ug via INTRAVENOUS

## 2017-09-14 MED ORDER — MIDAZOLAM HCL 2 MG/2ML IJ SOLN
INTRAMUSCULAR | Status: AC
  Filled 2017-09-14: qty 2

## 2017-09-14 MED ORDER — LIDOCAINE HCL 1 % IJ SOLN
INTRAMUSCULAR | Status: AC
  Filled 2017-09-14: qty 20

## 2017-09-14 MED ORDER — SODIUM CHLORIDE 0.9 % IV SOLN
INTRAVENOUS | Status: DC
  Administered 2017-09-14: 13:00:00 via INTRAVENOUS

## 2017-09-14 MED ORDER — NALOXONE HCL 0.4 MG/ML IJ SOLN
INTRAMUSCULAR | Status: AC
  Filled 2017-09-14: qty 1

## 2017-09-14 MED ORDER — FENTANYL CITRATE (PF) 100 MCG/2ML IJ SOLN
INTRAMUSCULAR | Status: AC
  Filled 2017-09-14: qty 2

## 2017-09-14 MED ORDER — MIDAZOLAM HCL 2 MG/2ML IJ SOLN
INTRAMUSCULAR | Status: AC | PRN
  Administered 2017-09-14: 1 mg via INTRAVENOUS

## 2017-09-14 MED ORDER — FLUMAZENIL 0.5 MG/5ML IV SOLN
INTRAVENOUS | Status: AC
  Filled 2017-09-14: qty 5

## 2017-09-14 MED ORDER — LIDOCAINE HCL 1 % IJ SOLN
INTRAMUSCULAR | Status: AC | PRN
  Administered 2017-09-14: 10 mL

## 2017-09-14 NOTE — H&P (Signed)
Chief Complaint: Kenneth Williams Coli bacteremia  Referring Physician(s): Lavina Hamman  Supervising Physician: Aletta Edouard  Patient Status: [facility] - In-pt  History of Present Illness: Kenneth Williams is a 82 y.o. male who was admitted to [facility] for a one week history of gross hematuria.  He self catheterizes due to urinary retention.  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  WBC was 15.2 on admission.  Blood cultures were positive for E. Coli.  We are asked to place a tunneled central line for long term IV antibiotics.  He is NPO and requests sedation for the procedure.   Past Medical History:  Diagnosis Date  . BPH (benign prostatic hyperplasia)   . CAD (coronary artery disease)    cath 2011-mild non obstructive  . Elevated PSA    history of   . Gout    2009  . History of diverticulitis of colon   . History of pancreatitis   . History of TIAs   . Hyperlipidemia   . Hypertension   . Hypothyroidism   . Hypothyroidism   . Left ear hearing loss    wears right hearing aid  . Personal history of hyperthyroidism 2006   s/p 131I- Dr. Chalmers Cater, Hyperthyroid  . Renal insufficiency 2011  . TGA (transient global amnesia)     Past Surgical History:  Procedure Laterality Date  . CHOLECYSTECTOMY    . COLONOSCOPY    . STAPEDES SURGERY     LEFT EAR    Allergies: Lipitor [atorvastatin]; Spironolactone; Zocor [simvastatin]; Coreg [carvedilol]; Donepezil hydrochloride; Hydrochlorothiazide; and Razadyne [galantamine hydrobromide]  Medications: Prior to Admission medications   Medication Sig Start Date End Date Taking? Authorizing Provider  acetaminophen (TYLENOL) 500 MG tablet Take 500 mg by mouth 2 (two) times daily as needed for mild pain.   Yes [provider]  alfuzosin (UROXATRAL) 10 MG 24 hr tablet TAKE 1 TABLET (10 MG TOTAL) BY MOUTH DAILY WITH BREAKFAST. 06/06/17  Yes Plotnikov, Evie Lacks, MD  amLODipine (NORVASC)  2.5 MG tablet Take 1 tablet (2.5 mg total) by mouth daily. 12/02/16  Yes Plotnikov, Evie Lacks, MD  cholecalciferol (VITAMIN D) 1000 UNITS tablet Take 2,000 Units by mouth daily.     Yes [provider]  clotrimazole-betamethasone (LOTRISONE) cream Apply 1 application topically 2 (two) times daily. 04/01/17  Yes Plotnikov, Evie Lacks, MD  colchicine 0.6 MG tablet Take 1 tablet (0.6 mg total) by mouth 3 (three) times daily as needed. For gout 07/21/16  Yes Plotnikov, Evie Lacks, MD  Cyanocobalamin (VITAMIN B 12 PO) Take 1 tablet by mouth daily.     Yes [provider]  labetalol (NORMODYNE) 300 MG tablet Take 1 tablet (300 mg total) by mouth 2 (two) times daily. 08/17/17  Yes Plotnikov, Evie Lacks, MD  levothyroxine (SYNTHROID, LEVOTHROID) 88 MCG tablet TAKE 1 TABLET (88 MCG TOTAL) BY MOUTH DAILY. 05/06/17  Yes Plotnikov, Evie Lacks, MD  pravastatin (PRAVACHOL) 20 MG tablet Take 1 tablet (20 mg total) by mouth daily. 08/17/17  Yes Plotnikov, Evie Lacks, MD  finasteride (PROSCAR) 5 MG tablet TAKE 1 TABLET (5 MG TOTAL) BY MOUTH DAILY. 06/30/17 06/30/18  Plotnikov, Evie Lacks, MD  loperamide (IMODIUM A-D) 2 MG tablet Take 1 tablet (2 mg total) by mouth 4 (four) times daily as needed for diarrhea or loose stools. Patient not taking: Reported on 09/06/2017 04/04/15   Plotnikov, Evie Lacks, MD     Family History  Problem Relation  Age of Onset  . Heart disease Sister   . Dementia Mother   . Hypertension Other   . Colon cancer Neg Hx   . Stomach cancer Neg Hx   . Esophageal cancer Neg Hx   . Rectal cancer Neg Hx     Social History   Socioeconomic History  . Marital status: Married    Spouse name: Not on file  . Number of children: 3  . Years of education: Not on file  . Highest education level: Not on file  Occupational History  . Not on file  Social Needs  . Financial resource strain: Not on file  . Food insecurity:    Worry: Not on file    Inability: Not on file  . Transportation needs:     Medical: Not on file    Non-medical: Not on file  Tobacco Use  . Smoking status: Never Smoker  . Smokeless tobacco: Never Used  Substance and Sexual Activity  . Alcohol use: No  . Drug use: No  . Sexual activity: Yes  Lifestyle  . Physical activity:    Days per week: Not on file    Minutes per session: Not on file  . Stress: Not on file  Relationships  . Social connections:    Talks on phone: Not on file    Gets together: Not on file    Attends religious service: Not on file    Active member of club or organization: Not on file    Attends meetings of clubs or organizations: Not on file    Relationship status: Not on file  Other Topics Concern  . Not on file  Social History Narrative   Patient is right handed.   Patient drinks 1-2 cups caffeine daily.     Review of Systems: A 12 point ROS discussed and pertinent positives are indicated in the HPI above.  All other systems are negative.  Review of Systems  Vital Signs: BP (!) 130/59 (BP Location: Right Arm)   Pulse 62   Temp 98.7 F (37.1 C) (Oral)   Resp 14   Ht 6\' 2"  (1.88 m)   Wt 189 lb 12.8 oz (86.1 kg)   SpO2 96%   BMI 24.37 kg/m   Physical Exam  Constitutional: He is oriented to person, place, and time. He appears well-developed.  HENT:  Head: Normocephalic and atraumatic.  Eyes: EOM are normal.  Neck: Normal range of motion.  Cardiovascular: Normal rate and regular rhythm.  Pulmonary/Chest: Effort normal. No respiratory distress.  Abdominal: He exhibits no distension.  Musculoskeletal: Normal range of motion.  Neurological: He is alert and oriented to person, place, and time.  Skin: Skin is warm and dry.  Psychiatric: He has a normal mood and affect. His behavior is normal. Judgment and thought content normal.  Vitals reviewed.   Imaging: Dg Chest 2 View  Result Date: 09/12/2017 CLINICAL DATA:  Medial chest pain.  Shortness of breath. EXAM: CHEST - 2 VIEW COMPARISON:  Chest x-rays dated  02/06/2016 and 09/15/2015. FINDINGS: Today's study is slightly hypoinspiratory with crowding of the perihilar and bibasilar bronchovascular markings. New subtle opacity is seen within the periphery of the RIGHT upper lung, suspicious for developing pneumonia. There is also a new dense opacity at the posterior costophrenic angle on the lateral projection, most likely LEFT lower lobe, consistent with additional pneumonia, atelectasis and/or small pleural effusion. Questionable small RIGHT pleural effusion. No pneumothorax or large pleural effusion seen. Heart size and mediastinal contours  are likely stable given the low lung volumes. IMPRESSION: 1. Subtle opacity within the RIGHT upper lung, suspicious for early developing pneumonia. Recommend follow-up chest x-ray to ensure resolution. 2. Additional dense opacity at the posterior costophrenic angle on the lateral view, most likely LEFT lower lobe, consistent with pneumonia, atelectasis and/or small pleural effusion. 3. Questionable small RIGHT pleural effusion. Electronically Signed   By: Franki Cabot M.D.   On: 09/12/2017 10:35   Nm Pulmonary Perf And Vent  Result Date: 09/12/2017 CLINICAL DATA:  Left side chest pain. EXAM: NUCLEAR MEDICINE VENTILATION - PERFUSION LUNG SCAN TECHNIQUE: Ventilation images were obtained in multiple projections using inhaled aerosol Tc-83m DTPA. Perfusion images were obtained in multiple projections after intravenous injection of Tc-22m-MAA. RADIOPHARMACEUTICALS:  26.5 mCi of Tc-55m DTPA aerosol inhalation and 3.8 mCi Tc57m-MAA IV COMPARISON:  Chest x-ray earlier today FINDINGS: Ventilation: Patchy nonsegmental ventilation defects Perfusion: Patchy nonsegmental matched perfusion defects. IMPRESSION: Patchy nonsegmental matched VQ defects. No mismatched defects. Study is low probability for pulmonary embolus. Electronically Signed   By: Rolm Baptise M.D.   On: 09/12/2017 18:48    Labs:  CBC: Recent Labs    09/07/17 0422  09/08/17 0542 09/09/17 0831 09/14/17 0427  WBC 16.6* 16.5* 15.6* 19.2*  HGB 9.5* 9.4* 9.5* 9.1*  HCT 29.9* 29.0* 29.8* 28.8*  PLT 238 239 294 440*    COAGS: Recent Labs    09/13/17 1748  INR 1.03    BMP: Recent Labs    09/11/17 0401 09/12/17 0431 09/13/17 0432 09/14/17 0427  NA 140 141 141 139  K 4.1 3.9 4.0 4.1  CL 109 108 107 107  CO2 22 24 22 24   GLUCOSE 93 99 91 112*  BUN 27* 30* 31* 37*  CALCIUM 8.4* 8.5* 8.9 8.6*  CREATININE 2.70* 2.82* 2.72* 2.78*  GFRNONAA 21* 20* 21* 20*  GFRAA 24* 23* 24* 23*    LIVER FUNCTION TESTS: Recent Labs    08/29/17 1605 09/06/17 1327 09/07/17 0422 09/08/17 0542  09/11/17 0401 09/12/17 0431 09/13/17 0432 09/14/17 0427  BILITOT 0.4 1.0 0.6 0.7  --   --   --   --   --   AST 10 17 15 15   --   --   --   --   --   ALT 11 12 15 18   --   --   --   --   --   ALKPHOS 57 77 76 77  --   --   --   --   --   PROT 6.5 6.5 5.5* 5.7*  --   --   --   --   --   ALBUMIN 3.5 2.9* 2.3* 2.3*   < > 2.2* 2.2* 2.4* 2.2*   < > = values in this interval not displayed.    TUMOR MARKERS: No results for input(s): AFPTM, CEA, CA199, CHROMGRNA in the last 8760 hours.  Assessment and Plan:  E. Coli bacteremia.  Will proceed with placement of a tunneled central venous catheter today so he can be discharged and receive IV antibiotics at home.  Risks and benefits of image guided central venous catheter placement was discussed with the patient including, but not limited to bleeding, infection, pneumothorax, or fibrin sheath development and need for additional procedures.  All of the patient's questions were answered, patient is agreeable to proceed. Consent signed and in chart.  Thank you for this interesting consult.  I greatly enjoyed meeting [name] and look forward to  participating in their care.  A copy of this report was sent to the requesting provider on this date.  Electronically Signed: Murrell Redden, PA-C   09/14/2017, 9:50  AM      I spent a total of 20 Minutes  in face to face in clinical consultation, greater than 50% of which was counseling/coordinating care for tunneled central venous catheter.

## 2017-09-14 NOTE — Progress Notes (Signed)
Pt selected [facility] for [facility]. Pt made a request for [facility] Beady with [facility]. Referral given.

## 2017-09-14 NOTE — Progress Notes (Addendum)
Patient ID: Kenneth Williams, male   DOB: December 07, 1935, 82 y.o.   MRN: [id number]                                                                PROGRESS NOTE                                                                                                                                                                                                             Patient Demographics:    Kenneth Williams, is a 82 y.o. male, DOB - [birth year], CZY:[id number]  Admit date - 09/06/2017   Admitting Physician Mariel Aloe, MD  Outpatient Primary MD for the patient is Plotnikov, Evie Lacks, MD  LOS - 8  Outpatient Specialists:     Chief Complaint  Patient presents with  . Urinary Tract Infection       Brief Narrative    82 y.o. male with medical history significant of BPH, hypertension, TIA, CKD stage III.  Patient reports a one-week history of gross hematuria.  He has persistent symptoms of dysuria, hesitancy, frequency.  He self caths secondary to urinary retention.  He reports gross hematuria with clots that have improved over the past few days, although he has had associated fever, chills, muscle aches.  He stopped self cathing after he started having symptoms of hematuria.  He did not try anything else to help with his symptoms.  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  ED Course: Vitals: Afebrile, slightly hypertensive, normal pulse, normal respirations Labs: Glucose of 115, BUN of 41, creatinine of 3.01, WBC of 15.2K Imaging: None obtained.  Per discussion with the EDP and urologist on-call, evidence of emphysematous cystitis on outpatient CT scan Medications/Course: Zosyn given.  Blood cultures obtained prior to antibiotic therapy.  Urine cultures obtained after antibiotic therapy.     Subjective:    [name] today has been afebrile overnite. Awaiting IR line. So possibly can go home with iv abx.     No headache, No chest pain, No abdominal pain - No Nausea, No new weakness tingling or numbness, No Cough - SOB.    Assessment  & Plan :    Active Problems:   Hypothyroidism  TIA (transient ischemic attack)   Carotid artery stenosis   BPH (benign prostatic hyperplasia)   UTI (urinary tract infection)   Pyelonephritis   1.  Sepsis secondary to E. coli bacteremia and E. coli and enterococcus UTI. Hematuria. Indwelling Foley catheter. Chronic urinary retention. Urology consulted. Urine culture performed in the clinic. Blood culture performed here is positive for E. coli.  Repeat culture is negative. Urine culture performed in the clinic showed E. coli as well as unidentified organism. Urine culture performed here in the hospital is showing E. coli and enterococcus. Blood cultures growing E. coli only Difficult choices, enterococcus is resistant to levofloxacin and E. coli is resistance to ampicillin and Unasyn. no simple, single oral antibiotic choice available at present.  Patient had catheter leakage and bladder spasm, catheter was changed on 09/08/2017 again, following which symptoms are resolved. Highly appreciate all the urologist involved in patient's care.  Urologist does not anticipate any inpatient procedure. Patient will discharge home with Foley catheter in place. Appreciate ID input as well.  Currently on IV Zosyn. Plan is to complete a total of 14 days of treatment with IV Zosyn.  Completed 7 days and will need 7 more days of IV antibiotics. Due to his CKD status IR guided line recommended and consult placed.  We will keep the patient n.p.o. after midnight also holding heparin. Awaiting IR line today Will also reach out to urology regarding iv abx.   Leukocytosis Repeat cbc in am  Chest pain. Reported some chest pain this morning.  EKG known ischemic. Troponins are negative so far. Monitor serial troponin. Continue monitoring on telemetry.  Telemetry suggest  SVT, continue beta-blocker. D-dimer is mildly elevated and patient has been in the hospital and therefore we will check a VQ scan. Chest x-ray made a comment regarding possible bilateral pneumonia although patient is on antibiotic and I do not suspect that the patient actually has an active infection but patient may benefit from incentive spirometry. Pain medication change from tramadol to Percocet. Currently pain-free.  Acute kidney injury on CKD stage III Function getting better.  Continue close monitoring encourage p.o. Fluids. Renal function getting better after replacement of the Foley catheter. Encouraging p.o. fluids. Discussed with nephrology, recommend PCP to refer outpatient for establishing care with nephrologist. Not a candidate for PICC line or midline.  Hypothyroidism -Continue Synthroid  BPH Contributing to current presentation -Continue finasteride and alfuzosin  Hypertension Uncontrolled. -Continue home amlodipineand labetalol -Hydralazine prn  History of dyslipidemia -Continue Pravastatin  Constipation and poor p.o. intake. Continue stool softeners.  Diet: car modified diet DVT Prophylaxis: Heparin  Advance goals of care discussion: DNR DNI  Family Communication: no family was present at bedside, at the time of interview.   Disposition:  Discharge to home,  PT consulted recommend no PT  Consultants: urology, infectious disease Procedures: none     Lab Results  Component Value Date   PLT 294 09/09/2017     Anti-infectives (From admission, onward)   Start     Dose/Rate Route Frequency Ordered Stop   09/09/17 1800  piperacillin-tazobactam (ZOSYN) IVPB 3.375 g     3.375 g 12.5 mL/hr over 240 Minutes Intravenous Every 8 hours 09/09/17 1331     09/09/17 1300  cefTRIAXone (ROCEPHIN) 2 g in sodium chloride 0.9 % 100 mL IVPB  Status:  Discontinued     2 g 200 mL/hr over 30 Minutes Intravenous Every 24 hours 09/09/17 1146 09/09/17 1314    09/07/17 0000  piperacillin-tazobactam (ZOSYN) IVPB  3.375 g  Status:  Discontinued     3.375 g 12.5 mL/hr over 240 Minutes Intravenous Every 8 hours 09/06/17 2259 09/09/17 1146   09/06/17 1600  ceFEPIme (MAXIPIME) 2 g in sodium chloride 0.9 % 100 mL IVPB  Status:  Discontinued     2 g 200 mL/hr over 30 Minutes Intravenous  Once 09/06/17 1528 09/06/17 1539   09/06/17 1545  piperacillin-tazobactam (ZOSYN) IVPB 3.375 g     3.375 g 100 mL/hr over 30 Minutes Intravenous  Once 09/06/17 1543 09/06/17 1644        Objective:   Vitals:   09/13/17 0609 09/13/17 1431 09/13/17 2100 09/14/17 0444  BP: (!) 162/76 (!) 150/64 132/75 (!) 130/59  Pulse: 64 64 74 62  Resp: 16 16 18 14   Temp: 98.6 F (37 C) 98 F (36.7 C) 98.1 F (36.7 C) 98.7 F (37.1 C)  TempSrc: Oral Oral Oral Oral  SpO2: 97% 98% 97% 96%  Weight:      Height:        Wt Readings from Last 3 Encounters:  09/06/17 86.1 kg (189 lb 12.8 oz)  08/17/17 86.2 kg (190 lb)  06/29/17 84.8 kg (187 lb)     Intake/Output Summary (Last 24 hours) at 09/14/2017 0550 Last data filed at 09/14/2017 0233 Gross per 24 hour  Intake 352.5 ml  Output 1675 ml  Net -1322.5 ml     Physical Exam  Awake Alert, Oriented X 3, No new F.N deficits, Normal affect [place].AT,PERRAL Supple Neck,No JVD, No cervical lymphadenopathy appriciated.  Symmetrical Chest wall movement, Good air movement bilaterally, CTAB RRR,No Gallops,Rubs or new Murmurs, No Parasternal Heave +ve B.Sounds, Abd Soft, No tenderness, No organomegaly appriciated, No rebound - guarding or rigidity. No Cyanosis, Clubbing or edema, No new Rash or bruise   Foley in place    Data Review:    CBC Recent Labs  Lab 09/08/17 0542 09/09/17 0831  WBC 16.5* 15.6*  HGB 9.4* 9.5*  HCT 29.0* 29.8*  PLT 239 294  MCV 87.6 88.2  MCH 28.4 28.1  MCHC 32.4 31.9  RDW 13.5 13.7  LYMPHSABS 1.0 1.9  MONOABS 1.5* 0.9  EOSABS 0.3 0.6  BASOSABS 0.0 0.0    Chemistries  Recent Labs  Lab  09/08/17 0542  09/10/17 0416 09/11/17 0401 09/12/17 0431 09/13/17 0432 09/14/17 0427  NA 139   < > 138 140 141 141 139  K 4.2   < > 4.0 4.1 3.9 4.0 4.1  CL 110   < > 107 109 108 107 107  CO2 17*   < > 23 22 24 22 24   GLUCOSE 108*   < > 87 93 99 91 112*  BUN 36*   < > 28* 27* 30* 31* 37*  CREATININE 2.87*   < > 2.70* 2.70* 2.82* 2.72* 2.78*  CALCIUM 8.3*   < > 8.3* 8.4* 8.5* 8.9 8.6*  AST 15  --   --   --   --   --   --   ALT 18  --   --   --   --   --   --   ALKPHOS 77  --   --   --   --   --   --   BILITOT 0.7  --   --   --   --   --   --    < > = values in this interval not displayed.   ------------------------------------------------------------------------------------------------------------------ No results for input(s): CHOL,  HDL, LDLCALC, TRIG, CHOLHDL, LDLDIRECT in the last 72 hours.  No results found for: HGBA1C ------------------------------------------------------------------------------------------------------------------ No results for input(s): TSH, T4TOTAL, T3FREE, THYROIDAB in the last 72 hours.  Invalid input(s): FREET3 ------------------------------------------------------------------------------------------------------------------ No results for input(s): VITAMINB12, FOLATE, FERRITIN, TIBC, IRON, RETICCTPCT in the last 72 hours.  Coagulation profile Recent Labs  Lab 09/13/17 1748  INR 1.03    Recent Labs    09/12/17 0949  DDIMER 2.46*    Cardiac Enzymes Recent Labs  Lab 09/12/17 0949 09/12/17 1657 09/12/17 2117  TROPONINI <0.03 <0.03 <0.03   ------------------------------------------------------------------------------------------------------------------ No results found for: BNP  Inpatient Medications  Scheduled Meds: . alfuzosin  10 mg Oral Q breakfast  . amLODipine  5 mg Oral Daily  . cholecalciferol  2,000 Units Oral Daily  . feeding supplement (NEPRO CARB STEADY)  237 mL Oral TID BM  . finasteride  5 mg Oral Daily  . isosorbide  mononitrate  30 mg Oral Daily  . labetalol  300 mg Oral BID  . levothyroxine  88 mcg Oral QAC breakfast  . multivitamin with minerals  1 tablet Oral Daily  . polyethylene glycol  17 g Oral Daily  . pravastatin  20 mg Oral Daily  . senna-docusate  1 tablet Oral BID   Continuous Infusions: . piperacillin-tazobactam (ZOSYN)  IV 3.375 g (09/14/17 0202)   PRN Meds:.acetaminophen **OR** acetaminophen, hydrALAZINE, menthol-cetylpyridinium, oxybutynin, oxyCODONE-acetaminophen  Micro Results Recent Results (from the past 240 hour(s))  Blood culture (routine x 2)     Status: Abnormal   Collection Time: 09/06/17  3:48 PM  Result Value Ref Range Status   Specimen Description   Final    BLOOD LEFT WRIST Performed at [facility] [address] [place], [place] [address]    Special Requests   Final    BOTTLES DRAWN AEROBIC AND ANAEROBIC Blood Culture results may not be optimal due to an inadequate volume of blood received in culture bottles Performed at [facility] [address] [place], [place] [address]    Culture  Setup Time   Final    GRAM NEGATIVE RODS IN BOTH AEROBIC AND ANAEROBIC BOTTLES CRITICAL RESULT CALLED TO, READ BACK BY AND VERIFIED WITH: Damian Leavell [id number] MLM Performed at [facility], [address], [place], [place] [address]    Culture ESCHERICHIA COLI (A)  Final   Report Status 09/09/2017 FINAL  Final   Organism ID, Bacteria ESCHERICHIA COLI  Final      Susceptibility   Escherichia coli - MIC*    AMPICILLIN >=32 RESISTANT Resistant     CEFAZOLIN <=4 SENSITIVE Sensitive     CEFEPIME <=1 SENSITIVE Sensitive     CEFTAZIDIME <=1 SENSITIVE Sensitive     CEFTRIAXONE <=1 SENSITIVE Sensitive     CIPROFLOXACIN <=0.25 SENSITIVE Sensitive     GENTAMICIN <=1 SENSITIVE Sensitive     IMIPENEM <=0.25 SENSITIVE Sensitive     TRIMETH/SULFA <=20 SENSITIVE Sensitive     AMPICILLIN/SULBACTAM 16 INTERMEDIATE Intermediate      PIP/TAZO <=4 SENSITIVE Sensitive     Extended ESBL NEGATIVE Sensitive     * ESCHERICHIA COLI  Blood Culture ID Panel (Reflexed)     Status: Abnormal   Collection Time: 09/06/17  3:48 PM  Result Value Ref Range Status   Enterococcus species NOT DETECTED NOT DETECTED Final   Listeria monocytogenes NOT DETECTED NOT DETECTED Final   Staphylococcus species NOT DETECTED NOT DETECTED Final   Staphylococcus aureus NOT DETECTED NOT  DETECTED Final   Streptococcus species NOT DETECTED NOT DETECTED Final   Streptococcus agalactiae NOT DETECTED NOT DETECTED Final   Streptococcus pneumoniae NOT DETECTED NOT DETECTED Final   Streptococcus pyogenes NOT DETECTED NOT DETECTED Final   Acinetobacter baumannii NOT DETECTED NOT DETECTED Final   Enterobacteriaceae species DETECTED (A) NOT DETECTED Final    Comment: Enterobacteriaceae represent a large family of gram-negative bacteria, not a single organism. CRITICAL RESULT CALLED TO, READ BACK BY AND VERIFIED WITH: PHARMD J GADHIA [id number] 0921 MLM    Enterobacter cloacae complex NOT DETECTED NOT DETECTED Final   Escherichia coli DETECTED (A) NOT DETECTED Final    Comment: CRITICAL RESULT CALLED TO, READ BACK BY AND VERIFIED WITH: PHARMD J GADHIA [id number] 0921 MLM    Klebsiella oxytoca NOT DETECTED NOT DETECTED Final   Klebsiella pneumoniae NOT DETECTED NOT DETECTED Final   Proteus species NOT DETECTED NOT DETECTED Final   Serratia marcescens NOT DETECTED NOT DETECTED Final   Carbapenem resistance NOT DETECTED NOT DETECTED Final   Haemophilus influenzae NOT DETECTED NOT DETECTED Final   Neisseria meningitidis NOT DETECTED NOT DETECTED Final   Pseudomonas aeruginosa NOT DETECTED NOT DETECTED Final   Candida albicans NOT DETECTED NOT DETECTED Final   Candida glabrata NOT DETECTED NOT DETECTED Final   Candida krusei NOT DETECTED NOT DETECTED Final   Candida parapsilosis NOT DETECTED NOT DETECTED Final   Candida tropicalis NOT DETECTED NOT DETECTED Final     Comment: Performed at [facility], [place] [address], [place], [place] [address]  Blood culture (routine x 2)     Status: None   Collection Time: 09/06/17  3:53 PM  Result Value Ref Range Status   Specimen Description   Final    BLOOD RIGHT WRIST Performed at [facility] [address], [place], [place] [address]    Special Requests   Final    BOTTLES DRAWN AEROBIC AND ANAEROBIC Blood Culture results may not be optimal due to an excessive volume of blood received in culture bottles Performed at [facility] [address], [place], [place] [address]    Culture   Final    NO GROWTH 5 DAYS Performed at [facility], [place] [address], [place], [place] [address]    Report Status 09/11/2017 FINAL  Final  Urine culture     Status: Abnormal   Collection Time: 09/06/17  7:24 PM  Result Value Ref Range Status   Specimen Description   Final    URINE, CLEAN CATCH Performed at [facility], [place] [address], [place], [place] [address]    Special Requests   Final    Normal Performed at [facility], [place] [address] [place], [place] [address]    Culture (A)  Final    60,000 COLONIES/mL ESCHERICHIA COLI 60,000 COLONIES/mL ENTEROCOCCUS FAECALIS    Report Status 09/10/2017 FINAL  Final   Organism ID, Bacteria ESCHERICHIA COLI (A)  Final   Organism ID, Bacteria ENTEROCOCCUS FAECALIS (A)  Final      Susceptibility   Escherichia coli - MIC*    AMPICILLIN >=32 RESISTANT Resistant     CEFAZOLIN <=4 SENSITIVE Sensitive     CEFTRIAXONE <=1 SENSITIVE Sensitive     CIPROFLOXACIN <=0.25 SENSITIVE Sensitive     GENTAMICIN <=1 SENSITIVE Sensitive     IMIPENEM <=0.25 SENSITIVE Sensitive     NITROFURANTOIN <=16 SENSITIVE Sensitive     TRIMETH/SULFA <=20 SENSITIVE Sensitive     AMPICILLIN/SULBACTAM  16 INTERMEDIATE Intermediate     PIP/TAZO <=4 SENSITIVE Sensitive     Extended ESBL NEGATIVE Sensitive     * 60,000  COLONIES/mL ESCHERICHIA COLI   Enterococcus faecalis - MIC*    AMPICILLIN <=2 SENSITIVE Sensitive     LEVOFLOXACIN >=8 RESISTANT Resistant     NITROFURANTOIN <=16 SENSITIVE Sensitive     VANCOMYCIN 1 SENSITIVE Sensitive     * 60,000 COLONIES/mL ENTEROCOCCUS FAECALIS  Culture, blood (routine x 2)     Status: None   Collection Time: 09/08/17  5:42 AM  Result Value Ref Range Status   Specimen Description   Final    BLOOD RIGHT ANTECUBITAL Performed at [facility] [address] [place], [place] [address]    Special Requests   Final    BOTTLES DRAWN AEROBIC AND ANAEROBIC Blood Culture adequate volume Performed at [facility] [address] [place], [place] [address]    Culture   Final    NO GROWTH 5 DAYS Performed at [facility], [place] [address] [place], [place] [address]    Report Status 09/13/2017 FINAL  Final  Culture, blood (routine x 2)     Status: None   Collection Time: 09/08/17  5:42 AM  Result Value Ref Range Status   Specimen Description   Final    BLOOD RIGHT HAND Performed at [facility] [address], [place], [place] [address]    Special Requests   Final    BOTTLES DRAWN AEROBIC AND ANAEROBIC Blood Culture adequate volume Performed at [facility] [address] [place], [place] [address]    Culture   Final    NO GROWTH 5 DAYS Performed at [facility], [place] [address] [place], [place] [address]    Report Status 09/13/2017 FINAL  Final    Radiology Reports Dg Chest 2 View  Result Date: 09/12/2017 CLINICAL DATA:  Medial chest pain.  Shortness of breath. EXAM: CHEST - 2 VIEW COMPARISON:  Chest x-rays dated 02/06/2016 and 09/15/2015. FINDINGS: Today's study is slightly hypoinspiratory with crowding of the perihilar and bibasilar bronchovascular markings. New subtle opacity is seen within the periphery of the RIGHT upper lung, suspicious for developing pneumonia. There  is also a new dense opacity at the posterior costophrenic angle on the lateral projection, most likely LEFT lower lobe, consistent with additional pneumonia, atelectasis and/or small pleural effusion. Questionable small RIGHT pleural effusion. No pneumothorax or large pleural effusion seen. Heart size and mediastinal contours are likely stable given the low lung volumes. IMPRESSION: 1. Subtle opacity within the RIGHT upper lung, suspicious for early developing pneumonia. Recommend follow-up chest x-ray to ensure resolution. 2. Additional dense opacity at the posterior costophrenic angle on the lateral view, most likely LEFT lower lobe, consistent with pneumonia, atelectasis and/or small pleural effusion. 3. Questionable small RIGHT pleural effusion. Electronically Signed   By: Franki Cabot M.D.   On: 09/12/2017 10:35   Nm Pulmonary Perf And Vent  Result Date: 09/12/2017 CLINICAL DATA:  Left side chest pain. EXAM: NUCLEAR MEDICINE VENTILATION - PERFUSION LUNG SCAN TECHNIQUE: Ventilation images were obtained in multiple projections using inhaled aerosol Tc-69m DTPA. Perfusion images were obtained in multiple projections after intravenous injection of Tc-65m-MAA. RADIOPHARMACEUTICALS:  26.5 mCi of Tc-61m DTPA aerosol inhalation and 3.8 mCi Tc40m-MAA IV COMPARISON:  Chest x-ray earlier today FINDINGS: Ventilation: Patchy nonsegmental ventilation defects Perfusion: Patchy nonsegmental matched perfusion defects. IMPRESSION: Patchy nonsegmental matched VQ defects. No mismatched defects.  Study is low probability for pulmonary embolus. Electronically Signed   By: Rolm Baptise M.D.   On: 09/12/2017 18:48    Time Spent in minutes  30   Jani Gravel M.D on 09/14/2017 at 5:50 AM  Between 7am to 7pm - Pager - [phone]   After 7pm go to [email or web address] - password [facility]  [facility] -  Office  [phone]

## 2017-09-14 NOTE — Procedures (Signed)
Interventional Radiology Procedure Note  Procedure: Tunneled right IJ CVC placement  Complications: None  Estimated Blood Loss: < 10 mL  Findings: Right IJ DL 6 Fr Power Line placed and tunneled to chest wall. Tip at SVC/RA junction.  Venetia Night. Kathlene Cote, M.D Pager:  [phone]

## 2017-09-14 NOTE — Progress Notes (Signed)
PHARMACY CONSULT NOTE FOR:  OUTPATIENT  PARENTERAL ANTIBIOTIC THERAPY (OPAT)  Indication: UTI with bacteremia Regimen: zosyn 2.25gm IV q6h (adjusted for renal function) End date: 09/19/2017  IV antibiotic discharge orders are pended. To discharging provider:  please sign these orders via discharge navigator,  Select New Orders & click on the button choice - Manage This Unsigned Work.     Thank you for allowing pharmacy to be a part of this patient's care.  Doreene Eland, PharmD, BCPS.   09/14/2017 12:19 PM

## 2017-09-15 ENCOUNTER — Other Ambulatory Visit: Payer: Self-pay | Admitting: Radiology

## 2017-09-15 LAB — COMPREHENSIVE METABOLIC PANEL
ALK PHOS: 81 U/L (ref 38–126)
ALT: 36 U/L (ref 0–44)
AST: 25 U/L (ref 15–41)
Albumin: 2.3 g/dL — ABNORMAL LOW (ref 3.5–5.0)
Anion gap: 8 (ref 5–15)
BUN: 38 mg/dL — ABNORMAL HIGH (ref 8–23)
CHLORIDE: 107 mmol/L (ref 98–111)
CO2: 26 mmol/L (ref 22–32)
Calcium: 9 mg/dL (ref 8.9–10.3)
Creatinine, Ser: 3.02 mg/dL — ABNORMAL HIGH (ref 0.61–1.24)
GFR calc Af Amer: 21 mL/min — ABNORMAL LOW (ref >60)
GFR calc non Af Amer: 18 mL/min — ABNORMAL LOW (ref >60)
Glucose, Bld: 118 mg/dL — ABNORMAL HIGH (ref 70–99)
Potassium: 4.2 mmol/L (ref 3.5–5.1)
Sodium: 141 mmol/L (ref 135–145)
Total Bilirubin: 0.6 mg/dL (ref 0.3–1.2)
Total Protein: 6 g/dL — ABNORMAL LOW (ref 6.5–8.1)

## 2017-09-15 LAB — RENAL FUNCTION PANEL
Albumin: 2.3 g/dL — ABNORMAL LOW (ref 3.5–5.0)
Anion gap: 10 (ref 5–15)
BUN: 38 mg/dL — ABNORMAL HIGH (ref 8–23)
CO2: 25 mmol/L (ref 22–32)
Calcium: 8.9 mg/dL (ref 8.9–10.3)
Chloride: 106 mmol/L (ref 98–111)
Creatinine, Ser: 3.07 mg/dL — ABNORMAL HIGH (ref 0.61–1.24)
GFR calc non Af Amer: 18 mL/min — ABNORMAL LOW (ref >60)
GFR, EST AFRICAN AMERICAN: 21 mL/min — AB (ref >60)
Glucose, Bld: 118 mg/dL — ABNORMAL HIGH (ref 70–99)
Phosphorus: 4 mg/dL (ref 2.5–4.6)
Potassium: 4.2 mmol/L (ref 3.5–5.1)
SODIUM: 141 mmol/L (ref 135–145)

## 2017-09-15 LAB — CBC
HCT: 29.1 % — ABNORMAL LOW (ref 39.0–52.0)
Hemoglobin: 9.3 g/dL — ABNORMAL LOW (ref 13.0–17.0)
MCH: 28.4 pg (ref 26.0–34.0)
MCHC: 32 g/dL (ref 30.0–36.0)
MCV: 88.7 fL (ref 78.0–100.0)
Platelets: 414 10*3/uL — ABNORMAL HIGH (ref 150–400)
RBC: 3.28 MIL/uL — ABNORMAL LOW (ref 4.22–5.81)
RDW: 13.9 % (ref 11.5–15.5)
WBC: 18.7 10*3/uL — ABNORMAL HIGH (ref 4.0–10.5)

## 2017-09-15 MED ORDER — COLCHICINE 0.6 MG PO TABS
0.6000 mg | ORAL_TABLET | Freq: Once | ORAL | Status: AC
  Administered 2017-09-15: 0.6 mg via ORAL
  Filled 2017-09-15: qty 1

## 2017-09-15 MED ORDER — COLCHICINE 0.6 MG PO TABS
0.6000 mg | ORAL_TABLET | Freq: Every day | ORAL | Status: DC
  Administered 2017-09-15 – 2017-09-18 (×4): 0.6 mg via ORAL
  Filled 2017-09-15 (×4): qty 1

## 2017-09-15 MED ORDER — PRO-STAT SUGAR FREE PO LIQD
30.0000 mL | Freq: Two times a day (BID) | ORAL | Status: DC
  Administered 2017-09-15 – 2017-09-20 (×11): 30 mL via ORAL
  Filled 2017-09-15 (×11): qty 30

## 2017-09-15 MED ORDER — SACCHAROMYCES BOULARDII 250 MG PO CAPS
250.0000 mg | ORAL_CAPSULE | Freq: Two times a day (BID) | ORAL | Status: DC
  Administered 2017-09-15 – 2017-09-20 (×10): 250 mg via ORAL
  Filled 2017-09-15 (×11): qty 1

## 2017-09-15 MED ORDER — SODIUM CHLORIDE 0.9% FLUSH
10.0000 mL | INTRAVENOUS | Status: DC | PRN
  Administered 2017-09-16: 10 mL
  Filled 2017-09-15: qty 40

## 2017-09-15 MED ORDER — SODIUM CHLORIDE 0.9 % IV SOLN
INTRAVENOUS | Status: AC
Start: 2017-09-15 — End: 2017-09-16
  Administered 2017-09-15: 08:00:00 via INTRAVENOUS

## 2017-09-15 NOTE — Progress Notes (Signed)
Physical Therapy Treatment Patient Details Name: Kenneth Williams MRN: [id number] DOB: 24-Jun-1935 Today's Date: 09/15/2017    History of Present Illness 82 yo male with PMH of BPH, hypertension, TIA, CKD stage III; admitted on 09/06/2017, presented with complaint of hematuria, was found to have sepsis secondary to E. coli UTI.    PT Comments    Pt c/o gout pain this session. Pt c/o significant leg/foot pain and declined to ambulate any further. Encouraged pt to try RW but he was not agreeable and requested to return to bed. Will continue to follow and progress activity as pt will allow.    Follow Up Recommendations  Home health PT;No PT follow up(depending on progress)     Equipment Recommendations  None recommended by PT    Recommendations for Other Services       Precautions / Restrictions Precautions Precautions: Fall Restrictions Weight Bearing Restrictions: No    Mobility  Bed Mobility Overal bed mobility: Needs Assistance Bed Mobility: Supine to Sit;Sit to Supine     Supine to sit: Supervision Sit to supine: Supervision   General bed mobility comments: for safety, lines.   Transfers Overall transfer level: Needs assistance Equipment used: None Transfers: Sit to/from Stand Sit to Stand: Min guard         General transfer comment: close guard for safety. Pt immediately grabbed on to IV pole once standing.   Ambulation/Gait Ambulation/Gait assistance: Min guard Gait Distance (Feet): 3 Feet Assistive device: IV Pole Gait Pattern/deviations: Step-to pattern;Antalgic     General Gait Details: pt c/o significant leg/foot pain and declined to ambulate any further. Encouraged pt to try RW but he was not agreeable and requested to return to bed.    Stairs             Wheelchair Mobility    Modified Rankin (Stroke Patients Only)       Balance                                            Cognition Arousal/Alertness:  Awake/alert Behavior During Therapy: WFL for tasks assessed/performed Overall Cognitive Status: Within Functional Limits for tasks assessed                                 General Comments: not highly motivated to mobilize      Exercises      General Comments        Pertinent Vitals/Pain Pain Assessment: Faces Faces Pain Scale: Hurts even more Pain Location: foot/leg (pt stated it was because of gout) Pain Descriptors / Indicators: Discomfort;Grimacing;Sharp Pain Intervention(s): Limited activity within patient's tolerance;Repositioned    Home Living                      Prior Function            PT Goals (current goals can now be found in the care plan section) Progress towards PT goals: Not progressing toward goals - comment(pt c/o gout pain-would not ambulate)    Frequency    Min 3X/week      PT Plan Current plan remains appropriate    Co-evaluation              AM-PAC PT "6 Clicks" Daily Activity  Outcome Measure  Difficulty turning over in bed (  including adjusting bedclothes, sheets and blankets)?: None Difficulty moving from lying on back to sitting on the side of the bed? : None Difficulty sitting down on and standing up from a chair with arms (e.g., wheelchair, bedside commode, etc,.)?: A Little Help needed moving to and from a bed to chair (including a wheelchair)?: A Little Help needed walking in hospital room?: A Little Help needed climbing 3-5 steps with a railing? : A Little 6 Click Score: 20    End of Session   Activity Tolerance: Patient limited by pain Patient left: in bed;with call bell/phone within reach;with bed alarm set   PT Visit Diagnosis: Difficulty in walking, not elsewhere classified (R26.2)     Time: 1419-1430 PT Time Calculation (min) (ACUTE ONLY): 11 min  Charges:  $Therapeutic Activity: 8-22 mins                        Weston Anna, MPT Pager: [phone]

## 2017-09-15 NOTE — Progress Notes (Signed)
[facility] will provide HHRN/HH services and Home Infusion Pharmacy services for pt at DC to support IV ABX at home.  If patient discharges after hours, please call [phone].   Larry Sierras 09/15/2017, 10:37 AM

## 2017-09-15 NOTE — Progress Notes (Addendum)
Patient ID: [name], male   DOB: 1935-06-24, 82 y.o.   MRN: [id number]                                                                PROGRESS NOTE                                                                                                                                                                                                             Patient Demographics:    Law Corsino, is a 82 y.o. male, DOB - 01-12-1936, IDP:[id number]  Admit date - 09/06/2017   Admitting Physician Mariel Aloe, MD  Outpatient Primary MD for the patient is Plotnikov, Evie Lacks, MD  LOS - 9  Outpatient Specialists:     Chief Complaint  Patient presents with  . Urinary Tract Infection       Brief Narrative    82 y.o.malewith medical history significant ofBPH, hypertension, TIA, CKD stage III.Patient reports a one-week history of gross hematuria. He has persistent symptoms of dysuria, hesitancy, frequency. He self caths secondary to urinary retention. He reports gross hematuria with clots that have improved over the past few days, although he has had associated fever, chills, muscle aches. He stopped self cathing after he started having symptoms of hematuria. He did not try anything else to help with his symptoms. 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  ED Course: Vitals:Afebrile, slightly hypertensive, normal pulse, normal respirations Labs:Glucose of 115, BUN of 41, creatinine of 3.01, WBC of 15.2K Imaging:None obtained. Per discussion with the EDP and urologist on-call, evidence of emphysematous cystitis on outpatient CT scan Medications/Course:Zosyn given. Blood cultures obtained prior to antibiotic therapy. Urine cultures obtained after antibiotic therapy.      Subjective:    [name] today has been afebrile. Denies dysuria, hematuria.    No headache, No chest pain, No  abdominal pain - No Nausea, No new weakness tingling or numbness, No Cough - SOB.    Assessment  & Plan :    Active Problems:   Hypothyroidism   TIA (transient ischemic attack)   Carotid artery stenosis   BPH (benign prostatic hyperplasia)   UTI (urinary tract infection)   Pyelonephritis  1. Sepsis secondary to E. coli bacteremia and E. coli and enterococcus UTI. Hematuria. Indwelling Foley catheter. Chronic urinary retention. Persistent Leukocytosis Urology consulted. Urine culture performed in the clinic. Blood culture performed here is positive for E. coli.Repeat culture is negative. Urine culture performed in the clinic showed E. coli as well asunidentified organism. Urine culture performed here in the hospital is showing E. coli and enterococcus. Blood cultures growing E. coli only Difficult choices, enterococcus is resistant to levofloxacin and E. coli is resistance to ampicillin and Unasyn. no simple, single oral antibiotic choice available at present.  Patient had catheter leakage and bladder spasm, catheter was changed on 09/08/2017 again, following which symptoms are resolved. Highly appreciate all the urologist involved in patient's care. Urologist does not anticipate any inpatient procedure. Patient will discharge home with Foley catheter in place. Appreciate ID input as well. Currently on IV Zosyn. Plan is to complete a total of 14 days of treatment with IV Zosyn. Completed 7 days and will need 7 more days of IV antibiotics. Due to his CKD status IR guided line recommended and consult placed. We will keep the patient n.p.o. after midnight also holding heparin. Central line placed 7/24  Leukocytosis, still high Cont currently abx,  Repeat cbc in am  Chest pain. Reported some chest pain this morning. EKG known ischemic. Troponins are negative so far. Monitor serial troponin. Continue monitoring on telemetry.Telemetry suggest SVT, continue  beta-blocker. D-dimer is mildly elevated and patient has been in the hospital and therefore we will check a VQ scan. Chest x-ray made a comment regarding possible bilateral pneumonia although patient is on antibiotic and I do not suspect that the patient actually has an active infection but patient may benefit from incentive spirometry. Pain medication change from tramadol to Percocet. Currently pain-free.  Acute kidney injury on CKD stage III Function getting better. Continue close monitoring encourage p.o. Fluids. Renal function getting better after replacement of the Foley catheter. Encouraging p.o. fluids. Discussed with nephrology, recommend PCP to refer outpatient for establishing care with nephrologist. Not a candidate for PICC line or midline. TLC placed 7/24 Hydrate with ns gently due to slight worsening in renal function Check cmp in am   Hypothyroidism -Continue Synthroid  BPH Contributing to current presentation -Continue finasteride and alfuzosin  Hypertension Uncontrolled. -Continue home amlodipineand labetalol -Hydralazine prn  History of dyslipidemia -Continue Pravastatin  Constipation and poor p.o. intake. Continue stool softeners.  Severe protein calorie malnutrition Start on Prostat 44mL po bid  Diet: car modified diet DVT Prophylaxis:Heparin  Advance goals of care discussion:DNR DNI  Family Communication:no family was present at bedside, at the time of interview.   Disposition: Discharge to home, PT consulted recommend no PT  Consultants:urology, infectious disease Procedures: none      Anti-infectives (From admission, onward)   Start     Dose/Rate Route Frequency Ordered Stop   09/09/17 1800  piperacillin-tazobactam (ZOSYN) IVPB 3.375 g     3.375 g 12.5 mL/hr over 240 Minutes Intravenous Every 8 hours 09/09/17 1331     09/09/17 1300  cefTRIAXone (ROCEPHIN) 2 g in sodium chloride 0.9 % 100 mL IVPB  Status:  Discontinued      2 g 200 mL/hr over 30 Minutes Intravenous Every 24 hours 09/09/17 1146 09/09/17 1314   09/07/17 0000  piperacillin-tazobactam (ZOSYN) IVPB 3.375 g  Status:  Discontinued     3.375 g 12.5 mL/hr over 240 Minutes Intravenous Every 8 hours 09/06/17 2259 09/09/17 1146   09/06/17 1600  ceFEPIme (MAXIPIME) 2 g in sodium chloride 0.9 % 100 mL IVPB  Status:  Discontinued     2 g 200 mL/hr over 30 Minutes Intravenous  Once 09/06/17 1528 09/06/17 1539   09/06/17 1545  piperacillin-tazobactam (ZOSYN) IVPB 3.375 g     3.375 g 100 mL/hr over 30 Minutes Intravenous  Once 09/06/17 1543 09/06/17 1644        Objective:   Vitals:   09/14/17 1219 09/14/17 2123 09/15/17 0328 09/15/17 0521  BP: (!) 149/68 (!) 130/58 (!) 157/70 (!) 156/68  Pulse: 69 73 75 73  Resp: 16 18 16 18   Temp: 98.4 F (36.9 C) 98.6 F (37 C)  99.1 F (37.3 C)  TempSrc: Oral Oral  Oral  SpO2: 98% 97% 97% 96%  Weight:      Height:        Wt Readings from Last 3 Encounters:  09/06/17 86.1 kg (189 lb 12.8 oz)  08/17/17 86.2 kg (190 lb)  06/29/17 84.8 kg (187 lb)     Intake/Output Summary (Last 24 hours) at 09/15/2017 0707 Last data filed at 09/15/2017 0700 Gross per 24 hour  Intake 339.8 ml  Output 2270 ml  Net -1930.2 ml     Physical Exam  Awake Alert, Oriented X 3, No new F.N deficits, Normal affect [place].AT,PERRAL Supple Neck,No JVD, No cervical lymphadenopathy appriciated.  Symmetrical Chest wall movement, Good air movement bilaterally, CTAB RRR,No Gallops,Rubs or new Murmurs, No Parasternal Heave +ve B.Sounds, Abd Soft, No tenderness, No organomegaly appriciated, No rebound - guarding or rigidity. No Cyanosis, Clubbing or edema, No new Rash or bruise  Rupper chest TLC in place     Data Review:    CBC Recent Labs  Lab 09/09/17 0831 09/14/17 0427 09/15/17 0419  WBC 15.6* 19.2* 18.7*  HGB 9.5* 9.1* 9.3*  HCT 29.8* 28.8* 29.1*  PLT 294 440* 414*  MCV 88.2 88.3 88.7  MCH 28.1 27.9 28.4  MCHC  31.9 31.6 32.0  RDW 13.7 13.9 13.9  LYMPHSABS 1.9 2.6  --   MONOABS 0.9 0.9  --   EOSABS 0.6 0.5  --   BASOSABS 0.0 0.1  --     Chemistries  Recent Labs  Lab 09/11/17 0401 09/12/17 0431 09/13/17 0432 09/14/17 0427 09/15/17 0419  NA 140 141 141 139 141  141  K 4.1 3.9 4.0 4.1 4.2  4.2  CL 109 108 107 107 107  106  CO2 22 24 22 24 26  25   GLUCOSE 93 99 91 112* 118*  118*  BUN 27* 30* 31* 37* 38*  38*  CREATININE 2.70* 2.82* 2.72* 2.78* 3.02*  3.07*  CALCIUM 8.4* 8.5* 8.9 8.6* 9.0  8.9  AST  --   --   --   --  25  ALT  --   --   --   --  36  ALKPHOS  --   --   --   --  81  BILITOT  --   --   --   --  0.6   ------------------------------------------------------------------------------------------------------------------ No results for input(s): CHOL, HDL, LDLCALC, TRIG, CHOLHDL, LDLDIRECT in the last 72 hours.  No results found for: HGBA1C ------------------------------------------------------------------------------------------------------------------ No results for input(s): TSH, T4TOTAL, T3FREE, THYROIDAB in the last 72 hours.  Invalid input(s): FREET3 ------------------------------------------------------------------------------------------------------------------ No results for input(s): VITAMINB12, FOLATE, FERRITIN, TIBC, IRON, RETICCTPCT in the last 72 hours.  Coagulation profile Recent Labs  Lab 09/13/17 1748  INR 1.03    Recent Labs  09/12/17 0949  DDIMER 2.46*    Cardiac Enzymes Recent Labs  Lab 09/12/17 0949 09/12/17 1657 09/12/17 2117  TROPONINI <0.03 <0.03 <0.03   ------------------------------------------------------------------------------------------------------------------ No results found for: BNP  Inpatient Medications  Scheduled Meds: . alfuzosin  10 mg Oral Q breakfast  . amLODipine  5 mg Oral Daily  . cholecalciferol  2,000 Units Oral Daily  . feeding supplement (NEPRO CARB STEADY)  237 mL Oral TID BM  . feeding  supplement (PRO-STAT SUGAR FREE 64)  30 mL Oral BID  . finasteride  5 mg Oral Daily  . isosorbide mononitrate  30 mg Oral Daily  . labetalol  300 mg Oral BID  . levothyroxine  88 mcg Oral QAC breakfast  . multivitamin with minerals  1 tablet Oral Daily  . polyethylene glycol  17 g Oral Daily  . pravastatin  20 mg Oral Daily  . senna-docusate  1 tablet Oral BID   Continuous Infusions: . sodium chloride 10 mL/hr at 09/14/17 1245  . sodium chloride    . piperacillin-tazobactam (ZOSYN)  IV Stopped (09/15/17 0607)   PRN Meds:.acetaminophen **OR** acetaminophen, hydrALAZINE, menthol-cetylpyridinium, oxybutynin, oxyCODONE-acetaminophen, sodium chloride flush  Micro Results Recent Results (from the past 240 hour(s))  Blood culture (routine x 2)     Status: Abnormal   Collection Time: 09/06/17  3:48 PM  Result Value Ref Range Status   Specimen Description   Final    BLOOD LEFT WRIST Performed at [facility] [address], [place], [place] [address]    Special Requests   Final    BOTTLES DRAWN AEROBIC AND ANAEROBIC Blood Culture results may not be optimal due to an inadequate volume of blood received in culture bottles Performed at [facility] [address] [place], [place] [address]    Culture  Setup Time   Final    GRAM NEGATIVE RODS IN BOTH AEROBIC AND ANAEROBIC BOTTLES CRITICAL RESULT CALLED TO, READ BACK BY AND VERIFIED WITH: Damian Leavell [id number] MLM Performed at [facility], [address] [place], [place] [address]    Culture ESCHERICHIA COLI (A)  Final   Report Status 09/09/2017 FINAL  Final   Organism ID, Bacteria ESCHERICHIA COLI  Final      Susceptibility   Escherichia coli - MIC*    AMPICILLIN >=32 RESISTANT Resistant     CEFAZOLIN <=4 SENSITIVE Sensitive     CEFEPIME <=1 SENSITIVE Sensitive     CEFTAZIDIME <=1 SENSITIVE Sensitive     CEFTRIAXONE <=1 SENSITIVE Sensitive     CIPROFLOXACIN <=0.25 SENSITIVE  Sensitive     GENTAMICIN <=1 SENSITIVE Sensitive     IMIPENEM <=0.25 SENSITIVE Sensitive     TRIMETH/SULFA <=20 SENSITIVE Sensitive     AMPICILLIN/SULBACTAM 16 INTERMEDIATE Intermediate     PIP/TAZO <=4 SENSITIVE Sensitive     Extended ESBL NEGATIVE Sensitive     * ESCHERICHIA COLI  Blood Culture ID Panel (Reflexed)     Status: Abnormal   Collection Time: 09/06/17  3:48 PM  Result Value Ref Range Status   Enterococcus species NOT DETECTED NOT DETECTED Final   Listeria monocytogenes NOT DETECTED NOT DETECTED Final   Staphylococcus species NOT DETECTED NOT DETECTED Final   Staphylococcus aureus NOT DETECTED NOT DETECTED Final   Streptococcus species NOT DETECTED NOT DETECTED Final   Streptococcus agalactiae NOT DETECTED NOT DETECTED Final   Streptococcus pneumoniae NOT DETECTED NOT DETECTED Final   Streptococcus pyogenes NOT DETECTED NOT DETECTED Final  Acinetobacter baumannii NOT DETECTED NOT DETECTED Final   Enterobacteriaceae species DETECTED (A) NOT DETECTED Final    Comment: Enterobacteriaceae represent a large family of gram-negative bacteria, not a single organism. CRITICAL RESULT CALLED TO, READ BACK BY AND VERIFIED WITH: PHARMD J GADHIA [id number] MLM    Enterobacter cloacae complex NOT DETECTED NOT DETECTED Final   Escherichia coli DETECTED (A) NOT DETECTED Final    Comment: CRITICAL RESULT CALLED TO, READ BACK BY AND VERIFIED WITH: PHARMD J GADHIA [id number] 0921 MLM    Klebsiella oxytoca NOT DETECTED NOT DETECTED Final   Klebsiella pneumoniae NOT DETECTED NOT DETECTED Final   Proteus species NOT DETECTED NOT DETECTED Final   Serratia marcescens NOT DETECTED NOT DETECTED Final   Carbapenem resistance NOT DETECTED NOT DETECTED Final   Haemophilus influenzae NOT DETECTED NOT DETECTED Final   Neisseria meningitidis NOT DETECTED NOT DETECTED Final   Pseudomonas aeruginosa NOT DETECTED NOT DETECTED Final   Candida albicans NOT DETECTED NOT DETECTED Final   Candida glabrata  NOT DETECTED NOT DETECTED Final   Candida krusei NOT DETECTED NOT DETECTED Final   Candida parapsilosis NOT DETECTED NOT DETECTED Final   Candida tropicalis NOT DETECTED NOT DETECTED Final    Comment: Performed at [facility], [place] [address], [place], [place] [address]  Blood culture (routine x 2)     Status: None   Collection Time: 09/06/17  3:53 PM  Result Value Ref Range Status   Specimen Description   Final    BLOOD RIGHT WRIST Performed at [facility] [address], [place], [place] [address]    Special Requests   Final    BOTTLES DRAWN AEROBIC AND ANAEROBIC Blood Culture results may not be optimal due to an excessive volume of blood received in culture bottles Performed at [facility] [address] [place], [place] [address]    Culture   Final    NO GROWTH 5 DAYS Performed at [facility], [place] [address] [place], [place] [address]    Report Status 09/11/2017 FINAL  Final  Urine culture     Status: Abnormal   Collection Time: 09/06/17  7:24 PM  Result Value Ref Range Status   Specimen Description   Final    URINE, CLEAN CATCH Performed at [facility], [place] [address], [place],  [address]    Special Requests   Final    Normal Performed at [facility], [place] [address], [place], [place] [address]    Culture (A)  Final    60,000 COLONIES/mL ESCHERICHIA COLI 60,000 COLONIES/mL ENTEROCOCCUS FAECALIS    Report Status 09/10/2017 FINAL  Final   Organism ID, Bacteria ESCHERICHIA COLI (A)  Final   Organism ID, Bacteria ENTEROCOCCUS FAECALIS (A)  Final      Susceptibility   Escherichia coli - MIC*    AMPICILLIN >=32 RESISTANT Resistant     CEFAZOLIN <=4 SENSITIVE Sensitive     CEFTRIAXONE <=1 SENSITIVE Sensitive     CIPROFLOXACIN <=0.25 SENSITIVE Sensitive     GENTAMICIN <=1 SENSITIVE Sensitive     IMIPENEM <=0.25 SENSITIVE Sensitive     NITROFURANTOIN <=16 SENSITIVE  Sensitive     TRIMETH/SULFA <=20 SENSITIVE Sensitive     AMPICILLIN/SULBACTAM 16 INTERMEDIATE Intermediate     PIP/TAZO <=4 SENSITIVE Sensitive     Extended ESBL NEGATIVE Sensitive     * 60,000 COLONIES/mL ESCHERICHIA COLI   Enterococcus faecalis - MIC*    AMPICILLIN <=2  SENSITIVE Sensitive     LEVOFLOXACIN >=8 RESISTANT Resistant     NITROFURANTOIN <=16 SENSITIVE Sensitive     VANCOMYCIN 1 SENSITIVE Sensitive     * 60,000 COLONIES/mL ENTEROCOCCUS FAECALIS  Culture, blood (routine x 2)     Status: None   Collection Time: 09/08/17  5:42 AM  Result Value Ref Range Status   Specimen Description   Final    BLOOD RIGHT ANTECUBITAL Performed at [facility] [address], [place], [place] [address]    Special Requests   Final    BOTTLES DRAWN AEROBIC AND ANAEROBIC Blood Culture adequate volume Performed at [facility] [address] [place], [place] [address]    Culture   Final    NO GROWTH 5 DAYS Performed at [facility], [place] [address] [place], [place] [address]    Report Status 09/13/2017 FINAL  Final  Culture, blood (routine x 2)     Status: None   Collection Time: 09/08/17  5:42 AM  Result Value Ref Range Status   Specimen Description   Final    BLOOD RIGHT HAND Performed at [facility] [address], [place], [place] [address]    Special Requests   Final    BOTTLES DRAWN AEROBIC AND ANAEROBIC Blood Culture adequate volume Performed at [facility] [address], [place],  [address]    Culture   Final    NO GROWTH 5 DAYS Performed at [facility], [place] [address] [place],  [address]    Report Status 09/13/2017 FINAL  Final    Radiology Reports Dg Chest 2 View  Result Date: 09/12/2017 CLINICAL DATA:  Medial chest pain.  Shortness of breath. EXAM: CHEST - 2 VIEW COMPARISON:  Chest x-rays dated 02/06/2016 and 09/15/2015. FINDINGS: Today's study is slightly  hypoinspiratory with crowding of the perihilar and bibasilar bronchovascular markings. New subtle opacity is seen within the periphery of the RIGHT upper lung, suspicious for developing pneumonia. There is also a new dense opacity at the posterior costophrenic angle on the lateral projection, most likely LEFT lower lobe, consistent with additional pneumonia, atelectasis and/or small pleural effusion. Questionable small RIGHT pleural effusion. No pneumothorax or large pleural effusion seen. Heart size and mediastinal contours are likely stable given the low lung volumes. IMPRESSION: 1. Subtle opacity within the RIGHT upper lung, suspicious for early developing pneumonia. Recommend follow-up chest x-ray to ensure resolution. 2. Additional dense opacity at the posterior costophrenic angle on the lateral view, most likely LEFT lower lobe, consistent with pneumonia, atelectasis and/or small pleural effusion. 3. Questionable small RIGHT pleural effusion. Electronically Signed   By: Franki Cabot M.D.   On: 09/12/2017 10:35   Nm Pulmonary Perf And Vent  Result Date: 09/12/2017 CLINICAL DATA:  Left side chest pain. EXAM: NUCLEAR MEDICINE VENTILATION - PERFUSION LUNG SCAN TECHNIQUE: Ventilation images were obtained in multiple projections using inhaled aerosol Tc-1m DTPA. Perfusion images were obtained in multiple projections after intravenous injection of Tc-71m-MAA. RADIOPHARMACEUTICALS:  26.5 mCi of Tc-16m DTPA aerosol inhalation and 3.8 mCi Tc64m-MAA IV COMPARISON:  Chest x-ray earlier today FINDINGS: Ventilation: Patchy nonsegmental ventilation defects Perfusion: Patchy nonsegmental matched perfusion defects. IMPRESSION: Patchy nonsegmental matched VQ defects. No mismatched defects. Study is low probability for pulmonary embolus. Electronically Signed   By: Rolm Baptise M.D.   On: 09/12/2017 18:48   Ir Fluoro Guide Cv Line Right  Result Date: 09/14/2017 CLINICAL DATA:  Emphysematous cystitis, E coli  bacteremia  and need for tunneled central venous catheter for IV antibiotic therapy. Need to preserve upper extremity veins due to underlying significant chronic kidney disease. EXAM: TUNNELED CENTRAL VENOUS CATHETER PLACEMENT WITH ULTRASOUND AND FLUOROSCOPIC GUIDANCE ANESTHESIA/SEDATION: 1.0 mg IV Versed; 50 mcg IV Fentanyl. Total Moderate Sedation Time:   13 minutes. The patient's level of consciousness and physiologic status were continuously monitored during the procedure by Radiology nursing. MEDICATIONS: None FLUOROSCOPY TIME:  6 seconds.  1.3 mGy. PROCEDURE: The procedure, risks, benefits, and alternatives were explained to the patient. Questions regarding the procedure were encouraged and answered. The patient understands and consents to the procedure. A timeout was performed prior to initiating the procedure. Ultrasound was utilized to confirm patency of the right internal jugular vein. The right neck and chest were prepped with chlorhexidine in a sterile fashion, and a sterile drape was applied covering the operative field. Maximum barrier sterile technique with sterile gowns and gloves were used for the procedure. Local anesthesia was provided with 1% lidocaine. After creating a small venotomy incision, a 21 gauge needle was advanced into the right internal jugular vein under direct, real-time ultrasound guidance. Ultrasound image documentation was performed. After securing guidewire access, 6 French peel-away sheath was placed. A wire was utilized to estimate catheter length. A 6 French, dual-lumen power line tunneled catheter was chosen for placement. This was tunneled in a retrograde fashion from the chest wall to the venotomy incision. The catheter was cut to 26 cm based on guidewire measurement. The catheter was placed through the peel-away sheath and the sheath removed. The venotomy incision was closed with subcutaneous subcuticular 4-0 Vicryl. Dermabond was applied to the incision. The catheter exit site was  secured Prolene and Ethilon retention sutures. COMPLICATIONS: None.  No pneumothorax. FINDINGS: After catheter placement, the tip lies at the SVC/RA junction. The catheter aspirates normally and is ready for immediate use. IMPRESSION: Placement of tunneled central venous catheter via the right internal jugular vein. The catheter tip lies at the SVC/RA junction. The catheter is ready for immediate use. Electronically Signed   By: Aletta Edouard M.D.   On: 09/14/2017 12:03   Ir US Guide Vasc Access Right  Result Date: 09/14/2017 CLINICAL DATA:  Emphysematous cystitis, E coli bacteremia and need for tunneled central venous catheter for IV antibiotic therapy. Need to preserve upper extremity veins due to underlying significant chronic kidney disease. EXAM: TUNNELED CENTRAL VENOUS CATHETER PLACEMENT WITH ULTRASOUND AND FLUOROSCOPIC GUIDANCE ANESTHESIA/SEDATION: 1.0 mg IV Versed; 50 mcg IV Fentanyl. Total Moderate Sedation Time:   13 minutes. The patient's level of consciousness and physiologic status were continuously monitored during the procedure by Radiology nursing. MEDICATIONS: None FLUOROSCOPY TIME:  6 seconds.  1.3 mGy. PROCEDURE: The procedure, risks, benefits, and alternatives were explained to the patient. Questions regarding the procedure were encouraged and answered. The patient understands and consents to the procedure. A timeout was performed prior to initiating the procedure. Ultrasound was utilized to confirm patency of the right internal jugular vein. The right neck and chest were prepped with chlorhexidine in a sterile fashion, and a sterile drape was applied covering the operative field. Maximum barrier sterile technique with sterile gowns and gloves were used for the procedure. Local anesthesia was provided with 1% lidocaine. After creating a small venotomy incision, a 21 gauge needle was advanced into the right internal jugular vein under direct, real-time ultrasound guidance. Ultrasound image  documentation was performed. After securing guidewire access, 6 French peel-away sheath was placed. A wire was  utilized to estimate catheter length. A 6 French, dual-lumen power line tunneled catheter was chosen for placement. This was tunneled in a retrograde fashion from the chest wall to the venotomy incision. The catheter was cut to 26 cm based on guidewire measurement. The catheter was placed through the peel-away sheath and the sheath removed. The venotomy incision was closed with subcutaneous subcuticular 4-0 Vicryl. Dermabond was applied to the incision. The catheter exit site was secured Prolene and Ethilon retention sutures. COMPLICATIONS: None.  No pneumothorax. FINDINGS: After catheter placement, the tip lies at the SVC/RA junction. The catheter aspirates normally and is ready for immediate use. IMPRESSION: Placement of tunneled central venous catheter via the right internal jugular vein. The catheter tip lies at the SVC/RA junction. The catheter is ready for immediate use. Electronically Signed   By: Aletta Edouard M.D.   On: 09/14/2017 12:03    Time Spent in minutes  30   Jani Gravel M.D on 09/15/2017 at 7:07 AM  Between 7am to 7pm - Pager - [phone]    After 7pm go to [email or web address] - password [facility]  [facility] -  Office  [phone]

## 2017-09-15 NOTE — Progress Notes (Signed)
Patient had an 8 beat run of V-tach while sleeping. Patient denied any chest pain or shortness of breath. Vitals were taken and NP on call was notified. Will continue to monitor patient.

## 2017-09-16 ENCOUNTER — Ambulatory Visit: Payer: PPO | Admitting: Internal Medicine

## 2017-09-16 DIAGNOSIS — E039 Hypothyroidism, unspecified: Secondary | ICD-10-CM

## 2017-09-16 DIAGNOSIS — M1000 Idiopathic gout, unspecified site: Secondary | ICD-10-CM

## 2017-09-16 LAB — CBC
HCT: 28.6 % — ABNORMAL LOW (ref 39.0–52.0)
HEMOGLOBIN: 9 g/dL — AB (ref 13.0–17.0)
MCH: 28.4 pg (ref 26.0–34.0)
MCHC: 31.5 g/dL (ref 30.0–36.0)
MCV: 90.2 fL (ref 78.0–100.0)
PLATELETS: 421 10*3/uL — AB (ref 150–400)
RBC: 3.17 MIL/uL — ABNORMAL LOW (ref 4.22–5.81)
RDW: 13.9 % (ref 11.5–15.5)
WBC: 17.7 10*3/uL — ABNORMAL HIGH (ref 4.0–10.5)

## 2017-09-16 LAB — RENAL FUNCTION PANEL
Albumin: 2.2 g/dL — ABNORMAL LOW (ref 3.5–5.0)
Anion gap: 9 (ref 5–15)
BUN: 41 mg/dL — ABNORMAL HIGH (ref 8–23)
CALCIUM: 8.7 mg/dL — AB (ref 8.9–10.3)
CO2: 24 mmol/L (ref 22–32)
CREATININE: 2.85 mg/dL — AB (ref 0.61–1.24)
Chloride: 108 mmol/L (ref 98–111)
GFR calc non Af Amer: 19 mL/min — ABNORMAL LOW (ref >60)
GFR, EST AFRICAN AMERICAN: 23 mL/min — AB (ref >60)
GLUCOSE: 105 mg/dL — AB (ref 70–99)
Phosphorus: 4.4 mg/dL (ref 2.5–4.6)
Potassium: 4.3 mmol/L (ref 3.5–5.1)
SODIUM: 141 mmol/L (ref 135–145)

## 2017-09-16 LAB — COMPREHENSIVE METABOLIC PANEL
ALK PHOS: 70 U/L (ref 38–126)
ALT: 31 U/L (ref 0–44)
ANION GAP: 8 (ref 5–15)
AST: 21 U/L (ref 15–41)
Albumin: 2.2 g/dL — ABNORMAL LOW (ref 3.5–5.0)
BUN: 41 mg/dL — ABNORMAL HIGH (ref 8–23)
CALCIUM: 8.7 mg/dL — AB (ref 8.9–10.3)
CO2: 25 mmol/L (ref 22–32)
Chloride: 108 mmol/L (ref 98–111)
Creatinine, Ser: 2.8 mg/dL — ABNORMAL HIGH (ref 0.61–1.24)
GFR, EST AFRICAN AMERICAN: 23 mL/min — AB (ref >60)
GFR, EST NON AFRICAN AMERICAN: 20 mL/min — AB (ref >60)
Glucose, Bld: 106 mg/dL — ABNORMAL HIGH (ref 70–99)
Potassium: 4.3 mmol/L (ref 3.5–5.1)
SODIUM: 141 mmol/L (ref 135–145)
Total Bilirubin: 0.6 mg/dL (ref 0.3–1.2)
Total Protein: 5.6 g/dL — ABNORMAL LOW (ref 6.5–8.1)

## 2017-09-16 NOTE — Progress Notes (Signed)
Pharmacy Antibiotic Note  Kenneth Williams is a 82 y.o. male admitted on 09/06/2017 with UTI and E.coli bacteremia. Patient has Hx pseudomonal UTI. 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  Pharmacy has been consulted for piperacillin/tazobactam dosing.  Today, 09/16/17  Today is day #10 of IV antibiotics ( plan is for 14 days(  OPAT completed  WBC elevated but trending down   Afebrile  Line placed by INR    Plan:  Continue piperacillin/tazobactam 3.375 g IV q8h EI, plan is to continue through 09/19/17   Dose at discharge to be 2.25gm IV q6h (30 min infusion)  Follow SCr closely for any renal adjustments needed  Follow cultures as needed   Height: 6\' 2"  (188 cm) Weight: 189 lb 12.8 oz (86.1 kg) IBW/kg (Calculated) : 82.2  Temp (24hrs), Avg:98.8 F (37.1 C), Min:98.8 F (37.1 C), Max:98.9 F (37.2 C)  Recent Labs  Lab 09/09/17 0831  09/12/17 0431 09/13/17 0432 09/14/17 0427 09/15/17 0419 09/16/17 0357  WBC 15.6*  --   --   --  19.2* 18.7* 17.7*  CREATININE 2.74*   < > 2.82* 2.72* 2.78* 3.02*  3.07* 2.80*  2.85*   < > = values in this interval not displayed.    Estimated Creatinine Clearance: 24 mL/min (A) (by C-G formula based on SCr of 2.85 mg/dL (H)).    Allergies  Allergen Reactions  . Lipitor [Atorvastatin]     diarrhea  . Spironolactone     gynecomastia  . Zocor [Simvastatin]     achy  . Coreg [Carvedilol] Diarrhea    Diarrhea and bradycardia  . Donepezil Hydrochloride Diarrhea    REACTION: diarrhea  . Hydrochlorothiazide Other (See Comments)    REACTION: Gout  . Razadyne [Galantamine Hydrobromide] Diarrhea    diarrhea    Dose adjustments this admission: n/a  Microbiology results: 7/16 BCx: E.coli, R amp, Int unasyn 7/16 UCx: 60K Ecoli and enterococcus faecalis (only levo resis) 7/18 repeat BCx: ngtd  Thank you for allowing pharmacy to be a part of this patient's  care.   Doreene Eland, PharmD, BCPS.   Pager: [phone] 09/16/2017 7:14 AM

## 2017-09-16 NOTE — Progress Notes (Signed)
Patient ID: Kenneth Williams, male   DOB: June 10, 1935, 82 y.o.   MRN: [id number]                                                                PROGRESS NOTE                                                                                                                                                                                                             Patient Demographics:    Kenneth Williams, is a 82 y.o. male, DOB - 31-Jul-1935, ZDG:[id number]  Admit date - 09/06/2017   Admitting Physician Mariel Aloe, MD  Outpatient Primary MD for the patient is Plotnikov, Evie Lacks, MD  LOS - 10  Outpatient Specialists:     Chief Complaint  Patient presents with  . Urinary Tract Infection       Brief Narrative   Brief Narrative    82 y.o.malewith medical history significant ofBPH, hypertension, TIA, CKD stage III.Patient reports a one-week history of gross hematuria. He has persistent symptoms of dysuria, hesitancy, frequency. He self caths secondary to urinary retention. He reports gross hematuria with clots that have improved over the past few days, although he has had associated fever, chills, muscle aches. He stopped self cathing after he started having symptoms of hematuria. He did not try anything else to help with his symptoms. 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  ED Course: Vitals:Afebrile, slightly hypertensive, normal pulse, normal respirations Labs:Glucose of 115, BUN of 41, creatinine of 3.01, WBC of 15.2K Imaging:None obtained. Per discussion with the EDP and urologist on-call, evidence of emphysematous cystitis on outpatient CT scan Medications/Course:Zosyn given. Blood cultures obtained prior to antibiotic therapy. Urine cultures obtained after antibiotic therapy.      Subjective:    Kenneth Williams today states that his gout attack L foot  is getting better.  afebrile  No headache, No chest pain, No abdominal pain - No Nausea, No new weakness tingling or numbness, No Cough - SOB.    Assessment  & Plan :    Active Problems:   Hypothyroidism   TIA (transient ischemic attack)   Carotid artery stenosis   BPH (benign prostatic hyperplasia)   UTI (  urinary tract infection)   Pyelonephritis     Leukocytosis, still high, slowly improving Cont currently abx,  Repeat cbc in am   Sepsis secondary to E. coli bacteremia and E. coli and enterococcus UTI. Hematuria. Indwelling Foley catheter. Chronic urinary retention. Persistent Leukocytosis Urology consulted. Urine culture performed in the clinic. Blood culture performed here is positive for E. coli.Repeat culture is negative. Urine culture performed in the clinic showed E. coli as well asunidentified organism. Urine culture performed here in the hospital is showing E. coli and enterococcus. Blood cultures growing E. coli only Difficult choices, enterococcus is resistant to levofloxacin and E. coli is resistance to ampicillin and Unasyn. no simple, single oral antibiotic choice available at present.  Patient had catheter leakage and bladder spasm, catheter was changed on 09/08/2017 again, following which symptoms are resolved. Highly appreciate all the urologist involved in patient's care. Urologist does not anticipate any inpatient procedure. Patient will discharge home with Foley catheter in place. Appreciate ID input as well. Currently on IV Zosyn. Plan is to complete a total of 14 days of treatment with IV Zosyn.  Due to his CKD status IR guided line recommended and consult placed. We will keep the patient n.p.o. after midnight also holding heparin. Central line placed 7/24  Chest pain. Reported some chest pain this morning. EKG known ischemic. Troponins are negative so far. Monitor serial troponin. Continue monitoring on telemetry.Telemetry suggest SVT, continue  beta-blocker. D-dimer is mildly elevated and patient has been in the hospital and therefore we will check a VQ scan. Chest x-ray made a comment regarding possible bilateral pneumonia although patient is on antibiotic and I do not suspect that the patient actually has an active infection but patient may benefit from incentive spirometry. Pain medication change from tramadol to Percocet. Currently pain-free.  Acute kidney injury on CKD stage III Function getting better. Continue close monitoring encourage p.o. Fluids. Renal function getting better after replacement of the Foley catheter. Encouraging p.o. fluids. Discussed with nephrology, recommend PCP to refer outpatient for establishing care with nephrologist. Not a candidate for PICC line or midline. TLC placed 7/24 Hydrate with ns gently due to slight worsening in renal function Check cmp in am   Hypothyroidism -Continue Synthroid  BPH Contributing to current presentation -Continue finasteride and alfuzosin  Hypertension Uncontrolled. -Continue home amlodipineand labetalol -Hydralazine prn  History of dyslipidemia -Continue Pravastatin  Constipation and poor p.o. intake. Continue stool softeners.  Severe protein calorie malnutrition Start on Prostat 3mL po bid  Gout attack Started colchicine 7/25 Diet: car modified diet DVT Prophylaxis:Heparin  Advance goals of care discussion:DNR DNI  Family Communication:no family was present at bedside, at the time of interview.   Disposition: Discharge to home, PT consulted recommend no PT  Consultants:urology, infectious disease Procedures: none  Barrier to Discharge: white count elevation     Lab Results  Component Value Date   PLT 421 (H) 09/16/2017     Anti-infectives (From admission, onward)   Start     Dose/Rate Route Frequency Ordered Stop   09/09/17 1800  piperacillin-tazobactam (ZOSYN) IVPB 3.375 g     3.375 g 12.5 mL/hr over  240 Minutes Intravenous Every 8 hours 09/09/17 1331     09/09/17 1300  cefTRIAXone (ROCEPHIN) 2 g in sodium chloride 0.9 % 100 mL IVPB  Status:  Discontinued     2 g 200 mL/hr over 30 Minutes Intravenous Every 24 hours 09/09/17 1146 09/09/17 1314   09/07/17 0000  piperacillin-tazobactam (ZOSYN) IVPB 3.375 g  Status:  Discontinued     3.375 g 12.5 mL/hr over 240 Minutes Intravenous Every 8 hours 09/06/17 2259 09/09/17 1146   09/06/17 1600  ceFEPIme (MAXIPIME) 2 g in sodium chloride 0.9 % 100 mL IVPB  Status:  Discontinued     2 g 200 mL/hr over 30 Minutes Intravenous  Once 09/06/17 1528 09/06/17 1539   09/06/17 1545  piperacillin-tazobactam (ZOSYN) IVPB 3.375 g     3.375 g 100 mL/hr over 30 Minutes Intravenous  Once 09/06/17 1543 09/06/17 1644        Objective:   Vitals:   09/15/17 1258 09/15/17 2028 09/16/17 0439 09/16/17 1304  BP: (!) 160/70 (!) 146/62 (!) 148/60 (!) 151/71  Pulse: 68 70 72 69  Resp: 12 18 14 16   Temp: 98.8 F (37.1 C) 98.8 F (37.1 C) 98.8 F (37.1 C) 99 F (37.2 C)  TempSrc: Oral Oral Oral Oral  SpO2: 95% 96% 95% 98%  Weight:      Height:        Wt Readings from Last 3 Encounters:  09/06/17 86.1 kg (189 lb 12.8 oz)  08/17/17 86.2 kg (190 lb)  06/29/17 84.8 kg (187 lb)     Intake/Output Summary (Last 24 hours) at 09/16/2017 1338 Last data filed at 09/16/2017 1300 Gross per 24 hour  Intake 2148.55 ml  Output 2100 ml  Net 48.55 ml     Physical Exam  Awake Alert, Oriented X 3, No new F.N deficits, Normal affect [place].AT,PERRAL Supple Neck,No JVD, No cervical lymphadenopathy appriciated.  Symmetrical Chest wall movement, Good air movement bilaterally, CTAB RRR,No Gallops,Rubs or new Murmurs, No Parasternal Heave +ve B.Sounds, Abd Soft, No tenderness, No organomegaly appriciated, No rebound - guarding or rigidity. No Cyanosis, Clubbing or edema, No new Rash or bruise   R TLC in place Gout improved    Data Review:    CBC Recent Labs  Lab  09/14/17 0427 09/15/17 0419 09/16/17 0357  WBC 19.2* 18.7* 17.7*  HGB 9.1* 9.3* 9.0*  HCT 28.8* 29.1* 28.6*  PLT 440* 414* 421*  MCV 88.3 88.7 90.2  MCH 27.9 28.4 28.4  MCHC 31.6 32.0 31.5  RDW 13.9 13.9 13.9  LYMPHSABS 2.6  --   --   MONOABS 0.9  --   --   EOSABS 0.5  --   --   BASOSABS 0.1  --   --     Chemistries  Recent Labs  Lab 09/12/17 0431 09/13/17 0432 09/14/17 0427 09/15/17 0419 09/16/17 0357  NA 141 141 139 141  141 141  141  K 3.9 4.0 4.1 4.2  4.2 4.3  4.3  CL 108 107 107 107  106 108  108  CO2 24 22 24 26  25 25  24   GLUCOSE 99 91 112* 118*  118* 106*  105*  BUN 30* 31* 37* 38*  38* 41*  41*  CREATININE 2.82* 2.72* 2.78* 3.02*  3.07* 2.80*  2.85*  CALCIUM 8.5* 8.9 8.6* 9.0  8.9 8.7*  8.7*  AST  --   --   --  25 21  ALT  --   --   --  36 31  ALKPHOS  --   --   --  81 70  BILITOT  --   --   --  0.6 0.6   ------------------------------------------------------------------------------------------------------------------ No results for input(s): CHOL, HDL, LDLCALC, TRIG, CHOLHDL, LDLDIRECT in the last 72 hours.  No results found for: HGBA1C ------------------------------------------------------------------------------------------------------------------ No results for input(s): TSH, T4TOTAL,  T3FREE, THYROIDAB in the last 72 hours.  Invalid input(s): FREET3 ------------------------------------------------------------------------------------------------------------------ No results for input(s): VITAMINB12, FOLATE, FERRITIN, TIBC, IRON, RETICCTPCT in the last 72 hours.  Coagulation profile Recent Labs  Lab 09/13/17 1748  INR 1.03    No results for input(s): DDIMER in the last 72 hours.  Cardiac Enzymes Recent Labs  Lab 09/12/17 0949 09/12/17 1657 09/12/17 2117  TROPONINI <0.03 <0.03 <0.03   ------------------------------------------------------------------------------------------------------------------ No results found for:  BNP  Inpatient Medications  Scheduled Meds: . alfuzosin  10 mg Oral Q breakfast  . amLODipine  5 mg Oral Daily  . cholecalciferol  2,000 Units Oral Daily  . colchicine  0.6 mg Oral Daily  . feeding supplement (NEPRO CARB STEADY)  237 mL Oral TID BM  . feeding supplement (PRO-STAT SUGAR FREE 64)  30 mL Oral BID  . finasteride  5 mg Oral Daily  . isosorbide mononitrate  30 mg Oral Daily  . labetalol  300 mg Oral BID  . levothyroxine  88 mcg Oral QAC breakfast  . multivitamin with minerals  1 tablet Oral Daily  . polyethylene glycol  17 g Oral Daily  . pravastatin  20 mg Oral Daily  . saccharomyces boulardii  250 mg Oral BID  . senna-docusate  1 tablet Oral BID   Continuous Infusions: . sodium chloride 10 mL/hr at 09/14/17 1245  . piperacillin-tazobactam (ZOSYN)  IV 3.375 g (09/16/17 0908)   PRN Meds:.acetaminophen **OR** acetaminophen, hydrALAZINE, menthol-cetylpyridinium, oxybutynin, oxyCODONE-acetaminophen, sodium chloride flush  Micro Results Recent Results (from the past 240 hour(s))  Blood culture (routine x 2)     Status: Abnormal   Collection Time: 09/06/17  3:48 PM  Result Value Ref Range Status   Specimen Description   Final    BLOOD LEFT WRIST Performed at [facility] [address] [place], [place] [address]    Special Requests   Final    BOTTLES DRAWN AEROBIC AND ANAEROBIC Blood Culture results may not be optimal due to an inadequate volume of blood received in culture bottles Performed at [facility] [address], [place], [place] [address]    Culture  Setup Time   Final    GRAM NEGATIVE RODS IN BOTH AEROBIC AND ANAEROBIC BOTTLES CRITICAL RESULT CALLED TO, READ BACK BY AND VERIFIED WITH: Damian Leavell [id number] MLM Performed at [facility], [address], [place], [place] [address]    Culture ESCHERICHIA COLI (A)  Final   Report Status 09/09/2017 FINAL  Final   Organism ID, Bacteria ESCHERICHIA COLI   Final      Susceptibility   Escherichia coli - MIC*    AMPICILLIN >=32 RESISTANT Resistant     CEFAZOLIN <=4 SENSITIVE Sensitive     CEFEPIME <=1 SENSITIVE Sensitive     CEFTAZIDIME <=1 SENSITIVE Sensitive     CEFTRIAXONE <=1 SENSITIVE Sensitive     CIPROFLOXACIN <=0.25 SENSITIVE Sensitive     GENTAMICIN <=1 SENSITIVE Sensitive     IMIPENEM <=0.25 SENSITIVE Sensitive     TRIMETH/SULFA <=20 SENSITIVE Sensitive     AMPICILLIN/SULBACTAM 16 INTERMEDIATE Intermediate     PIP/TAZO <=4 SENSITIVE Sensitive     Extended ESBL NEGATIVE Sensitive     * ESCHERICHIA COLI  Blood Culture ID Panel (Reflexed)     Status: Abnormal   Collection Time: 09/06/17  3:48 PM  Result Value Ref Range Status   Enterococcus species NOT DETECTED NOT DETECTED Final   Listeria monocytogenes NOT DETECTED NOT DETECTED  Final   Staphylococcus species NOT DETECTED NOT DETECTED Final   Staphylococcus aureus NOT DETECTED NOT DETECTED Final   Streptococcus species NOT DETECTED NOT DETECTED Final   Streptococcus agalactiae NOT DETECTED NOT DETECTED Final   Streptococcus pneumoniae NOT DETECTED NOT DETECTED Final   Streptococcus pyogenes NOT DETECTED NOT DETECTED Final   Acinetobacter baumannii NOT DETECTED NOT DETECTED Final   Enterobacteriaceae species DETECTED (A) NOT DETECTED Final    Comment: Enterobacteriaceae represent a large family of gram-negative bacteria, not a single organism. CRITICAL RESULT CALLED TO, READ BACK BY AND VERIFIED WITH: PHARMD J GADHIA [id number] MLM    Enterobacter cloacae complex NOT DETECTED NOT DETECTED Final   Escherichia coli DETECTED (A) NOT DETECTED Final    Comment: CRITICAL RESULT CALLED TO, READ BACK BY AND VERIFIED WITH: PHARMD J GADHIA [id number] MLM    Klebsiella oxytoca NOT DETECTED NOT DETECTED Final   Klebsiella pneumoniae NOT DETECTED NOT DETECTED Final   Proteus species NOT DETECTED NOT DETECTED Final   Serratia marcescens NOT DETECTED NOT DETECTED Final   Carbapenem  resistance NOT DETECTED NOT DETECTED Final   Haemophilus influenzae NOT DETECTED NOT DETECTED Final   Neisseria meningitidis NOT DETECTED NOT DETECTED Final   Pseudomonas aeruginosa NOT DETECTED NOT DETECTED Final   Candida albicans NOT DETECTED NOT DETECTED Final   Candida glabrata NOT DETECTED NOT DETECTED Final   Candida krusei NOT DETECTED NOT DETECTED Final   Candida parapsilosis NOT DETECTED NOT DETECTED Final   Candida tropicalis NOT DETECTED NOT DETECTED Final    Comment: Performed at [facility], [place] [address] [place], [place] [address]  Blood culture (routine x 2)     Status: None   Collection Time: 09/06/17  3:53 PM  Result Value Ref Range Status   Specimen Description   Final    BLOOD RIGHT WRIST Performed at [facility] [address] [place], [place] [address]    Special Requests   Final    BOTTLES DRAWN AEROBIC AND ANAEROBIC Blood Culture results may not be optimal due to an excessive volume of blood received in culture bottles Performed at [facility] [address] [place], [place] [address]    Culture   Final    NO GROWTH 5 DAYS Performed at [facility], [place] [address] [place], [place] [address]    Report Status 09/11/2017 FINAL  Final  Urine culture     Status: Abnormal   Collection Time: 09/06/17  7:24 PM  Result Value Ref Range Status   Specimen Description   Final    URINE, CLEAN CATCH Performed at [facility], [place] [address] [place], [place] [address]    Special Requests   Final    Normal Performed at [facility], [place] [address] [place], [place] [address]    Culture (A)  Final    60,000 COLONIES/mL ESCHERICHIA COLI 60,000 COLONIES/mL ENTEROCOCCUS FAECALIS    Report Status 09/10/2017 FINAL  Final   Organism ID, Bacteria ESCHERICHIA COLI (A)  Final   Organism ID, Bacteria ENTEROCOCCUS FAECALIS (A)  Final      Susceptibility   Escherichia coli - MIC*     AMPICILLIN >=32 RESISTANT Resistant     CEFAZOLIN <=4 SENSITIVE Sensitive     CEFTRIAXONE <=1 SENSITIVE Sensitive     CIPROFLOXACIN <=0.25 SENSITIVE Sensitive     GENTAMICIN <=1 SENSITIVE Sensitive     IMIPENEM <=0.25 SENSITIVE Sensitive  NITROFURANTOIN <=16 SENSITIVE Sensitive     TRIMETH/SULFA <=20 SENSITIVE Sensitive     AMPICILLIN/SULBACTAM 16 INTERMEDIATE Intermediate     PIP/TAZO <=4 SENSITIVE Sensitive     Extended ESBL NEGATIVE Sensitive     * 60,000 COLONIES/mL ESCHERICHIA COLI   Enterococcus faecalis - MIC*    AMPICILLIN <=2 SENSITIVE Sensitive     LEVOFLOXACIN >=8 RESISTANT Resistant     NITROFURANTOIN <=16 SENSITIVE Sensitive     VANCOMYCIN 1 SENSITIVE Sensitive     * 60,000 COLONIES/mL ENTEROCOCCUS FAECALIS  Culture, blood (routine x 2)     Status: None   Collection Time: 09/08/17  5:42 AM  Result Value Ref Range Status   Specimen Description   Final    BLOOD RIGHT ANTECUBITAL Performed at [facility] [address], [place], [place] [address]    Special Requests   Final    BOTTLES DRAWN AEROBIC AND ANAEROBIC Blood Culture adequate volume Performed at [facility] [address] [place], [place] [address]    Culture   Final    NO GROWTH 5 DAYS Performed at [facility], [place] [address] [place], [place] [address]    Report Status 09/13/2017 FINAL  Final  Culture, blood (routine x 2)     Status: None   Collection Time: 09/08/17  5:42 AM  Result Value Ref Range Status   Specimen Description   Final    BLOOD RIGHT HAND Performed at [facility] [address], [place], [place] [address]    Special Requests   Final    BOTTLES DRAWN AEROBIC AND ANAEROBIC Blood Culture adequate volume Performed at [facility] [address] [place], [place] [address]    Culture   Final    NO GROWTH 5 DAYS Performed at [facility], [place] [address] [place],  [address]     Report Status 09/13/2017 FINAL  Final    Radiology Reports Dg Chest 2 View  Result Date: 09/12/2017 CLINICAL DATA:  Medial chest pain.  Shortness of breath. EXAM: CHEST - 2 VIEW COMPARISON:  Chest x-rays dated 02/06/2016 and 09/15/2015. FINDINGS: Today's study is slightly hypoinspiratory with crowding of the perihilar and bibasilar bronchovascular markings. New subtle opacity is seen within the periphery of the RIGHT upper lung, suspicious for developing pneumonia. There is also a new dense opacity at the posterior costophrenic angle on the lateral projection, most likely LEFT lower lobe, consistent with additional pneumonia, atelectasis and/or small pleural effusion. Questionable small RIGHT pleural effusion. No pneumothorax or large pleural effusion seen. Heart size and mediastinal contours are likely stable given the low lung volumes. IMPRESSION: 1. Subtle opacity within the RIGHT upper lung, suspicious for early developing pneumonia. Recommend follow-up chest x-ray to ensure resolution. 2. Additional dense opacity at the posterior costophrenic angle on the lateral view, most likely LEFT lower lobe, consistent with pneumonia, atelectasis and/or small pleural effusion. 3. Questionable small RIGHT pleural effusion. Electronically Signed   By: Franki Cabot M.D.   On: 09/12/2017 10:35   Nm Pulmonary Perf And Vent  Result Date: 09/12/2017 CLINICAL DATA:  Left side chest pain. EXAM: NUCLEAR MEDICINE VENTILATION - PERFUSION LUNG SCAN TECHNIQUE: Ventilation images were obtained in multiple projections using inhaled aerosol Tc-66m DTPA. Perfusion images were obtained in multiple projections after intravenous injection of Tc-60m-MAA. RADIOPHARMACEUTICALS:  26.5 mCi of Tc-55m DTPA aerosol inhalation and 3.8 mCi Tc89m-MAA IV COMPARISON:  Chest x-ray earlier today FINDINGS: Ventilation: Patchy nonsegmental ventilation  defects Perfusion: Patchy nonsegmental matched perfusion defects. IMPRESSION: Patchy nonsegmental  matched VQ defects. No mismatched defects. Study is low probability for pulmonary embolus. Electronically Signed   By: Rolm Baptise M.D.   On: 09/12/2017 18:48   Ir Fluoro Guide Cv Line Right  Result Date: 09/14/2017 CLINICAL DATA:  Emphysematous cystitis, E coli bacteremia and need for tunneled central venous catheter for IV antibiotic therapy. Need to preserve upper extremity veins due to underlying significant chronic kidney disease. EXAM: TUNNELED CENTRAL VENOUS CATHETER PLACEMENT WITH ULTRASOUND AND FLUOROSCOPIC GUIDANCE ANESTHESIA/SEDATION: 1.0 mg IV Versed; 50 mcg IV Fentanyl. Total Moderate Sedation Time:   13 minutes. The patient's level of consciousness and physiologic status were continuously monitored during the procedure by Radiology nursing. MEDICATIONS: None FLUOROSCOPY TIME:  6 seconds.  1.3 mGy. PROCEDURE: The procedure, risks, benefits, and alternatives were explained to the patient. Questions regarding the procedure were encouraged and answered. The patient understands and consents to the procedure. A timeout was performed prior to initiating the procedure. Ultrasound was utilized to confirm patency of the right internal jugular vein. The right neck and chest were prepped with chlorhexidine in a sterile fashion, and a sterile drape was applied covering the operative field. Maximum barrier sterile technique with sterile gowns and gloves were used for the procedure. Local anesthesia was provided with 1% lidocaine. After creating a small venotomy incision, a 21 gauge needle was advanced into the right internal jugular vein under direct, real-time ultrasound guidance. Ultrasound image documentation was performed. After securing guidewire access, 6 French peel-away sheath was placed. A wire was utilized to estimate catheter length. A 6 French, dual-lumen power line tunneled catheter was chosen for placement. This was tunneled in a retrograde fashion from the chest wall to the venotomy incision. The  catheter was cut to 26 cm based on guidewire measurement. The catheter was placed through the peel-away sheath and the sheath removed. The venotomy incision was closed with subcutaneous subcuticular 4-0 Vicryl. Dermabond was applied to the incision. The catheter exit site was secured Prolene and Ethilon retention sutures. COMPLICATIONS: None.  No pneumothorax. FINDINGS: After catheter placement, the tip lies at the SVC/RA junction. The catheter aspirates normally and is ready for immediate use. IMPRESSION: Placement of tunneled central venous catheter via the right internal jugular vein. The catheter tip lies at the SVC/RA junction. The catheter is ready for immediate use. Electronically Signed   By: Aletta Edouard M.D.   On: 09/14/2017 12:03   Ir US Guide Vasc Access Right  Result Date: 09/14/2017 CLINICAL DATA:  Emphysematous cystitis, E coli bacteremia and need for tunneled central venous catheter for IV antibiotic therapy. Need to preserve upper extremity veins due to underlying significant chronic kidney disease. EXAM: TUNNELED CENTRAL VENOUS CATHETER PLACEMENT WITH ULTRASOUND AND FLUOROSCOPIC GUIDANCE ANESTHESIA/SEDATION: 1.0 mg IV Versed; 50 mcg IV Fentanyl. Total Moderate Sedation Time:   13 minutes. The patient's level of consciousness and physiologic status were continuously monitored during the procedure by Radiology nursing. MEDICATIONS: None FLUOROSCOPY TIME:  6 seconds.  1.3 mGy. PROCEDURE: The procedure, risks, benefits, and alternatives were explained to the patient. Questions regarding the procedure were encouraged and answered. The patient understands and consents to the procedure. A timeout was performed prior to initiating the procedure. Ultrasound was utilized to confirm patency of the right internal jugular vein. The right neck and chest were prepped with chlorhexidine in a sterile fashion, and a sterile drape was applied covering the operative field. Maximum barrier sterile technique with  sterile  gowns and gloves were used for the procedure. Local anesthesia was provided with 1% lidocaine. After creating a small venotomy incision, a 21 gauge needle was advanced into the right internal jugular vein under direct, real-time ultrasound guidance. Ultrasound image documentation was performed. After securing guidewire access, 6 French peel-away sheath was placed. A wire was utilized to estimate catheter length. A 6 French, dual-lumen power line tunneled catheter was chosen for placement. This was tunneled in a retrograde fashion from the chest wall to the venotomy incision. The catheter was cut to 26 cm based on guidewire measurement. The catheter was placed through the peel-away sheath and the sheath removed. The venotomy incision was closed with subcutaneous subcuticular 4-0 Vicryl. Dermabond was applied to the incision. The catheter exit site was secured Prolene and Ethilon retention sutures. COMPLICATIONS: None.  No pneumothorax. FINDINGS: After catheter placement, the tip lies at the SVC/RA junction. The catheter aspirates normally and is ready for immediate use. IMPRESSION: Placement of tunneled central venous catheter via the right internal jugular vein. The catheter tip lies at the SVC/RA junction. The catheter is ready for immediate use. Electronically Signed   By: Aletta Edouard M.D.   On: 09/14/2017 12:03    Time Spent in minutes  30   Jani Gravel M.D on 09/16/2017 at 1:38 PM  Between 7am to 7pm - Pager - [phone]    After 7pm go to [email or web address] - password [facility]  [facility] -  Office  [phone]

## 2017-09-16 NOTE — Progress Notes (Signed)
Physical Therapy Treatment Patient Details Name: Kenneth Williams MRN: [id number] DOB: 1935/10/21 Today's Date: 09/16/2017    History of Present Illness 82 yo male with PMH of BPH, hypertension, TIA, CKD stage III; admitted on 09/06/2017, presented with complaint of hematuria, was found to have sepsis secondary to E. coli UTI.    PT Comments    Pt initially not agreeable to ambulate due to gout pain, declines using RW however mentioned "scooter thing."  Returned to room with knee scooter so pt could mobilize with decreased pain.  Pt reports generalized weakness during mobility.    Follow Up Recommendations  Home health PT     Equipment Recommendations  None recommended by PT    Recommendations for Other Services       Precautions / Restrictions Precautions Precautions: Fall Restrictions Weight Bearing Restrictions: No    Mobility  Bed Mobility Overal bed mobility: Modified Independent                Transfers Overall transfer level: Needs assistance Equipment used: None Transfers: Sit to/from Stand Sit to Stand: Min guard         General transfer comment: min/guard for safety, demonstrated decreased weight bearing on L LE due to pain from "gout"  Ambulation/Gait Ambulation/Gait assistance: Min guard Gait Distance (Feet): 400 Feet Assistive device: (knee scooter)       General Gait Details: pt not agreeable to use RW so provided knee scooter for pt to mobilize, min/guard for safety   Stairs             Wheelchair Mobility    Modified Rankin (Stroke Patients Only)       Balance                                            Cognition Arousal/Alertness: Awake/alert Behavior During Therapy: WFL for tasks assessed/performed Overall Cognitive Status: Within Functional Limits for tasks assessed                                        Exercises      General Comments        Pertinent Vitals/Pain Pain  Assessment: No/denies pain Pain Location: L foot/ankle pain with WBing, none at rest Pain Intervention(s): Repositioned    Home Living                      Prior Function            PT Goals (current goals can now be found in the care plan section) Progress towards PT goals: Progressing toward goals    Frequency    Min 3X/week      PT Plan Current plan remains appropriate    Co-evaluation              AM-PAC PT "6 Clicks" Daily Activity  Outcome Measure  Difficulty turning over in bed (including adjusting bedclothes, sheets and blankets)?: None Difficulty moving from lying on back to sitting on the side of the bed? : None Difficulty sitting down on and standing up from a chair with arms (e.g., wheelchair, bedside commode, etc,.)?: A Little Help needed moving to and from a bed to chair (including a wheelchair)?: A Little Help needed walking in hospital room?: A Little  Help needed climbing 3-5 steps with a railing? : A Little 6 Click Score: 20    End of Session   Activity Tolerance: Patient limited by pain Patient left: in bed;with call bell/phone within reach Nurse Communication: Mobility status(observed pt on knee scooter in hallway) PT Visit Diagnosis: Difficulty in walking, not elsewhere classified (R26.2)     Time: 1400-1420 PT Time Calculation (min) (ACUTE ONLY): 20 min  Charges:  $Gait Training: 8-22 mins                     Carmelia Bake, PT, DPT 09/16/2017 Pager: [phone]  York Ram E 09/16/2017, 3:30 PM

## 2017-09-17 DIAGNOSIS — B9620 Unspecified Escherichia coli [E. coli] as the cause of diseases classified elsewhere: Secondary | ICD-10-CM

## 2017-09-17 DIAGNOSIS — R7881 Bacteremia: Secondary | ICD-10-CM

## 2017-09-17 LAB — RENAL FUNCTION PANEL
ALBUMIN: 2.4 g/dL — AB (ref 3.5–5.0)
Anion gap: 10 (ref 5–15)
BUN: 53 mg/dL — ABNORMAL HIGH (ref 8–23)
CO2: 23 mmol/L (ref 22–32)
CREATININE: 3.21 mg/dL — AB (ref 0.61–1.24)
Calcium: 9 mg/dL (ref 8.9–10.3)
Chloride: 108 mmol/L (ref 98–111)
GFR calc Af Amer: 20 mL/min — ABNORMAL LOW (ref >60)
GFR calc non Af Amer: 17 mL/min — ABNORMAL LOW (ref >60)
GLUCOSE: 104 mg/dL — AB (ref 70–99)
Phosphorus: 4.4 mg/dL (ref 2.5–4.6)
Potassium: 4.5 mmol/L (ref 3.5–5.1)
SODIUM: 141 mmol/L (ref 135–145)

## 2017-09-17 NOTE — Plan of Care (Signed)
Pt WBC count is at 17.

## 2017-09-17 NOTE — Progress Notes (Signed)
Patient ID: [name], male   DOB: 1935/10/07, 82 y.o.   MRN: [id number]                                                                PROGRESS NOTE                                                                                                                                                                                                             Patient Demographics:    Kenneth Williams, is a 82 y.o. male, DOB - 13-Sep-1935, WEX:[id number]  Admit date - 09/06/2017   Admitting Physician Kenneth Aloe, MD  Outpatient Primary MD for the patient is Plotnikov, Evie Lacks, MD  LOS - 11  Outpatient Specialists:     Chief Complaint  Patient presents with  . Urinary Tract Infection       Brief Narrative     82 y.o.malewith medical history significant ofBPH, hypertension, TIA, CKD stage III.Patient reports a one-week history of gross hematuria. He has persistent symptoms of dysuria, hesitancy, frequency. He self caths secondary to urinary retention. He reports gross hematuria with clots that have improved over the past few days, although he has had associated fever, chills, muscle aches. He stopped self cathing after he started having symptoms of hematuria. He did not try anything else to help with his symptoms. 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  ED Course: Vitals:Afebrile, slightly hypertensive, normal pulse, normal respirations Labs:Glucose of 115, BUN of 41, creatinine of 3.01, WBC of 15.2K Imaging:None obtained. Per discussion with the EDP and urologist on-call, evidence of emphysematous cystitis on outpatient CT scan Medications/Course:Zosyn given. Blood cultures obtained prior to antibiotic therapy. Urine cultures obtained after antibiotic therapy.     Subjective:    Kenneth Williams today states that gout is getting better. Afebrile.  Mild worsening of renal  insufficiency, urine output good.   No headache, No chest pain, No abdominal pain - No Nausea, No new weakness tingling or numbness, No Cough - SOB.    Assessment  & Plan :    Active Problems:   Hypothyroidism   TIA (transient ischemic attack)   Carotid artery stenosis   BPH (benign prostatic hyperplasia)  Gout   UTI (urinary tract infection)   Pyelonephritis    Leukocytosis, still high, slowly improving Cont currently abx, Repeat cbc in am   Sepsis secondary to E. coli bacteremia and E. coli and enterococcus UTI. Hematuria. Indwelling Foley catheter. Chronic urinary retention. Persistent Leukocytosis Urology consulted. Urine culture performed in the clinic. Blood culture performed here is positive for E. coli.Repeat culture is negative. Urine culture performed in the clinic showed E. coli as well asunidentified organism. Urine culture performed here in the hospital is showing E. coli and enterococcus. Blood cultures growing E. coli only Difficult choices, enterococcus is resistant to levofloxacin and E. coli is resistance to ampicillin and Unasyn. no simple, single oral antibiotic choice available at present.  Patient had catheter leakage and bladder spasm, catheter was changed on 09/08/2017 again, following which symptoms are resolved. Highly appreciate all the urologist involved in patient's care. Urologist does not anticipate any inpatient procedure. Patient will discharge home with Foley catheter in place. Appreciate ID input as well. Currently on IV Zosyn. Plan is to complete a total of 14 days of treatment with IV Zosyn.  Due to his CKD status IR guided line recommended and consult placed.  Central line placed 7/24  Chest pain. Reported some chest pain this morning. EKG known ischemic. Troponins are negative so far. Monitor serial troponin. Continue monitoring on telemetry.Telemetry suggest SVT, continue beta-blocker. D-dimer is mildly elevated  and patient has been in the hospital and therefore we will check a VQ scan. Chest x-ray made a comment regarding possible bilateral pneumonia although patient is on antibiotic and I do not suspect that the patient actually has an active infection but patient may benefit from incentive spirometry. Pain medication change from tramadol to Percocet. Currently pain-free.  Acute kidney injury on CKD stage III Function getting better. Continue close monitoring encourage p.o. Fluids. Renal function getting better after replacement of the Foley catheter. Encouraging p.o. fluids. Discussed with nephrology, recommend PCP to refer outpatient for establishing care with nephrologist. Not a candidate for PICC line or midline. TLC placed 7/24 Colchicine might be affecting his renal function, will monitor Check cmp in am   Hypothyroidism -Continue Synthroid  BPH Contributing to current presentation -Continue finasteride and alfuzosin  Hypertension Uncontrolled. -Continue home amlodipineand labetalol -Hydralazine prn  History of dyslipidemia -Continue Pravastatin  Constipation and poor p.o. intake. Continue stool softeners.  Severe protein calorie malnutrition Start on Prostat 10mL po bid  Gout attack Started colchicine 7/25   Diet: car modified diet DVT Prophylaxis:Heparin  Advance goals of care discussion:DNR DNI  Family Communication:no family was present at bedside, at the time of interview.   Disposition: Discharge to home, PT consulted recommend no PT  Consultants:urology, infectious disease Procedures: none  Barrier to Discharge: white count elevation       Anti-infectives (From admission, onward)   Start     Dose/Rate Route Frequency Ordered Stop   09/09/17 1800  piperacillin-tazobactam (ZOSYN) IVPB 3.375 g     3.375 g 12.5 mL/hr over 240 Minutes Intravenous Every 8 hours 09/09/17 1331     09/09/17 1300  cefTRIAXone (ROCEPHIN) 2 g in  sodium chloride 0.9 % 100 mL IVPB  Status:  Discontinued     2 g 200 mL/hr over 30 Minutes Intravenous Every 24 hours 09/09/17 1146 09/09/17 1314   09/07/17 0000  piperacillin-tazobactam (ZOSYN) IVPB 3.375 g  Status:  Discontinued     3.375 g 12.5 mL/hr over 240 Minutes Intravenous Every 8 hours 09/06/17 2259 09/09/17  1146   09/06/17 1600  ceFEPIme (MAXIPIME) 2 g in sodium chloride 0.9 % 100 mL IVPB  Status:  Discontinued     2 g 200 mL/hr over 30 Minutes Intravenous  Once 09/06/17 1528 09/06/17 1539   09/06/17 1545  piperacillin-tazobactam (ZOSYN) IVPB 3.375 g     3.375 g 100 mL/hr over 30 Minutes Intravenous  Once 09/06/17 1543 09/06/17 1644        Objective:   Vitals:   09/16/17 0439 09/16/17 1304 09/16/17 2043 09/17/17 0556  BP: (!) 148/60 (!) 151/71 136/68 (!) 171/71  Pulse: 72 69 64 68  Resp: 14 16 16 18   Temp: 98.8 F (37.1 C) 99 F (37.2 C) 98.2 F (36.8 C) 98.3 F (36.8 C)  TempSrc: Oral Oral Oral Oral  SpO2: 95% 98% 96% 98%  Weight:      Height:        Wt Readings from Last 3 Encounters:  09/06/17 86.1 kg (189 lb 12.8 oz)  08/17/17 86.2 kg (190 lb)  06/29/17 84.8 kg (187 lb)     Intake/Output Summary (Last 24 hours) at 09/17/2017 1040 Last data filed at 09/17/2017 0934 Gross per 24 hour  Intake 944 ml  Output 2775 ml  Net -1831 ml     Physical Exam  Awake Alert, Oriented X 3, No new F.N deficits, Normal affect Kenneth Williams.AT,PERRAL Supple Neck,No JVD, No cervical lymphadenopathy appriciated.  Symmetrical Chest wall movement, Good air movement bilaterally, CTAB RRR,No Gallops,Rubs or new Murmurs, No Parasternal Heave +ve B.Sounds, Abd Soft, No tenderness, No organomegaly appriciated, No rebound - guarding or rigidity. No Cyanosis, Clubbing or edema, No new Rash or bruise   Left toe seems better from gout    Data Review:    CBC Recent Labs  Lab 09/14/17 0427 09/15/17 0419 09/16/17 0357  WBC 19.2* 18.7* 17.7*  HGB 9.1* 9.3* 9.0*  HCT 28.8* 29.1*  28.6*  PLT 440* 414* 421*  MCV 88.3 88.7 90.2  MCH 27.9 28.4 28.4  MCHC 31.6 32.0 31.5  RDW 13.9 13.9 13.9  LYMPHSABS 2.6  --   --   MONOABS 0.9  --   --   EOSABS 0.5  --   --   BASOSABS 0.1  --   --     Chemistries  Recent Labs  Lab 09/13/17 0432 09/14/17 0427 09/15/17 0419 09/16/17 0357 09/17/17 0449  NA 141 139 141  141 141  141 141  K 4.0 4.1 4.2  4.2 4.3  4.3 4.5  CL 107 107 107  106 108  108 108  CO2 22 24 26  25 25  24 23   GLUCOSE 91 112* 118*  118* 106*  105* 104*  BUN 31* 37* 38*  38* 41*  41* 53*  CREATININE 2.72* 2.78* 3.02*  3.07* 2.80*  2.85* 3.21*  CALCIUM 8.9 8.6* 9.0  8.9 8.7*  8.7* 9.0  AST  --   --  25 21  --   ALT  --   --  36 31  --   ALKPHOS  --   --  81 70  --   BILITOT  --   --  0.6 0.6  --    ------------------------------------------------------------------------------------------------------------------ No results for input(s): CHOL, HDL, LDLCALC, TRIG, CHOLHDL, LDLDIRECT in the last 72 hours.  No results found for: HGBA1C ------------------------------------------------------------------------------------------------------------------ No results for input(s): TSH, T4TOTAL, T3FREE, THYROIDAB in the last 72 hours.  Invalid input(s): FREET3 ------------------------------------------------------------------------------------------------------------------ No results for input(s): VITAMINB12, FOLATE, FERRITIN, TIBC, IRON, RETICCTPCT  in the last 72 hours.  Coagulation profile Recent Labs  Lab 09/13/17 1748  INR 1.03    No results for input(s): DDIMER in the last 72 hours.  Cardiac Enzymes Recent Labs  Lab 09/12/17 0949 09/12/17 1657 09/12/17 2117  TROPONINI <0.03 <0.03 <0.03   ------------------------------------------------------------------------------------------------------------------ No results found for: BNP  Inpatient Medications  Scheduled Meds: . alfuzosin  10 mg Oral Q breakfast  . amLODipine  5 mg Oral  Daily  . cholecalciferol  2,000 Units Oral Daily  . colchicine  0.6 mg Oral Daily  . feeding supplement (NEPRO CARB STEADY)  237 mL Oral TID BM  . feeding supplement (PRO-STAT SUGAR FREE 64)  30 mL Oral BID  . finasteride  5 mg Oral Daily  . isosorbide mononitrate  30 mg Oral Daily  . labetalol  300 mg Oral BID  . levothyroxine  88 mcg Oral QAC breakfast  . multivitamin with minerals  1 tablet Oral Daily  . polyethylene glycol  17 g Oral Daily  . pravastatin  20 mg Oral Daily  . saccharomyces boulardii  250 mg Oral BID  . senna-docusate  1 tablet Oral BID   Continuous Infusions: . sodium chloride 10 mL/hr at 09/14/17 1245  . piperacillin-tazobactam (ZOSYN)  IV 3.375 g (09/17/17 1000)   PRN Meds:.acetaminophen **OR** acetaminophen, hydrALAZINE, menthol-cetylpyridinium, oxybutynin, oxyCODONE-acetaminophen, sodium chloride flush  Micro Results Recent Results (from the past 240 hour(s))  Culture, blood (routine x 2)     Status: None   Collection Time: 09/08/17  5:42 AM  Result Value Ref Range Status   Specimen Description   Final    BLOOD RIGHT ANTECUBITAL Performed at [facility] [address], [place], [place] [address]    Special Requests   Final    BOTTLES DRAWN AEROBIC AND ANAEROBIC Blood Culture adequate volume Performed at [facility] [address], [place], [place] [address]    Culture   Final    NO GROWTH 5 DAYS Performed at [facility], [place] [address] [place], [place] [address]    Report Status 09/13/2017 FINAL  Final  Culture, blood (routine x 2)     Status: None   Collection Time: 09/08/17  5:42 AM  Result Value Ref Range Status   Specimen Description   Final    BLOOD RIGHT HAND Performed at [facility] [address] [place], [place] [address]    Special Requests   Final    BOTTLES DRAWN AEROBIC AND ANAEROBIC Blood Culture adequate volume Performed at [facility] [address], [place], [place] [address]    Culture   Final    NO GROWTH 5 DAYS Performed at [facility], [place] [address], [place], [place] [address]    Report Status 09/13/2017 FINAL  Final    Radiology Reports Dg Chest 2 View  Result Date: 09/12/2017 CLINICAL DATA:  Medial chest pain.  Shortness of breath. EXAM: CHEST - 2 VIEW COMPARISON:  Chest x-rays dated 02/06/2016 and 09/15/2015. FINDINGS: Today's study is slightly hypoinspiratory with crowding of the perihilar and bibasilar bronchovascular markings. New subtle opacity is seen within the periphery of the RIGHT upper lung, suspicious for developing pneumonia. There is also a new dense opacity at the posterior costophrenic angle on the lateral projection, most likely LEFT lower lobe, consistent with additional pneumonia, atelectasis and/or small pleural effusion. Questionable small RIGHT pleural effusion. No pneumothorax or large pleural effusion seen. Heart size and  mediastinal contours are likely stable given the low lung volumes. IMPRESSION: 1. Subtle opacity within the RIGHT upper lung, suspicious for early developing pneumonia. Recommend follow-up chest x-ray to ensure resolution. 2. Additional dense opacity at the posterior costophrenic angle on the lateral view, most likely LEFT lower lobe, consistent with pneumonia, atelectasis and/or small pleural effusion. 3. Questionable small RIGHT pleural effusion. Electronically Signed   By: Franki Cabot M.D.   On: 09/12/2017 10:35   Nm Pulmonary Perf And Vent  Result Date: 09/12/2017 CLINICAL DATA:  Left side chest pain. EXAM: NUCLEAR MEDICINE VENTILATION - PERFUSION LUNG SCAN TECHNIQUE: Ventilation images were obtained in multiple projections using inhaled aerosol Tc-68m DTPA. Perfusion images were obtained in multiple projections after intravenous injection of Tc-74m-MAA. RADIOPHARMACEUTICALS:  26.5 mCi of Tc-59m DTPA aerosol inhalation and 3.8 mCi Tc58m-MAA IV COMPARISON:  Chest x-ray  earlier today FINDINGS: Ventilation: Patchy nonsegmental ventilation defects Perfusion: Patchy nonsegmental matched perfusion defects. IMPRESSION: Patchy nonsegmental matched VQ defects. No mismatched defects. Study is low probability for pulmonary embolus. Electronically Signed   By: Rolm Baptise M.D.   On: 09/12/2017 18:48   Ir Fluoro Guide Cv Line Right  Result Date: 09/14/2017 CLINICAL DATA:  Emphysematous cystitis, E coli bacteremia and need for tunneled central venous catheter for IV antibiotic therapy. Need to preserve upper extremity veins due to underlying significant chronic kidney disease. EXAM: TUNNELED CENTRAL VENOUS CATHETER PLACEMENT WITH ULTRASOUND AND FLUOROSCOPIC GUIDANCE ANESTHESIA/SEDATION: 1.0 mg IV Versed; 50 mcg IV Fentanyl. Total Moderate Sedation Time:   13 minutes. The patient's level of consciousness and physiologic status were continuously monitored during the procedure by Radiology nursing. MEDICATIONS: None FLUOROSCOPY TIME:  6 seconds.  1.3 mGy. PROCEDURE: The procedure, risks, benefits, and alternatives were explained to the patient. Questions regarding the procedure were encouraged and answered. The patient understands and consents to the procedure. A timeout was performed prior to initiating the procedure. Ultrasound was utilized to confirm patency of the right internal jugular vein. The right neck and chest were prepped with chlorhexidine in a sterile fashion, and a sterile drape was applied covering the operative field. Maximum barrier sterile technique with sterile gowns and gloves were used for the procedure. Local anesthesia was provided with 1% lidocaine. After creating a small venotomy incision, a 21 gauge needle was advanced into the right internal jugular vein under direct, real-time ultrasound guidance. Ultrasound image documentation was performed. After securing guidewire access, 6 French peel-away sheath was placed. A wire was utilized to estimate catheter length. A  6 French, dual-lumen power line tunneled catheter was chosen for placement. This was tunneled in a retrograde fashion from the chest wall to the venotomy incision. The catheter was cut to 26 cm based on guidewire measurement. The catheter was placed through the peel-away sheath and the sheath removed. The venotomy incision was closed with subcutaneous subcuticular 4-0 Vicryl. Dermabond was applied to the incision. The catheter exit site was secured Prolene and Ethilon retention sutures. COMPLICATIONS: None.  No pneumothorax. FINDINGS: After catheter placement, the tip lies at the SVC/RA junction. The catheter aspirates normally and is ready for immediate use. IMPRESSION: Placement of tunneled central venous catheter via the right internal jugular vein. The catheter tip lies at the SVC/RA junction. The catheter is ready for immediate use. Electronically Signed   By: Aletta Edouard M.D.   On: 09/14/2017 12:03   Ir US Guide Vasc Access Right  Result Date: 09/14/2017 CLINICAL DATA:  Emphysematous cystitis, E coli bacteremia and need for tunneled central  venous catheter for IV antibiotic therapy. Need to preserve upper extremity veins due to underlying significant chronic kidney disease. EXAM: TUNNELED CENTRAL VENOUS CATHETER PLACEMENT WITH ULTRASOUND AND FLUOROSCOPIC GUIDANCE ANESTHESIA/SEDATION: 1.0 mg IV Versed; 50 mcg IV Fentanyl. Total Moderate Sedation Time:   13 minutes. The patient's level of consciousness and physiologic status were continuously monitored during the procedure by Radiology nursing. MEDICATIONS: None FLUOROSCOPY TIME:  6 seconds.  1.3 mGy. PROCEDURE: The procedure, risks, benefits, and alternatives were explained to the patient. Questions regarding the procedure were encouraged and answered. The patient understands and consents to the procedure. A timeout was performed prior to initiating the procedure. Ultrasound was utilized to confirm patency of the right internal jugular vein. The right  neck and chest were prepped with chlorhexidine in a sterile fashion, and a sterile drape was applied covering the operative field. Maximum barrier sterile technique with sterile gowns and gloves were used for the procedure. Local anesthesia was provided with 1% lidocaine. After creating a small venotomy incision, a 21 gauge needle was advanced into the right internal jugular vein under direct, real-time ultrasound guidance. Ultrasound image documentation was performed. After securing guidewire access, 6 French peel-away sheath was placed. A wire was utilized to estimate catheter length. A 6 French, dual-lumen power line tunneled catheter was chosen for placement. This was tunneled in a retrograde fashion from the chest wall to the venotomy incision. The catheter was cut to 26 cm based on guidewire measurement. The catheter was placed through the peel-away sheath and the sheath removed. The venotomy incision was closed with subcutaneous subcuticular 4-0 Vicryl. Dermabond was applied to the incision. The catheter exit site was secured Prolene and Ethilon retention sutures. COMPLICATIONS: None.  No pneumothorax. FINDINGS: After catheter placement, the tip lies at the SVC/RA junction. The catheter aspirates normally and is ready for immediate use. IMPRESSION: Placement of tunneled central venous catheter via the right internal jugular vein. The catheter tip lies at the SVC/RA junction. The catheter is ready for immediate use. Electronically Signed   By: Aletta Edouard M.D.   On: 09/14/2017 12:03    Time Spent in minutes  30   Jani Gravel M.D on 09/17/2017 at 10:40 AM  Between 7am to 7pm - Pager - [phone]    After 7pm go to [email or web address] - password [facility]  [facility] -  Office  [phone]

## 2017-09-18 LAB — COMPREHENSIVE METABOLIC PANEL
ALBUMIN: 2.3 g/dL — AB (ref 3.5–5.0)
ALK PHOS: 67 U/L (ref 38–126)
ALT: 28 U/L (ref 0–44)
ANION GAP: 7 (ref 5–15)
AST: 21 U/L (ref 15–41)
BILIRUBIN TOTAL: 0.2 mg/dL — AB (ref 0.3–1.2)
BUN: 54 mg/dL — AB (ref 8–23)
CALCIUM: 9.1 mg/dL (ref 8.9–10.3)
CO2: 27 mmol/L (ref 22–32)
Chloride: 107 mmol/L (ref 98–111)
Creatinine, Ser: 3.13 mg/dL — ABNORMAL HIGH (ref 0.61–1.24)
GFR calc Af Amer: 20 mL/min — ABNORMAL LOW (ref >60)
GFR calc non Af Amer: 17 mL/min — ABNORMAL LOW (ref >60)
Glucose, Bld: 101 mg/dL — ABNORMAL HIGH (ref 70–99)
Potassium: 4.4 mmol/L (ref 3.5–5.1)
Sodium: 141 mmol/L (ref 135–145)
TOTAL PROTEIN: 6.1 g/dL — AB (ref 6.5–8.1)

## 2017-09-18 LAB — CBC
HEMATOCRIT: 30.1 % — AB (ref 39.0–52.0)
HEMOGLOBIN: 9.5 g/dL — AB (ref 13.0–17.0)
MCH: 28.2 pg (ref 26.0–34.0)
MCHC: 31.6 g/dL (ref 30.0–36.0)
MCV: 89.3 fL (ref 78.0–100.0)
Platelets: 432 10*3/uL — ABNORMAL HIGH (ref 150–400)
RBC: 3.37 MIL/uL — ABNORMAL LOW (ref 4.22–5.81)
RDW: 14 % (ref 11.5–15.5)
WBC: 14.2 10*3/uL — ABNORMAL HIGH (ref 4.0–10.5)

## 2017-09-18 LAB — PHOSPHORUS: PHOSPHORUS: 4.4 mg/dL (ref 2.5–4.6)

## 2017-09-18 MED ORDER — COLCHICINE 0.6 MG PO TABS: 0.6000 mg | ORAL_TABLET | Freq: Every day | ORAL | Status: DC | PRN

## 2017-09-18 NOTE — Progress Notes (Signed)
Patient ID: Kenneth Williams, male   DOB: 1935/10/09, 82 y.o.   MRN: [id number]                                                                PROGRESS NOTE                                                                                                                                                                                                             Patient Demographics:    Kenneth Williams, is a 82 y.o. male, DOB - 09/02/1935, ION:[id number]  Admit date - 09/06/2017   Admitting Physician Mariel Aloe, MD  Outpatient Primary MD for the patient is Plotnikov, Evie Lacks, MD  LOS - 12  Outpatient Specialists:     Chief Complaint  Patient presents with  . Urinary Tract Infection       Brief Narrative 81 y.o.malewith medical history significant ofBPH, hypertension, TIA, CKD stage III.Patient reports a one-week history of gross hematuria. He has persistent symptoms of dysuria, hesitancy, frequency. He self caths secondary to urinary retention. He reports gross hematuria with clots that have improved over the past few days, although he has had associated fever, chills, muscle aches. He stopped self cathing after he started having symptoms of hematuria. He did not try anything else to help with his symptoms. 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  ED Course: Vitals:Afebrile, slightly hypertensive, normal pulse, normal respirations Labs:Glucose of 115, BUN of 41, creatinine of 3.01, WBC of 15.2K Imaging:None obtained. Per discussion with the EDP and urologist on-call, evidence of emphysematous cystitis on outpatient CT scan Medications/Course:Zosyn given. Blood cultures obtained prior to antibiotic therapy. Urine cultures obtained after antibiotic therapy.         Subjective:    Kenneth Williams today has been afebrile. Gout is better.    No headache, No chest pain, No abdominal  pain - No Nausea, No new weakness tingling or numbness, No Cough - SOB.   Assessment  & Plan :    Active Problems:   Hypothyroidism   TIA (transient ischemic attack)   Carotid artery stenosis   BPH (benign prostatic hyperplasia)   Gout   UTI (urinary tract infection)   Pyelonephritis  E coli bacteremia     Leukocytosis, still high, slowly improving Cont currently abx, Repeat cbc in am   Sepsis secondary to E. coli bacteremia and E. coli and enterococcus UTI. Hematuria. Indwelling Foley catheter. Chronic urinary retention. Persistent Leukocytosis Urology consulted. Urine culture performed in the clinic. Blood culture performed here is positive for E. coli.Repeat culture is negative. Urine culture performed in the clinic showed E. coli as well asunidentified organism. Urine culture performed here in the hospital is showing E. coli and enterococcus. Blood cultures growing E. coli only Difficult choices, enterococcus is resistant to levofloxacin and E. coli is resistance to ampicillin and Unasyn. no simple, single oral antibiotic choice available at present.  Patient had catheter leakage and bladder spasm, catheter was changed on 09/08/2017 again, following which symptoms are resolved. Highly appreciate all the urologist involved in patient's care. Urologist does not anticipate any inpatient procedure. Patient will discharge home with Foley catheter in place. Appreciate ID input as well. Currently on IV Zosyn. Plan is to complete a total of 14 days of treatment with IV Zosyn.  Due to his CKD status IR guided line recommended and consult placed.  Central line placed 7/24  Chest pain. Reported some chest pain this morning. EKG known ischemic. Troponins are negative so far. Monitor serial troponin. Continue monitoring on telemetry.Telemetry suggest SVT, continue beta-blocker. D-dimer is mildly elevated and patient has been in the hospital and therefore we will  check a VQ scan. Chest x-ray made a comment regarding possible bilateral pneumonia although patient is on antibiotic and I do not suspect that the patient actually has an active infection but patient may benefit from incentive spirometry. Pain medication change from tramadol to Percocet. Currently pain-free.  Acute kidney injury on CKD stage III Function getting better. Continue close monitoring encourage p.o. Fluids. Renal function getting better after replacement of the Foley catheter. Encouraging p.o. fluids. Discussed with nephrology, recommend PCP to refer outpatient for establishing care with nephrologist. Not a candidate for PICC line or midline. TLC placed 7/24 Colchicine might be affecting his renal function, will monitor Check cmp in am   Hypothyroidism -Continue Synthroid  BPH Contributing to current presentation -Continue finasteride and alfuzosin  Hypertension Uncontrolled. -Continue home amlodipineand labetalol -Hydralazine prn  History of dyslipidemia -Continue Pravastatin  Constipation and poor p.o. intake. Continue stool softeners.  Severe protein calorie malnutrition Start on Prostat 31mL po bid  Gout attack Started colchicine 7/25 Change colchicine to prn   Diet: car modified diet DVT Prophylaxis:Heparin  Advance goals of care discussion:DNR DNI  Family Communication:no family was present at bedside, at the time of interview.   Disposition: Discharge to home on 7/29, PT consulted recommend no PT  Consultants:urology, infectious disease Procedures: none  Barrier to Discharge: white count elevation       Lab Results  Component Value Date   PLT 432 (H) 09/18/2017      Anti-infectives (From admission, onward)   Start     Dose/Rate Route Frequency Ordered Stop   09/09/17 1800  piperacillin-tazobactam (ZOSYN) IVPB 3.375 g     3.375 g 12.5 mL/hr over 240 Minutes Intravenous Every 8 hours 09/09/17 1331      09/09/17 1300  cefTRIAXone (ROCEPHIN) 2 g in sodium chloride 0.9 % 100 mL IVPB  Status:  Discontinued     2 g 200 mL/hr over 30 Minutes Intravenous Every 24 hours 09/09/17 1146 09/09/17 1314   09/07/17 0000  piperacillin-tazobactam (ZOSYN) IVPB 3.375 g  Status:  Discontinued  3.375 g 12.5 mL/hr over 240 Minutes Intravenous Every 8 hours 09/06/17 2259 09/09/17 1146   09/06/17 1600  ceFEPIme (MAXIPIME) 2 g in sodium chloride 0.9 % 100 mL IVPB  Status:  Discontinued     2 g 200 mL/hr over 30 Minutes Intravenous  Once 09/06/17 1528 09/06/17 1539   09/06/17 1545  piperacillin-tazobactam (ZOSYN) IVPB 3.375 g     3.375 g 100 mL/hr over 30 Minutes Intravenous  Once 09/06/17 1543 09/06/17 1644        Objective:   Vitals:   09/17/17 0556 09/17/17 1321 09/17/17 2046 09/18/17 0415  BP: (!) 171/71 134/69 (!) 153/59 (!) 148/72  Pulse: 68 64 69 69  Resp: 18 14 16 15   Temp: 98.3 F (36.8 C) 98.4 F (36.9 C) 98.5 F (36.9 C) 98.7 F (37.1 C)  TempSrc: Oral Oral Oral Oral  SpO2: 98% 95% 95% 95%  Weight:      Height:        Wt Readings from Last 3 Encounters:  09/06/17 86.1 kg (189 lb 12.8 oz)  08/17/17 86.2 kg (190 lb)  06/29/17 84.8 kg (187 lb)     Intake/Output Summary (Last 24 hours) at 09/18/2017 1151 Last data filed at 09/18/2017 0942 Gross per 24 hour  Intake 895.42 ml  Output 2350 ml  Net -1454.58 ml     Physical Exam  Awake Alert, Oriented X 3, No new F.N deficits, Normal affect [place].AT,PERRAL Supple Neck,No JVD, No cervical lymphadenopathy appriciated.  Symmetrical Chest wall movement, Good air movement bilaterally, CTAB RRR,No Gallops,Rubs or new Murmurs, No Parasternal Heave +ve B.Sounds, Abd Soft, No tenderness, No organomegaly appriciated, No rebound - guarding or rigidity. No Cyanosis, Clubbing or edema, No new Rash or bruise      Data Review:    CBC Recent Labs  Lab 09/14/17 0427 09/15/17 0419 09/16/17 0357 09/18/17 0429  WBC 19.2* 18.7* 17.7* 14.2*    HGB 9.1* 9.3* 9.0* 9.5*  HCT 28.8* 29.1* 28.6* 30.1*  PLT 440* 414* 421* 432*  MCV 88.3 88.7 90.2 89.3  MCH 27.9 28.4 28.4 28.2  MCHC 31.6 32.0 31.5 31.6  RDW 13.9 13.9 13.9 14.0  LYMPHSABS 2.6  --   --   --   MONOABS 0.9  --   --   --   EOSABS 0.5  --   --   --   BASOSABS 0.1  --   --   --     Chemistries  Recent Labs  Lab 09/14/17 0427 09/15/17 0419 09/16/17 0357 09/17/17 0449 09/18/17 0429  NA 139 141  141 141  141 141 141  K 4.1 4.2  4.2 4.3  4.3 4.5 4.4  CL 107 107  106 108  108 108 107  CO2 24 26  25 25  24 23 27   GLUCOSE 112* 118*  118* 106*  105* 104* 101*  BUN 37* 38*  38* 41*  41* 53* 54*  CREATININE 2.78* 3.02*  3.07* 2.80*  2.85* 3.21* 3.13*  CALCIUM 8.6* 9.0  8.9 8.7*  8.7* 9.0 9.1  AST  --  25 21  --  21  ALT  --  36 31  --  28  ALKPHOS  --  81 70  --  67  BILITOT  --  0.6 0.6  --  0.2*   ------------------------------------------------------------------------------------------------------------------ No results for input(s): CHOL, HDL, LDLCALC, TRIG, CHOLHDL, LDLDIRECT in the last 72 hours.  No results found for: HGBA1C ------------------------------------------------------------------------------------------------------------------ No results for input(s): TSH,  T4TOTAL, T3FREE, THYROIDAB in the last 72 hours.  Invalid input(s): FREET3 ------------------------------------------------------------------------------------------------------------------ No results for input(s): VITAMINB12, FOLATE, FERRITIN, TIBC, IRON, RETICCTPCT in the last 72 hours.  Coagulation profile Recent Labs  Lab 09/13/17 1748  INR 1.03    No results for input(s): DDIMER in the last 72 hours.  Cardiac Enzymes Recent Labs  Lab 09/12/17 0949 09/12/17 1657 09/12/17 2117  TROPONINI <0.03 <0.03 <0.03   ------------------------------------------------------------------------------------------------------------------ No results found for: BNP  Inpatient  Medications  Scheduled Meds: . alfuzosin  10 mg Oral Q breakfast  . amLODipine  5 mg Oral Daily  . cholecalciferol  2,000 Units Oral Daily  . colchicine  0.6 mg Oral Daily  . feeding supplement (NEPRO CARB STEADY)  237 mL Oral TID BM  . feeding supplement (PRO-STAT SUGAR FREE 64)  30 mL Oral BID  . finasteride  5 mg Oral Daily  . isosorbide mononitrate  30 mg Oral Daily  . labetalol  300 mg Oral BID  . levothyroxine  88 mcg Oral QAC breakfast  . multivitamin with minerals  1 tablet Oral Daily  . polyethylene glycol  17 g Oral Daily  . pravastatin  20 mg Oral Daily  . saccharomyces boulardii  250 mg Oral BID  . senna-docusate  1 tablet Oral BID   Continuous Infusions: . sodium chloride 10 mL/hr at 09/14/17 1245  . piperacillin-tazobactam (ZOSYN)  IV 3.375 g (09/18/17 0917)   PRN Meds:.acetaminophen **OR** acetaminophen, hydrALAZINE, menthol-cetylpyridinium, oxybutynin, oxyCODONE-acetaminophen, sodium chloride flush  Micro Results No results found for this or any previous visit (from the past 240 hour(s)).  Radiology Reports Dg Chest 2 View  Result Date: 09/12/2017 CLINICAL DATA:  Medial chest pain.  Shortness of breath. EXAM: CHEST - 2 VIEW COMPARISON:  Chest x-rays dated 02/06/2016 and 09/15/2015. FINDINGS: Today's study is slightly hypoinspiratory with crowding of the perihilar and bibasilar bronchovascular markings. New subtle opacity is seen within the periphery of the RIGHT upper lung, suspicious for developing pneumonia. There is also a new dense opacity at the posterior costophrenic angle on the lateral projection, most likely LEFT lower lobe, consistent with additional pneumonia, atelectasis and/or small pleural effusion. Questionable small RIGHT pleural effusion. No pneumothorax or large pleural effusion seen. Heart size and mediastinal contours are likely stable given the low lung volumes. IMPRESSION: 1. Subtle opacity within the RIGHT upper lung, suspicious for early  developing pneumonia. Recommend follow-up chest x-ray to ensure resolution. 2. Additional dense opacity at the posterior costophrenic angle on the lateral view, most likely LEFT lower lobe, consistent with pneumonia, atelectasis and/or small pleural effusion. 3. Questionable small RIGHT pleural effusion. Electronically Signed   By: Franki Cabot M.D.   On: 09/12/2017 10:35   Nm Pulmonary Perf And Vent  Result Date: 09/12/2017 CLINICAL DATA:  Left side chest pain. EXAM: NUCLEAR MEDICINE VENTILATION - PERFUSION LUNG SCAN TECHNIQUE: Ventilation images were obtained in multiple projections using inhaled aerosol Tc-52m DTPA. Perfusion images were obtained in multiple projections after intravenous injection of Tc-40m-MAA. RADIOPHARMACEUTICALS:  26.5 mCi of Tc-79m DTPA aerosol inhalation and 3.8 mCi Tc108m-MAA IV COMPARISON:  Chest x-ray earlier today FINDINGS: Ventilation: Patchy nonsegmental ventilation defects Perfusion: Patchy nonsegmental matched perfusion defects. IMPRESSION: Patchy nonsegmental matched VQ defects. No mismatched defects. Study is low probability for pulmonary embolus. Electronically Signed   By: Rolm Baptise M.D.   On: 09/12/2017 18:48   Ir Fluoro Guide Cv Line Right  Result Date: 09/14/2017 CLINICAL DATA:  Emphysematous cystitis, E coli bacteremia and need for tunneled  central venous catheter for IV antibiotic therapy. Need to preserve upper extremity veins due to underlying significant chronic kidney disease. EXAM: TUNNELED CENTRAL VENOUS CATHETER PLACEMENT WITH ULTRASOUND AND FLUOROSCOPIC GUIDANCE ANESTHESIA/SEDATION: 1.0 mg IV Versed; 50 mcg IV Fentanyl. Total Moderate Sedation Time:   13 minutes. The patient's level of consciousness and physiologic status were continuously monitored during the procedure by Radiology nursing. MEDICATIONS: None FLUOROSCOPY TIME:  6 seconds.  1.3 mGy. PROCEDURE: The procedure, risks, benefits, and alternatives were explained to the patient. Questions  regarding the procedure were encouraged and answered. The patient understands and consents to the procedure. A timeout was performed prior to initiating the procedure. Ultrasound was utilized to confirm patency of the right internal jugular vein. The right neck and chest were prepped with chlorhexidine in a sterile fashion, and a sterile drape was applied covering the operative field. Maximum barrier sterile technique with sterile gowns and gloves were used for the procedure. Local anesthesia was provided with 1% lidocaine. After creating a small venotomy incision, a 21 gauge needle was advanced into the right internal jugular vein under direct, real-time ultrasound guidance. Ultrasound image documentation was performed. After securing guidewire access, 6 French peel-away sheath was placed. A wire was utilized to estimate catheter length. A 6 French, dual-lumen power line tunneled catheter was chosen for placement. This was tunneled in a retrograde fashion from the chest wall to the venotomy incision. The catheter was cut to 26 cm based on guidewire measurement. The catheter was placed through the peel-away sheath and the sheath removed. The venotomy incision was closed with subcutaneous subcuticular 4-0 Vicryl. Dermabond was applied to the incision. The catheter exit site was secured Prolene and Ethilon retention sutures. COMPLICATIONS: None.  No pneumothorax. FINDINGS: After catheter placement, the tip lies at the SVC/RA junction. The catheter aspirates normally and is ready for immediate use. IMPRESSION: Placement of tunneled central venous catheter via the right internal jugular vein. The catheter tip lies at the SVC/RA junction. The catheter is ready for immediate use. Electronically Signed   By: Aletta Edouard M.D.   On: 09/14/2017 12:03   Ir US Guide Vasc Access Right  Result Date: 09/14/2017 CLINICAL DATA:  Emphysematous cystitis, E coli bacteremia and need for tunneled central venous catheter for IV  antibiotic therapy. Need to preserve upper extremity veins due to underlying significant chronic kidney disease. EXAM: TUNNELED CENTRAL VENOUS CATHETER PLACEMENT WITH ULTRASOUND AND FLUOROSCOPIC GUIDANCE ANESTHESIA/SEDATION: 1.0 mg IV Versed; 50 mcg IV Fentanyl. Total Moderate Sedation Time:   13 minutes. The patient's level of consciousness and physiologic status were continuously monitored during the procedure by Radiology nursing. MEDICATIONS: None FLUOROSCOPY TIME:  6 seconds.  1.3 mGy. PROCEDURE: The procedure, risks, benefits, and alternatives were explained to the patient. Questions regarding the procedure were encouraged and answered. The patient understands and consents to the procedure. A timeout was performed prior to initiating the procedure. Ultrasound was utilized to confirm patency of the right internal jugular vein. The right neck and chest were prepped with chlorhexidine in a sterile fashion, and a sterile drape was applied covering the operative field. Maximum barrier sterile technique with sterile gowns and gloves were used for the procedure. Local anesthesia was provided with 1% lidocaine. After creating a small venotomy incision, a 21 gauge needle was advanced into the right internal jugular vein under direct, real-time ultrasound guidance. Ultrasound image documentation was performed. After securing guidewire access, 6 French peel-away sheath was placed. A wire was utilized to estimate catheter length. A  6 [place], dual-lumen power line tunneled catheter was chosen for placement. This was tunneled in a retrograde fashion from the chest wall to the venotomy incision. The catheter was cut to 26 cm based on guidewire measurement. The catheter was placed through the peel-away sheath and the sheath removed. The venotomy incision was closed with subcutaneous subcuticular 4-0 Vicryl. Dermabond was applied to the incision. The catheter exit site was secured Prolene and Ethilon retention sutures.  COMPLICATIONS: None.  No pneumothorax. FINDINGS: After catheter placement, the tip lies at the SVC/RA junction. The catheter aspirates normally and is ready for immediate use. IMPRESSION: Placement of tunneled central venous catheter via the right internal jugular vein. The catheter tip lies at the SVC/RA junction. The catheter is ready for immediate use. Electronically Signed   By: Aletta Edouard M.D.   On: 09/14/2017 12:03    Time Spent in minutes  30   Jani Gravel M.D on 09/18/2017 at 11:51 AM  Between 7am to 7pm - Pager - [phone]    After 7pm go to [email or web address] - password [facility]  [facility] -  Office  [phone]

## 2017-09-19 DIAGNOSIS — R350 Frequency of micturition: Secondary | ICD-10-CM

## 2017-09-19 DIAGNOSIS — N401 Enlarged prostate with lower urinary tract symptoms: Secondary | ICD-10-CM

## 2017-09-19 LAB — RENAL FUNCTION PANEL
ALBUMIN: 2.3 g/dL — AB (ref 3.5–5.0)
ANION GAP: 7 (ref 5–15)
BUN: 57 mg/dL — AB (ref 8–23)
CHLORIDE: 106 mmol/L (ref 98–111)
CO2: 25 mmol/L (ref 22–32)
Calcium: 8.9 mg/dL (ref 8.9–10.3)
Creatinine, Ser: 2.83 mg/dL — ABNORMAL HIGH (ref 0.61–1.24)
GFR calc Af Amer: 23 mL/min — ABNORMAL LOW (ref >60)
GFR calc non Af Amer: 20 mL/min — ABNORMAL LOW (ref >60)
GLUCOSE: 115 mg/dL — AB (ref 70–99)
Phosphorus: 4.7 mg/dL — ABNORMAL HIGH (ref 2.5–4.6)
Potassium: 4.1 mmol/L (ref 3.5–5.1)
Sodium: 138 mmol/L (ref 135–145)

## 2017-09-19 LAB — COMPREHENSIVE METABOLIC PANEL
ALK PHOS: 60 U/L (ref 38–126)
ALT: 31 U/L (ref 0–44)
ANION GAP: 5 (ref 5–15)
AST: 23 U/L (ref 15–41)
Albumin: 2.3 g/dL — ABNORMAL LOW (ref 3.5–5.0)
BILIRUBIN TOTAL: 0.3 mg/dL (ref 0.3–1.2)
BUN: 58 mg/dL — ABNORMAL HIGH (ref 8–23)
CALCIUM: 8.9 mg/dL (ref 8.9–10.3)
CO2: 27 mmol/L (ref 22–32)
Chloride: 106 mmol/L (ref 98–111)
Creatinine, Ser: 2.81 mg/dL — ABNORMAL HIGH (ref 0.61–1.24)
GFR calc non Af Amer: 20 mL/min — ABNORMAL LOW (ref >60)
GFR, EST AFRICAN AMERICAN: 23 mL/min — AB (ref >60)
Glucose, Bld: 117 mg/dL — ABNORMAL HIGH (ref 70–99)
Potassium: 4 mmol/L (ref 3.5–5.1)
Sodium: 138 mmol/L (ref 135–145)
TOTAL PROTEIN: 5.9 g/dL — AB (ref 6.5–8.1)

## 2017-09-19 LAB — CBC
HCT: 28.5 % — ABNORMAL LOW (ref 39.0–52.0)
HEMOGLOBIN: 9.2 g/dL — AB (ref 13.0–17.0)
MCH: 28.5 pg (ref 26.0–34.0)
MCHC: 32.3 g/dL (ref 30.0–36.0)
MCV: 88.2 fL (ref 78.0–100.0)
Platelets: 420 10*3/uL — ABNORMAL HIGH (ref 150–400)
RBC: 3.23 MIL/uL — ABNORMAL LOW (ref 4.22–5.81)
RDW: 14 % (ref 11.5–15.5)
WBC: 11.8 10*3/uL — ABNORMAL HIGH (ref 4.0–10.5)

## 2017-09-19 MED ORDER — POLYETHYLENE GLYCOL 3350 17 G PO PACK: 17.0000 g | PACK | Freq: Every day | ORAL | 0 refills | Status: AC | PRN

## 2017-09-19 MED ORDER — SENNOSIDES-DOCUSATE SODIUM 8.6-50 MG PO TABS: 1.0000 | ORAL_TABLET | Freq: Two times a day (BID) | ORAL | 0 refills | Status: DC

## 2017-09-19 MED ORDER — PRO-STAT SUGAR FREE PO LIQD: 30.0000 mL | Freq: Two times a day (BID) | ORAL | 0 refills | Status: DC

## 2017-09-19 MED ORDER — COLCHICINE 0.6 MG PO TABS: 0.6000 mg | ORAL_TABLET | Freq: Every day | ORAL | 0 refills | Status: DC | PRN

## 2017-09-19 MED ORDER — SACCHAROMYCES BOULARDII 250 MG PO CAPS
250.0000 mg | ORAL_CAPSULE | Freq: Two times a day (BID) | ORAL | 0 refills | Status: DC
Start: 2017-09-19 — End: 2018-10-05

## 2017-09-19 NOTE — Care Management Note (Signed)
Spoke to patient abput [facility]-HHPT-patient pleasantly declines any HHPT. [facility] rep Santiago Glad notified.Nsg updated.

## 2017-09-19 NOTE — Progress Notes (Signed)
Physical Therapy Treatment Patient Details Name: Kenneth Williams MRN: [id number] DOB: 08-28-1935 Today's Date: 09/19/2017   History of Present Illness 82 yo male with PMH of BPH, hypertension, TIA, CKD stage III; admitted on 09/06/2017, presented with complaint of hematuria, was found to have sepsis secondary to E. coli UTI.     PT Comments    Pt able to ambulate around unit without any difficulty and no symptoms.  Pt hopeful for d/c home later today.  Pt is currently at supervision level and has met goals for mobility so will discharge pt from acute PT.  Pt with plans for HHPT per chart so can f/u with HHPT.   Follow Up Recommendations  Home health PT     Equipment Recommendations  None recommended by PT    Recommendations for Other Services       Precautions / Restrictions Precautions Precautions: Fall    Mobility  Bed Mobility Overal bed mobility: Modified Independent                Transfers Overall transfer level: Needs assistance Equipment used: None Transfers: Sit to/from Stand Sit to Stand: Supervision            Ambulation/Gait Ambulation/Gait assistance: Supervision Gait Distance (Feet): 400 Feet Assistive device: IV Pole Gait Pattern/deviations: Step-through pattern;Decreased stride length     General Gait Details: pt pushed IV pole however not reliant on for support, tolerated distance well, pt had no symptoms    Stairs             Wheelchair Mobility    Modified Rankin (Stroke Patients Only)       Balance                                            Cognition Arousal/Alertness: Awake/alert Behavior During Therapy: WFL for tasks assessed/performed Overall Cognitive Status: Within Functional Limits for tasks assessed                                        Exercises      General Comments        Pertinent Vitals/Pain Pain Assessment: No/denies pain    Home Living                      Prior Function            PT Goals (current goals can now be found in the care plan section) Progress towards PT goals: Goals met/education completed, patient discharged from PT    Frequency    Min 3X/week      PT Plan Other (comment)(d/c from acute PT)    Co-evaluation              AM-PAC PT "6 Clicks" Daily Activity  Outcome Measure  Difficulty turning over in bed (including adjusting bedclothes, sheets and blankets)?: None Difficulty moving from lying on back to sitting on the side of the bed? : None Difficulty sitting down on and standing up from a chair with arms (e.g., wheelchair, bedside commode, etc,.)?: None Help needed moving to and from a bed to chair (including a wheelchair)?: A Little Help needed walking in hospital room?: A Little Help needed climbing 3-5 steps with a railing? : A Little 6 Click Score:  21    End of Session   Activity Tolerance: Patient tolerated treatment well Patient left: in bed;with call bell/phone within reach   PT Visit Diagnosis: Difficulty in walking, not elsewhere classified (R26.2)     Time: 1100-1110 PT Time Calculation (min) (ACUTE ONLY): 10 min  Charges:  $Gait Training: 8-22 mins                     Kenneth Williams, PT, DPT 09/19/2017 Pager: [phone]  York Ram E 09/19/2017, 12:22 PM

## 2017-09-19 NOTE — Care Management Important Message (Signed)
Important Message  Patient Details  Name: Kenneth Williams MRN: [id number] Date of Birth: [birth year]   Medicare Important Message Given:  Yes    [name] 09/19/2017, 11:34 AMImportant Message  Patient Details  Name: Kenneth Williams MRN: [id number] Date of Birth: 01/21/1936   Medicare Important Message Given:  Yes    [name] 09/19/2017, 11:34 AM

## 2017-09-19 NOTE — Care Management Note (Signed)
[facility]- HHRN/PT-rep Santiago Glad aware of d/c & HHRN/PT-no home iv abx. No further CM needs.

## 2017-09-19 NOTE — Progress Notes (Signed)
Pharmacy Antibiotic Note  Kenneth Williams is a 82 y.o. male admitted on 09/06/2017 with UTI and E.coli bacteremia. Patient has Hx pseudomonal UTI. 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  Pharmacy has been consulted for piperacillin/tazobactam dosing.  Today, 09/19/17  Completed D#13/14 of IV antibiotics   OPAT completed  WBC elevated but trending down   Afebrile  Line placed by INR   Scr now trending down again   Plan:  Continue piperacillin/tazobactam 3.375 g IV q8h EI, plan is to continue through 09/19/17   Dose at discharge to be 2.25gm IV q6h (30 min infusion)  Follow SCr closely for any renal adjustments needed  Follow cultures as needed   Height: 6\' 2"  (188 cm) Weight: 189 lb 12.8 oz (86.1 kg) IBW/kg (Calculated) : 82.2  Temp (24hrs), Avg:98.1 F (36.7 C), Min:97.5 F (36.4 C), Max:98.9 F (37.2 C)  Recent Labs  Lab 09/14/17 0427 09/15/17 0419 09/16/17 0357 09/17/17 0449 09/18/17 0429 09/19/17 0350 09/19/17 0351  WBC 19.2* 18.7* 17.7*  --  14.2*  --  11.8*  CREATININE 2.78* 3.02*  3.07* 2.80*  2.85* 3.21* 3.13* 2.83* 2.81*    Estimated Creatinine Clearance: 24.4 mL/min (A) (by C-G formula based on SCr of 2.81 mg/dL (H)).    Allergies  Allergen Reactions  . Lipitor [Atorvastatin]     diarrhea  . Spironolactone     gynecomastia  . Zocor [Simvastatin]     achy  . Coreg [Carvedilol] Diarrhea    Diarrhea and bradycardia  . Donepezil Hydrochloride Diarrhea    REACTION: diarrhea  . Hydrochlorothiazide Other (See Comments)    REACTION: Gout  . Razadyne [Galantamine Hydrobromide] Diarrhea    diarrhea    Dose adjustments this admission: n/a  Microbiology results: 7/16 BCx: E.coli, R amp, Int unasyn 7/16 UCx: 60K Ecoli and enterococcus faecalis (only levo resis) 7/18 repeat BCx: ngtd  Thank you for allowing pharmacy to be a part of this patient's care.   Netta Cedars,  PharmD, BCPS Pager: [phone] 09/19/2017 8:50 AM

## 2017-09-19 NOTE — Discharge Summary (Signed)
Kenneth Williams, is a 82 y.o. male  DOB Mar 18, 1935  MRN [id number].  Admission date:  09/06/2017  Admitting Physician  Mariel Aloe, MD  Discharge Date:  09/19/2017   Primary MD  Plotnikov, Evie Lacks, MD  Recommendations for primary care physician for things to follow:      Sepsis secondary to E. coli bacteremia and E. coli and enterococcus UTI. Hematuria. Indwelling Foley catheter. Chronic urinary retention. Urine culture performed in the clinic. Urine culture 7/16=> E. Coli, Enterococcus Blood culture 7/16=>  1/2 Enterobacter/ E. Coli Blood culture 7/18=> no growth Patient had catheter leakage and bladder spasm, catheter was changed on 09/08/2017 again, following which symptoms are resolved. Central line placed 7/24=> removed 7/29 Pt will call urology in AM regarding foley catheter and removal F/u with Urology on 10/04/2017 as scheduled Check cbc in 1 week with pcp   Acute kidney injury on CKD stage III Check cmp in 1 week with pcp  Hypothyroidism Continue Synthroid  BPH Continue finasteride and alfuzosin Pt will call urology in AM regarding foley catheter removal  Hypertension Continue home amlodipineand labetalol Fu with pcp in 1 week to check bp  History of dyslipidemia Continue Pravastatin  Constipation and poor p.o. intake. Continue stool softeners.  Severe protein calorie malnutrition Cont Prostat 20mL po bid  Gout attack Colchicine prn  May need allopurinol if continues to have gout attack Defer to pcp          Admission Diagnosis  Pyelonephritis [N12] Acute cystitis with hematuria [N30.01]   Discharge Diagnosis  Pyelonephritis [N12] Acute cystitis with hematuria [N30.01]     Active Problems:   Hypothyroidism   TIA (transient ischemic attack)   Carotid artery stenosis   BPH (benign prostatic hyperplasia)   Gout   UTI (urinary tract  infection)   Pyelonephritis   E coli bacteremia      Past Medical History:  Diagnosis Date  . BPH (benign prostatic hyperplasia)   . CAD (coronary artery disease)    cath 2011-mild non obstructive  . Elevated PSA    history of   . Gout    2009  . History of diverticulitis of colon   . History of pancreatitis   . History of TIAs   . Hyperlipidemia   . Hypertension   . Hypothyroidism   . Hypothyroidism   . Left ear hearing loss    wears right hearing aid  . Personal history of hyperthyroidism 2006   s/p 131I- Dr. Chalmers Cater, Hyperthyroid  . Renal insufficiency 2011  . TGA (transient global amnesia)     Past Surgical History:  Procedure Laterality Date  . CHOLECYSTECTOMY    . COLONOSCOPY    . IR FLUORO GUIDE CV LINE RIGHT  09/14/2017  . IR US GUIDE VASC ACCESS RIGHT  09/14/2017  . STAPEDES SURGERY     LEFT EAR       HPI  from the history and physical done on the day of admission:    Brief Narrative  82 y.o.malewith medical history significant ofBPH, hypertension, TIA, CKD stage III.Patient reports a one-week history of gross hematuria. He has persistent symptoms of dysuria, hesitancy, frequency. He self caths secondary to urinary retention. He reports gross hematuria with clots that have improved over the past few days, although he has had associated fever, chills, muscle aches. He stopped self cathing after he started having symptoms of hematuria. He did not try anything else to help with his symptoms. 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  ED Course: Vitals:Afebrile, slightly hypertensive, normal pulse, normal respirations Labs:Glucose of 115, BUN of 41, creatinine of 3.01, WBC of 15.2K Imaging:None obtained. Per discussion with the EDP and urologist on-call, evidence of emphysematous cystitis on outpatient CT scan Medications/Course:Zosyn given. Blood cultures  obtained prior to antibiotic therapy. Urine cultures obtained after antibiotic therapy.         Hospital Course:      Pt was admitted for fever and uti with hematuria.  Pt was started on zosyn empirically. Blood  Culture 7/16=> enterobacter, and E. Coli urine culture 7/16=> E coli and enterococcus faecalis.  Pt was continued on zosyn and ID was consulted , and recommended zosyn thru 7/29.  Pt had persistent leukocytosis and therefore remained in the hospital to complete his course of abx.  Pt had ARF on admission and this improved with IVF.  Pt had slight c/o left sided chest pain, VQ scan low prob PE, troponin negative x3, prior physician thought pain was atypical. Pt had IR   Pt had gout attack 7/26 and was started on colchicine which caused a slight bump of his creatinine that later normalized.  Pt states that his left foot is better.  Pt has been afebrile and requesting discharge this evening.     Follow UP  Follow-up Information    Franchot Gallo, MD On 10/03/2017.   Specialty:  Urology Why:  11:15 AM Contact information: [address] [place] [address] [phone]        Plotnikov, Evie Lacks, MD Follow up in 1 week(s).   Specialty:  Internal Medicine Contact information: [place] [address] [phone]            Consults obtained - Urology, ID,   Discharge Condition: stable  Diet and Activity recommendation: See Discharge Instructions below  Discharge Instructions          Discharge Medications     Allergies as of 09/19/2017      Reactions   Lipitor [atorvastatin]    diarrhea   Spironolactone    gynecomastia   Zocor [simvastatin]    achy   Coreg [carvedilol] Diarrhea   Diarrhea and bradycardia   Donepezil Hydrochloride Diarrhea   REACTION: diarrhea   Hydrochlorothiazide Other (See Comments)   REACTION: Gout   Razadyne [galantamine Hydrobromide] Diarrhea   diarrhea      Medication List    STOP taking these  medications   loperamide 2 MG tablet Commonly known as:  IMODIUM A-D     TAKE these medications   acetaminophen 500 MG tablet Commonly known as:  TYLENOL Take 500 mg by mouth 2 (two) times daily as needed for mild pain.   alfuzosin 10 MG 24 hr tablet Commonly known as:  UROXATRAL TAKE 1 TABLET (10 MG TOTAL) BY MOUTH DAILY WITH BREAKFAST.   amLODipine 2.5 MG tablet Commonly known as:  NORVASC Take 1 tablet (2.5 mg  total) by mouth daily.   cholecalciferol 1000 units tablet Commonly known as:  VITAMIN D Take 2,000 Units by mouth daily.   clotrimazole-betamethasone cream Commonly known as:  LOTRISONE Apply 1 application topically 2 (two) times daily.   colchicine 0.6 MG tablet Take 1 tablet (0.6 mg total) by mouth daily as needed (gout). What changed:    when to take this  reasons to take this  additional instructions   feeding supplement (PRO-STAT SUGAR FREE 64) Liqd Take 30 mLs by mouth 2 (two) times daily.   finasteride 5 MG tablet Commonly known as:  PROSCAR TAKE 1 TABLET (5 MG TOTAL) BY MOUTH DAILY.   labetalol 300 MG tablet Commonly known as:  NORMODYNE Take 1 tablet (300 mg total) by mouth 2 (two) times daily.   levothyroxine 88 MCG tablet Commonly known as:  SYNTHROID, LEVOTHROID TAKE 1 TABLET (88 MCG TOTAL) BY MOUTH DAILY.   polyethylene glycol packet Commonly known as:  MIRALAX / GLYCOLAX Take 17 g by mouth daily as needed for mild constipation.   pravastatin 20 MG tablet Commonly known as:  PRAVACHOL Take 1 tablet (20 mg total) by mouth daily.   saccharomyces boulardii 250 MG capsule Commonly known as:  FLORASTOR Take 1 capsule (250 mg total) by mouth 2 (two) times daily.   senna-docusate 8.6-50 MG tablet Commonly known as:  Senokot-S Take 1 tablet by mouth 2 (two) times daily.   VITAMIN B 12 PO Take 1 tablet by mouth daily.       Major procedures and Radiology Reports - PLEASE review detailed and final reports for all details, in brief  -       Dg Chest 2 View  Result Date: 09/12/2017 CLINICAL DATA:  Medial chest pain.  Shortness of breath. EXAM: CHEST - 2 VIEW COMPARISON:  Chest x-rays dated 02/06/2016 and 09/15/2015. FINDINGS: Today's study is slightly hypoinspiratory with crowding of the perihilar and bibasilar bronchovascular markings. New subtle opacity is seen within the periphery of the RIGHT upper lung, suspicious for developing pneumonia. There is also a new dense opacity at the posterior costophrenic angle on the lateral projection, most likely LEFT lower lobe, consistent with additional pneumonia, atelectasis and/or small pleural effusion. Questionable small RIGHT pleural effusion. No pneumothorax or large pleural effusion seen. Heart size and mediastinal contours are likely stable given the low lung volumes. IMPRESSION: 1. Subtle opacity within the RIGHT upper lung, suspicious for early developing pneumonia. Recommend follow-up chest x-ray to ensure resolution. 2. Additional dense opacity at the posterior costophrenic angle on the lateral view, most likely LEFT lower lobe, consistent with pneumonia, atelectasis and/or small pleural effusion. 3. Questionable small RIGHT pleural effusion. Electronically Signed   By: Franki Cabot M.D.   On: 09/12/2017 10:35   Nm Pulmonary Perf And Vent  Result Date: 09/12/2017 CLINICAL DATA:  Left side chest pain. EXAM: NUCLEAR MEDICINE VENTILATION - PERFUSION LUNG SCAN TECHNIQUE: Ventilation images were obtained in multiple projections using inhaled aerosol Tc-36m DTPA. Perfusion images were obtained in multiple projections after intravenous injection of Tc-44m-MAA. RADIOPHARMACEUTICALS:  26.5 mCi of Tc-66m DTPA aerosol inhalation and 3.8 mCi Tc36m-MAA IV COMPARISON:  Chest x-ray earlier today FINDINGS: Ventilation: Patchy nonsegmental ventilation defects Perfusion: Patchy nonsegmental matched perfusion defects. IMPRESSION: Patchy nonsegmental matched VQ defects. No mismatched defects. Study  is low probability for pulmonary embolus. Electronically Signed   By: Rolm Baptise M.D.   On: 09/12/2017 18:48   Ir Fluoro Guide Cv Line Right  Result Date: 09/14/2017 CLINICAL DATA:  Emphysematous cystitis, E coli bacteremia and need for tunneled central venous catheter for IV antibiotic therapy. Need to preserve upper extremity veins due to underlying significant chronic kidney disease. EXAM: TUNNELED CENTRAL VENOUS CATHETER PLACEMENT WITH ULTRASOUND AND FLUOROSCOPIC GUIDANCE ANESTHESIA/SEDATION: 1.0 mg IV Versed; 50 mcg IV Fentanyl. Total Moderate Sedation Time:   13 minutes. The patient's level of consciousness and physiologic status were continuously monitored during the procedure by Radiology nursing. MEDICATIONS: None FLUOROSCOPY TIME:  6 seconds.  1.3 mGy. PROCEDURE: The procedure, risks, benefits, and alternatives were explained to the patient. Questions regarding the procedure were encouraged and answered. The patient understands and consents to the procedure. A timeout was performed prior to initiating the procedure. Ultrasound was utilized to confirm patency of the right internal jugular vein. The right neck and chest were prepped with chlorhexidine in a sterile fashion, and a sterile drape was applied covering the operative field. Maximum barrier sterile technique with sterile gowns and gloves were used for the procedure. Local anesthesia was provided with 1% lidocaine. After creating a small venotomy incision, a 21 gauge needle was advanced into the right internal jugular vein under direct, real-time ultrasound guidance. Ultrasound image documentation was performed. After securing guidewire access, 6 French peel-away sheath was placed. A wire was utilized to estimate catheter length. A 6 French, dual-lumen power line tunneled catheter was chosen for placement. This was tunneled in a retrograde fashion from the chest wall to the venotomy incision. The catheter was cut to 26 cm based on guidewire  measurement. The catheter was placed through the peel-away sheath and the sheath removed. The venotomy incision was closed with subcutaneous subcuticular 4-0 Vicryl. Dermabond was applied to the incision. The catheter exit site was secured Prolene and Ethilon retention sutures. COMPLICATIONS: None.  No pneumothorax. FINDINGS: After catheter placement, the tip lies at the SVC/RA junction. The catheter aspirates normally and is ready for immediate use. IMPRESSION: Placement of tunneled central venous catheter via the right internal jugular vein. The catheter tip lies at the SVC/RA junction. The catheter is ready for immediate use. Electronically Signed   By: Aletta Edouard M.D.   On: 09/14/2017 12:03   Ir US Guide Vasc Access Right  Result Date: 09/14/2017 CLINICAL DATA:  Emphysematous cystitis, E coli bacteremia and need for tunneled central venous catheter for IV antibiotic therapy. Need to preserve upper extremity veins due to underlying significant chronic kidney disease. EXAM: TUNNELED CENTRAL VENOUS CATHETER PLACEMENT WITH ULTRASOUND AND FLUOROSCOPIC GUIDANCE ANESTHESIA/SEDATION: 1.0 mg IV Versed; 50 mcg IV Fentanyl. Total Moderate Sedation Time:   13 minutes. The patient's level of consciousness and physiologic status were continuously monitored during the procedure by Radiology nursing. MEDICATIONS: None FLUOROSCOPY TIME:  6 seconds.  1.3 mGy. PROCEDURE: The procedure, risks, benefits, and alternatives were explained to the patient. Questions regarding the procedure were encouraged and answered. The patient understands and consents to the procedure. A timeout was performed prior to initiating the procedure. Ultrasound was utilized to confirm patency of the right internal jugular vein. The right neck and chest were prepped with chlorhexidine in a sterile fashion, and a sterile drape was applied covering the operative field. Maximum barrier sterile technique with sterile gowns and gloves were used for the  procedure. Local anesthesia was provided with 1% lidocaine. After creating a small venotomy incision, a 21 gauge needle was advanced into the right internal jugular vein under direct, real-time ultrasound guidance. Ultrasound image documentation was performed. After securing guidewire access, 6 French peel-away sheath was placed.  A wire was utilized to estimate catheter length. A 6 French, dual-lumen power line tunneled catheter was chosen for placement. This was tunneled in a retrograde fashion from the chest wall to the venotomy incision. The catheter was cut to 26 cm based on guidewire measurement. The catheter was placed through the peel-away sheath and the sheath removed. The venotomy incision was closed with subcutaneous subcuticular 4-0 Vicryl. Dermabond was applied to the incision. The catheter exit site was secured Prolene and Ethilon retention sutures. COMPLICATIONS: None.  No pneumothorax. FINDINGS: After catheter placement, the tip lies at the SVC/RA junction. The catheter aspirates normally and is ready for immediate use. IMPRESSION: Placement of tunneled central venous catheter via the right internal jugular vein. The catheter tip lies at the SVC/RA junction. The catheter is ready for immediate use. Electronically Signed   By: Aletta Edouard M.D.   On: 09/14/2017 12:03    Micro Results      No results found for this or any previous visit (from the past 240 hour(s)).     Today   Subjective    [name] today has no dysuria, no hematuria. foley working well.  Good urine output, afebrile.    No headache,no chest abdominal pain,no new weakness tingling or numbness, feels much better wants to go home today.   Objective   Blood pressure (!) 156/72, pulse 72, temperature 98.4 F (36.9 C), temperature source Oral, resp. rate 20, height 6\' 2"  (1.88 m), weight 86.1 kg (189 lb 12.8 oz), SpO2 95 %.   Intake/Output Summary (Last 24 hours) at 09/19/2017 1807 Last data filed at  09/19/2017 1557 Gross per 24 hour  Intake 1005 ml  Output 2600 ml  Net -1595 ml    Exam Awake Alert, Oriented x 3, No new F.N deficits, Normal affect [place].AT,PERRAL Supple Neck,No JVD, No cervical lymphadenopathy appriciated.  Symmetrical Chest wall movement, Good air movement bilaterally, CTAB RRR,No Gallops,Rubs or new Murmurs, No Parasternal Heave +ve B.Sounds, Abd Soft, Non tender, No organomegaly appriciated, No rebound -guarding or rigidity. No Cyanosis, Clubbing or edema, No new Rash or bruise Foley in place   Data Review   CBC w Diff:  Lab Results  Component Value Date   WBC 11.8 (H) 09/19/2017   HGB 9.2 (L) 09/19/2017   HCT 28.5 (L) 09/19/2017   PLT 420 (H) 09/19/2017   LYMPHOPCT 14 09/14/2017   MONOPCT 5 09/14/2017   EOSPCT 3 09/14/2017   BASOPCT 0 09/14/2017    CMP:  Lab Results  Component Value Date   NA 138 09/19/2017   K 4.0 09/19/2017   CL 106 09/19/2017   CO2 27 09/19/2017   BUN 58 (H) 09/19/2017   CREATININE 2.81 (H) 09/19/2017   PROT 5.9 (L) 09/19/2017   ALBUMIN 2.3 (L) 09/19/2017   BILITOT 0.3 09/19/2017   ALKPHOS 60 09/19/2017   AST 23 09/19/2017   ALT 31 09/19/2017  .   Total Time in preparing paper work, data evaluation and todays exam - 27 minutes  Jani Gravel M.D on 09/19/2017 at 6:07 PM  [facility]   Office  [phone]

## 2017-09-19 NOTE — Progress Notes (Signed)
Patient will have the PICC removed tomorrow in IR.The reason IV team cannot remove PICC is because it is tunneled.  Patient was notified that the discharge has been canceled for today.  PCP on call was notified to cancel today's discharge order and notified that the PICC has to be removed in IR tomorrow.  Awaiting new orders.

## 2017-09-20 ENCOUNTER — Inpatient Hospital Stay (HOSPITAL_COMMUNITY): Payer: PPO

## 2017-09-20 ENCOUNTER — Encounter (HOSPITAL_COMMUNITY): Payer: Self-pay | Admitting: Radiology

## 2017-09-20 HISTORY — PX: IR REMOVAL TUN CV CATH W/O FL: IMG2289

## 2017-09-20 LAB — RENAL FUNCTION PANEL
ALBUMIN: 2.5 g/dL — AB (ref 3.5–5.0)
ANION GAP: 7 (ref 5–15)
BUN: 57 mg/dL — ABNORMAL HIGH (ref 8–23)
CALCIUM: 9.2 mg/dL (ref 8.9–10.3)
CO2: 27 mmol/L (ref 22–32)
CREATININE: 2.92 mg/dL — AB (ref 0.61–1.24)
Chloride: 106 mmol/L (ref 98–111)
GFR calc non Af Amer: 19 mL/min — ABNORMAL LOW (ref >60)
GFR, EST AFRICAN AMERICAN: 22 mL/min — AB (ref >60)
GLUCOSE: 101 mg/dL — AB (ref 70–99)
PHOSPHORUS: 4.8 mg/dL — AB (ref 2.5–4.6)
Potassium: 4.2 mmol/L (ref 3.5–5.1)
SODIUM: 140 mmol/L (ref 135–145)

## 2017-09-20 NOTE — Progress Notes (Signed)
IV Team: Spoke with RN that IR removes patient's tunnelled central line.    Enos Fling RN IV [facility] team

## 2017-09-20 NOTE — Procedures (Signed)
Per primary care team order, patient's right internal jugular tunneled central venous catheter was removed in its entirety without immediate complications.  Vaseline gauze dressing applied over site.  Site care instructions reviewed with patient.

## 2017-09-21 ENCOUNTER — Telehealth: Payer: Self-pay | Admitting: *Deleted

## 2017-09-21 NOTE — Telephone Encounter (Signed)
Pt was on TCM report d/c yesterday and needing to make hosp f/u appt. Tried calling pt no answer LMOM to RTC to schedule appt. Sent [facility] CRM for FYI.Marland KitchenJohny Chess

## 2017-09-22 NOTE — Telephone Encounter (Signed)
Called pt again to set-up hosp f/u appt w/PCP no answer LMOM to call to make appt ASAP. Close encounter.Marland KitchenJohny Chess

## 2017-09-24 ENCOUNTER — Other Ambulatory Visit: Payer: Self-pay | Admitting: Internal Medicine

## 2017-10-03 DIAGNOSIS — N401 Enlarged prostate with lower urinary tract symptoms: Secondary | ICD-10-CM | POA: Diagnosis not present

## 2017-10-03 DIAGNOSIS — N453 Epididymo-orchitis: Secondary | ICD-10-CM | POA: Diagnosis not present

## 2017-10-03 DIAGNOSIS — R3914 Feeling of incomplete bladder emptying: Secondary | ICD-10-CM | POA: Diagnosis not present

## 2017-10-05 ENCOUNTER — Telehealth: Payer: Self-pay | Admitting: Internal Medicine

## 2017-10-05 ENCOUNTER — Inpatient Hospital Stay: Payer: PPO | Admitting: Internal Medicine

## 2017-10-05 NOTE — Telephone Encounter (Signed)
FYI../lmb

## 2017-10-05 NOTE — Telephone Encounter (Signed)
Copied from [place] [id number]. Topic: General - Other >> Oct 05, 2017  1:42 PM Cecelia Byars, [place] wrote: Shelby Dubin from [facility] called and said they are closing the case on the patient as they are unable  to get in touch with him , please call her with any questions at [phone]

## 2017-10-07 ENCOUNTER — Inpatient Hospital Stay: Payer: PPO | Admitting: Internal Medicine

## 2017-10-07 DIAGNOSIS — R338 Other retention of urine: Secondary | ICD-10-CM | POA: Diagnosis not present

## 2017-10-07 DIAGNOSIS — N401 Enlarged prostate with lower urinary tract symptoms: Secondary | ICD-10-CM | POA: Diagnosis not present

## 2017-10-07 DIAGNOSIS — N3000 Acute cystitis without hematuria: Secondary | ICD-10-CM | POA: Diagnosis not present

## 2017-10-07 DIAGNOSIS — N453 Epididymo-orchitis: Secondary | ICD-10-CM | POA: Diagnosis not present

## 2017-10-12 DIAGNOSIS — R338 Other retention of urine: Secondary | ICD-10-CM | POA: Diagnosis not present

## 2017-10-12 DIAGNOSIS — R3914 Feeling of incomplete bladder emptying: Secondary | ICD-10-CM | POA: Diagnosis not present

## 2017-10-13 DIAGNOSIS — Z87440 Personal history of urinary (tract) infections: Secondary | ICD-10-CM | POA: Diagnosis not present

## 2017-10-14 DIAGNOSIS — Z87440 Personal history of urinary (tract) infections: Secondary | ICD-10-CM | POA: Diagnosis not present

## 2017-10-15 DIAGNOSIS — Z87440 Personal history of urinary (tract) infections: Secondary | ICD-10-CM | POA: Diagnosis not present

## 2017-10-18 ENCOUNTER — Other Ambulatory Visit: Payer: Self-pay | Admitting: Urology

## 2017-10-20 NOTE — Progress Notes (Signed)
Stress 2017 epic   cxr 09-12-17 epic  ekg 09-12-17 epic

## 2017-10-20 NOTE — Patient Instructions (Addendum)
Kenneth Williams  10/20/2017   Your procedure is scheduled on: 10-31-17  Report to [facility] Main  Entrance              Report to admitting at       [place] AM    Call this number if you have problems the morning of surgery [phone]    Remember: Do not eat food or drink liquids :After Midnight.     Take these medicines the morning of surgery with A SIP OF WATER: prevastatin, levothyroxine, labatelol, finasteride, amlodipine, alfuzosin(uroxatral), colchicine if needed                                You may not have any metal on your body including hair pins and              piercings  Do not wear jewelry,, lotions, powders or perfumes, deodorant                  Men may shave face and neck.   Do not bring valuables to the hospital. [place].  Contacts, dentures or bridgework may not be worn into surgery.  Leave suitcase in the car. After surgery it may be brought to your room.                  Please read over the following fact sheets you were given: _____________________________________________________________________           [facility] - Preparing for Surgery Before surgery, you can play an important role.  Because skin is not sterile, your skin needs to be as free of germs as possible.  You can reduce the number of germs on your skin by washing with CHG (chlorahexidine gluconate) soap before surgery.  CHG is an antiseptic cleaner which kills germs and bonds with the skin to continue killing germs even after washing. Please DO NOT use if you have an allergy to CHG or antibacterial soaps.  If your skin becomes reddened/irritated stop using the CHG and inform your nurse when you arrive at Short Stay. Do not shave (including legs and underarms) for at least 48 hours prior to the first CHG shower.  You may shave your face/neck. Please follow these instructions carefully:  1.  Shower with CHG Soap the  night before surgery and the  morning of Surgery.  2.  If you choose to wash your hair, wash your hair first as usual with your  normal  shampoo.  3.  After you shampoo, rinse your hair and body thoroughly to remove the  shampoo.                           4.  Use CHG as you would any other liquid soap.  You can apply chg directly  to the skin and wash                       Gently with a scrungie or clean washcloth.  5.  Apply the CHG Soap to your body ONLY FROM THE NECK DOWN.   Do not use on face/ open  Wound or open sores. Avoid contact with eyes, ears mouth and genitals (private parts).                       Wash face,  Genitals (private parts) with your normal soap.             6.  Wash thoroughly, paying special attention to the area where your surgery  will be performed.  7.  Thoroughly rinse your body with warm water from the neck down.  8.  DO NOT shower/wash with your normal soap after using and rinsing off  the CHG Soap.                9.  Pat yourself dry with a clean towel.            10.  Wear clean pajamas.            11.  Place clean sheets on your bed the night of your first shower and do not  sleep with pets. Day of Surgery : Do not apply any lotions/deodorants the morning of surgery.  Please wear clean clothes to the hospital/surgery center.  FAILURE TO FOLLOW THESE INSTRUCTIONS MAY RESULT IN THE CANCELLATION OF YOUR SURGERY PATIENT SIGNATURE_________________________________  NURSE SIGNATURE__________________________________  ________________________________________________________________________

## 2017-10-25 DIAGNOSIS — N3000 Acute cystitis without hematuria: Secondary | ICD-10-CM | POA: Diagnosis not present

## 2017-10-26 ENCOUNTER — Encounter (HOSPITAL_COMMUNITY)
Admission: RE | Admit: 2017-10-26 | Discharge: 2017-10-26 | Disposition: A | Discharge status: Home / Self Care | Payer: PPO | Source: Ambulatory Visit | Attending: Urology | Admitting: Urology

## 2017-10-26 ENCOUNTER — Encounter (HOSPITAL_COMMUNITY): Payer: Self-pay

## 2017-10-26 ENCOUNTER — Other Ambulatory Visit: Payer: Self-pay

## 2017-10-26 ENCOUNTER — Ambulatory Visit (HOSPITAL_COMMUNITY)
Admission: RE | Admit: 2017-10-26 | Discharge: 2017-10-26 | Disposition: A | Discharge status: Home / Self Care | Payer: PPO | Source: Ambulatory Visit | Attending: Anesthesiology | Admitting: Anesthesiology

## 2017-10-26 DIAGNOSIS — Z01818 Encounter for other preprocedural examination: Secondary | ICD-10-CM | POA: Diagnosis not present

## 2017-10-26 DIAGNOSIS — Z01812 Encounter for preprocedural laboratory examination: Secondary | ICD-10-CM | POA: Diagnosis not present

## 2017-10-26 DIAGNOSIS — R338 Other retention of urine: Secondary | ICD-10-CM | POA: Insufficient documentation

## 2017-10-26 DIAGNOSIS — N401 Enlarged prostate with lower urinary tract symptoms: Secondary | ICD-10-CM | POA: Diagnosis not present

## 2017-10-26 HISTORY — DX: Dyspnea, unspecified: R06.00

## 2017-10-26 LAB — CBC
HCT: 36.7 % — ABNORMAL LOW (ref 39.0–52.0)
Hemoglobin: 11.8 g/dL — ABNORMAL LOW (ref 13.0–17.0)
MCH: 28.3 pg (ref 26.0–34.0)
MCHC: 32.2 g/dL (ref 30.0–36.0)
MCV: 88 fL (ref 78.0–100.0)
Platelets: 285 10*3/uL (ref 150–400)
RBC: 4.17 MIL/uL — ABNORMAL LOW (ref 4.22–5.81)
RDW: 13.8 % (ref 11.5–15.5)
WBC: 9 10*3/uL (ref 4.0–10.5)

## 2017-10-26 LAB — BASIC METABOLIC PANEL
Anion gap: 8 (ref 5–15)
BUN: 40 mg/dL — AB (ref 8–23)
CO2: 27 mmol/L (ref 22–32)
Calcium: 9.6 mg/dL (ref 8.9–10.3)
Chloride: 109 mmol/L (ref 98–111)
Creatinine, Ser: 2.59 mg/dL — ABNORMAL HIGH (ref 0.61–1.24)
GFR calc Af Amer: 25 mL/min — ABNORMAL LOW (ref >60)
GFR, EST NON AFRICAN AMERICAN: 22 mL/min — AB (ref >60)
GLUCOSE: 100 mg/dL — AB (ref 70–99)
Potassium: 5.6 mmol/L — ABNORMAL HIGH (ref 3.5–5.1)
SODIUM: 144 mmol/L (ref 135–145)

## 2017-10-26 NOTE — Progress Notes (Signed)
Bmp done 10-26-17 routed to Dr. Diona Fanti via epic

## 2017-10-28 ENCOUNTER — Ambulatory Visit (HOSPITAL_COMMUNITY): Payer: PPO | Admitting: Anesthesiology

## 2017-10-29 ENCOUNTER — Other Ambulatory Visit: Payer: Self-pay | Admitting: Internal Medicine

## 2017-10-30 MED ORDER — GENTAMICIN SULFATE 40 MG/ML IJ SOLN
1.5000 mg/kg | INTRAVENOUS | Status: DC
  Filled 2017-10-30: qty 3.25

## 2017-10-30 NOTE — Anesthesia Preprocedure Evaluation (Addendum)
Anesthesia Evaluation  Patient identified by MRN, date of birth, ID band Patient awake    Reviewed: Allergy & Precautions, NPO status , Patient's Chart, lab work & pertinent test results  Airway Mallampati: II  TM Distance: >3 FB Neck ROM: Full    Dental  (+) Dental Advisory Given   Pulmonary neg pulmonary ROS,    breath sounds clear to auscultation       Cardiovascular hypertension, Pt. on medications and Pt. on home beta blockers + CAD and + Peripheral Vascular Disease   Rhythm:Regular Rate:Normal     Neuro/Psych Depression TIA   GI/Hepatic negative GI ROS, Neg liver ROS,   Endo/Other  Hypothyroidism   Renal/GU Renal InsufficiencyRenal disease     Musculoskeletal   Abdominal   Peds  Hematology  (+) anemia ,   Anesthesia Other Findings   Reproductive/Obstetrics                            Lab Results  Component Value Date   WBC 9.0 10/26/2017   HGB 11.8 (L) 10/26/2017   HCT 36.7 (L) 10/26/2017   MCV 88.0 10/26/2017   PLT 285 10/26/2017   Lab Results  Component Value Date   CREATININE 2.59 (H) 10/26/2017   BUN 40 (H) 10/26/2017   NA 144 10/26/2017   K 5.6 (H) 10/26/2017   CL 109 10/26/2017   CO2 27 10/26/2017    Anesthesia Physical Anesthesia Plan  ASA: III  Anesthesia Plan: General   Post-op Pain Management:    Induction: Intravenous  PONV Risk Score and Plan: 2 and Dexamethasone, Ondansetron and Treatment may vary due to age or medical condition  Airway Management Planned: LMA  Additional Equipment:   Intra-op Plan:   Post-operative Plan: Extubation in OR  Informed Consent: I have reviewed the patients History and Physical, chart, labs and discussed the procedure including the risks, benefits and alternatives for the proposed anesthesia with the patient or authorized representative who has indicated his/her understanding and acceptance.   Dental advisory  given  Plan Discussed with: CRNA  Anesthesia Plan Comments:        Anesthesia Quick Evaluation

## 2017-10-31 ENCOUNTER — Encounter (HOSPITAL_COMMUNITY): Payer: Self-pay | Admitting: Emergency Medicine

## 2017-10-31 ENCOUNTER — Ambulatory Visit (HOSPITAL_COMMUNITY)
Admission: RE | Admit: 2017-10-31 | Discharge: 2017-10-31 | Disposition: A | Discharge status: Home / Self Care | Payer: PPO | Source: Ambulatory Visit | Attending: Urology | Admitting: Urology

## 2017-10-31 ENCOUNTER — Encounter (HOSPITAL_COMMUNITY)
Admission: RE | Disposition: A | Discharge status: Home / Self Care | Payer: Self-pay | Source: Ambulatory Visit | Attending: Urology

## 2017-10-31 DIAGNOSIS — I739 Peripheral vascular disease, unspecified: Secondary | ICD-10-CM | POA: Diagnosis not present

## 2017-10-31 DIAGNOSIS — Z79899 Other long term (current) drug therapy: Secondary | ICD-10-CM | POA: Insufficient documentation

## 2017-10-31 DIAGNOSIS — N401 Enlarged prostate with lower urinary tract symptoms: Secondary | ICD-10-CM | POA: Insufficient documentation

## 2017-10-31 DIAGNOSIS — I2510 Atherosclerotic heart disease of native coronary artery without angina pectoris: Secondary | ICD-10-CM | POA: Diagnosis not present

## 2017-10-31 DIAGNOSIS — E890 Postprocedural hypothyroidism: Secondary | ICD-10-CM | POA: Insufficient documentation

## 2017-10-31 DIAGNOSIS — R338 Other retention of urine: Secondary | ICD-10-CM | POA: Diagnosis not present

## 2017-10-31 DIAGNOSIS — N138 Other obstructive and reflux uropathy: Secondary | ICD-10-CM | POA: Insufficient documentation

## 2017-10-31 DIAGNOSIS — Z5309 Procedure and treatment not carried out because of other contraindication: Secondary | ICD-10-CM | POA: Diagnosis not present

## 2017-10-31 DIAGNOSIS — Z8673 Personal history of transient ischemic attack (TIA), and cerebral infarction without residual deficits: Secondary | ICD-10-CM | POA: Insufficient documentation

## 2017-10-31 DIAGNOSIS — I10 Essential (primary) hypertension: Secondary | ICD-10-CM | POA: Insufficient documentation

## 2017-10-31 LAB — POCT I-STAT 4, (NA,K, GLUC, HGB,HCT)
Glucose, Bld: 88 mg/dL (ref 70–99)
HEMATOCRIT: 32 % — AB (ref 39.0–52.0)
HEMOGLOBIN: 10.9 g/dL — AB (ref 13.0–17.0)
POTASSIUM: 4 mmol/L (ref 3.5–5.1)
SODIUM: 142 mmol/L (ref 135–145)

## 2017-10-31 SURGERY — TURP (TRANSURETHRAL RESECTION OF PROSTATE)
Anesthesia: General

## 2017-10-31 MED ORDER — LIDOCAINE 2% (20 MG/ML) 5 ML SYRINGE
INTRAMUSCULAR | Status: AC
  Filled 2017-10-31: qty 5

## 2017-10-31 MED ORDER — ONDANSETRON HCL 4 MG/2ML IJ SOLN
INTRAMUSCULAR | Status: AC
  Filled 2017-10-31: qty 2

## 2017-10-31 MED ORDER — SODIUM CHLORIDE 0.9 % IR SOLN: Status: DC | PRN

## 2017-10-31 MED ORDER — LACTATED RINGERS IV SOLN
INTRAVENOUS | Status: DC | PRN
  Administered 2017-10-31: 07:00:00 via INTRAVENOUS

## 2017-10-31 MED ORDER — DEXAMETHASONE SODIUM PHOSPHATE 10 MG/ML IJ SOLN
INTRAMUSCULAR | Status: AC
  Filled 2017-10-31: qty 1

## 2017-10-31 MED ORDER — PROPOFOL 10 MG/ML IV BOLUS
INTRAVENOUS | Status: AC
  Filled 2017-10-31: qty 20

## 2017-10-31 MED ORDER — FENTANYL CITRATE (PF) 100 MCG/2ML IJ SOLN
INTRAMUSCULAR | Status: AC
  Filled 2017-10-31: qty 2

## 2017-10-31 MED ORDER — 0.9 % SODIUM CHLORIDE (POUR BTL) OPTIME: TOPICAL | Status: DC | PRN

## 2017-10-31 NOTE — Progress Notes (Signed)
Dr. Diona Fanti at bedside speaking to pt. Deciding to cancel surgery due to positive urine culture from pre-op. The office never called in antibiotic for pt to take prior to surgery. Dr. Shirley Muscat spoke to pt and informed him that he should pick up antibiotic from pharmacy that is now ordered and the office will call to the pt to reschedule for surgery. IV has been removed and pt is getting dressed.

## 2017-10-31 NOTE — Progress Notes (Signed)
Pt brought to main entrance with family. Alert and oriented, NAD. Pt ambulatory, riding home with daughter

## 2017-10-31 NOTE — H&P (Signed)
H&P  Chief Complaint: Urinary retention  History of Present Illness: 82 year old male presents for TURP for mgmt of catheter dependent BPH w/ obstruction. He haas been on preop augmentin for recent + culture.  Past Medical History:  Diagnosis Date  . BPH (benign prostatic hyperplasia)   . CAD (coronary artery disease)    cath 2011-mild non obstructive  . Dyspnea    with activity  . Elevated PSA    history of   . Gout    2009  . History of diverticulitis of colon   . History of pancreatitis   . History of TIAs   . Hyperlipidemia   . Hypertension   . Hypothyroidism   . Hypothyroidism   . Left ear hearing loss    wears right hearing aid   without hearing aides deaf  . Personal history of hyperthyroidism 2006   s/p 131I- Dr. Chalmers Cater, Hyperthyroid  . Renal insufficiency 2011  . TGA (transient global amnesia)     Past Surgical History:  Procedure Laterality Date  . CHOLECYSTECTOMY    . COLONOSCOPY    . IR FLUORO GUIDE CV LINE RIGHT  09/14/2017  . IR REMOVAL TUN CV CATH W/O FL  09/20/2017  . IR US GUIDE VASC ACCESS RIGHT  09/14/2017  . right ear  surgery     BAHA bone anchored  with screw  . STAPEDES SURGERY     LEFT EAR  . TRANSURETHRAL RESECTION OF PROSTATE      Home Medications:    Allergies:  Allergies  Allergen Reactions  . Lipitor [Atorvastatin]     diarrhea  . Other     Dairy products-diarrhea   . Spironolactone     gynecomastia  . Zocor [Simvastatin]     achy  . Coreg [Carvedilol] Diarrhea    Diarrhea and bradycardia  . Donepezil Hydrochloride Diarrhea    REACTION: diarrhea  . Hydrochlorothiazide Other (See Comments)    REACTION: Gout  . Razadyne [Galantamine Hydrobromide] Diarrhea    diarrhea    Family History  Problem Relation Age of Onset  . Heart disease Sister   . Dementia Mother   . Hypertension Other   . Colon cancer Neg Hx   . Stomach cancer Neg Hx   . Esophageal cancer Neg Hx   . Rectal cancer Neg Hx     Social History:  reports  that he has never smoked. He has never used smokeless tobacco. He reports that he does not drink alcohol or use drugs.  ROS: A complete review of systems was performed.  All systems are negative except for pertinent findings as noted.  Physical Exam:  Vital signs in last 24 hours: Temp:  [98.2 F (36.8 C)] 98.2 F (36.8 C) (09/09 0557) Pulse Rate:  [65] 65 (09/09 0557) Resp:  [18] 18 (09/09 0557) BP: (170)/(71) 170/71 (09/09 0557) SpO2:  [98 %] 98 % (09/09 0557) Weight:  [86.2 kg] 86.2 kg (09/09 0615) Constitutional:  Alert and oriented, No acute distress Cardiovascular: Regular rate  Respiratory: Normal respiratory effort GI: Abdomen is soft, nontender, nondistended, no abdominal masses Genitourinary: No CVAT. Normal male phallus, testes are descended bilaterally and non-tender and without masses, scrotum is normal in appearance without lesions or masses, perineum is normal on inspection. Lymphatic: No lymphadenopathy Neurologic: Grossly intact, no focal deficits Psychiatric: Normal mood and affect  Laboratory Data:  No results for input(s): WBC, HGB, HCT, PLT in the last 72 hours.  No results for input(s): NA, K, CL,  GLUCOSE, BUN, CALCIUM, CREATININE in the last 72 hours.  Invalid input(s): CO3   No results found for this or any previous visit (from the past 24 hour(s)). No results found for this or any previous visit (from the past 240 hour(s)).  Renal Function: Recent Labs    10/26/17 1235  CREATININE 2.59*   Estimated Creatinine Clearance: 26.4 mL/min (A) (by C-G formula based on SCr of 2.59 mg/dL (H)).  Radiologic Imaging: No results found.  Impression/Assessment:  BPH with retention  Plan:  TURP

## 2017-11-01 ENCOUNTER — Encounter (HOSPITAL_COMMUNITY): Payer: Self-pay | Admitting: *Deleted

## 2017-11-01 ENCOUNTER — Other Ambulatory Visit: Payer: Self-pay | Admitting: Urology

## 2017-11-01 ENCOUNTER — Other Ambulatory Visit: Payer: Self-pay

## 2017-11-03 MED ORDER — GENTAMICIN SULFATE 40 MG/ML IJ SOLN
130.0000 mg | INTRAVENOUS | Status: AC
  Administered 2017-11-04: 130 mg via INTRAVENOUS
  Filled 2017-11-03: qty 3.25

## 2017-11-04 ENCOUNTER — Other Ambulatory Visit: Payer: Self-pay

## 2017-11-04 ENCOUNTER — Ambulatory Visit (HOSPITAL_COMMUNITY): Payer: PPO | Admitting: Anesthesiology

## 2017-11-04 ENCOUNTER — Observation Stay (HOSPITAL_COMMUNITY)
Admission: RE | Admit: 2017-11-04 | Discharge: 2017-11-05 | Disposition: A | Discharge status: Home / Self Care | Payer: PPO | Source: Ambulatory Visit | Attending: Urology | Admitting: Urology

## 2017-11-04 ENCOUNTER — Encounter (HOSPITAL_COMMUNITY): Payer: Self-pay | Admitting: Anesthesiology

## 2017-11-04 ENCOUNTER — Encounter (HOSPITAL_COMMUNITY)
Admission: RE | Disposition: A | Discharge status: Home / Self Care | Payer: Self-pay | Source: Ambulatory Visit | Attending: Urology

## 2017-11-04 DIAGNOSIS — N138 Other obstructive and reflux uropathy: Secondary | ICD-10-CM | POA: Insufficient documentation

## 2017-11-04 DIAGNOSIS — I10 Essential (primary) hypertension: Secondary | ICD-10-CM | POA: Insufficient documentation

## 2017-11-04 DIAGNOSIS — I2510 Atherosclerotic heart disease of native coronary artery without angina pectoris: Secondary | ICD-10-CM | POA: Insufficient documentation

## 2017-11-04 DIAGNOSIS — N320 Bladder-neck obstruction: Secondary | ICD-10-CM | POA: Diagnosis not present

## 2017-11-04 DIAGNOSIS — N401 Enlarged prostate with lower urinary tract symptoms: Principal | ICD-10-CM | POA: Diagnosis present

## 2017-11-04 DIAGNOSIS — N400 Enlarged prostate without lower urinary tract symptoms: Secondary | ICD-10-CM | POA: Diagnosis not present

## 2017-11-04 DIAGNOSIS — E785 Hyperlipidemia, unspecified: Secondary | ICD-10-CM | POA: Insufficient documentation

## 2017-11-04 DIAGNOSIS — I739 Peripheral vascular disease, unspecified: Secondary | ICD-10-CM | POA: Insufficient documentation

## 2017-11-04 DIAGNOSIS — Z8673 Personal history of transient ischemic attack (TIA), and cerebral infarction without residual deficits: Secondary | ICD-10-CM | POA: Diagnosis not present

## 2017-11-04 DIAGNOSIS — E039 Hypothyroidism, unspecified: Secondary | ICD-10-CM | POA: Diagnosis not present

## 2017-11-04 DIAGNOSIS — R338 Other retention of urine: Secondary | ICD-10-CM

## 2017-11-04 DIAGNOSIS — M109 Gout, unspecified: Secondary | ICD-10-CM | POA: Insufficient documentation

## 2017-11-04 HISTORY — DX: Benign prostatic hyperplasia with lower urinary tract symptoms: N40.1

## 2017-11-04 HISTORY — DX: Other retention of urine: R33.8

## 2017-11-04 HISTORY — PX: TRANSURETHRAL RESECTION OF PROSTATE: SHX73

## 2017-11-04 SURGERY — TURP (TRANSURETHRAL RESECTION OF PROSTATE)
Anesthesia: General

## 2017-11-04 MED ORDER — FENTANYL CITRATE (PF) 100 MCG/2ML IJ SOLN
INTRAMUSCULAR | Status: AC
  Administered 2017-11-04: 50 ug via INTRAVENOUS
  Filled 2017-11-04: qty 2

## 2017-11-04 MED ORDER — ONDANSETRON HCL 4 MG/2ML IJ SOLN: 4.0000 mg | Freq: Once | INTRAMUSCULAR | Status: DC | PRN

## 2017-11-04 MED ORDER — FENTANYL CITRATE (PF) 100 MCG/2ML IJ SOLN
INTRAMUSCULAR | Status: AC
  Filled 2017-11-04: qty 2

## 2017-11-04 MED ORDER — BELLADONNA ALKALOIDS-OPIUM 16.2-30 MG RE SUPP
RECTAL | Status: AC
  Filled 2017-11-04: qty 1

## 2017-11-04 MED ORDER — LIDOCAINE 2% (20 MG/ML) 5 ML SYRINGE
INTRAMUSCULAR | Status: DC | PRN
  Administered 2017-11-04: 100 mg via INTRAVENOUS

## 2017-11-04 MED ORDER — ALFUZOSIN HCL ER 10 MG PO TB24
10.0000 mg | ORAL_TABLET | Freq: Every day | ORAL | Status: DC
  Filled 2017-11-04 (×2): qty 1

## 2017-11-04 MED ORDER — SODIUM CHLORIDE 0.9 % IR SOLN: 3000.0000 mL | Status: DC

## 2017-11-04 MED ORDER — AMLODIPINE BESYLATE 2.5 MG PO TABS
2.5000 mg | ORAL_TABLET | Freq: Every day | ORAL | Status: DC
  Administered 2017-11-05: 2.5 mg via ORAL
  Filled 2017-11-04: qty 1

## 2017-11-04 MED ORDER — ONDANSETRON HCL 4 MG/2ML IJ SOLN
INTRAMUSCULAR | Status: DC | PRN
  Administered 2017-11-04: 4 mg via INTRAVENOUS

## 2017-11-04 MED ORDER — LIDOCAINE 2% (20 MG/ML) 5 ML SYRINGE
INTRAMUSCULAR | Status: AC
  Filled 2017-11-04: qty 5

## 2017-11-04 MED ORDER — ACETAMINOPHEN 500 MG PO TABS
ORAL_TABLET | ORAL | Status: AC
  Filled 2017-11-04: qty 1

## 2017-11-04 MED ORDER — AMOXICILLIN-POT CLAVULANATE 250-125 MG PO TABS
1.0000 | ORAL_TABLET | Freq: Three times a day (TID) | ORAL | Status: DC
  Administered 2017-11-04 – 2017-11-05 (×4): 1 via ORAL
  Filled 2017-11-04 (×6): qty 1

## 2017-11-04 MED ORDER — OXYCODONE HCL 5 MG PO TABS
5.0000 mg | ORAL_TABLET | Freq: Once | ORAL | Status: AC | PRN
  Administered 2017-11-04: 5 mg via ORAL

## 2017-11-04 MED ORDER — PHENYLEPHRINE 40 MCG/ML (10ML) SYRINGE FOR IV PUSH (FOR BLOOD PRESSURE SUPPORT)
PREFILLED_SYRINGE | INTRAVENOUS | Status: DC | PRN
  Administered 2017-11-04 (×2): 40 ug via INTRAVENOUS

## 2017-11-04 MED ORDER — PROPOFOL 10 MG/ML IV BOLUS
INTRAVENOUS | Status: AC
  Filled 2017-11-04: qty 20

## 2017-11-04 MED ORDER — SENNOSIDES-DOCUSATE SODIUM 8.6-50 MG PO TABS
1.0000 | ORAL_TABLET | Freq: Two times a day (BID) | ORAL | Status: DC
  Administered 2017-11-04 – 2017-11-05 (×2): 1 via ORAL
  Filled 2017-11-04 (×3): qty 1

## 2017-11-04 MED ORDER — OXYCODONE HCL 5 MG PO TABS
ORAL_TABLET | ORAL | Status: AC
  Filled 2017-11-04: qty 1

## 2017-11-04 MED ORDER — BELLADONNA ALKALOIDS-OPIUM 16.2-60 MG RE SUPP: 1.0000 | Freq: Four times a day (QID) | RECTAL | Status: DC | PRN

## 2017-11-04 MED ORDER — LEVOTHYROXINE SODIUM 88 MCG PO TABS
88.0000 ug | ORAL_TABLET | Freq: Every day | ORAL | Status: DC
  Administered 2017-11-05: 88 ug via ORAL
  Filled 2017-11-04: qty 1

## 2017-11-04 MED ORDER — DEXAMETHASONE SODIUM PHOSPHATE 10 MG/ML IJ SOLN
INTRAMUSCULAR | Status: AC
  Filled 2017-11-04: qty 1

## 2017-11-04 MED ORDER — DEXAMETHASONE SODIUM PHOSPHATE 10 MG/ML IJ SOLN
INTRAMUSCULAR | Status: DC | PRN
  Administered 2017-11-04: 10 mg via INTRAVENOUS

## 2017-11-04 MED ORDER — PROPOFOL 10 MG/ML IV BOLUS
INTRAVENOUS | Status: DC | PRN
  Administered 2017-11-04: 30 mg via INTRAVENOUS
  Administered 2017-11-04: 140 mg via INTRAVENOUS

## 2017-11-04 MED ORDER — OXYCODONE HCL 5 MG/5ML PO SOLN
5.0000 mg | Freq: Once | ORAL | Status: AC | PRN
  Filled 2017-11-04: qty 5

## 2017-11-04 MED ORDER — FENTANYL CITRATE (PF) 100 MCG/2ML IJ SOLN
25.0000 ug | INTRAMUSCULAR | Status: DC | PRN
  Administered 2017-11-04 (×3): 50 ug via INTRAVENOUS

## 2017-11-04 MED ORDER — ACETAMINOPHEN 500 MG PO TABS
500.0000 mg | ORAL_TABLET | Freq: Two times a day (BID) | ORAL | Status: DC | PRN
  Administered 2017-11-04 (×2): 500 mg via ORAL
  Filled 2017-11-04: qty 1

## 2017-11-04 MED ORDER — LABETALOL HCL 300 MG PO TABS
300.0000 mg | ORAL_TABLET | Freq: Two times a day (BID) | ORAL | Status: DC
  Administered 2017-11-04 – 2017-11-05 (×2): 300 mg via ORAL
  Filled 2017-11-04 (×3): qty 1

## 2017-11-04 MED ORDER — ONDANSETRON HCL 4 MG/2ML IJ SOLN
INTRAMUSCULAR | Status: AC
  Filled 2017-11-04: qty 2

## 2017-11-04 MED ORDER — BELLADONNA ALKALOIDS-OPIUM 16.2-60 MG RE SUPP
RECTAL | Status: DC | PRN
  Administered 2017-11-04: 1 via RECTAL

## 2017-11-04 MED ORDER — FENTANYL CITRATE (PF) 100 MCG/2ML IJ SOLN
INTRAMUSCULAR | Status: DC | PRN
  Administered 2017-11-04 (×2): 50 ug via INTRAVENOUS

## 2017-11-04 MED ORDER — LACTATED RINGERS IV SOLN
INTRAVENOUS | Status: DC
  Administered 2017-11-04: 07:00:00 via INTRAVENOUS

## 2017-11-04 MED ORDER — FENTANYL CITRATE (PF) 100 MCG/2ML IJ SOLN: 25.0000 ug | INTRAMUSCULAR | Status: DC | PRN

## 2017-11-04 MED ORDER — SACCHAROMYCES BOULARDII 250 MG PO CAPS
250.0000 mg | ORAL_CAPSULE | Freq: Two times a day (BID) | ORAL | Status: DC
  Administered 2017-11-04 – 2017-11-05 (×2): 250 mg via ORAL
  Filled 2017-11-04 (×3): qty 1

## 2017-11-04 MED ORDER — 0.9 % SODIUM CHLORIDE (POUR BTL) OPTIME
TOPICAL | Status: DC | PRN
  Administered 2017-11-04: 1000 mL

## 2017-11-04 MED ORDER — PRAVASTATIN SODIUM 20 MG PO TABS
20.0000 mg | ORAL_TABLET | Freq: Every day | ORAL | Status: DC
  Administered 2017-11-05: 20 mg via ORAL
  Filled 2017-11-04: qty 1

## 2017-11-04 MED ORDER — FLUCONAZOLE 100 MG PO TABS
100.0000 mg | ORAL_TABLET | Freq: Every day | ORAL | Status: DC
  Administered 2017-11-04 – 2017-11-05 (×2): 100 mg via ORAL
  Filled 2017-11-04 (×2): qty 1

## 2017-11-04 MED ORDER — SODIUM CHLORIDE 0.9 % IR SOLN
Status: DC | PRN
  Administered 2017-11-04: 12000 mL via INTRAVESICAL

## 2017-11-04 MED ORDER — PHENYLEPHRINE 40 MCG/ML (10ML) SYRINGE FOR IV PUSH (FOR BLOOD PRESSURE SUPPORT)
PREFILLED_SYRINGE | INTRAVENOUS | Status: AC
  Filled 2017-11-04: qty 10

## 2017-11-04 SURGICAL SUPPLY — 21 items
BAG URINE DRAINAGE (UROLOGICAL SUPPLIES) ×3 IMPLANT
BAG URO CATCHER STRL LF (MISCELLANEOUS) ×3 IMPLANT
BLADE SURG 15 STRL LF DISP TIS (BLADE) IMPLANT
BLADE SURG 15 STRL SS (BLADE)
CATH FOLEY 3WAY 30CC 22FR (CATHETERS) ×3 IMPLANT
ELECT REM PT RETURN 15FT ADLT (MISCELLANEOUS) IMPLANT
EVACUATOR MICROVAS BLADDER (UROLOGICAL SUPPLIES) IMPLANT
GLOVE BIOGEL M 8.0 STRL (GLOVE) ×3 IMPLANT
GOWN STRL REUS W/ TWL XL LVL3 (GOWN DISPOSABLE) ×1 IMPLANT
GOWN STRL REUS W/TWL XL LVL3 (GOWN DISPOSABLE) ×2 IMPLANT
HOLDER FOLEY CATH W/STRAP (MISCELLANEOUS) ×3 IMPLANT
LOOP CUT BIPOLAR 24F LRG (ELECTROSURGICAL) ×3 IMPLANT
MANIFOLD NEPTUNE II (INSTRUMENTS) ×3 IMPLANT
PACK CYSTO (CUSTOM PROCEDURE TRAY) ×3 IMPLANT
SET ASPIRATION TUBING (TUBING) IMPLANT
SUT ETHILON 3 0 PS 1 (SUTURE) IMPLANT
SYR 30ML LL (SYRINGE) ×6 IMPLANT
TUBING CONNECTING 10 (TUBING) ×2 IMPLANT
TUBING CONNECTING 10' (TUBING) ×1
TUBING UROLOGY SET (TUBING) ×3 IMPLANT
WATER STERILE IRR 500ML POUR (IV SOLUTION) ×3 IMPLANT

## 2017-11-04 NOTE — Transfer of Care (Signed)
Immediate Anesthesia Transfer of Care Note  Patient: [name]  Procedure(s) Performed: TRANSURETHRAL RESECTION OF THE PROSTATE (TURP) (N/A )  Patient Location: PACU  Anesthesia Type:General  Level of Consciousness: awake  Airway & Oxygen Therapy: Patient Spontanous Breathing and Patient connected to face mask oxygen  Post-op Assessment: Report given to RN and Post -op Vital signs reviewed and stable  Post vital signs: Reviewed and stable  Last Vitals:  Vitals Value Taken Time  BP    Temp    Pulse 64 11/04/2017  8:48 AM  Resp    SpO2 100 % 11/04/2017  8:48 AM  Vitals shown include unvalidated device data.  Last Pain:  Vitals:   11/04/17 0618  TempSrc:   PainSc: 0-No pain      Patients Stated Pain Goal: 4 (75/43/60 6770)  Complications: No apparent anesthesia complications

## 2017-11-04 NOTE — Anesthesia Preprocedure Evaluation (Signed)
Anesthesia Evaluation  Patient identified by MRN, date of birth, ID band Patient awake    Reviewed: Allergy & Precautions, NPO status , Patient's Chart, lab work & pertinent test results  History of Anesthesia Complications Negative for: history of anesthetic complications  Airway Mallampati: II  TM Distance: >3 FB Neck ROM: Full    Dental no notable dental hx.    Pulmonary neg pulmonary ROS,    breath sounds clear to auscultation       Cardiovascular hypertension, + CAD and + Peripheral Vascular Disease   Rhythm:Regular Rate:Normal  Normal TTE 2016, normal MPS 2017   Neuro/Psych Depression TIA   GI/Hepatic negative GI ROS, Neg liver ROS,   Endo/Other  Hypothyroidism   Renal/GU Renal InsufficiencyRenal disease   Prostate cancer    Musculoskeletal negative musculoskeletal ROS (+)   Abdominal   Peds  Hematology negative hematology ROS (+)   Anesthesia Other Findings   Reproductive/Obstetrics negative OB ROS                             Anesthesia Physical Anesthesia Plan  ASA: III  Anesthesia Plan: General   Post-op Pain Management:    Induction: Intravenous  PONV Risk Score and Plan: 2 and Ondansetron, Dexamethasone and Treatment may vary due to age or medical condition  Airway Management Planned: LMA  Additional Equipment:   Intra-op Plan:   Post-operative Plan: Extubation in OR  Informed Consent: I have reviewed the patients History and Physical, chart, labs and discussed the procedure including the risks, benefits and alternatives for the proposed anesthesia with the patient or authorized representative who has indicated his/her understanding and acceptance.     Plan Discussed with:   Anesthesia Plan Comments:         Anesthesia Quick Evaluation

## 2017-11-04 NOTE — Progress Notes (Signed)
Pt doing well. CBI clear pink. Minimal discomfort.  Plan is to d/c cath for voiding trial in am. Home on diflucan and augmentin.

## 2017-11-04 NOTE — H&P (Signed)
H&P  Chief Complaint: Urinary retention  History of Present Illness: : 82 year old male presents for TURP for mgmt of catheter dependent BPH w/ obstruction. He haas been on preop augmentin for recent + culture. (Originally scheduled for 9.9 but cancelled as he was not on the abx)  Past Medical History:  Diagnosis Date  . BPH (benign prostatic hyperplasia)   . CAD (coronary artery disease)    cath 2011-mild non obstructive  . Dyspnea    with activity  . Elevated PSA    history of   . Gout    2009  . History of diverticulitis of colon   . History of pancreatitis   . History of TIAs   . Hyperlipidemia   . Hypertension   . Hypothyroidism   . Hypothyroidism   . Left ear hearing loss    wears right hearing aid   without hearing aides deaf  . Personal history of hyperthyroidism 2006   s/p 131I- Dr. Chalmers Cater, Hyperthyroid  . Renal insufficiency 2011  . TGA (transient global amnesia)     Past Surgical History:  Procedure Laterality Date  . CHOLECYSTECTOMY    . COLONOSCOPY    . IR FLUORO GUIDE CV LINE RIGHT  09/14/2017  . IR REMOVAL TUN CV CATH W/O FL  09/20/2017  . IR US GUIDE VASC ACCESS RIGHT  09/14/2017  . right ear  surgery     BAHA bone anchored  with screw  . STAPEDES SURGERY     LEFT EAR  . TRANSURETHRAL RESECTION OF PROSTATE      Home Medications:    Allergies:  Allergies  Allergen Reactions  . Lipitor [Atorvastatin]     diarrhea  . Other     Dairy products-diarrhea   . Spironolactone     gynecomastia  . Zocor [Simvastatin]     achy  . Coreg [Carvedilol] Diarrhea    Diarrhea and bradycardia  . Donepezil Hydrochloride Diarrhea    REACTION: diarrhea  . Hydrochlorothiazide Other (See Comments)    REACTION: Gout  . Razadyne [Galantamine Hydrobromide] Diarrhea    diarrhea    Family History  Problem Relation Age of Onset  . Heart disease Sister   . Dementia Mother   . Hypertension Other   . Colon cancer Neg Hx   . Stomach cancer Neg Hx   . Esophageal  cancer Neg Hx   . Rectal cancer Neg Hx     Social History:  reports that he has never smoked. He has never used smokeless tobacco. He reports that he does not drink alcohol or use drugs.  ROS: A complete review of systems was performed.  All systems are negative except for pertinent findings as noted.  Physical Exam:  Vital signs in last 24 hours: Temp:  [97.8 F (36.6 C)] 97.8 F (36.6 C) (09/13 0605) Pulse Rate:  [64] 64 (09/13 0605) Resp:  [16] 16 (09/13 0605) BP: (146)/(66) 146/66 (09/13 0605) SpO2:  [99 %] 99 % (09/13 0605) Weight:  [82.7 kg] 82.7 kg (09/13 0605) Constitutional:  Alert and oriented, No acute distress Cardiovascular: Regular rate  Respiratory: Normal respiratory effort GI: Abdomen is soft, nontender, nondistended, no abdominal masses Genitourinary: No CVAT. Normal male phallus, testes are descended bilaterally and non-tender and without masses, scrotum is normal in appearance without lesions or masses, perineum is normal on inspection. Lymphatic: No lymphadenopathy Neurologic: Grossly intact, no focal deficits Psychiatric: Normal mood and affect  Laboratory Data:  No results for input(s): WBC, HGB, HCT,  PLT in the last 72 hours.  No results for input(s): NA, K, CL, GLUCOSE, BUN, CALCIUM, CREATININE in the last 72 hours.  Invalid input(s): CO3   No results found for this or any previous visit (from the past 24 hour(s)). No results found for this or any previous visit (from the past 240 hour(s)).  Renal Function: No results for input(s): CREATININE in the last 168 hours. Estimated Creatinine Clearance: 26.4 mL/min (A) (by C-G formula based on SCr of 2.59 mg/dL (H)).  Radiologic Imaging: No results found.  Impression/Assessment:  Urinary retention  Plan:  TURP

## 2017-11-04 NOTE — Discharge Instructions (Signed)
Transurethral Resection of the Prostate ° °Care After ° °Refer to this sheet in the next few weeks. These discharge instructions provide you with general information on caring for yourself after you leave the hospital. Your caregiver may also give you specific instructions. Your treatment has been planned according to the most current medical practices available, but unavoidable complications sometimes occur. If you have any problems or questions after discharge, please call your caregiver. ° °HOME CARE INSTRUCTIONS  ° °Medications °· You may receive medicine for pain management. As your level of discomfort decreases, adjustments in your pain medicines may be made.  °· Take all medicines as directed.  °· You may be given a medicine (antibiotic) to kill germs following surgery. Finish all medicines. Let your caregiver know if you have any side effects or problems from the medicine.  °· If you are on aspirin, it would be best not to restart the aspirin until the blood in the urine clears °Hygiene °· You can take a shower after surgery.  °· You should not take a bath while you still have the urethral catheter. °Activity °· You will be encouraged to get out of bed as much as possible and increase your activity level as tolerated.  °· Spend the first week in and around your home. For 3 weeks, avoid the following:  °· Straining.  °· Running.  °· Strenuous work.  °· Walks longer than a few blocks.  °· Riding for extended periods.  °· Sexual relations.  °· Do not lift heavy objects (more than 20 pounds) for at least 1 month. When lifting, use your arms instead of your abdominal muscles.  °· You will be encouraged to walk as tolerated. Do not exert yourself. Increase your activity level slowly. Remember that it is important to keep moving after an operation of any type. This cuts down on the possibility of developing blood clots.  °· Your caregiver will tell you when you can resume driving and light housework. Discuss this  at your first office visit after discharge. °Diet °· No special diet is ordered after a TURP. However, if you are on a special diet for another medical problem, it should be continued.  °· Normal fluid intake is usually recommended.  °· Avoid alcohol and caffeinated drinks for 2 weeks. They irritate the bladder. Decaffeinated drinks are okay.  °· Avoid spicy foods.  °Bladder Function °· For the first 10 days, empty the bladder whenever you feel a definite desire. Do not try to hold the urine for long periods of time.  °· Urinating once or twice a night even after you are healed is not uncommon.  °· You may see some recurrence of blood in the urine after discharge from the hospital. This usually happens within 2 weeks after the procedure.If this occurs, force fluids again as you did in the hospital and reduce your activity.  °Bowel Function °· You may experience some constipation after surgery. This can be minimized by increasing fluids and fiber in your diet. Drink enough water and fluids to keep your urine clear or pale yellow.  °· A stool softener may be prescribed for use at home. Do not strain to move your bowels.  °· If you are requiring increased pain medicine, it is important that you take stool softeners to prevent constipation. This will help to promote proper healing by reducing the need to strain to move your bowels.  °Sexual Activity °· Semen movement in the opposite direction and into the bladder (  retrograde ejaculation) may occur. Since the semen passes into the bladder, cloudy urine can occur the first time you urinate after intercourse. Or, you may not have an ejaculation during erection. Ask your caregiver when you can resume sexual activity. Retrograde ejaculation and reduced semen discharge should not reduce one's pleasure of intercourse.  °Postoperative Visit °· Arrange the date and time of your after surgery visit with your caregiver.  °Return to Work °· After your recovery is complete, you will  be able to return to work and resume all activities. Your caregiver will inform you when you can return to work.  °Foley Catheter Care °A soft, flexible tube (Foley catheter) may have been placed in your bladder to drain urine and fluid. Follow these instructions: °Taking Care of the Catheter °· Keep the area where the catheter leaves your body clean.  °· Attach the catheter to the leg so there is no tension on the catheter.  °· Keep the drainage bag below the level of the bladder, but keep it OFF the floor.  °· Do not take long soaking baths. Your caregiver will give instructions about showering.  °· Wash your hands before touching ANYTHING related to the catheter or bag.  °· Using mild soap and warm water on a washcloth:  °· Clean the area closest to the catheter insertion site using a circular motion around the catheter.  °· Clean the catheter itself by wiping AWAY from the insertion site for several inches down the tube.  °· NEVER wipe upward as this could sweep bacteria up into the urethra (tube in your body that normally drains the bladder) and cause infection.  °· Place a small amount of sterile lubricant at the tip of the penis where the catheter is entering.  °Taking Care of the Drainage Bags °· Two drainage bags may be taken home: a large overnight drainage bag, and a smaller leg bag which fits underneath clothing.  °· It is okay to wear the overnight bag at any time, but NEVER wear the smaller leg bag at night.  °· Keep the drainage bag well below the level of your bladder. This prevents backflow of urine into the bladder and allows the urine to drain freely.  °· Anchor the tubing to your leg to prevent pulling or tension on the catheter. Use tape or a leg strap provided by the hospital.  °· Empty the drainage bag when it is 1/2 to 3/4 full. Wash your hands before and after touching the bag.  °· Periodically check the tubing for kinks to make sure there is no pressure on the tubing which could restrict  the flow of urine.  °Changing the Drainage Bags °· Cleanse both ends of the clean bag with alcohol before changing.  °· Pinch off the rubber catheter to avoid urine spillage during the disconnection.  °· Disconnect the dirty bag and connect the clean one.  °· Empty the dirty bag carefully to avoid a urine spill.  °· Attach the new bag to the leg with tape or a leg strap.  °Cleaning the Drainage Bags °· Whenever a drainage bag is disconnected, it must be cleaned quickly so it is ready for the next use.  °· Wash the bag in warm, soapy water.  °· Rinse the bag thoroughly with warm water.  °· Soak the bag for 30 minutes in a solution of white vinegar and water (1 cup vinegar to 1 quart warm water).  °· Rinse with warm water.  °SEEK MEDICAL   CARE IF:  °· You have chills or night sweats.  °· You are leaking around your catheter or have problems with your catheter. It is not uncommon to have sporadic leakage around your catheter as a result of bladder spasms. If the leakage stops, there is not much need for concern. If you are uncertain, call your caregiver.  °· You develop side effects that you think are coming from your medicines.  °SEEK IMMEDIATE MEDICAL CARE IF:  °· You are suddenly unable to urinate. Check to see if there are any kinks in the drainage tubing that may cause this. If you cannot find any kinks, call your caregiver immediately. This is an emergency.  °· You develop shortness of breath or chest pains.  °· Bleeding persists or clots develop in your urine.  °· You have a fever.  °· You develop pain in your back or over your lower belly (abdomen).  °· You develop pain or swelling in your legs.  °· Any problems you are having get worse rather than better.  °MAKE SURE YOU:  °· Understand these instructions.  °· Will watch your condition.  °· Will get help right away if you are not doing well or get worse.  °Document Released: 02/08/2005 Document Revised: 10/21/2010 Document Reviewed: 10/02/2008 °[employer]®  Patient Information ©2012 [employer].Transurethral Resection of the Prostate °Care After °Refer to this sheet in the next few weeks. These discharge instructions provide you with general information on caring for yourself after you leave the hospital. Your caregiver may also give you specific instructions. Your treatment has been planned according to the most current medical practices available, but unavoidable complications sometimes occur. If you have any problems or questions after discharge, please call your caregiver. °

## 2017-11-04 NOTE — Interval H&P Note (Signed)
History and Physical Interval Note:  11/04/2017 7:30 AM  Kenneth Williams  has presented today for surgery, with the diagnosis of BENIGN PROSTATIC HYPERPLASIA WITH RETENTION  The various methods of treatment have been discussed with the patient and family. After consideration of risks, benefits and other options for treatment, the patient has consented to  Procedure(s) with comments: [place] (TURP) (N/A) - 1 HR as a surgical intervention .  The patient's history has been reviewed, patient examined, no change in status, stable for surgery.  I have reviewed the patient's chart and labs.  Questions were answered to the patient's satisfaction.     Lillette Boxer Lilienne Weins

## 2017-11-04 NOTE — Op Note (Signed)
Preoperative diagnosis: 1. Bladder outlet obstruction secondary to BPH  Postoperative diagnosis:  1. Bladder outlet obstruction secondary to BPH  Procedure:  1. Cystoscopy 2. Transurethral resection  of the prostate  Surgeon: Lillette Boxer. Dustie Brittle, M.D.  Anesthesia: General  Complications: None  Drain: Foley catheter  EBL: Minimal  Specimens: 1. Prostate chips  Disposition of specimens: Pathology  Indication: Kenneth Williams is a patient with bladder outlet obstruction secondary to benign prostatic hyperplasia. He has had an indwelling foley catheter. After reviewing the management options for treatment, he elected to proceed with the above surgical procedure(s). We have discussed the potential benefits and risks of the procedure, side effects of the proposed treatment, the likelihood of the patient achieving the goals of the procedure, and any potential problems that might occur during the procedure or recuperation. Informed consent has been obtained.  Description of procedure:  The patient was identified in the holding area. He received preoperative antibiotics. He was then taken to the operating room. General anesthetic was administered.  The patient was then placed in the dorsal lithotomy position, prepped and draped in the usual sterile fashion. Timeout was then performed.  A resectoscope sheath was placed using the visual obturator.  The bladder was then systematically examined in its entirety. There was no evidence of  tumors, stones, or other mucosal pathology.The resectoscope, loop and telescope were then placed.   The ureteral orifices were identified so as to be avoided during the procedure.  The prostate adenoma was then resected utilizing loop cautery resection with the bipolar cutting loop.  The prostate adenoma from the bladder neck back to the verumontanum was resected beginning at the six o'clock position and then extended to include the right and left lobes of  the prostate and anterior prostate, respectively. Care was taken not to resect distal to the verumontanum.  Hemostasis was then achieved with the cautery and the bladder was emptied and reinspected with no significant bleeding noted at the end of the procedure.  Resected chips were irrigated from the bladder with the evacuator and sent to pathology.  A 3 way 22 Fr catheter was then placed into the bladder and placed on continuous bladder irrigation.  The patient appeared to tolerate the procedure well and without complications. The patient was able to be awakened and transferred to the recovery unit in satisfactory condition. He tolerated the procedure well.

## 2017-11-04 NOTE — Anesthesia Postprocedure Evaluation (Signed)
Anesthesia Post Note  Patient: AZARIUS LAMBSON  Procedure(s) Performed: TRANSURETHRAL RESECTION OF THE PROSTATE (TURP) (N/A )     Patient location during evaluation: PACU Anesthesia Type: General Level of consciousness: awake and alert Pain management: pain level controlled Vital Signs Assessment: post-procedure vital signs reviewed and stable Respiratory status: spontaneous breathing, nonlabored ventilation, respiratory function stable and patient connected to nasal cannula oxygen Cardiovascular status: blood pressure returned to baseline and stable Postop Assessment: no apparent nausea or vomiting Anesthetic complications: no    Last Vitals:  Vitals:   11/04/17 1300 11/04/17 1400  BP: (!) 147/88 126/63  Pulse: 77 66  Resp: 12 18  Temp: 36.5 C 36.6 C  SpO2: 100% 100%    Last Pain:  Vitals:   11/04/17 1400  TempSrc:   PainSc: 3                  Lidia Collum

## 2017-11-04 NOTE — Anesthesia Procedure Notes (Addendum)
Procedure Name: LMA Insertion Date/Time: 11/04/2017 7:55 AM Performed by: Sharlette Dense, CRNA Patient Re-evaluated:Patient Re-evaluated prior to induction Oxygen Delivery Method: Circle system utilized Preoxygenation: Pre-oxygenation with 100% oxygen Induction Type: IV induction Ventilation: Mask ventilation without difficulty LMA: LMA inserted LMA Size: 4.0 Number of attempts: 1 Placement Confirmation: positive ETCO2 and breath sounds checked- equal and bilateral Tube secured with: Tape Dental Injury: Teeth and Oropharynx as per pre-operative assessment

## 2017-11-04 NOTE — Progress Notes (Signed)
Dr. Kerin Perna notified that pt has been weak and lightheaded this morning.

## 2017-11-05 ENCOUNTER — Encounter (HOSPITAL_COMMUNITY): Payer: Self-pay | Admitting: Urology

## 2017-11-05 DIAGNOSIS — N401 Enlarged prostate with lower urinary tract symptoms: Secondary | ICD-10-CM | POA: Diagnosis not present

## 2017-11-05 MED ORDER — FLUCONAZOLE 100 MG PO TABS: 100.0000 mg | ORAL_TABLET | Freq: Every day | ORAL | 0 refills | Status: AC

## 2017-11-05 MED ORDER — TRAMADOL HCL 50 MG PO TABS: 50.0000 mg | ORAL_TABLET | Freq: Four times a day (QID) | ORAL | 0 refills | Status: DC | PRN

## 2017-11-05 MED ORDER — AMOXICILLIN-POT CLAVULANATE 250-125 MG PO TABS: 1.0000 | ORAL_TABLET | Freq: Three times a day (TID) | ORAL | 0 refills | Status: AC

## 2017-11-05 NOTE — Progress Notes (Signed)
Discharge instructions given to patient and daughter.  Patient is very excited about being discharged

## 2017-11-05 NOTE — Progress Notes (Signed)
Urology Inpatient Progress Report  BENIGN PROSTATIC HYPERPLASIA WITH RETENTION  Procedure(s): TRANSURETHRAL RESECTION OF THE PROSTATE (TURP)  1 Day Post-Op   Intv/Subj: No acute events overnight. Patient is without complaint. Foley out this AM - voiding small frequent amounts  Active Problems:   Enlarged prostate with urinary retention  Current Facility-Administered Medications  Medication Dose Route Frequency Provider Last Rate Last Dose  . acetaminophen (TYLENOL) tablet 500 mg  500 mg Oral BID PRN Franchot Gallo, MD   500 mg at 11/04/17 2129  . alfuzosin (UROXATRAL) 24 hr tablet 10 mg  10 mg Oral Q breakfast Franchot Gallo, MD      . amLODipine (NORVASC) tablet 2.5 mg  2.5 mg Oral Daily Franchot Gallo, MD      . amoxicillin-clavulanate (AUGMENTIN) 250-125 MG per tablet 1 tablet  1 tablet Oral Lebron Quam, MD   1 tablet at 11/05/17 [phone]  . fluconazole (DIFLUCAN) tablet 100 mg  100 mg Oral Daily Franchot Gallo, MD   100 mg at 11/04/17 1659  . labetalol (NORMODYNE) tablet 300 mg  300 mg Oral BID Franchot Gallo, MD   300 mg at 11/04/17 2129  . levothyroxine (SYNTHROID, LEVOTHROID) tablet 88 mcg  88 mcg Oral QAC breakfast Franchot Gallo, MD      . opium-belladonna (B&O SUPPRETTES) 16.2-60 MG suppository 1 suppository  1 suppository Rectal Q6H PRN Franchot Gallo, MD      . pravastatin (PRAVACHOL) tablet 20 mg  20 mg Oral Daily Dahlstedt, Annie Main, MD      . saccharomyces boulardii (FLORASTOR) capsule 250 mg  250 mg Oral BID Franchot Gallo, MD   250 mg at 11/04/17 2129  . senna-docusate (Senokot-S) tablet 1 tablet  1 tablet Oral BID Franchot Gallo, MD   1 tablet at 11/04/17 2129  . sodium chloride irrigation 0.9 % 3,000 mL  3,000 mL Irrigation Continuous Franchot Gallo, MD         Objective: Vital: Vitals:   11/04/17 1400 11/04/17 1434 11/04/17 2043 11/05/17 0519  BP: 126/63 (!) 150/57 (!) 133/58 (!) 168/66  Pulse: 66 63 69 65   Resp: 18 16 16 16   Temp: 97.8 F (36.6 C) 98.1 F (36.7 C) 98.8 F (37.1 C) 98.2 F (36.8 C)  TempSrc:  Oral Oral Oral  SpO2: 100% 98% 96% 96%  Weight:  82.7 kg    Height:  6\' 2"  (1.88 m)     I/Os: I/O last 3 completed shifts: In: 7140 [P.O.:840; I.V.:950; Other:5250; IV Piggyback:100] Out: 81829 [Urine:13150]  Physical Exam:  General: Patient is in no apparent distress Lungs: Normal respiratory effort, chest expands symmetrically. Foley: out  - urine dark.  Ext: lower extremities symmetric  Lab Results: No results for input(s): WBC, HGB, HCT in the last 72 hours. No results for input(s): NA, K, CL, CO2, GLUCOSE, BUN, CREATININE, CALCIUM in the last 72 hours. No results for input(s): LABPT, INR in the last 72 hours. No results for input(s): LABURIN in the last 72 hours. Results for orders placed or performed during the hospital encounter of 09/06/17  Blood culture (routine x 2)     Status: Abnormal   Collection Time: 09/06/17  3:48 PM  Result Value Ref Range Status   Specimen Description   Final    BLOOD LEFT WRIST Performed at [facility] [address], [place], [place] [address]    Special Requests   Final    BOTTLES DRAWN AEROBIC AND ANAEROBIC Blood Culture results may not be optimal  due to an inadequate volume of blood received in culture bottles Performed at [facility] [address] [place], [place] [address]    Culture  Setup Time   Final    GRAM NEGATIVE RODS IN BOTH AEROBIC AND ANAEROBIC BOTTLES CRITICAL RESULT CALLED TO, READ BACK BY AND VERIFIED WITH: Damian Leavell [id number] MLM Performed at [facility], [address], [place], [place] [address]    Culture ESCHERICHIA COLI (A)  Final   Report Status 09/09/2017 FINAL  Final   Organism ID, Bacteria ESCHERICHIA COLI  Final      Susceptibility   Escherichia coli - MIC*    AMPICILLIN >=32 RESISTANT Resistant     CEFAZOLIN <=4 SENSITIVE Sensitive      CEFEPIME <=1 SENSITIVE Sensitive     CEFTAZIDIME <=1 SENSITIVE Sensitive     CEFTRIAXONE <=1 SENSITIVE Sensitive     CIPROFLOXACIN <=0.25 SENSITIVE Sensitive     GENTAMICIN <=1 SENSITIVE Sensitive     IMIPENEM <=0.25 SENSITIVE Sensitive     TRIMETH/SULFA <=20 SENSITIVE Sensitive     AMPICILLIN/SULBACTAM 16 INTERMEDIATE Intermediate     PIP/TAZO <=4 SENSITIVE Sensitive     Extended ESBL NEGATIVE Sensitive     * ESCHERICHIA COLI  Blood Culture ID Panel (Reflexed)     Status: Abnormal   Collection Time: 09/06/17  3:48 PM  Result Value Ref Range Status   Enterococcus species NOT DETECTED NOT DETECTED Final   Listeria monocytogenes NOT DETECTED NOT DETECTED Final   Staphylococcus species NOT DETECTED NOT DETECTED Final   Staphylococcus aureus NOT DETECTED NOT DETECTED Final   Streptococcus species NOT DETECTED NOT DETECTED Final   Streptococcus agalactiae NOT DETECTED NOT DETECTED Final   Streptococcus pneumoniae NOT DETECTED NOT DETECTED Final   Streptococcus pyogenes NOT DETECTED NOT DETECTED Final   Acinetobacter baumannii NOT DETECTED NOT DETECTED Final   Enterobacteriaceae species DETECTED (A) NOT DETECTED Final    Comment: Enterobacteriaceae represent a large family of gram-negative bacteria, not a single organism. CRITICAL RESULT CALLED TO, READ BACK BY AND VERIFIED WITH: PHARMD J GADHIA [id number] MLM    Enterobacter cloacae complex NOT DETECTED NOT DETECTED Final   Escherichia coli DETECTED (A) NOT DETECTED Final    Comment: CRITICAL RESULT CALLED TO, READ BACK BY AND VERIFIED WITH: PHARMD J GADHIA [id number] 0921 MLM    Klebsiella oxytoca NOT DETECTED NOT DETECTED Final   Klebsiella pneumoniae NOT DETECTED NOT DETECTED Final   Proteus species NOT DETECTED NOT DETECTED Final   Serratia marcescens NOT DETECTED NOT DETECTED Final   Carbapenem resistance NOT DETECTED NOT DETECTED Final   Haemophilus influenzae NOT DETECTED NOT DETECTED Final   Neisseria meningitidis NOT DETECTED  NOT DETECTED Final   Pseudomonas aeruginosa NOT DETECTED NOT DETECTED Final   Candida albicans NOT DETECTED NOT DETECTED Final   Candida glabrata NOT DETECTED NOT DETECTED Final   Candida krusei NOT DETECTED NOT DETECTED Final   Candida parapsilosis NOT DETECTED NOT DETECTED Final   Candida tropicalis NOT DETECTED NOT DETECTED Final    Comment: Performed at [facility], [place] [address] [place], [place] [address]  Blood culture (routine x 2)     Status: None   Collection Time: 09/06/17  3:53 PM  Result Value Ref Range Status   Specimen Description   Final    BLOOD RIGHT WRIST Performed at [facility] [address], [place], [place] [address]    Special Requests   Final  BOTTLES DRAWN AEROBIC AND ANAEROBIC Blood Culture results may not be optimal due to an excessive volume of blood received in culture bottles Performed at [facility], [place] [address] [place], [place] [address]    Culture   Final    NO GROWTH 5 DAYS Performed at [facility], [place] [address], [place], [place] [address]    Report Status 09/11/2017 FINAL  Final  Urine culture     Status: Abnormal   Collection Time: 09/06/17  7:24 PM  Result Value Ref Range Status   Specimen Description   Final    URINE, CLEAN CATCH Performed at [facility], [place] [address], [place], [place] [address]    Special Requests   Final    Normal Performed at [facility], [place] [address], [place], [place] [address]    Culture (A)  Final    60,000 COLONIES/mL ESCHERICHIA COLI 60,000 COLONIES/mL ENTEROCOCCUS FAECALIS    Report Status 09/10/2017 FINAL  Final   Organism ID, Bacteria ESCHERICHIA COLI (A)  Final   Organism ID, Bacteria ENTEROCOCCUS FAECALIS (A)  Final      Susceptibility   Escherichia coli - MIC*    AMPICILLIN >=32 RESISTANT Resistant     CEFAZOLIN <=4 SENSITIVE Sensitive     CEFTRIAXONE <=1 SENSITIVE Sensitive     CIPROFLOXACIN  <=0.25 SENSITIVE Sensitive     GENTAMICIN <=1 SENSITIVE Sensitive     IMIPENEM <=0.25 SENSITIVE Sensitive     NITROFURANTOIN <=16 SENSITIVE Sensitive     TRIMETH/SULFA <=20 SENSITIVE Sensitive     AMPICILLIN/SULBACTAM 16 INTERMEDIATE Intermediate     PIP/TAZO <=4 SENSITIVE Sensitive     Extended ESBL NEGATIVE Sensitive     * 60,000 COLONIES/mL ESCHERICHIA COLI   Enterococcus faecalis - MIC*    AMPICILLIN <=2 SENSITIVE Sensitive     LEVOFLOXACIN >=8 RESISTANT Resistant     NITROFURANTOIN <=16 SENSITIVE Sensitive     VANCOMYCIN 1 SENSITIVE Sensitive     * 60,000 COLONIES/mL ENTEROCOCCUS FAECALIS  Culture, blood (routine x 2)     Status: None   Collection Time: 09/08/17  5:42 AM  Result Value Ref Range Status   Specimen Description   Final    BLOOD RIGHT ANTECUBITAL Performed at [facility] [address], [place], [place] [address]    Special Requests   Final    BOTTLES DRAWN AEROBIC AND ANAEROBIC Blood Culture adequate volume Performed at [facility] [address] [place], [place] [address]    Culture   Final    NO GROWTH 5 DAYS Performed at [facility], [place] [address], [place], [place] [address]    Report Status 09/13/2017 FINAL  Final  Culture, blood (routine x 2)     Status: None   Collection Time: 09/08/17  5:42 AM  Result Value Ref Range Status   Specimen Description   Final    BLOOD RIGHT HAND Performed at [facility] [address], [place], [place] [address]    Special Requests   Final    BOTTLES DRAWN AEROBIC AND ANAEROBIC Blood Culture adequate volume Performed at [facility] [address], [place], [place] [address]    Culture   Final    NO GROWTH 5 DAYS Performed at [facility], [place] [address] [place], [place] [address]    Report Status 09/13/2017 FINAL  Final    Studies/Results: No results found.  Assessment: Procedure(s):  TRANSURETHRAL RESECTION OF  THE PROSTATE (TURP), 1 Day Post-Op  doing well.  Plan: Foley out, voiding small amts.  Will re-eval in several hours to ensure with PVR. Plan for discharge around lunch.   Louis Meckel, MD Urology 11/05/2017, 9:16 AM

## 2017-11-05 NOTE — Discharge Summary (Signed)
[facility] Discharge Summary  Admit date: 11/04/2017  Discharge date and time: 11/05/17   Discharge to: Home  Discharge Service: Urology  Discharge Attending Physician:  Dr. Louis Meckel  Discharge  Diagnoses: BPH w/ obstruction   Secondary Diagnosis: Active Problems:   Enlarged prostate with urinary retention   OR Procedures: Procedure(s): TRANSURETHRAL RESECTION OF THE PROSTATE (TURP) 11/04/2017   Ancillary Procedures: None   Discharge Day Services: The patient was seen and examined by the Urology team both in the morning and immediately prior to discharge.  Vital signs and laboratory values were stable and within normal limits.  The physical exam was benign and unchanged and all surgical wounds were examined.  Discharge instructions were explained and all questions answered.  Subjective  No acute events overnight. Pain Controlled. No fever or chills.  Objective Patient Vitals for the past 8 hrs:  BP Temp Temp src Pulse Resp SpO2  11/05/17 0519 (!) 168/66 98.2 F (36.8 C) Oral 65 16 96 %   No intake/output data recorded.  General Appearance:        No acute distress Lungs:                       Normal work of breathing on room air Heart:                                Regular rate and rhythm Abdomen:                         Soft, non-tender, non-distended Extremities:                      Warm and well perfused GU:      foley catheter removed   Hospital Course:  The patient underwent a TURP on 11/04/2017.  The patient tolerated the procedure well, was extubated in the OR, and afterwards was taken to the PACU for routine post-surgical care. When stable the patient was transferred to the floor.   he patient did well postoperatively.  The patient's diet was slowly advanced and at the time of discharge was tolerating a regular diet.  The patient was discharged home 1 Day Post-Op, at which point was tolerating a regular solid diet, was able to void spontaneously, have  adequate pain control with P.O. pain medication, and could ambulate without difficulty. The patient will follow up with [place] for post op check.   Foley catheter removed on POD1 AM. He was able to spontaneously void pink urine with low PVRs , ~50-60cc.   He will complete a 7 day course of Augmentin and 3 day course of Diflucan at discharge.   Condition at Discharge: Improved  Discharge Medications:

## 2017-11-07 ENCOUNTER — Telehealth: Payer: Self-pay | Admitting: Internal Medicine

## 2017-11-07 NOTE — Telephone Encounter (Signed)
Patient follow up with urology.

## 2017-11-09 ENCOUNTER — Ambulatory Visit: Payer: PPO | Admitting: Internal Medicine

## 2017-11-11 DIAGNOSIS — N3000 Acute cystitis without hematuria: Secondary | ICD-10-CM | POA: Diagnosis not present

## 2017-12-05 DIAGNOSIS — R338 Other retention of urine: Secondary | ICD-10-CM | POA: Diagnosis not present

## 2017-12-05 DIAGNOSIS — N401 Enlarged prostate with lower urinary tract symptoms: Secondary | ICD-10-CM | POA: Diagnosis not present

## 2017-12-05 DIAGNOSIS — N390 Urinary tract infection, site not specified: Secondary | ICD-10-CM | POA: Diagnosis not present

## 2017-12-06 DIAGNOSIS — N184 Chronic kidney disease, stage 4 (severe): Secondary | ICD-10-CM | POA: Diagnosis not present

## 2017-12-06 DIAGNOSIS — M109 Gout, unspecified: Secondary | ICD-10-CM | POA: Diagnosis not present

## 2017-12-06 DIAGNOSIS — R7989 Other specified abnormal findings of blood chemistry: Secondary | ICD-10-CM | POA: Diagnosis not present

## 2017-12-06 DIAGNOSIS — R630 Anorexia: Secondary | ICD-10-CM | POA: Diagnosis not present

## 2017-12-06 DIAGNOSIS — I129 Hypertensive chronic kidney disease with stage 1 through stage 4 chronic kidney disease, or unspecified chronic kidney disease: Secondary | ICD-10-CM | POA: Diagnosis not present

## 2017-12-06 DIAGNOSIS — N400 Enlarged prostate without lower urinary tract symptoms: Secondary | ICD-10-CM | POA: Diagnosis not present

## 2017-12-12 ENCOUNTER — Ambulatory Visit (INDEPENDENT_AMBULATORY_CARE_PROVIDER_SITE_OTHER): Payer: PPO | Admitting: Internal Medicine

## 2017-12-12 ENCOUNTER — Encounter

## 2017-12-12 ENCOUNTER — Encounter: Payer: Self-pay | Admitting: Internal Medicine

## 2017-12-12 VITALS — BP 152/86 | HR 75 | Temp 97.9°F | Ht 74.0 in | Wt 189.0 lb

## 2017-12-12 DIAGNOSIS — N401 Enlarged prostate with lower urinary tract symptoms: Secondary | ICD-10-CM | POA: Diagnosis not present

## 2017-12-12 DIAGNOSIS — E611 Iron deficiency: Secondary | ICD-10-CM

## 2017-12-12 DIAGNOSIS — R350 Frequency of micturition: Secondary | ICD-10-CM | POA: Diagnosis not present

## 2017-12-12 DIAGNOSIS — I10 Essential (primary) hypertension: Secondary | ICD-10-CM | POA: Diagnosis not present

## 2017-12-12 DIAGNOSIS — R634 Abnormal weight loss: Secondary | ICD-10-CM

## 2017-12-12 DIAGNOSIS — Z23 Encounter for immunization: Secondary | ICD-10-CM

## 2017-12-12 DIAGNOSIS — N183 Chronic kidney disease, stage 3 unspecified: Secondary | ICD-10-CM

## 2017-12-12 MED ORDER — FLUTICASONE PROPIONATE 50 MCG/ACT NA SUSP: 2.0000 | Freq: Every day | NASAL | 6 refills | Status: AC

## 2017-12-12 NOTE — Assessment & Plan Note (Signed)
TSH 

## 2017-12-12 NOTE — Addendum Note (Signed)
Addended by: Karren Cobble on: 12/12/2017 08:47 AM   Modules accepted: Orders

## 2017-12-12 NOTE — Progress Notes (Signed)
Subjective:  Patient ID: Kenneth Williams, male    DOB: 02-09-1936  Age: 82 y.o. MRN: [id number]  CC: No chief complaint on file.   HPI Kenneth Williams presents for wt loss, HTN, BPH f/u The patient underwent a TURP on 11/04/2017 C/o wt loss, fatigue, nasal d/c  Outpatient Medications Prior to Visit  Medication Sig Dispense Refill  . acetaminophen (TYLENOL) 500 MG tablet Take 500 mg by mouth 2 (two) times daily as needed for mild pain.    Marland Kitchen amLODipine (NORVASC) 2.5 MG tablet Take 1 tablet (2.5 mg total) by mouth daily. 30 tablet 11  . cholecalciferol (VITAMIN D) 1000 UNITS tablet Take 1,000 Units by mouth daily.     . clotrimazole-betamethasone (LOTRISONE) cream Apply 1 application topically 2 (two) times daily. 45 g 1  . colchicine 0.6 MG tablet TAKE 1 TABLET (0.6 MG TOTAL) BY MOUTH 3 (THREE) TIMES DAILY AS NEEDED. FOR GOUT 60 tablet 2  . Cyanocobalamin (VITAMIN B 12 PO) Take 1 tablet by mouth daily.      Marland Kitchen labetalol (NORMODYNE) 300 MG tablet Take 1 tablet (300 mg total) by mouth 2 (two) times daily. 60 tablet 11  . levothyroxine (SYNTHROID, LEVOTHROID) 88 MCG tablet TAKE 1 TABLET (88 MCG TOTAL) BY MOUTH DAILY. 30 tablet 11  . Niacin (VITAMIN B-3 PO) Take 1 tablet by mouth daily.    . polyethylene glycol (MIRALAX / GLYCOLAX) packet Take 17 g by mouth daily as needed for mild constipation. 14 each 0  . pravastatin (PRAVACHOL) 20 MG tablet Take 1 tablet (20 mg total) by mouth daily. 90 tablet 3  . saccharomyces boulardii (FLORASTOR) 250 MG capsule Take 1 capsule (250 mg total) by mouth 2 (two) times daily. 60 capsule 0  . alfuzosin (UROXATRAL) 10 MG 24 hr tablet TAKE 1 TABLET (10 MG TOTAL) BY MOUTH DAILY WITH BREAKFAST. 90 tablet 1  . Amino Acids-Protein Hydrolys (FEEDING SUPPLEMENT, PRO-STAT SUGAR FREE 64,) LIQD Take 30 mLs by mouth 2 (two) times daily. 900 mL 0  . finasteride (PROSCAR) 5 MG tablet TAKE 1 TABLET (5 MG TOTAL) BY MOUTH DAILY. 90 tablet 3  . senna-docusate (SENOKOT-S) 8.6-50  MG tablet Take 1 tablet by mouth 2 (two) times daily. 60 tablet 0  . traMADol (ULTRAM) 50 MG tablet Take 1 tablet (50 mg total) by mouth every 6 (six) hours as needed. 10 tablet 0   No facility-administered medications prior to visit.     ROS: Review of Systems  Constitutional: Positive for fatigue and unexpected weight change. Negative for appetite change.  HENT: Negative for congestion, nosebleeds, sneezing, sore throat and trouble swallowing.   Eyes: Negative for itching and visual disturbance.  Respiratory: Negative for cough.   Cardiovascular: Negative for chest pain, palpitations and leg swelling.  Gastrointestinal: Negative for abdominal distention, blood in stool, diarrhea and nausea.  Genitourinary: Positive for frequency and urgency. Negative for hematuria.  Musculoskeletal: Negative for back pain, gait problem, joint swelling and neck pain.  Skin: Negative for rash.  Neurological: Negative for dizziness, tremors, speech difficulty and weakness.  Psychiatric/Behavioral: Negative for agitation, dysphoric mood, sleep disturbance and suicidal ideas. The patient is not nervous/anxious.     Objective:  BP (!) 152/86 (BP Location: Left Arm, Patient Position: Sitting, Cuff Size: Normal)   Pulse 75   Temp 97.9 F (36.6 C) (Oral)   Ht 6\' 2"  (1.88 m)   Wt 189 lb (85.7 kg)   SpO2 99%   BMI 24.27 kg/m  BP Readings from Last 3 Encounters:  12/12/17 (!) 152/86  11/05/17 (!) 156/65  10/31/17 (!) 170/71    Wt Readings from Last 3 Encounters:  12/12/17 189 lb (85.7 kg)  11/04/17 182 lb 6 oz (82.7 kg)  10/31/17 190 lb (86.2 kg)    Physical Exam  Constitutional: He is oriented to person, place, and time. He appears well-developed. No distress.  NAD  HENT:  Mouth/Throat: Oropharynx is clear and moist.  Eyes: Pupils are equal, round, and reactive to light. Conjunctivae are normal.  Neck: Normal range of motion. No JVD present. No thyromegaly present.  Cardiovascular: Normal  rate, regular rhythm, normal heart sounds and intact distal pulses. Exam reveals no gallop and no friction rub.  No murmur heard. Pulmonary/Chest: Effort normal and breath sounds normal. No respiratory distress. He has no wheezes. He has no rales. He exhibits no tenderness.  Abdominal: Soft. Bowel sounds are normal. He exhibits no distension and no mass. There is no tenderness. There is no rebound and no guarding.  Musculoskeletal: Normal range of motion. He exhibits no edema or tenderness.  Lymphadenopathy:    He has no cervical adenopathy.  Neurological: He is alert and oriented to person, place, and time. He has normal reflexes. No cranial nerve deficit. He exhibits normal muscle tone. He displays a negative Romberg sign. Coordination and gait normal.  Skin: Skin is warm and dry. No rash noted.  Psychiatric: He has a normal mood and affect. His behavior is normal. Judgment and thought content normal.  Thin, but looks better Dry skin  Lab Results  Component Value Date   WBC 9.0 10/26/2017   HGB 10.9 (L) 10/31/2017   HCT 32.0 (L) 10/31/2017   PLT 285 10/26/2017   GLUCOSE 88 10/31/2017   CHOL 211 (H) 08/29/2017   TRIG 138.0 08/29/2017   HDL 37.30 (L) 08/29/2017   LDLDIRECT 123.0 03/25/2014   LDLCALC 146 (H) 08/29/2017   ALT 31 09/19/2017   AST 23 09/19/2017   NA 142 10/31/2017   K 4.0 10/31/2017   CL 109 10/26/2017   CREATININE 2.59 (H) 10/26/2017   BUN 40 (H) 10/26/2017   CO2 27 10/26/2017   TSH 1.69 08/29/2017   PSA 1.73 05/18/2016   INR 1.03 09/13/2017    No results found.  Assessment & Plan:   There are no diagnoses linked to this encounter.   No orders of the defined types were placed in this encounter.    Follow-up: No follow-ups on file.  Walker Kehr, MD

## 2017-12-12 NOTE — Assessment & Plan Note (Addendum)
Stage 3-4 The patient underwent a TURP on 11/04/2017 S/p nephrology consult 2019 SBP 130-140 at home

## 2017-12-12 NOTE — Assessment & Plan Note (Signed)
BP Readings from Last 3 Encounters:  12/12/17 (!) 152/86  11/05/17 (!) 156/65  10/31/17 (!) 170/71  The patient underwent a TURP on 11/04/2017 S/p nephrology consult 2019 SBP 130-140 at home

## 2017-12-12 NOTE — Assessment & Plan Note (Signed)
The patient underwent a TURP on 11/04/2017 Slow improvement

## 2017-12-16 ENCOUNTER — Ambulatory Visit: Payer: PPO

## 2017-12-23 ENCOUNTER — Other Ambulatory Visit: Payer: Self-pay | Admitting: Internal Medicine

## 2018-01-13 ENCOUNTER — Other Ambulatory Visit: Payer: Self-pay

## 2018-01-13 MED ORDER — LABETALOL HCL 300 MG PO TABS: 300.0000 mg | ORAL_TABLET | Freq: Two times a day (BID) | ORAL | 11 refills | Status: DC

## 2018-01-16 DIAGNOSIS — R8279 Other abnormal findings on microbiological examination of urine: Secondary | ICD-10-CM | POA: Diagnosis not present

## 2018-01-16 DIAGNOSIS — R338 Other retention of urine: Secondary | ICD-10-CM | POA: Diagnosis not present

## 2018-01-16 DIAGNOSIS — N401 Enlarged prostate with lower urinary tract symptoms: Secondary | ICD-10-CM | POA: Diagnosis not present

## 2018-01-18 ENCOUNTER — Ambulatory Visit: Payer: PPO | Admitting: Internal Medicine

## 2018-01-25 ENCOUNTER — Encounter: Payer: Self-pay | Admitting: Internal Medicine

## 2018-01-25 ENCOUNTER — Ambulatory Visit (INDEPENDENT_AMBULATORY_CARE_PROVIDER_SITE_OTHER): Payer: PPO | Admitting: Internal Medicine

## 2018-01-25 ENCOUNTER — Other Ambulatory Visit (INDEPENDENT_AMBULATORY_CARE_PROVIDER_SITE_OTHER): Payer: PPO

## 2018-01-25 DIAGNOSIS — R634 Abnormal weight loss: Secondary | ICD-10-CM

## 2018-01-25 DIAGNOSIS — I10 Essential (primary) hypertension: Secondary | ICD-10-CM

## 2018-01-25 DIAGNOSIS — N183 Chronic kidney disease, stage 3 unspecified: Secondary | ICD-10-CM

## 2018-01-25 DIAGNOSIS — E785 Hyperlipidemia, unspecified: Secondary | ICD-10-CM | POA: Diagnosis not present

## 2018-01-25 DIAGNOSIS — E611 Iron deficiency: Secondary | ICD-10-CM

## 2018-01-25 LAB — CBC WITH DIFFERENTIAL/PLATELET
Basophils Absolute: 0.1 10*3/uL (ref 0.0–0.1)
Basophils Relative: 0.8 % (ref 0.0–3.0)
Eosinophils Absolute: 0.6 10*3/uL (ref 0.0–0.7)
Eosinophils Relative: 6.4 % — ABNORMAL HIGH (ref 0.0–5.0)
HCT: 37.9 % — ABNORMAL LOW (ref 39.0–52.0)
Hemoglobin: 12.6 g/dL — ABNORMAL LOW (ref 13.0–17.0)
LYMPHS ABS: 2.1 10*3/uL (ref 0.7–4.0)
Lymphocytes Relative: 21.5 % (ref 12.0–46.0)
MCHC: 33.3 g/dL (ref 30.0–36.0)
MCV: 84.8 fl (ref 78.0–100.0)
MONOS PCT: 6.7 % (ref 3.0–12.0)
Monocytes Absolute: 0.6 10*3/uL (ref 0.1–1.0)
NEUTROS PCT: 64.6 % (ref 43.0–77.0)
Neutro Abs: 6.3 10*3/uL (ref 1.4–7.7)
PLATELETS: 264 10*3/uL (ref 150.0–400.0)
RBC: 4.47 Mil/uL (ref 4.22–5.81)
RDW: 13.5 % (ref 11.5–15.5)
WBC: 9.7 10*3/uL (ref 4.0–10.5)

## 2018-01-25 LAB — BASIC METABOLIC PANEL
BUN: 27 mg/dL — ABNORMAL HIGH (ref 6–23)
CALCIUM: 8.9 mg/dL (ref 8.4–10.5)
CO2: 22 mEq/L (ref 19–32)
CREATININE: 2.34 mg/dL — AB (ref 0.40–1.50)
Chloride: 109 mEq/L (ref 96–112)
GFR: 28.61 mL/min — AB (ref >60.00)
Glucose, Bld: 97 mg/dL (ref 70–99)
Potassium: 4.6 mEq/L (ref 3.5–5.1)
SODIUM: 139 meq/L (ref 135–145)

## 2018-01-25 LAB — HEPATIC FUNCTION PANEL
ALBUMIN: 3.7 g/dL (ref 3.5–5.2)
ALK PHOS: 60 U/L (ref 39–117)
ALT: 11 U/L (ref 0–53)
AST: 10 U/L (ref 0–37)
Bilirubin, Direct: 0 mg/dL (ref 0.0–0.3)
TOTAL PROTEIN: 6.5 g/dL (ref 6.0–8.3)
Total Bilirubin: 0.3 mg/dL (ref 0.2–1.2)

## 2018-01-25 LAB — TSH: TSH: 1.58 u[IU]/mL (ref 0.35–4.50)

## 2018-01-25 MED ORDER — AMLODIPINE BESYLATE 2.5 MG PO TABS: 2.5000 mg | ORAL_TABLET | Freq: Two times a day (BID) | ORAL | 11 refills | Status: DC

## 2018-01-25 NOTE — Assessment & Plan Note (Signed)
Labetalol 1  bid, Amlodipine 2.5 - start BID

## 2018-01-25 NOTE — Patient Instructions (Signed)

## 2018-01-25 NOTE — Progress Notes (Signed)
Subjective:  Patient ID: Kenneth Williams, male    DOB: 02-Dec-1935  Age: 82 y.o. MRN: [id number]  CC: No chief complaint on file.   HPI Kenneth Williams presents for BPH sx's - better, HTN, hypothyroidism f/u BP has been up  Outpatient Medications Prior to Visit  Medication Sig Dispense Refill  . acetaminophen (TYLENOL) 500 MG tablet Take 500 mg by mouth 2 (two) times daily as needed for mild pain.    Marland Kitchen amLODipine (NORVASC) 2.5 MG tablet TAKE 1 TABLET BY MOUTH DAILY. 30 tablet 11  . cholecalciferol (VITAMIN D) 1000 UNITS tablet Take 1,000 Units by mouth daily.     . clotrimazole-betamethasone (LOTRISONE) cream Apply 1 application topically 2 (two) times daily. 45 g 1  . colchicine 0.6 MG tablet TAKE 1 TABLET (0.6 MG TOTAL) BY MOUTH 3 (THREE) TIMES DAILY AS NEEDED. FOR GOUT 60 tablet 2  . Cyanocobalamin (VITAMIN B 12 PO) Take 1 tablet by mouth daily.      . fluticasone (FLONASE) 50 MCG/ACT nasal spray Place 2 sprays into both nostrils daily. 16 g 6  . labetalol (NORMODYNE) 300 MG tablet Take 1 tablet (300 mg total) by mouth 2 (two) times daily. 60 tablet 11  . levothyroxine (SYNTHROID, LEVOTHROID) 88 MCG tablet TAKE 1 TABLET (88 MCG TOTAL) BY MOUTH DAILY. 30 tablet 11  . Niacin (VITAMIN B-3 PO) Take 1 tablet by mouth daily.    . polyethylene glycol (MIRALAX / GLYCOLAX) packet Take 17 g by mouth daily as needed for mild constipation. 14 each 0  . pravastatin (PRAVACHOL) 20 MG tablet Take 1 tablet (20 mg total) by mouth daily. 90 tablet 3  . saccharomyces boulardii (FLORASTOR) 250 MG capsule Take 1 capsule (250 mg total) by mouth 2 (two) times daily. 60 capsule 0   No facility-administered medications prior to visit.     ROS: Review of Systems  Constitutional: Negative for appetite change, fatigue and unexpected weight change.  HENT: Negative for congestion, nosebleeds, sneezing, sore throat and trouble swallowing.   Eyes: Negative for itching and visual disturbance.  Respiratory:  Negative for cough.   Cardiovascular: Negative for chest pain, palpitations and leg swelling.  Gastrointestinal: Negative for abdominal distention, blood in stool, diarrhea and nausea.  Genitourinary: Positive for frequency. Negative for hematuria.  Musculoskeletal: Negative for back pain, gait problem, joint swelling and neck pain.  Skin: Negative for rash.  Neurological: Negative for dizziness, tremors, speech difficulty and weakness.  Psychiatric/Behavioral: Negative for agitation, dysphoric mood, sleep disturbance and suicidal ideas. The patient is not nervous/anxious.   hearing aid  Objective:  BP (!) 154/82 (BP Location: Left Arm, Patient Position: Sitting, Cuff Size: Normal)   Pulse 83   Temp 97.8 F (36.6 C) (Oral)   Ht 6\' 2"  (1.88 m)   Wt 190 lb (86.2 kg)   SpO2 98%   BMI 24.39 kg/m   BP Readings from Last 3 Encounters:  01/25/18 (!) 154/82  12/12/17 (!) 152/86  11/05/17 (!) 156/65    Wt Readings from Last 3 Encounters:  01/25/18 190 lb (86.2 kg)  12/12/17 189 lb (85.7 kg)  11/04/17 182 lb 6 oz (82.7 kg)    Physical Exam  Constitutional: He is oriented to person, place, and time. He appears well-developed. No distress.  NAD  HENT:  Mouth/Throat: Oropharynx is clear and moist.  Eyes: Pupils are equal, round, and reactive to light. Conjunctivae are normal.  Neck: Normal range of motion. No JVD present. No thyromegaly present.  Cardiovascular: Normal rate, regular rhythm, normal heart sounds and intact distal pulses. Exam reveals no gallop and no friction rub.  No murmur heard. Pulmonary/Chest: Effort normal and breath sounds normal. No respiratory distress. He has no wheezes. He has no rales. He exhibits no tenderness.  Abdominal: Soft. Bowel sounds are normal. He exhibits no distension and no mass. There is no tenderness. There is no rebound and no guarding.  Musculoskeletal: Normal range of motion. He exhibits no edema or tenderness.  Lymphadenopathy:    He has  no cervical adenopathy.  Neurological: He is alert and oriented to person, place, and time. He has normal reflexes. No cranial nerve deficit. He exhibits normal muscle tone. He displays a negative Romberg sign. Coordination and gait normal.  Skin: Skin is warm and dry. No rash noted.  Psychiatric: He has a normal mood and affect. His behavior is normal. Judgment and thought content normal.    Lab Results  Component Value Date   WBC 9.0 10/26/2017   HGB 10.9 (L) 10/31/2017   HCT 32.0 (L) 10/31/2017   PLT 285 10/26/2017   GLUCOSE 88 10/31/2017   CHOL 211 (H) 08/29/2017   TRIG 138.0 08/29/2017   HDL 37.30 (L) 08/29/2017   LDLDIRECT 123.0 03/25/2014   LDLCALC 146 (H) 08/29/2017   ALT 31 09/19/2017   AST 23 09/19/2017   NA 142 10/31/2017   K 4.0 10/31/2017   CL 109 10/26/2017   CREATININE 2.59 (H) 10/26/2017   BUN 40 (H) 10/26/2017   CO2 27 10/26/2017   TSH 1.69 08/29/2017   PSA 1.73 05/18/2016   INR 1.03 09/13/2017    No results found.  Assessment & Plan:   There are no diagnoses linked to this encounter.   No orders of the defined types were placed in this encounter.    Follow-up: No follow-ups on file.  Walker Kehr, MD

## 2018-01-25 NOTE — Assessment & Plan Note (Signed)
CT ca scoring option discussed

## 2018-01-26 ENCOUNTER — Other Ambulatory Visit: Payer: Self-pay | Admitting: Internal Medicine

## 2018-01-26 LAB — IRON,TIBC AND FERRITIN PANEL
%SAT: 11 % — AB (ref 20–48)
FERRITIN: 43 ng/mL (ref 24–380)
Iron: 32 ug/dL — ABNORMAL LOW (ref 50–180)
TIBC: 301 mcg/dL (calc) (ref 250–425)

## 2018-01-26 MED ORDER — FERROUS SULFATE 325 (65 FE) MG PO TABS: 325.0000 mg | ORAL_TABLET | Freq: Every day | ORAL | 6 refills | Status: DC

## 2018-03-02 ENCOUNTER — Ambulatory Visit (INDEPENDENT_AMBULATORY_CARE_PROVIDER_SITE_OTHER): Payer: PPO | Admitting: Internal Medicine

## 2018-03-02 ENCOUNTER — Encounter: Payer: Self-pay | Admitting: Internal Medicine

## 2018-03-02 VITALS — BP 178/80 | HR 60 | Temp 97.9°F | Ht 74.0 in | Wt 191.0 lb

## 2018-03-02 DIAGNOSIS — H01003 Unspecified blepharitis right eye, unspecified eyelid: Secondary | ICD-10-CM

## 2018-03-02 DIAGNOSIS — R600 Localized edema: Secondary | ICD-10-CM

## 2018-03-02 DIAGNOSIS — R634 Abnormal weight loss: Secondary | ICD-10-CM | POA: Diagnosis not present

## 2018-03-02 DIAGNOSIS — I10 Essential (primary) hypertension: Secondary | ICD-10-CM | POA: Diagnosis not present

## 2018-03-02 DIAGNOSIS — H01006 Unspecified blepharitis left eye, unspecified eyelid: Secondary | ICD-10-CM

## 2018-03-02 DIAGNOSIS — E785 Hyperlipidemia, unspecified: Secondary | ICD-10-CM

## 2018-03-02 HISTORY — DX: Unspecified blepharitis right eye, unspecified eyelid: H01.003

## 2018-03-02 HISTORY — DX: Unspecified blepharitis left eye, unspecified eyelid: H01.006

## 2018-03-02 MED ORDER — ERYTHROMYCIN 5 MG/GM OP OINT: 1.0000 "application " | TOPICAL_OINTMENT | Freq: Every day | OPHTHALMIC | 0 refills | Status: DC

## 2018-03-02 MED ORDER — LABETALOL HCL 300 MG PO TABS: 300.0000 mg | ORAL_TABLET | Freq: Three times a day (TID) | ORAL | 11 refills | Status: DC

## 2018-03-02 NOTE — Assessment & Plan Note (Addendum)
Uncontrolled Cont Amlodipine 2.5 bid  Labetalol 300 mg tid

## 2018-03-02 NOTE — Assessment & Plan Note (Signed)
No relapse 

## 2018-03-02 NOTE — Progress Notes (Signed)
Subjective:  Patient ID: Kenneth Williams, male    DOB: [birth year]  Age: 83 y.o. MRN: [id number]  CC: No chief complaint on file.   HPI Kenneth Williams presents for HTN: SBP 170-190 at home F/u BPH, dyslipidemia f/u  Outpatient Medications Prior to Visit  Medication Sig Dispense Refill  . acetaminophen (TYLENOL) 500 MG tablet Take 500 mg by mouth 2 (two) times daily as needed for mild pain.    Marland Kitchen amLODipine (NORVASC) 2.5 MG tablet Take 1 tablet (2.5 mg total) by mouth 2 (two) times daily. 60 tablet 11  . cholecalciferol (VITAMIN D) 1000 UNITS tablet Take 1,000 Units by mouth daily.     . clotrimazole-betamethasone (LOTRISONE) cream Apply 1 application topically 2 (two) times daily. 45 g 1  . colchicine 0.6 MG tablet TAKE 1 TABLET (0.6 MG TOTAL) BY MOUTH 3 (THREE) TIMES DAILY AS NEEDED. FOR GOUT 60 tablet 2  . Cyanocobalamin (VITAMIN B 12 PO) Take 1 tablet by mouth daily.      . ferrous sulfate 325 (65 FE) MG tablet Take 1 tablet (325 mg total) by mouth daily. 30 tablet 6  . fluticasone (FLONASE) 50 MCG/ACT nasal spray Place 2 sprays into both nostrils daily. 16 g 6  . labetalol (NORMODYNE) 300 MG tablet Take 1 tablet (300 mg total) by mouth 2 (two) times daily. 60 tablet 11  . levothyroxine (SYNTHROID, LEVOTHROID) 88 MCG tablet TAKE 1 TABLET (88 MCG TOTAL) BY MOUTH DAILY. 30 tablet 11  . Niacin (VITAMIN B-3 PO) Take 1 tablet by mouth daily.    . polyethylene glycol (MIRALAX / GLYCOLAX) packet Take 17 g by mouth daily as needed for mild constipation. 14 each 0  . pravastatin (PRAVACHOL) 20 MG tablet Take 1 tablet (20 mg total) by mouth daily. 90 tablet 3  . saccharomyces boulardii (FLORASTOR) 250 MG capsule Take 1 capsule (250 mg total) by mouth 2 (two) times daily. 60 capsule 0   No facility-administered medications prior to visit.     ROS: Review of Systems  Constitutional: Positive for fatigue. Negative for appetite change and unexpected weight change.  HENT: Negative for congestion,  nosebleeds, sneezing, sore throat and trouble swallowing.   Eyes: Negative for itching and visual disturbance.  Respiratory: Negative for cough.   Cardiovascular: Negative for chest pain, palpitations and leg swelling.  Gastrointestinal: Negative for abdominal distention, blood in stool, diarrhea and nausea.  Genitourinary: Positive for frequency and urgency. Negative for hematuria.  Musculoskeletal: Negative for back pain, gait problem, joint swelling and neck pain.  Skin: Negative for rash.  Neurological: Negative for dizziness, tremors, speech difficulty and weakness.  Psychiatric/Behavioral: Negative for agitation, dysphoric mood, sleep disturbance and suicidal ideas. The patient is not nervous/anxious.     Objective:  BP (!) 178/80 (BP Location: Left Arm, Patient Position: Sitting, Cuff Size: Normal)   Pulse 60   Temp 97.9 F (36.6 C) (Oral)   Ht 6\' 2"  (1.88 m)   Wt 191 lb (86.6 kg)   SpO2 98%   BMI 24.52 kg/m   BP Readings from Last 3 Encounters:  03/02/18 (!) 178/80  01/25/18 (!) 154/82  12/12/17 (!) 152/86    Wt Readings from Last 3 Encounters:  03/02/18 191 lb (86.6 kg)  01/25/18 190 lb (86.2 kg)  12/12/17 189 lb (85.7 kg)    Physical Exam Constitutional:      General: He is not in acute distress.    Appearance: He is well-developed.     Comments: NAD  Eyes:     Conjunctiva/sclera: Conjunctivae normal.     Pupils: Pupils are equal, round, and reactive to light.  Neck:     Musculoskeletal: Normal range of motion.     Thyroid: No thyromegaly.     Vascular: No JVD.  Cardiovascular:     Rate and Rhythm: Normal rate and regular rhythm.     Heart sounds: Normal heart sounds. No murmur. No friction rub. No gallop.   Pulmonary:     Effort: Pulmonary effort is normal. No respiratory distress.     Breath sounds: Normal breath sounds. No wheezing or rales.  Chest:     Chest wall: No tenderness.  Abdominal:     General: Bowel sounds are normal. There is no  distension.     Palpations: Abdomen is soft. There is no mass.     Tenderness: There is no abdominal tenderness. There is no guarding or rebound.  Musculoskeletal: Normal range of motion.        General: No tenderness.  Lymphadenopathy:     Cervical: No cervical adenopathy.  Skin:    General: Skin is warm and dry.     Findings: No rash.  Neurological:     Mental Status: He is alert and oriented to person, place, and time.     Cranial Nerves: No cranial nerve deficit.     Motor: No abnormal muscle tone.     Coordination: Coordination normal.     Gait: Gait normal.     Deep Tendon Reflexes: Reflexes are normal and symmetric.  Psychiatric:        Behavior: Behavior normal.        Thought Content: Thought content normal.        Judgment: Judgment normal.     Lab Results  Component Value Date   WBC 9.7 01/25/2018   HGB 12.6 (L) 01/25/2018   HCT 37.9 (L) 01/25/2018   PLT 264.0 01/25/2018   GLUCOSE 97 01/25/2018   CHOL 211 (H) 08/29/2017   TRIG 138.0 08/29/2017   HDL 37.30 (L) 08/29/2017   LDLDIRECT 123.0 03/25/2014   LDLCALC 146 (H) 08/29/2017   ALT 11 01/25/2018   AST 10 01/25/2018   NA 139 01/25/2018   K 4.6 01/25/2018   CL 109 01/25/2018   CREATININE 2.34 (H) 01/25/2018   BUN 27 (H) 01/25/2018   CO2 22 01/25/2018   TSH 1.58 01/25/2018   PSA 1.73 05/18/2016   INR 1.03 09/13/2017    No results found.  Assessment & Plan:   There are no diagnoses linked to this encounter.   No orders of the defined types were placed in this encounter.    Follow-up: No follow-ups on file.  Walker Kehr, MD

## 2018-03-02 NOTE — Assessment & Plan Note (Signed)
Wt Readings from Last 3 Encounters:  03/02/18 191 lb (86.6 kg)  01/25/18 190 lb (86.2 kg)  12/12/17 189 lb (85.7 kg)

## 2018-03-02 NOTE — Assessment & Plan Note (Signed)
eryth oint

## 2018-03-06 DIAGNOSIS — R300 Dysuria: Secondary | ICD-10-CM | POA: Diagnosis not present

## 2018-03-06 DIAGNOSIS — D649 Anemia, unspecified: Secondary | ICD-10-CM | POA: Diagnosis not present

## 2018-03-06 DIAGNOSIS — N184 Chronic kidney disease, stage 4 (severe): Secondary | ICD-10-CM | POA: Diagnosis not present

## 2018-03-06 DIAGNOSIS — I129 Hypertensive chronic kidney disease with stage 1 through stage 4 chronic kidney disease, or unspecified chronic kidney disease: Secondary | ICD-10-CM | POA: Diagnosis not present

## 2018-03-06 DIAGNOSIS — N400 Enlarged prostate without lower urinary tract symptoms: Secondary | ICD-10-CM | POA: Diagnosis not present

## 2018-03-17 ENCOUNTER — Other Ambulatory Visit: Payer: Self-pay | Admitting: Internal Medicine

## 2018-03-31 ENCOUNTER — Ambulatory Visit (INDEPENDENT_AMBULATORY_CARE_PROVIDER_SITE_OTHER)
Admission: RE | Admit: 2018-03-31 | Discharge: 2018-03-31 | Disposition: A | Discharge status: Home / Self Care | Payer: PPO | Source: Ambulatory Visit | Attending: Internal Medicine | Admitting: Internal Medicine

## 2018-03-31 DIAGNOSIS — E785 Hyperlipidemia, unspecified: Secondary | ICD-10-CM

## 2018-04-02 ENCOUNTER — Other Ambulatory Visit: Payer: Self-pay | Admitting: Internal Medicine

## 2018-04-02 DIAGNOSIS — I2510 Atherosclerotic heart disease of native coronary artery without angina pectoris: Secondary | ICD-10-CM

## 2018-04-02 DIAGNOSIS — I2583 Coronary atherosclerosis due to lipid rich plaque: Principal | ICD-10-CM

## 2018-04-03 ENCOUNTER — Ambulatory Visit (INDEPENDENT_AMBULATORY_CARE_PROVIDER_SITE_OTHER): Payer: PPO | Admitting: Internal Medicine

## 2018-04-03 ENCOUNTER — Encounter: Payer: Self-pay | Admitting: Internal Medicine

## 2018-04-03 DIAGNOSIS — I2583 Coronary atherosclerosis due to lipid rich plaque: Secondary | ICD-10-CM

## 2018-04-03 DIAGNOSIS — N183 Chronic kidney disease, stage 3 unspecified: Secondary | ICD-10-CM

## 2018-04-03 DIAGNOSIS — I2510 Atherosclerotic heart disease of native coronary artery without angina pectoris: Secondary | ICD-10-CM

## 2018-04-03 HISTORY — DX: Atherosclerotic heart disease of native coronary artery without angina pectoris: I25.10

## 2018-04-03 MED ORDER — ASPIRIN EC 81 MG PO TBEC: 81.0000 mg | DELAYED_RELEASE_TABLET | Freq: Every day | ORAL | 3 refills | Status: AC

## 2018-04-03 NOTE — Patient Instructions (Addendum)
Your coronary calcium score is 727. It is consistent with a extensive degree of coronary atherosclerosis. Your current management with pravastatin is appropriate. Your other chest organs look normal. I would like to refer you to see heart specialist to help [place] with further management. They will get in touch with you.     You can try a weighted blanket 16-20 lbs

## 2018-04-03 NOTE — Assessment & Plan Note (Signed)
Labs

## 2018-04-03 NOTE — Assessment & Plan Note (Addendum)
coronary calcium score is 727. It is consistent with a extensive degree of coronary atherosclerosis. No CP Pravastatin, ASA Cardiol ref

## 2018-04-03 NOTE — Progress Notes (Signed)
Subjective:  Patient ID: Kenneth Williams, male    DOB: February 23, 1935  Age: 83 y.o. MRN: [id number]  CC: No chief complaint on file.   HPI Kenneth Williams presents for HTN, CAD, dyslipidemia. BP is better C/o pain in B ankles/feet  Outpatient Medications Prior to Visit  Medication Sig Dispense Refill  . acetaminophen (TYLENOL) 500 MG tablet Take 500 mg by mouth 2 (two) times daily as needed for mild pain.    Marland Kitchen amLODipine (NORVASC) 2.5 MG tablet Take 1 tablet (2.5 mg total) by mouth 2 (two) times daily. 60 tablet 11  . cholecalciferol (VITAMIN D) 1000 UNITS tablet Take 1,000 Units by mouth daily.     . clotrimazole-betamethasone (LOTRISONE) cream Apply 1 application topically 2 (two) times daily. 45 g 1  . colchicine 0.6 MG tablet TAKE 1 TABLET (0.6 MG TOTAL) BY MOUTH 3 (THREE) TIMES DAILY AS NEEDED. FOR GOUT 60 tablet 2  . Cyanocobalamin (VITAMIN B 12 PO) Take 1 tablet by mouth daily.      . ferrous sulfate 325 (65 FE) MG tablet Take 1 tablet (325 mg total) by mouth daily. 30 tablet 6  . fluticasone (FLONASE) 50 MCG/ACT nasal spray Place 2 sprays into both nostrils daily. 16 g 6  . labetalol (NORMODYNE) 300 MG tablet Take 1 tablet (300 mg total) by mouth 3 (three) times daily. 90 tablet 11  . levothyroxine (SYNTHROID, LEVOTHROID) 88 MCG tablet TAKE 1 TABLET (88 MCG TOTAL) BY MOUTH DAILY. 90 tablet 3  . Niacin (VITAMIN B-3 PO) Take 1 tablet by mouth daily.    . polyethylene glycol (MIRALAX / GLYCOLAX) packet Take 17 g by mouth daily as needed for mild constipation. 14 each 0  . pravastatin (PRAVACHOL) 20 MG tablet Take 1 tablet (20 mg total) by mouth daily. 90 tablet 3  . saccharomyces boulardii (FLORASTOR) 250 MG capsule Take 1 capsule (250 mg total) by mouth 2 (two) times daily. 60 capsule 0   No facility-administered medications prior to visit.     ROS: Review of Systems  Constitutional: Positive for fatigue. Negative for appetite change and unexpected weight change.  HENT: Positive  for hearing loss. Negative for congestion, nosebleeds, sneezing, sore throat and trouble swallowing.   Eyes: Negative for itching and visual disturbance.  Respiratory: Negative for cough.   Cardiovascular: Negative for chest pain, palpitations and leg swelling.  Gastrointestinal: Negative for abdominal distention, blood in stool, diarrhea and nausea.  Genitourinary: Positive for dysuria and frequency. Negative for difficulty urinating and hematuria.  Musculoskeletal: Positive for arthralgias and back pain. Negative for gait problem, joint swelling and neck pain.  Skin: Negative for rash.  Neurological: Negative for dizziness, tremors, speech difficulty and weakness.  Psychiatric/Behavioral: Positive for decreased concentration. Negative for agitation, dysphoric mood and sleep disturbance. The patient is not nervous/anxious.   apathy  Objective:  BP (!) 146/82 (BP Location: Left Arm, Patient Position: Sitting, Cuff Size: Normal)   Pulse 70   Temp 98 F (36.7 C) (Oral)   Ht 6\' 2"  (1.88 m)   Wt 190 lb (86.2 kg)   SpO2 98%   BMI 24.39 kg/m   BP Readings from Last 3 Encounters:  04/03/18 (!) 146/82  03/02/18 (!) 178/80  01/25/18 (!) 154/82    Wt Readings from Last 3 Encounters:  04/03/18 190 lb (86.2 kg)  03/02/18 191 lb (86.6 kg)  01/25/18 190 lb (86.2 kg)    Physical Exam Constitutional:      General: He is not in  acute distress.    Appearance: He is well-developed.     Comments: NAD  Eyes:     Conjunctiva/sclera: Conjunctivae normal.     Pupils: Pupils are equal, round, and reactive to light.  Neck:     Musculoskeletal: Normal range of motion.     Thyroid: No thyromegaly.     Vascular: No JVD.  Cardiovascular:     Rate and Rhythm: Normal rate and regular rhythm.     Heart sounds: Normal heart sounds. No murmur. No friction rub. No gallop.   Pulmonary:     Effort: Pulmonary effort is normal. No respiratory distress.     Breath sounds: Normal breath sounds. No wheezing  or rales.  Chest:     Chest wall: No tenderness.  Abdominal:     General: Bowel sounds are normal. There is no distension.     Palpations: Abdomen is soft. There is no mass.     Tenderness: There is no abdominal tenderness. There is no guarding or rebound.  Musculoskeletal: Normal range of motion.        General: No tenderness.  Lymphadenopathy:     Cervical: No cervical adenopathy.  Skin:    General: Skin is warm and dry.     Findings: No rash.  Neurological:     Mental Status: He is alert and oriented to person, place, and time.     Cranial Nerves: No cranial nerve deficit.     Motor: No abnormal muscle tone.     Coordination: Coordination normal.     Gait: Gait normal.     Deep Tendon Reflexes: Reflexes are normal and symmetric.  Psychiatric:        Behavior: Behavior normal.        Thought Content: Thought content normal.        Judgment: Judgment normal.     Lab Results  Component Value Date   WBC 9.7 01/25/2018   HGB 12.6 (L) 01/25/2018   HCT 37.9 (L) 01/25/2018   PLT 264.0 01/25/2018   GLUCOSE 97 01/25/2018   CHOL 211 (H) 08/29/2017   TRIG 138.0 08/29/2017   HDL 37.30 (L) 08/29/2017   LDLDIRECT 123.0 03/25/2014   LDLCALC 146 (H) 08/29/2017   ALT 11 01/25/2018   AST 10 01/25/2018   NA 139 01/25/2018   K 4.6 01/25/2018   CL 109 01/25/2018   CREATININE 2.34 (H) 01/25/2018   BUN 27 (H) 01/25/2018   CO2 22 01/25/2018   TSH 1.58 01/25/2018   PSA 1.73 05/18/2016   INR 1.03 09/13/2017    Ct Cardiac Scoring  Addendum Date: 03/31/2018   ADDENDUM REPORT: 03/31/2018 13:22 EXAM: OVER-READ INTERPRETATION  CT CHEST The following report is an over-read performed by radiologist Dr. Norlene Duel [facility] on 03/31/2018. This over-read does not include interpretation of cardiac or coronary anatomy or pathology. The coronary calcium score interpretation by the cardiologist is attached. COMPARISON:  None. FINDINGS: The visualized portions of the lower airway  and esophagus are unremarkable. No mass or adenopathy identified. Small left pleural effusion versus thickening identified. Scarring is identified within the posterior left lower lobe, unchanged. No airspace consolidation, atelectasis or pneumothorax identified. No acute abnormality within the visualized portions of the abdomen. Spondylosis identified within the lower thoracic spine. IMPRESSION: 1. No acute cardiopulmonary abnormalities identified. 2. Left lower lobe scarring and chronic pleural thickening or tiny left pleural effusion. Unchanged. Electronically Signed   By: Kerby Moors M.D.   On: 03/31/2018 13:22   Result  Date: 03/31/2018 CLINICAL DATA:  Risk stratification EXAM: Coronary Calcium Score MEDICATIONS: None TECHNIQUE: The patient was scanned on a [employer]. Axial non-contrast 3 mm slices were carried out through the heart. The data set was analyzed on a dedicated work station and scored using the [employer]. FINDINGS: Non-cardiac: See separate report from [facility]. Ascending Aorta: Normal size, mild diffuse calcifications in the aortic root. Pericardium: Normal. Coronary arteries: Normal origin. IMPRESSION: Coronary calcium score of 727. This was 46 percentile for age and sex matched control. Electronically Signed: By: Ena Dawley On: 03/31/2018 11:34    Assessment & Plan:   There are no diagnoses linked to this encounter.   No orders of the defined types were placed in this encounter.    Follow-up: No follow-ups on file.  Walker Kehr, MD

## 2018-04-14 ENCOUNTER — Encounter: Payer: Self-pay | Admitting: Cardiology

## 2018-05-01 ENCOUNTER — Ambulatory Visit: Payer: PPO | Admitting: Internal Medicine

## 2018-05-03 ENCOUNTER — Encounter: Payer: Self-pay | Admitting: Cardiology

## 2018-05-03 ENCOUNTER — Other Ambulatory Visit: Payer: Self-pay

## 2018-05-03 ENCOUNTER — Ambulatory Visit (INDEPENDENT_AMBULATORY_CARE_PROVIDER_SITE_OTHER): Payer: PPO | Admitting: Cardiology

## 2018-05-03 VITALS — BP 152/80 | HR 65 | Ht 74.0 in | Wt 192.1 lb

## 2018-05-03 DIAGNOSIS — R0602 Shortness of breath: Secondary | ICD-10-CM

## 2018-05-03 DIAGNOSIS — E785 Hyperlipidemia, unspecified: Secondary | ICD-10-CM

## 2018-05-03 DIAGNOSIS — I2510 Atherosclerotic heart disease of native coronary artery without angina pectoris: Secondary | ICD-10-CM

## 2018-05-03 DIAGNOSIS — I10 Essential (primary) hypertension: Secondary | ICD-10-CM | POA: Diagnosis not present

## 2018-05-03 DIAGNOSIS — R0609 Other forms of dyspnea: Secondary | ICD-10-CM | POA: Diagnosis not present

## 2018-05-03 DIAGNOSIS — N3000 Acute cystitis without hematuria: Secondary | ICD-10-CM | POA: Diagnosis not present

## 2018-05-03 DIAGNOSIS — R300 Dysuria: Secondary | ICD-10-CM | POA: Diagnosis not present

## 2018-05-03 MED ORDER — AMLODIPINE BESYLATE 5 MG PO TABS: 5.0000 mg | ORAL_TABLET | Freq: Every day | ORAL | 3 refills | Status: AC

## 2018-05-03 NOTE — Progress Notes (Signed)
Cardiology Office Note    Date:  05/03/2018   ID:  Kenneth Williams, DOB [birth year], MRN [id number]  PCP:  Cassandria Anger, MD  Cardiologist:  Fransico Him, MD   Chief Complaint  Patient presents with   New Patient (Initial Visit)    High coronary calcium score    History of Present Illness:  Kenneth Williams is a 83 y.o. male who is being seen today for the evaluation of elevated coronary artery calcium score at the request of Kenneth, Evie Lacks, MD.  This is a very pleasant 83 year old male with a history of CAD that was mild [place] nonobstructive by cath in 2011, dyspnea on exertion, hyperlipidemia, hypertension who recently underwent calcium score due to hyperlipidemia for risk assessment.  Chest CT revealed an elevated calcium score of 727 which was 63 percentile for age and sex matched controls.  There was mild diffuse calcifications in the aortic root.  Now here for further evaluation.  He is here today for further discussion of his results..  He denies any chest pain or pressure, PND, orthopnea, LE edema, dizziness, palpitations or syncope.  He is, though, having significant problems with dyspnea on exertion and exertional fatigue.  He says he does not have enough energy to get out and do the list of things he has made that he needs to do.  He gets worn out very easily.  He gets very short of breath even just walking to the mailbox.  He denies any lower extremity edema or volume overload.  Does have a family history of heart disease although later in life but has not smoked.  He is compliant with his meds and is tolerating meds with no SE.    Past Medical History:  Diagnosis Date   BPH (benign prostatic hyperplasia)    CAD (coronary artery disease)    cath 2011-mild non obstructive, coronary artery calcium score of 727 on CT 04/13/2018   Dyspnea    with activity   Elevated PSA    history of    Gout    2009   History of diverticulitis of colon    History of  pancreatitis    History of TIAs    Hyperlipidemia    Hypertension    Left ear hearing loss    wears right hearing aid   without hearing aides deaf   Personal history of hyperthyroidism 2006   s/p 131I- Dr. Chalmers Cater, Hyperthyroid   Renal insufficiency 2011   TGA (transient global amnesia)     Past Surgical History:  Procedure Laterality Date   CHOLECYSTECTOMY     COLONOSCOPY     IR FLUORO GUIDE CV LINE RIGHT  09/14/2017   IR REMOVAL TUN CV CATH W/O FL  09/20/2017   IR US GUIDE VASC ACCESS RIGHT  09/14/2017   right ear  surgery     BAHA bone anchored  with screw   STAPEDES SURGERY     LEFT EAR   TRANSURETHRAL RESECTION OF PROSTATE     TRANSURETHRAL RESECTION OF PROSTATE N/A 11/04/2017   Procedure: TRANSURETHRAL RESECTION OF THE PROSTATE (TURP);  Surgeon: Franchot Gallo, MD;  Location: [facility];  Service: Urology;  Laterality: N/A;  1 HR    Current Medications: Current Meds  Medication Sig   acetaminophen (TYLENOL) 500 MG tablet Take 500 mg by mouth 2 (two) times daily as needed for mild pain.   amLODipine (NORVASC) 2.5 MG tablet Take 1 tablet (2.5 mg total) by mouth 2 (two)  times daily.   aspirin EC 81 MG tablet Take 1 tablet (81 mg total) by mouth daily.   cholecalciferol (VITAMIN D) 1000 UNITS tablet Take 1,000 Units by mouth daily.    clotrimazole-betamethasone (LOTRISONE) cream Apply 1 application topically 2 (two) times daily.   colchicine 0.6 MG tablet TAKE 1 TABLET (0.6 MG TOTAL) BY MOUTH 3 (THREE) TIMES DAILY AS NEEDED. FOR GOUT   Cyanocobalamin (VITAMIN B 12 PO) Take 1 tablet by mouth daily.     ferrous sulfate 325 (65 FE) MG tablet Take 1 tablet (325 mg total) by mouth daily.   fluticasone (FLONASE) 50 MCG/ACT nasal spray Place 2 sprays into both nostrils daily.   labetalol (NORMODYNE) 300 MG tablet Take 1 tablet (300 mg total) by mouth 3 (three) times daily.   levothyroxine (SYNTHROID, LEVOTHROID) 88 MCG tablet TAKE 1 TABLET (88 MCG TOTAL) BY  MOUTH DAILY.   Niacin (VITAMIN B-3 PO) Take 1 tablet by mouth daily.   polyethylene glycol (MIRALAX / GLYCOLAX) packet Take 17 g by mouth daily as needed for mild constipation.   pravastatin (PRAVACHOL) 20 MG tablet Take 1 tablet (20 mg total) by mouth daily.   saccharomyces boulardii (FLORASTOR) 250 MG capsule Take 1 capsule (250 mg total) by mouth 2 (two) times daily.    Allergies:   Lipitor [atorvastatin]; Other; Spironolactone; Zocor [simvastatin]; Coreg [carvedilol]; Donepezil hydrochloride; Hydrochlorothiazide; and Razadyne [galantamine hydrobromide]   Social History   Socioeconomic History   Marital status: Widowed    Spouse name: Not on file   Number of children: 3   Years of education: Not on file   Highest education level: Not on file  Occupational History   Not on file  Social Needs   Financial resource strain: Not on file   Food insecurity:    Worry: Not on file    Inability: Not on file   Transportation needs:    Medical: Not on file    Non-medical: Not on file  Tobacco Use   Smoking status: Never Smoker   Smokeless tobacco: Never Used  Substance and Sexual Activity   Alcohol use: No   Drug use: No   Sexual activity: Not Currently  Lifestyle   Physical activity:    Days per week: Not on file    Minutes per session: Not on file   Stress: Not on file  Relationships   Social connections:    Talks on phone: Not on file    Gets together: Not on file    Attends religious service: Not on file    Active member of club or organization: Not on file    Attends meetings of clubs or organizations: Not on file    Relationship status: Not on file  Other Topics Concern   Not on file  Social History Narrative   Patient is right handed.   Patient drinks 1-2 cups caffeine daily.     Family History:  The patient's family history includes Dementia in his mother; Heart disease in his sister; Hypertension in an other family member.   ROS:   Please  see the history of present illness.    ROS All other systems reviewed and are negative.  No flowsheet data found.     PHYSICAL EXAM:   VS:  BP (!) 152/80    Pulse 65    Ht 6\' 2"  (1.88 m)    Wt 192 lb 1.9 oz (87.1 kg)    SpO2 97%    BMI 24.67 kg/m  GEN: Well nourished, well developed, in no acute distress  HEENT: normal  Neck: no JVD, carotid bruits, or masses Cardiac: RRR; no murmurs, rubs, or gallops,no edema.  Intact distal pulses bilaterally.  Respiratory:  clear to auscultation bilaterally, normal work of breathing GI: soft, nontender, nondistended, + BS MS: no deformity or atrophy  Skin: warm and dry, no rash Neuro:  Alert and Oriented x 3, Strength and sensation are intact Psych: euthymic mood, full affect  Wt Readings from Last 3 Encounters:  05/03/18 192 lb 1.9 oz (87.1 kg)  04/03/18 190 lb (86.2 kg)  03/02/18 191 lb (86.6 kg)      Studies/Labs Reviewed:   EKG:  EKG is  ordered today.  The ekg ordered today demonstrates normal sinus rhythm at 60 bpm with no ST changes.  He does have Q waves in leads III and aVF which are old from prior EKG.  Recent Labs: 01/25/2018: ALT 11; BUN 27; Creatinine, Ser 2.34; Hemoglobin 12.6; Platelets 264.0; Potassium 4.6; Sodium 139; TSH 1.58   Lipid Panel    Component Value Date/Time   CHOL 211 (H) 08/29/2017 1605   TRIG 138.0 08/29/2017 1605   HDL 37.30 (L) 08/29/2017 1605   CHOLHDL 6 08/29/2017 1605   VLDL 27.6 08/29/2017 1605   LDLCALC 146 (H) 08/29/2017 1605   LDLDIRECT 123.0 03/25/2014 1112    Additional studies/ records that were reviewed today include:  Office notes from PCP and Chest CT    ASSESSMENT:    1. Coronary artery disease involving native coronary artery of native heart without angina pectoris   2. Essential hypertension   3. Bilateral carotid artery stenosis   4. Dyslipidemia      PLAN:  In order of problems listed above:  1.  ASCAD - patient has a history of mild nonobstructive CAD by cath in  2011.  Recent chest CT for calcium score showed an elevated calcium score at 727 which is 63rd percentile for age match and sex matched controls.  He denies any chest pain having significant dyspnea on exertion that limits his ability to get out and work in the yard and do other activities that require exertion.  Would recommend aggressive risk factor modification with a goal LDL of less than 70 and adequate blood pressure control.  2.  Dyspnea on exertion -he has very significant dyspnea on exertion that limits him to be able to get out and do the activities he wants to do.  He is also having exertional fatigue to the point he does not want to get up and do anything.  I have recommended getting a 2D echocardiogram to assess LV and diastolic function and also a Lexiscan Myoview to rule out ischemia.  If he has no ischemia on Myoview and echo is normal then would recommend referral to pulmonology.   3.  Hypertension - BP is borderline controlled on exam today.  I have recommended that he change amlodipine to 5mg  BID instead of 2.5mg  BID.    4.  Hyperlipidemia - due to high calcium score on coronary CT would recommend LDL goal less than 70.  His LDL was 146 on 08/29/2017.  I will repeat an FLP and ALT to reassess this.  He will continue on pravastatin for now.    Medication Adjustments/Labs and Tests Ordered: Current medicines are reviewed at length with the patient today.  Concerns regarding medicines are outlined above.  Medication changes, Labs and Tests ordered today are listed in the Patient Instructions below.  There are no Patient Instructions on file for this visit.   Signed, Fransico Him, MD  05/03/2018 11:30 AM    [facility] [place], [place],   [address] Phone: [phone]; Fax: [phone]

## 2018-05-03 NOTE — Patient Instructions (Addendum)
Medication Instructions:  Change: Amlodipine to 5 mg, daily, by mouth  If you need a refill on your cardiac medications before your next appointment, please call your pharmacy.   Lab work: Future: LFT and Lipids, schedule same day as stress test.   If you have labs (blood work) drawn today and your tests are completely normal, you will receive your results only by: Marland Kitchen MyChart Message (if you have MyChart) OR . A paper copy in the mail If you have any lab test that is abnormal or we need to change your treatment, we will call you to review the results.  Testing/Procedures: Your physician has requested that you have an echocardiogram. Echocardiography is a painless test that uses sound waves to create images of your heart. It provides your doctor with information about the size and shape of your heart and how well your heart's chambers and valves are working. This procedure takes approximately one hour. There are no restrictions for this procedure.  Your physician has requested that you have a lexiscan myoview. For further information please visit [email or web address]. Please follow instruction sheet, as given.  Follow-Up: At [facility], you and your health needs are our priority.  As part of our continuing mission to provide you with exceptional heart care, we have created designated Provider Care Teams.  These Care Teams include your primary Cardiologist (physician) and Advanced Practice Providers (APPs -  Physician Assistants and Nurse Practitioners) who all work together to provide you with the care you need, when you need it. You will need a follow up appointment in 1 years.  Please call our office 2 months in advance to schedule this appointment.  You may see Dr. [name] or one of the following Advanced Practice Providers on your designated Care Team:   [place], PA-C Melina Copa, PA-C . Ermalinda Barrios, PA-C

## 2018-05-09 ENCOUNTER — Other Ambulatory Visit: Payer: PPO

## 2018-05-09 ENCOUNTER — Other Ambulatory Visit (HOSPITAL_COMMUNITY): Payer: Self-pay | Admitting: Urology

## 2018-05-09 DIAGNOSIS — N3000 Acute cystitis without hematuria: Secondary | ICD-10-CM

## 2018-05-09 NOTE — Progress Notes (Deleted)
Subjective:   Kenneth Williams is a 83 y.o. male who presents for Medicare Annual/Subsequent preventive examination.  Review of Systems:  No ROS.  Medicare Wellness Visit. Additional risk factors are reflected in the social history.    Sleep patterns: {SX; SLEEP PATTERNS:18802::"feels rested on waking","does not get up to void","gets up *** times nightly to void","sleeps *** hours nightly"}.    Home Safety/Smoke Alarms: Feels safe in home. Smoke alarms in place.  Living environment; residence and Firearm Safety: {Rehab home environment / accessibility:30080::"no firearms","firearms stored safely"}. Seat Belt Safety/Bike Helmet: Wears seat belt.   PSA-  Lab Results  Component Value Date   PSA 1.73 05/18/2016   PSA 2.15 03/25/2014   PSA 1.17 03/16/2013       Objective:    Vitals: There were no vitals taken for this visit.  There is no height or weight on file to calculate BMI.  Advanced Directives 11/04/2017 11/04/2017 10/26/2017 09/06/2017 06/07/2017 09/23/2016 03/28/2014  Does Patient Have a Medical Advance Directive? Yes Yes Yes Yes Yes No Yes  Type of Paramedic of [place];Living will [place];Living will [place];Living will [place];Living will [place];Living will - -  Does patient want to make changes to medical advance directive? No - Patient declined No - Patient declined No - Patient declined No - Patient declined - Yes (ED - Information included in AVS) -  Copy of [place] in Chart? No - copy requested No - copy requested No - copy requested No - copy requested - - -    Tobacco Social History   Tobacco Use  Smoking Status Never Smoker  Smokeless Tobacco Never Used     Counseling given: Not Answered  Past Medical History:  Diagnosis Date  . BPH (benign prostatic hyperplasia)   . CAD (coronary artery disease)    cath 2011-mild non obstructive,  coronary artery calcium score of 727 on CT 04/13/2018  . Dyspnea    with activity  . Elevated PSA    history of   . Gout    2009  . History of diverticulitis of colon   . History of pancreatitis   . History of TIAs   . Hyperlipidemia   . Hypertension   . Left ear hearing loss    wears right hearing aid   without hearing aides deaf  . Personal history of hyperthyroidism 2006   s/p 131I- Dr. Chalmers Cater, Hyperthyroid  . Renal insufficiency 2011  . TGA (transient global amnesia)    Past Surgical History:  Procedure Laterality Date  . CHOLECYSTECTOMY    . COLONOSCOPY    . IR FLUORO GUIDE CV LINE RIGHT  09/14/2017  . IR REMOVAL TUN CV CATH W/O FL  09/20/2017  . IR US GUIDE VASC ACCESS RIGHT  09/14/2017  . right ear  surgery     BAHA bone anchored  with screw  . STAPEDES SURGERY     LEFT EAR  . TRANSURETHRAL RESECTION OF PROSTATE    . TRANSURETHRAL RESECTION OF PROSTATE N/A 11/04/2017   Procedure: TRANSURETHRAL RESECTION OF THE PROSTATE (TURP);  Surgeon: Franchot Gallo, MD;  Location: [facility];  Service: Urology;  Laterality: N/A;  1 HR   Family History  Problem Relation Age of Onset  . Heart disease Sister   . Dementia Mother   . Hypertension Other   . Colon cancer Neg Hx   . Stomach cancer Neg Hx   .  Esophageal cancer Neg Hx   . Rectal cancer Neg Hx    Social History   Socioeconomic History  . Marital status: Widowed    Spouse name: Not on file  . Number of children: 3  . Years of education: Not on file  . Highest education level: Not on file  Occupational History  . Not on file  Social Needs  . Financial resource strain: Not on file  . Food insecurity:    Worry: Not on file    Inability: Not on file  . Transportation needs:    Medical: Not on file    Non-medical: Not on file  Tobacco Use  . Smoking status: Never Smoker  . Smokeless tobacco: Never Used  Substance and Sexual Activity  . Alcohol use: No  . Drug use: No  . Sexual activity: Not Currently  Lifestyle   . Physical activity:    Days per week: Not on file    Minutes per session: Not on file  . Stress: Not on file  Relationships  . Social connections:    Talks on phone: Not on file    Gets together: Not on file    Attends religious service: Not on file    Active member of club or organization: Not on file    Attends meetings of clubs or organizations: Not on file    Relationship status: Not on file  Other Topics Concern  . Not on file  Social History Narrative   Patient is right handed.   Patient drinks 1-2 cups caffeine daily.    Outpatient Encounter Medications as of 05/10/2018  Medication Sig  . acetaminophen (TYLENOL) 500 MG tablet Take 500 mg by mouth 2 (two) times daily as needed for mild pain.  Marland Kitchen amLODipine (NORVASC) 5 MG tablet Take 1 tablet (5 mg total) by mouth daily.  Marland Kitchen aspirin EC 81 MG tablet Take 1 tablet (81 mg total) by mouth daily.  . cholecalciferol (VITAMIN D) 1000 UNITS tablet Take 1,000 Units by mouth daily.   . clotrimazole-betamethasone (LOTRISONE) cream Apply 1 application topically 2 (two) times daily.  . colchicine 0.6 MG tablet TAKE 1 TABLET (0.6 MG TOTAL) BY MOUTH 3 (THREE) TIMES DAILY AS NEEDED. FOR GOUT  . Cyanocobalamin (VITAMIN B 12 PO) Take 1 tablet by mouth daily.    . ferrous sulfate 325 (65 FE) MG tablet Take 1 tablet (325 mg total) by mouth daily.  . fluticasone (FLONASE) 50 MCG/ACT nasal spray Place 2 sprays into both nostrils daily.  Marland Kitchen labetalol (NORMODYNE) 300 MG tablet Take 1 tablet (300 mg total) by mouth 3 (three) times daily.  Marland Kitchen levothyroxine (SYNTHROID, LEVOTHROID) 88 MCG tablet TAKE 1 TABLET (88 MCG TOTAL) BY MOUTH DAILY.  Marland Kitchen Niacin (VITAMIN B-3 PO) Take 1 tablet by mouth daily.  . polyethylene glycol (MIRALAX / GLYCOLAX) packet Take 17 g by mouth daily as needed for mild constipation.  . pravastatin (PRAVACHOL) 20 MG tablet Take 1 tablet (20 mg total) by mouth daily.  Marland Kitchen saccharomyces boulardii (FLORASTOR) 250 MG capsule Take 1 capsule  (250 mg total) by mouth 2 (two) times daily.   No facility-administered encounter medications on file as of 05/10/2018.     Activities of Daily Living In your present state of health, do you have any difficulty performing the following activities: 11/04/2017 11/04/2017  Hearing? Y Y  Comment - -  Vision? N N  Difficulty concentrating or making decisions? Tempie Donning  Walking or climbing stairs? Tempie Donning  Dressing  or bathing? N N  Doing errands, shopping? N -  Some recent data might be hidden    Patient Care Team: Plotnikov, Evie Lacks, MD as PCP - General Deboraha Sprang, MD (Cardiology) Ladene Artist, MD (Gastroenterology) Kathrynn Ducking, MD as Consulting Physician (Neurology) Vicie Mutters, MD as Consulting Physician (Otolaryngology) Justin Mend, MD as Attending Physician (Internal Medicine)   Assessment:   This is a routine wellness examination for [employer]. Physical assessment deferred to PCP.  Exercise Activities and Dietary recommendations   Diet (meal preparation, eat out, water intake, caffeinated beverages, dairy products, fruits and vegetables): {Desc; diets:16563}     Goals    . Improve my nutrition,  increase my water intake,  and drink supplements like ensure or boost.       Fall Risk Fall Risk  12/12/2017 09/23/2016 12/22/2015 04/15/2014  Falls in the past year? No No No No    Depression Screen PHQ 2/9 Scores 12/12/2017 09/23/2016 12/22/2015  PHQ - 2 Score - 2 4  PHQ- 9 Score - 11 8  Exception Documentation Patient refusal - -    Cognitive Function MMSE - Mini Mental State Exam 09/23/2016  Orientation to time 5  Orientation to Place 5  Registration 3  Attention/ Calculation 4  Recall 1  Language- name 2 objects 2  Language- repeat 1  Language- follow 3 step command 3  Language- read & follow direction 1  Write a sentence 1  Copy design 1  Total score 27   Montreal Cognitive Assessment  03/28/2014  Visuospatial/ Executive (0/5) 5  Naming (0/3) 3   Attention: Read list of digits (0/2) 2  Attention: Read list of letters (0/1) 1  Attention: Serial 7 subtraction starting at 100 (0/3) 3  Language: Repeat phrase (0/2) 2  Language : Fluency (0/1) 0  Abstraction (0/2) 2  Delayed Recall (0/5) 5  Orientation (0/6) 6  Total 29      Immunization History  Administered Date(s) Administered  . Influenza Split 02/12/2011, 11/23/2011  . Influenza Whole 11/16/2007, 02/04/2009  . Influenza, High Dose Seasonal PF 12/05/2012, 12/22/2015, 12/02/2016, 12/12/2017  . Influenza,inj,Quad PF,6+ Mos 01/01/2014, 10/18/2014  . Pneumococcal Conjugate-13 06/07/2013  . Pneumococcal Polysaccharide-23 12/22/2015  . Tdap 07/25/2010   Screening Tests Health Maintenance  Topic Date Due  . TETANUS/TDAP  07/24/2020  . INFLUENZA VACCINE  Completed  . PNA vac Low Risk Adult  Completed      Plan:    I have personally reviewed and noted the following in the patient's chart:   . Medical and social history . Use of alcohol, tobacco or illicit drugs  . Current medications and supplements . Functional ability and status . Nutritional status . Physical activity . Advanced directives . List of other physicians . Hospitalizations, surgeries, and ER visits in previous 12 months . Vitals . Screenings to include cognitive, depression, and falls . Referrals and appointments  In addition, I have reviewed and discussed with patient certain preventive protocols, quality metrics, and best practice recommendations. A written personalized care plan for preventive services as well as general preventive health recommendations were provided to patient.     Michiel Cowboy, RN  05/09/2018

## 2018-05-10 ENCOUNTER — Other Ambulatory Visit (HOSPITAL_COMMUNITY): Payer: Self-pay | Admitting: Urology

## 2018-05-10 ENCOUNTER — Other Ambulatory Visit: Payer: Self-pay

## 2018-05-10 ENCOUNTER — Ambulatory Visit (HOSPITAL_COMMUNITY)
Admission: RE | Admit: 2018-05-10 | Discharge: 2018-05-10 | Disposition: A | Discharge status: Home / Self Care | Payer: PPO | Source: Ambulatory Visit | Attending: Urology | Admitting: Urology

## 2018-05-10 ENCOUNTER — Ambulatory Visit: Payer: PPO

## 2018-05-10 DIAGNOSIS — N3000 Acute cystitis without hematuria: Secondary | ICD-10-CM

## 2018-05-10 DIAGNOSIS — N390 Urinary tract infection, site not specified: Secondary | ICD-10-CM | POA: Diagnosis not present

## 2018-05-10 DIAGNOSIS — Z87440 Personal history of urinary (tract) infections: Secondary | ICD-10-CM | POA: Diagnosis not present

## 2018-05-10 MED ORDER — LIDOCAINE HCL 1 % IJ SOLN
INTRAMUSCULAR | Status: AC
  Filled 2018-05-10: qty 20

## 2018-05-10 MED ORDER — LIDOCAINE HCL 1 % IJ SOLN
INTRAMUSCULAR | Status: AC | PRN
  Administered 2018-05-10: 5 mL

## 2018-05-10 NOTE — Procedures (Signed)
Interventional Radiology Procedure Note  Procedure: Placement of a left basilic vein PICC. 48cm  Tip is positioned at the superior cavoatrial junction and catheter is ready for immediate use.  Complications: None Recommendations:  - Ok to use - Do not submerge - Routine line care   Signed,  Dulcy Fanny. Earleen Newport, DO

## 2018-05-15 DIAGNOSIS — N390 Urinary tract infection, site not specified: Secondary | ICD-10-CM | POA: Diagnosis not present

## 2018-05-15 DIAGNOSIS — N3000 Acute cystitis without hematuria: Secondary | ICD-10-CM | POA: Diagnosis not present

## 2018-05-15 DIAGNOSIS — Z87440 Personal history of urinary (tract) infections: Secondary | ICD-10-CM | POA: Diagnosis not present

## 2018-05-17 ENCOUNTER — Other Ambulatory Visit (HOSPITAL_COMMUNITY): Payer: PPO

## 2018-05-17 ENCOUNTER — Encounter (HOSPITAL_COMMUNITY): Payer: PPO

## 2018-05-17 DIAGNOSIS — N390 Urinary tract infection, site not specified: Secondary | ICD-10-CM | POA: Diagnosis not present

## 2018-05-17 DIAGNOSIS — N3000 Acute cystitis without hematuria: Secondary | ICD-10-CM | POA: Diagnosis not present

## 2018-05-17 DIAGNOSIS — Z87440 Personal history of urinary (tract) infections: Secondary | ICD-10-CM | POA: Diagnosis not present

## 2018-05-19 DIAGNOSIS — N3000 Acute cystitis without hematuria: Secondary | ICD-10-CM | POA: Diagnosis not present

## 2018-05-19 DIAGNOSIS — N390 Urinary tract infection, site not specified: Secondary | ICD-10-CM | POA: Diagnosis not present

## 2018-05-19 DIAGNOSIS — Z87440 Personal history of urinary (tract) infections: Secondary | ICD-10-CM | POA: Diagnosis not present

## 2018-05-23 ENCOUNTER — Telehealth: Payer: Self-pay

## 2018-05-23 DIAGNOSIS — R0602 Shortness of breath: Secondary | ICD-10-CM

## 2018-05-23 NOTE — Telephone Encounter (Signed)
Spoke with the patient, he stated he has been feeling more SOB lately. He also states he is lightheaded. I advised that he check his blood pressure and make sure he is drinking plenty of water. He denied swelling in his legs.

## 2018-05-23 NOTE — Telephone Encounter (Signed)
-----   Message from Sueanne Margarita, MD sent at 05/18/2018 11:13 PM EDT ----- Patient has a stress myoview ordered for 4/1 for SOB - please let patient know that we are trying to postpone stress tests for a month due to COVID-19.  Please find out if he has been having any more SOB or has he had CP

## 2018-05-24 ENCOUNTER — Ambulatory Visit (HOSPITAL_COMMUNITY): Payer: PPO

## 2018-05-24 ENCOUNTER — Encounter (HOSPITAL_COMMUNITY): Payer: PPO

## 2018-05-24 ENCOUNTER — Other Ambulatory Visit: Payer: PPO

## 2018-05-24 NOTE — Telephone Encounter (Signed)
Is SOB worse than when I saw him last

## 2018-05-26 NOTE — Telephone Encounter (Signed)
Spoke with the patient, he stated he does not feel any worsen then he did at the last OV. He stated his blood pressure was low the other day that is why he felt lightheaded.

## 2018-05-27 NOTE — Telephone Encounter (Signed)
Has he had any CP?

## 2018-05-29 NOTE — Telephone Encounter (Signed)
Please set up nuclear stress test for 3-4 weeks from now

## 2018-05-29 NOTE — Telephone Encounter (Signed)
He denied chest pain.

## 2018-05-29 NOTE — Telephone Encounter (Signed)
Attempted, will call later.

## 2018-05-30 NOTE — Telephone Encounter (Signed)
Spoke with the patient, he accepted having a Lexiscan stress test. He understood the instructions and a copy sent to [place].

## 2018-06-28 ENCOUNTER — Other Ambulatory Visit: Payer: Self-pay

## 2018-06-28 ENCOUNTER — Ambulatory Visit: Payer: PPO | Admitting: Internal Medicine

## 2018-06-28 ENCOUNTER — Ambulatory Visit (HOSPITAL_COMMUNITY): Payer: PPO | Attending: Cardiology

## 2018-06-28 DIAGNOSIS — R0602 Shortness of breath: Secondary | ICD-10-CM | POA: Diagnosis not present

## 2018-06-28 MED ORDER — TECHNETIUM TC 99M TETROFOSMIN IV KIT
10.1000 | PACK | Freq: Once | INTRAVENOUS | Status: AC | PRN
  Administered 2018-06-28: 10.1 via INTRAVENOUS
  Filled 2018-06-28: qty 11

## 2018-06-28 MED ORDER — REGADENOSON 0.4 MG/5ML IV SOLN
0.4000 mg | Freq: Once | INTRAVENOUS | Status: AC
  Administered 2018-06-28: 0.4 mg via INTRAVENOUS

## 2018-06-28 MED ORDER — TECHNETIUM TC 99M TETROFOSMIN IV KIT
31.9000 | PACK | Freq: Once | INTRAVENOUS | Status: AC | PRN
  Administered 2018-06-28: 31.9 via INTRAVENOUS
  Filled 2018-06-28: qty 32

## 2018-06-29 LAB — MYOCARDIAL PERFUSION IMAGING
LV dias vol: 92 mL (ref 62–150)
LV sys vol: 44 mL
Peak HR: 87 {beats}/min
Rest HR: 68 {beats}/min
SDS: 0
SRS: 0
SSS: 0
TID: 0.92

## 2018-07-19 ENCOUNTER — Ambulatory Visit: Payer: PPO | Admitting: Internal Medicine

## 2018-07-19 DIAGNOSIS — R300 Dysuria: Secondary | ICD-10-CM | POA: Diagnosis not present

## 2018-07-19 DIAGNOSIS — N390 Urinary tract infection, site not specified: Secondary | ICD-10-CM | POA: Diagnosis not present

## 2018-07-19 DIAGNOSIS — R3914 Feeling of incomplete bladder emptying: Secondary | ICD-10-CM | POA: Diagnosis not present

## 2018-07-19 DIAGNOSIS — N400 Enlarged prostate without lower urinary tract symptoms: Secondary | ICD-10-CM | POA: Diagnosis not present

## 2018-08-02 ENCOUNTER — Telehealth (HOSPITAL_COMMUNITY): Payer: Self-pay | Admitting: *Deleted

## 2018-08-02 ENCOUNTER — Telehealth: Payer: Self-pay | Admitting: *Deleted

## 2018-08-02 NOTE — Telephone Encounter (Signed)
    COVID-19 Pre-Screening Questions:  . In the past 7 to 10 days have you had a cough,  shortness of breath, headache, congestion, fever (100 or greater) body aches, chills, sore throat, or sudden loss of taste or sense of smell? . Have you been around anyone with known Covid 19. . Have you been around anyone who is awaiting Covid 19 test results in the past 7 to 10 days? . Have you been around anyone who has been exposed to Covid 19, or has mentioned symptoms of Covid 19 within the past 7 to 10 days?  If you have any concerns/questions about symptoms patients report during screening (either on the phone or at threshold). Contact the provider seeing the patient or DOD for further guidance.  If neither are available contact a member of the leadership team.          Contacted patient via phone call. No to all Covid 19 questions . Has a mask for  Lab visit.kb

## 2018-08-02 NOTE — Telephone Encounter (Signed)
COVID-19 Pre-Screening Questions:  . Do you currently have a fever? NOI  (yes = cancel and refer to pcp for e-visit) . Have you recently travelled on a cruise, internationally, or to [place], [place], [place], [place], [place], or [place], [place] [employer]) ? NO (yes = cancel, stay home, monitor symptoms, and contact pcp or initiate e-visit if symptoms develop) . Have you been in contact with someone that is currently pending confirmation of Covid19 testing or has been confirmed to have the [place] virus? NO (yes = cancel, stay home, away from tested individual, monitor symptoms, and contact pcp or initiate e-visit if symptoms develop) . Are you currently experiencing fatigue or cough?NO (yes = pt should be prepared to have a mask placed at the time of their visit). . Reiterated no additional visitors. Eartha Inch no earlier than 15 minutes before appointment time. . Please bring own mask.

## 2018-08-04 ENCOUNTER — Other Ambulatory Visit: Payer: Self-pay

## 2018-08-04 ENCOUNTER — Encounter (HOSPITAL_COMMUNITY): Payer: PPO

## 2018-08-04 ENCOUNTER — Ambulatory Visit (HOSPITAL_COMMUNITY): Payer: PPO | Attending: Cardiology

## 2018-08-04 ENCOUNTER — Other Ambulatory Visit: Payer: PPO | Admitting: *Deleted

## 2018-08-04 DIAGNOSIS — E785 Hyperlipidemia, unspecified: Secondary | ICD-10-CM | POA: Diagnosis not present

## 2018-08-04 DIAGNOSIS — R0602 Shortness of breath: Secondary | ICD-10-CM | POA: Insufficient documentation

## 2018-08-04 LAB — HEPATIC FUNCTION PANEL
ALT: 11 IU/L (ref 0–44)
AST: 12 IU/L (ref 0–40)
Albumin: 3.8 g/dL (ref 3.6–4.6)
Alkaline Phosphatase: 67 IU/L (ref 39–117)
Bilirubin Total: 0.3 mg/dL (ref 0.0–1.2)
Bilirubin, Direct: 0.07 mg/dL (ref 0.00–0.40)
Total Protein: 6.1 g/dL (ref 6.0–8.5)

## 2018-08-04 LAB — LIPID PANEL
Chol/HDL Ratio: 5.6 ratio — ABNORMAL HIGH (ref 0.0–5.0)
Cholesterol, Total: 206 mg/dL — ABNORMAL HIGH (ref 100–199)
HDL: 37 mg/dL — ABNORMAL LOW (ref >39)
LDL Calculated: 139 mg/dL — ABNORMAL HIGH (ref 0–99)
Triglycerides: 149 mg/dL (ref 0–149)
VLDL Cholesterol Cal: 30 mg/dL (ref 5–40)

## 2018-08-08 ENCOUNTER — Telehealth: Payer: Self-pay

## 2018-08-08 DIAGNOSIS — R0602 Shortness of breath: Secondary | ICD-10-CM

## 2018-08-08 DIAGNOSIS — E785 Hyperlipidemia, unspecified: Secondary | ICD-10-CM

## 2018-08-08 MED ORDER — PRAVASTATIN SODIUM 40 MG PO TABS: 40.0000 mg | ORAL_TABLET | Freq: Every evening | ORAL | 3 refills | Status: DC

## 2018-08-08 NOTE — Telephone Encounter (Signed)
Spoke with the patient, he accepting the referral to pulmonology and ProBNP. He also increased his pravastatin and is going to have repeat labs on 09/20/18.

## 2018-08-08 NOTE — Telephone Encounter (Signed)
-----   Message from Sueanne Margarita, MD sent at 08/04/2018  1:23 PM EDT ----- Please have him come in for BNP and refer to pulmonary

## 2018-08-08 NOTE — Telephone Encounter (Signed)
Notes recorded by Sueanne Margarita, MD on 08/04/2018 at 4:47 PM EDT  Increase pravastatin to 40mg  daily and repeat FLP and ALT in 6 weeks  Notes recorded by Sueanne Margarita, MD on 08/04/2018 at 1:22 PM EDT  Echo showed normal LVF with mildly increased stiffness of heart (age related), mildly enlarged LA, trivial leakiness of the MV and TV

## 2018-08-09 ENCOUNTER — Other Ambulatory Visit: Payer: Self-pay

## 2018-08-09 ENCOUNTER — Other Ambulatory Visit: Payer: PPO | Admitting: *Deleted

## 2018-08-09 DIAGNOSIS — R0602 Shortness of breath: Secondary | ICD-10-CM

## 2018-08-10 ENCOUNTER — Telehealth: Payer: Self-pay

## 2018-08-10 LAB — PRO B NATRIURETIC PEPTIDE: NT-Pro BNP: 1162 pg/mL — ABNORMAL HIGH (ref 0–486)

## 2018-08-10 NOTE — Telephone Encounter (Signed)
Spoke with the patient, he expressed understanding about his result. Called [facility], the patient is scheduled to see Juanell Fairly on 08/11/18 at 11:30 am.

## 2018-08-10 NOTE — Telephone Encounter (Signed)
-----   Message from Sueanne Margarita, MD sent at 08/10/2018  2:14 PM EDT ----- He has evidence of volume overload but may be due to worsening renal function.  He needs to see renal ASAP.  Please see if you can get him in to see someone in nephrology tomorrow

## 2018-08-11 DIAGNOSIS — N184 Chronic kidney disease, stage 4 (severe): Secondary | ICD-10-CM | POA: Diagnosis not present

## 2018-08-11 DIAGNOSIS — E039 Hypothyroidism, unspecified: Secondary | ICD-10-CM | POA: Diagnosis not present

## 2018-08-11 DIAGNOSIS — E875 Hyperkalemia: Secondary | ICD-10-CM | POA: Diagnosis not present

## 2018-08-11 DIAGNOSIS — E213 Hyperparathyroidism, unspecified: Secondary | ICD-10-CM | POA: Diagnosis not present

## 2018-08-11 DIAGNOSIS — D649 Anemia, unspecified: Secondary | ICD-10-CM | POA: Diagnosis not present

## 2018-08-11 DIAGNOSIS — I129 Hypertensive chronic kidney disease with stage 1 through stage 4 chronic kidney disease, or unspecified chronic kidney disease: Secondary | ICD-10-CM | POA: Diagnosis not present

## 2018-08-11 DIAGNOSIS — I951 Orthostatic hypotension: Secondary | ICD-10-CM | POA: Diagnosis not present

## 2018-08-11 DIAGNOSIS — M109 Gout, unspecified: Secondary | ICD-10-CM | POA: Diagnosis not present

## 2018-08-11 DIAGNOSIS — E785 Hyperlipidemia, unspecified: Secondary | ICD-10-CM | POA: Diagnosis not present

## 2018-08-11 DIAGNOSIS — N400 Enlarged prostate without lower urinary tract symptoms: Secondary | ICD-10-CM | POA: Diagnosis not present

## 2018-08-14 DIAGNOSIS — N184 Chronic kidney disease, stage 4 (severe): Secondary | ICD-10-CM | POA: Diagnosis not present

## 2018-08-23 ENCOUNTER — Other Ambulatory Visit: Payer: Self-pay | Admitting: Internal Medicine

## 2018-08-24 DIAGNOSIS — I129 Hypertensive chronic kidney disease with stage 1 through stage 4 chronic kidney disease, or unspecified chronic kidney disease: Secondary | ICD-10-CM | POA: Diagnosis not present

## 2018-08-24 DIAGNOSIS — N184 Chronic kidney disease, stage 4 (severe): Secondary | ICD-10-CM | POA: Diagnosis not present

## 2018-08-28 ENCOUNTER — Encounter: Payer: Self-pay | Admitting: Internal Medicine

## 2018-08-28 ENCOUNTER — Ambulatory Visit (INDEPENDENT_AMBULATORY_CARE_PROVIDER_SITE_OTHER): Payer: PPO | Admitting: Internal Medicine

## 2018-08-28 ENCOUNTER — Other Ambulatory Visit: Payer: Self-pay

## 2018-08-28 DIAGNOSIS — N401 Enlarged prostate with lower urinary tract symptoms: Secondary | ICD-10-CM | POA: Diagnosis not present

## 2018-08-28 DIAGNOSIS — N183 Chronic kidney disease, stage 3 unspecified: Secondary | ICD-10-CM

## 2018-08-28 DIAGNOSIS — R634 Abnormal weight loss: Secondary | ICD-10-CM

## 2018-08-28 DIAGNOSIS — I10 Essential (primary) hypertension: Secondary | ICD-10-CM

## 2018-08-28 DIAGNOSIS — R350 Frequency of micturition: Secondary | ICD-10-CM

## 2018-08-28 DIAGNOSIS — R7989 Other specified abnormal findings of blood chemistry: Secondary | ICD-10-CM

## 2018-08-28 DIAGNOSIS — D485 Neoplasm of uncertain behavior of skin: Secondary | ICD-10-CM | POA: Diagnosis not present

## 2018-08-28 DIAGNOSIS — E039 Hypothyroidism, unspecified: Secondary | ICD-10-CM | POA: Diagnosis not present

## 2018-08-28 NOTE — Assessment & Plan Note (Signed)
Skin lesion on back - skin bx

## 2018-08-28 NOTE — Assessment & Plan Note (Signed)
Labetalol, Amlodipine

## 2018-08-28 NOTE — Assessment & Plan Note (Signed)
Wt Readings from Last 3 Encounters:  08/28/18 197 lb (89.4 kg)  06/28/18 192 lb (87.1 kg)  05/03/18 192 lb 1.9 oz (87.1 kg)

## 2018-08-28 NOTE — Assessment & Plan Note (Signed)
Labs

## 2018-08-28 NOTE — Assessment & Plan Note (Signed)
S/p TURP

## 2018-08-28 NOTE — Progress Notes (Signed)
Subjective:  Patient ID: Kenneth Williams, male    DOB: [birth year]  Age: 83 y.o. MRN: [id number]  CC: No chief complaint on file.   HPI Kenneth Williams presents for B12 def, gout, hypothyroidism, BPH f/u C/o itchy lesion on the back  Outpatient Medications Prior to Visit  Medication Sig Dispense Refill  . acetaminophen (TYLENOL) 500 MG tablet Take 500 mg by mouth 2 (two) times daily as needed for mild pain.    Marland Kitchen aspirin EC 81 MG tablet Take 1 tablet (81 mg total) by mouth daily. 100 tablet 3  . cholecalciferol (VITAMIN D) 1000 UNITS tablet Take 1,000 Units by mouth daily.     . clotrimazole-betamethasone (LOTRISONE) cream Apply 1 application topically 2 (two) times daily. 45 g 1  . colchicine 0.6 MG tablet TAKE 1 TABLET (0.6 MG TOTAL) BY MOUTH 3 (THREE) TIMES DAILY AS NEEDED. FOR GOUT 60 tablet 2  . Cyanocobalamin (VITAMIN B 12 PO) Take 1 tablet by mouth daily.      . ferrous sulfate 325 (65 FE) MG tablet Take 1 tablet (325 mg total) by mouth daily. 30 tablet 6  . fluticasone (FLONASE) 50 MCG/ACT nasal spray Place 2 sprays into both nostrils daily. 16 g 6  . labetalol (NORMODYNE) 300 MG tablet Take 1 tablet (300 mg total) by mouth 3 (three) times daily. (Patient taking differently: Take 300 mg by mouth 2 (two) times daily. ) 90 tablet 11  . levothyroxine (SYNTHROID, LEVOTHROID) 88 MCG tablet TAKE 1 TABLET (88 MCG TOTAL) BY MOUTH DAILY. 90 tablet 3  . Niacin (VITAMIN B-3 PO) Take 1 tablet by mouth daily.    . polyethylene glycol (MIRALAX / GLYCOLAX) packet Take 17 g by mouth daily as needed for mild constipation. 14 each 0  . pravastatin (PRAVACHOL) 40 MG tablet Take 1 tablet (40 mg total) by mouth every evening. 90 tablet 3  . saccharomyces boulardii (FLORASTOR) 250 MG capsule Take 1 capsule (250 mg total) by mouth 2 (two) times daily. 60 capsule 0  . amLODipine (NORVASC) 5 MG tablet Take 1 tablet (5 mg total) by mouth daily. 180 tablet 3  . pravastatin (PRAVACHOL) 20 MG tablet TAKE 1  TABLET BY MOUTH EVERY DAY 90 tablet 3   No facility-administered medications prior to visit.     ROS: Review of Systems  Constitutional: Negative for appetite change, fatigue and unexpected weight change.  HENT: Negative for congestion, nosebleeds, sneezing, sore throat and trouble swallowing.   Eyes: Negative for itching and visual disturbance.  Respiratory: Negative for cough.   Cardiovascular: Negative for chest pain, palpitations and leg swelling.  Gastrointestinal: Negative for abdominal distention, blood in stool, diarrhea and nausea.  Genitourinary: Positive for frequency and urgency. Negative for hematuria.  Musculoskeletal: Negative for back pain, gait problem, joint swelling and neck pain.  Skin: Negative for rash.  Neurological: Negative for dizziness, tremors, speech difficulty and weakness.  Psychiatric/Behavioral: Positive for decreased concentration. Negative for agitation, dysphoric mood, sleep disturbance and suicidal ideas. The patient is not nervous/anxious.     Objective:  BP (!) 152/78 (BP Location: Left Arm, Patient Position: Sitting, Cuff Size: Large)   Pulse 79   Temp 97.9 F (36.6 C) (Oral)   Ht _0  (1.88 m)   Wt 197 lb (89.4 kg)   SpO2 97%   BMI 25.29 kg/m   BP Readings from Last 3 Encounters:  08/28/18 (!) 152/78  05/03/18 (!) 152/80  04/03/18 (!) 146/82    Wt Readings from  Last 3 Encounters:  08/28/18 197 lb (89.4 kg)  06/28/18 192 lb (87.1 kg)  05/03/18 192 lb 1.9 oz (87.1 kg)    Physical Exam Constitutional:      General: He is not in acute distress.    Appearance: He is well-developed.     Comments: NAD  Eyes:     Conjunctiva/sclera: Conjunctivae normal.     Pupils: Pupils are equal, round, and reactive to light.  Neck:     Musculoskeletal: Normal range of motion.     Thyroid: No thyromegaly.     Vascular: No JVD.  Cardiovascular:     Rate and Rhythm: Normal rate and regular rhythm.     Heart sounds: Normal heart sounds. No  murmur. No friction rub. No gallop.   Pulmonary:     Effort: Pulmonary effort is normal. No respiratory distress.     Breath sounds: Normal breath sounds. No wheezing or rales.  Chest:     Chest wall: No tenderness.  Abdominal:     General: Bowel sounds are normal. There is no distension.     Palpations: Abdomen is soft. There is no mass.     Tenderness: There is no abdominal tenderness. There is no guarding or rebound.  Musculoskeletal: Normal range of motion.        General: No tenderness.  Lymphadenopathy:     Cervical: No cervical adenopathy.  Skin:    General: Skin is warm and dry.     Findings: No rash.  Neurological:     Mental Status: He is alert and oriented to person, place, and time.     Cranial Nerves: No cranial nerve deficit.     Motor: No abnormal muscle tone.     Coordination: Coordination normal.     Gait: Gait normal.     Deep Tendon Reflexes: Reflexes are normal and symmetric.  Psychiatric:        Behavior: Behavior normal.        Thought Content: Thought content normal.        Judgment: Judgment normal.   skin lesion on back  Lab Results  Component Value Date   WBC 9.7 01/25/2018   HGB 12.6 (L) 01/25/2018   HCT 37.9 (L) 01/25/2018   PLT 264.0 01/25/2018   GLUCOSE 97 01/25/2018   CHOL 206 (H) 08/04/2018   TRIG 149 08/04/2018   HDL 37 (L) 08/04/2018   LDLDIRECT 123.0 03/25/2014   LDLCALC 139 (H) 08/04/2018   ALT 11 08/04/2018   AST 12 08/04/2018   NA 139 01/25/2018   K 4.6 01/25/2018   CL 109 01/25/2018   CREATININE 2.34 (H) 01/25/2018   BUN 27 (H) 01/25/2018   CO2 22 01/25/2018   TSH 1.58 01/25/2018   PSA 1.73 05/18/2016   INR 1.03 09/13/2017    Irpicc Placement Left >5 Yrs Inc Img Guide  Result Date: 05/10/2018 INDICATION: 83 year old male with a history of IV antibiotics EXAM: PICC LINE PLACEMENT WITH ULTRASOUND AND FLUOROSCOPIC GUIDANCE MEDICATIONS: None ANESTHESIA/SEDATION: None FLUOROSCOPY TIME:  Fluoroscopy Time: 0 minutes 24 seconds  (2.3 mGy). COMPLICATIONS: None PROCEDURE: Informed written consent was obtained from the patient after a thorough discussion of the procedural risks, benefits and alternatives. All questions were addressed. Maximal Sterile Barrier Technique was utilized including caps, mask, sterile gowns, sterile gloves, sterile drape, hand hygiene and skin antiseptic. A timeout was performed prior to the initiation of the procedure. Patient was position in the supine position on the fluoroscopy table with the left arm  abducted 90 degrees. Ultrasound survey of the upper extremity was performed with images stored and sent to PACs. The left brachial vein was selected for access. Once the patient was prepped and draped in the usual sterile fashion, the skin and subcutaneous tissues were generously infiltrated with 1% lidocaine for local anesthesia. A micropuncture access kit was then used to access the targeted vein. Wire was passed centrally, confirmed to be within the venous system under fluoroscopy. A small stab incision was made with an 11 blade scalpel and the sheath was then placed over the wire. Estimated length of the catheter was then performed with the indwelling wire. Catheter was amputated at 48 cm length and placed with coaxial wire through the peel-away. Single-lumen, power injectable PICC in the left brachial vein. Tip confirmed at the cavoatrial junction, and the catheter is ready for use. Stat lock was placed. Patient tolerated the procedure well and remained hemodynamically stable throughout. No complications were encountered and no significant blood loss was encountered. IMPRESSION: Status post left arm PICC.  Catheter ready for use. Signed, Dulcy Fanny. Dellia Nims, RPVI [facility] Radiology Electronically Signed   By: Corrie Mckusick D.O.   On: 05/10/2018 15:46    Assessment & Plan:   There are no diagnoses linked to this encounter.   No orders of the defined  types were placed in this encounter.    Follow-up: No follow-ups on file.  Walker Kehr, MD

## 2018-08-28 NOTE — Assessment & Plan Note (Signed)
On Levothroid 

## 2018-08-31 ENCOUNTER — Ambulatory Visit (INDEPENDENT_AMBULATORY_CARE_PROVIDER_SITE_OTHER): Payer: PPO | Admitting: Internal Medicine

## 2018-08-31 ENCOUNTER — Other Ambulatory Visit: Payer: Self-pay

## 2018-08-31 ENCOUNTER — Other Ambulatory Visit: Payer: PPO

## 2018-08-31 ENCOUNTER — Encounter: Payer: Self-pay | Admitting: Internal Medicine

## 2018-08-31 VITALS — BP 150/78 | HR 64 | Temp 97.7°F | Ht 74.0 in | Wt 197.0 lb

## 2018-08-31 DIAGNOSIS — L723 Sebaceous cyst: Secondary | ICD-10-CM | POA: Diagnosis not present

## 2018-08-31 DIAGNOSIS — D485 Neoplasm of uncertain behavior of skin: Secondary | ICD-10-CM

## 2018-08-31 DIAGNOSIS — H02826 Cysts of left eye, unspecified eyelid: Secondary | ICD-10-CM

## 2018-08-31 DIAGNOSIS — L720 Epidermal cyst: Secondary | ICD-10-CM | POA: Diagnosis not present

## 2018-08-31 DIAGNOSIS — D2210 Melanocytic nevi of unspecified eyelid, including canthus: Secondary | ICD-10-CM | POA: Diagnosis not present

## 2018-08-31 HISTORY — DX: Cysts of left eye, unspecified eyelid: H02.826

## 2018-08-31 NOTE — Assessment & Plan Note (Signed)
Options discussed. Pt asked me to remove

## 2018-08-31 NOTE — Addendum Note (Signed)
Addended by: Karren Cobble on: 08/31/2018 02:36 PM   Modules accepted: Orders

## 2018-08-31 NOTE — Assessment & Plan Note (Signed)
See procedure 

## 2018-08-31 NOTE — Progress Notes (Signed)
Subjective:  Patient ID: Kenneth Williams, male    DOB: 10/06/1935  Age: 83 y.o. MRN: [id number]  CC: No chief complaint on file.   HPI Kenneth Williams presents for skin bx of the lesion on back C/o bothersome cyst under L eye   Outpatient Medications Prior to Visit  Medication Sig Dispense Refill   acetaminophen (TYLENOL) 500 MG tablet Take 500 mg by mouth 2 (two) times daily as needed for mild pain.     aspirin EC 81 MG tablet Take 1 tablet (81 mg total) by mouth daily. 100 tablet 3   cholecalciferol (VITAMIN D) 1000 UNITS tablet Take 1,000 Units by mouth daily.      clotrimazole-betamethasone (LOTRISONE) cream Apply 1 application topically 2 (two) times daily. 45 g 1   colchicine 0.6 MG tablet TAKE 1 TABLET (0.6 MG TOTAL) BY MOUTH 3 (THREE) TIMES DAILY AS NEEDED. FOR GOUT 60 tablet 2   Cyanocobalamin (VITAMIN B 12 PO) Take 1 tablet by mouth daily.       ferrous sulfate 325 (65 FE) MG tablet Take 1 tablet (325 mg total) by mouth daily. 30 tablet 6   fluticasone (FLONASE) 50 MCG/ACT nasal spray Place 2 sprays into both nostrils daily. 16 g 6   labetalol (NORMODYNE) 300 MG tablet Take 1 tablet (300 mg total) by mouth 3 (three) times daily. (Patient taking differently: Take 300 mg by mouth 2 (two) times daily. ) 90 tablet 11   levothyroxine (SYNTHROID, LEVOTHROID) 88 MCG tablet TAKE 1 TABLET (88 MCG TOTAL) BY MOUTH DAILY. 90 tablet 3   Niacin (VITAMIN B-3 PO) Take 1 tablet by mouth daily.     polyethylene glycol (MIRALAX / GLYCOLAX) packet Take 17 g by mouth daily as needed for mild constipation. 14 each 0   pravastatin (PRAVACHOL) 40 MG tablet Take 1 tablet (40 mg total) by mouth every evening. 90 tablet 3   saccharomyces boulardii (FLORASTOR) 250 MG capsule Take 1 capsule (250 mg total) by mouth 2 (two) times daily. 60 capsule 0   amLODipine (NORVASC) 5 MG tablet Take 1 tablet (5 mg total) by mouth daily. 180 tablet 3   No facility-administered medications prior to visit.       ROS: Review of Systems  Constitutional: Negative for fever.  HENT: Negative for postnasal drip and rhinorrhea.   Respiratory: Negative for cough.   Gastrointestinal: Negative for diarrhea.    Objective:  BP (!) 150/78 (BP Location: Left Arm, Patient Position: Sitting, Cuff Size: Normal)    Pulse 64    Temp 97.7 F (36.5 C) (Oral)    Ht _0  (1.88 m)    Wt 197 lb (89.4 kg)    SpO2 97%    BMI 25.29 kg/m   BP Readings from Last 3 Encounters:  08/31/18 (!) 150/78  08/28/18 (!) 152/78  05/03/18 (!) 152/80    Wt Readings from Last 3 Encounters:  08/31/18 197 lb (89.4 kg)  08/28/18 197 lb (89.4 kg)  06/28/18 192 lb (87.1 kg)    Physical Exam Constitutional:      Appearance: Normal appearance.  Skin:    Findings: Lesion present. No erythema.  Neurological:     Mental Status: He is oriented to person, place, and time.  Psychiatric:        Mood and Affect: Mood normal.     6x4 mm seb cyst L lower eyelid    Procedure Note :     Procedure :  Skin biopsy  Indication:  Changing mole (s ),  Suspicious lesion(s)   Risks including unsuccessful procedure , bleeding, infection, bruising, scar, a need for another complete procedure and others were explained to the patient in detail as well as the benefits. Informed consent was obtained.  The patient was placed in a decubitus position.  Lesion #1 on  R LS back   measuring  6x6   mm   Skin over lesion #1  was prepped with Betadine and alcohol  and anesthetized with 1 cc of 2% lidocaine and epinephrine, using a 25-gauge 1 inch needle.  Shave biopsy with a sterile Dermablade was carried out in the usual fashion. Hyfrecator was used to destroy the rest of the lesion potentially left behind and for hemostasis. Band-Aid was applied with antibiotic ointment.    Lesion #2 on L lower eyelid    measuring 6x4  mm   Skin over lesion #2  was prepped with Betadine and alcohol  and anesthetized with 1 cc of 2% lidocaine and epinephrine,  using a 25-gauge 1 inch needle.  Excisional biopsy with a sterile #11 plade was carried out in the usual fashion - 5 mm. Cyst was shelled out w/forecepse. Dermabond was applied.    Tolerated well. Complications none.     Lab Results  Component Value Date   WBC 9.7 01/25/2018   HGB 12.6 (L) 01/25/2018   HCT 37.9 (L) 01/25/2018   PLT 264.0 01/25/2018   GLUCOSE 97 01/25/2018   CHOL 206 (H) 08/04/2018   TRIG 149 08/04/2018   HDL 37 (L) 08/04/2018   LDLDIRECT 123.0 03/25/2014   LDLCALC 139 (H) 08/04/2018   ALT 11 08/04/2018   AST 12 08/04/2018   NA 139 01/25/2018   K 4.6 01/25/2018   CL 109 01/25/2018   CREATININE 2.34 (H) 01/25/2018   BUN 27 (H) 01/25/2018   CO2 22 01/25/2018   TSH 1.58 01/25/2018   PSA 1.73 05/18/2016   INR 1.03 09/13/2017    Irpicc Placement Left >5 Yrs Inc Img Guide  Result Date: 05/10/2018 INDICATION: 83 year old male with a history of IV antibiotics EXAM: PICC LINE PLACEMENT WITH ULTRASOUND AND FLUOROSCOPIC GUIDANCE MEDICATIONS: None ANESTHESIA/SEDATION: None FLUOROSCOPY TIME:  Fluoroscopy Time: 0 minutes 24 seconds (2.3 mGy). COMPLICATIONS: None PROCEDURE: Informed written consent was obtained from the patient after a thorough discussion of the procedural risks, benefits and alternatives. All questions were addressed. Maximal Sterile Barrier Technique was utilized including caps, mask, sterile gowns, sterile gloves, sterile drape, hand hygiene and skin antiseptic. A timeout was performed prior to the initiation of the procedure. Patient was position in the supine position on the fluoroscopy table with the left arm abducted 90 degrees. Ultrasound survey of the upper extremity was performed with images stored and sent to PACs. The left brachial vein was selected for access. Once the patient was prepped and draped in the usual sterile fashion, the skin and subcutaneous tissues were generously infiltrated with 1% lidocaine for local anesthesia. A micropuncture  access kit was then used to access the targeted vein. Wire was passed centrally, confirmed to be within the venous system under fluoroscopy. A small stab incision was made with an 11 blade scalpel and the sheath was then placed over the wire. Estimated length of the catheter was then performed with the indwelling wire. Catheter was amputated at 48 cm length and placed with coaxial wire through the peel-away. Single-lumen, power injectable PICC in the left brachial vein. Tip confirmed at the cavoatrial junction, and the catheter  is ready for use. Stat lock was placed. Patient tolerated the procedure well and remained hemodynamically stable throughout. No complications were encountered and no significant blood loss was encountered. IMPRESSION: Status post left arm PICC.  Catheter ready for use. Signed, Dulcy Fanny. Dellia Nims, RPVI [facility] Radiology Electronically Signed   By: Corrie Mckusick D.O.   On: 05/10/2018 15:46    Assessment & Plan:   There are no diagnoses linked to this encounter.   No orders of the defined types were placed in this encounter.    Follow-up: No follow-ups on file.  Walker Kehr, MD

## 2018-09-12 DIAGNOSIS — E785 Hyperlipidemia, unspecified: Secondary | ICD-10-CM | POA: Diagnosis not present

## 2018-09-12 DIAGNOSIS — I129 Hypertensive chronic kidney disease with stage 1 through stage 4 chronic kidney disease, or unspecified chronic kidney disease: Secondary | ICD-10-CM | POA: Diagnosis not present

## 2018-09-12 DIAGNOSIS — M109 Gout, unspecified: Secondary | ICD-10-CM | POA: Diagnosis not present

## 2018-09-12 DIAGNOSIS — I951 Orthostatic hypotension: Secondary | ICD-10-CM | POA: Diagnosis not present

## 2018-09-12 DIAGNOSIS — E875 Hyperkalemia: Secondary | ICD-10-CM | POA: Diagnosis not present

## 2018-09-12 DIAGNOSIS — D539 Nutritional anemia, unspecified: Secondary | ICD-10-CM | POA: Diagnosis not present

## 2018-09-12 DIAGNOSIS — N400 Enlarged prostate without lower urinary tract symptoms: Secondary | ICD-10-CM | POA: Diagnosis not present

## 2018-09-12 DIAGNOSIS — R809 Proteinuria, unspecified: Secondary | ICD-10-CM | POA: Diagnosis not present

## 2018-09-12 DIAGNOSIS — N184 Chronic kidney disease, stage 4 (severe): Secondary | ICD-10-CM | POA: Diagnosis not present

## 2018-09-12 DIAGNOSIS — D649 Anemia, unspecified: Secondary | ICD-10-CM | POA: Diagnosis not present

## 2018-09-12 DIAGNOSIS — E213 Hyperparathyroidism, unspecified: Secondary | ICD-10-CM | POA: Diagnosis not present

## 2018-09-12 DIAGNOSIS — E039 Hypothyroidism, unspecified: Secondary | ICD-10-CM | POA: Diagnosis not present

## 2018-09-14 ENCOUNTER — Telehealth: Payer: Self-pay | Admitting: *Deleted

## 2018-09-14 DIAGNOSIS — I517 Cardiomegaly: Secondary | ICD-10-CM

## 2018-09-14 DIAGNOSIS — I951 Orthostatic hypotension: Secondary | ICD-10-CM

## 2018-09-14 DIAGNOSIS — R809 Proteinuria, unspecified: Secondary | ICD-10-CM

## 2018-09-14 NOTE — Telephone Encounter (Signed)
-----   Message from Sueanne Margarita, MD sent at 09/12/2018  7:57 PM EDT ----- Regarding: RE: echo findings I will order a PYP scan for amyloid since he does have LVH on echo.  Pat could you order a nuclear medicine PYP scan for amyloid  Traci ----- Message ----- From: Justin Mend, MD Sent: 09/12/2018   1:00 PM EDT To: Sueanne Margarita, MD Subject: echo findings                                  Hi Traci -  We have a mutual patient, Kenneth Williams, that I follow in nephrology clinic.  He has been having fairly profound orthostatic hypotension lately.  I saw him today and discontinued his amlodipine 5mg  daily but I'm starting a work up for amyloid -- he has the hypotension, proteinuria, worsening renal function and a peripheral neuropathy (with no clear etiology for neuropathy).  He had an echo last month.  Was there any suggestion or findings consistent with amyloid?  Is an elevated proBNP seen with cardiac amyloid?  Thanks, Jannifer Hick

## 2018-09-14 NOTE — Telephone Encounter (Signed)
I spoke with pt and he would like to proceed with PYP scan. I told him there were no restrictions for test and that our scheduling team would contact him to schedule appointment.

## 2018-09-18 ENCOUNTER — Telehealth (HOSPITAL_COMMUNITY): Payer: Self-pay | Admitting: *Deleted

## 2018-09-18 NOTE — Telephone Encounter (Signed)
Attempted to call patient regarding upcoming appointment- no answer, unable to leave message. Kenneth Williams

## 2018-09-19 ENCOUNTER — Other Ambulatory Visit: Payer: Self-pay

## 2018-09-19 ENCOUNTER — Ambulatory Visit (HOSPITAL_COMMUNITY): Payer: PPO | Attending: Cardiology

## 2018-09-19 DIAGNOSIS — R809 Proteinuria, unspecified: Secondary | ICD-10-CM | POA: Diagnosis not present

## 2018-09-19 DIAGNOSIS — I517 Cardiomegaly: Secondary | ICD-10-CM | POA: Diagnosis not present

## 2018-09-19 DIAGNOSIS — I951 Orthostatic hypotension: Secondary | ICD-10-CM

## 2018-09-19 MED ORDER — TECHNETIUM TC 99M PYROPHOSPHATE
20.3000 | Freq: Once | INTRAVENOUS | Status: AC
  Administered 2018-09-19: 20.3 via INTRAVENOUS

## 2018-09-20 ENCOUNTER — Telehealth: Payer: Self-pay

## 2018-09-20 ENCOUNTER — Other Ambulatory Visit: Payer: PPO

## 2018-09-20 NOTE — Telephone Encounter (Signed)
LMTCB

## 2018-09-20 NOTE — Telephone Encounter (Signed)
Pt away from desk, left VM to call back to speak with triage nurse.

## 2018-09-20 NOTE — Telephone Encounter (Signed)
Pt agrees to having the labs recommended by Dr. [name] but he saw his Kidney MD Dr. Johnney Ou with [facility] last week 09/12/18 and was worried that she did these tests already... I advised pt that I will call and her office and then call him back to let him know if we still need them.. he can come to the office today or tomorrow.   LM for Stacy with Dr. Johnney Ou [phone] and asked that she calls our office with the needed lab info prior to [place] ordering them on the pt.   Pt will wait for a call back.

## 2018-09-20 NOTE — Telephone Encounter (Signed)
  Stacy called to give results. Please call office number and ask operator to page Stacy. [phone]

## 2018-09-20 NOTE — Telephone Encounter (Deleted)
LMTCB

## 2018-09-20 NOTE — Telephone Encounter (Signed)
Patient called back returning Ann's call

## 2018-09-20 NOTE — Telephone Encounter (Signed)
-----   Message from Sueanne Margarita, MD sent at 09/19/2018  4:42 PM EDT ----- Disregard cardiac MRI as patient has CKD with creatinine > 2.  Just order SPEP and UPEP

## 2018-09-21 NOTE — Telephone Encounter (Signed)
Follow up    Erline Levine at [facility] is returning call. She will be at lunch from 1-2. She advises when you call back to call [phone] and ask to be overhead paged.

## 2018-09-21 NOTE — Telephone Encounter (Signed)
Follow Up  Patient calling back in. Please give patient a call back.

## 2018-09-21 NOTE — Telephone Encounter (Signed)
Kenneth Williams will fax labs as pt did just have both that Dr [name] was requesting.Awaiting results./cy

## 2018-09-22 ENCOUNTER — Telehealth: Payer: Self-pay

## 2018-09-22 NOTE — Telephone Encounter (Signed)
Follow up     Called to schedule appt with pulmonary doctor.  Patient said he was waiting on a call from triage regarding labs from his kidney doctor and wanting to know what Dr [name] is going to do next.  Please call pt and give status.  Patient did not want to make appt with pulmonologist after he hears from the nurse.

## 2018-09-22 NOTE — Telephone Encounter (Signed)
-----   Message from Sueanne Margarita, MD sent at 09/21/2018  5:31 PM EDT ----- Patient has mild LVH on echo with enlarged atrial and diastolic dysfunction.  Now with orthostatic hypotension and CKD with concern that patient may have amyloid.  Please order a PYP amyloid scan

## 2018-09-22 NOTE — Telephone Encounter (Signed)
Spoke with pt and let him know we have not received his lab work from [facility]. Per past notes, they should be sending it over to [place].  Pt understands it is ok for him to schedule his appt with pulmonology regardless of pending lab work results. He has agreed and verbalized understanding.

## 2018-09-26 NOTE — Telephone Encounter (Signed)
Lab results have been scanned into patient's chart. Forwarding to Dr. [name] for review.

## 2018-09-27 ENCOUNTER — Other Ambulatory Visit: Payer: PPO

## 2018-09-29 ENCOUNTER — Other Ambulatory Visit: Payer: Self-pay

## 2018-09-29 ENCOUNTER — Other Ambulatory Visit: Payer: PPO | Admitting: *Deleted

## 2018-09-29 DIAGNOSIS — E785 Hyperlipidemia, unspecified: Secondary | ICD-10-CM | POA: Diagnosis not present

## 2018-09-29 LAB — HEPATIC FUNCTION PANEL
ALT: 9 IU/L (ref 0–44)
AST: 11 IU/L (ref 0–40)
Albumin: 3.7 g/dL (ref 3.6–4.6)
Alkaline Phosphatase: 68 IU/L (ref 39–117)
Bilirubin Total: 0.3 mg/dL (ref 0.0–1.2)
Bilirubin, Direct: 0.08 mg/dL (ref 0.00–0.40)
Total Protein: 5.7 g/dL — ABNORMAL LOW (ref 6.0–8.5)

## 2018-09-29 LAB — LIPID PANEL
Chol/HDL Ratio: 5.1 ratio — ABNORMAL HIGH (ref 0.0–5.0)
Cholesterol, Total: 205 mg/dL — ABNORMAL HIGH (ref 100–199)
HDL: 40 mg/dL (ref >39)
LDL Calculated: 139 mg/dL — ABNORMAL HIGH (ref 0–99)
Triglycerides: 128 mg/dL (ref 0–149)
VLDL Cholesterol Cal: 26 mg/dL (ref 5–40)

## 2018-10-02 ENCOUNTER — Other Ambulatory Visit: Payer: Self-pay | Admitting: *Deleted

## 2018-10-02 DIAGNOSIS — E785 Hyperlipidemia, unspecified: Secondary | ICD-10-CM

## 2018-10-02 DIAGNOSIS — K760 Fatty (change of) liver, not elsewhere classified: Secondary | ICD-10-CM

## 2018-10-02 DIAGNOSIS — E039 Hypothyroidism, unspecified: Secondary | ICD-10-CM

## 2018-10-02 MED ORDER — PRAVASTATIN SODIUM 80 MG PO TABS: 80.0000 mg | ORAL_TABLET | Freq: Every evening | ORAL | 3 refills | Status: DC

## 2018-10-04 NOTE — Progress Notes (Signed)
Synopsis: Referred in August 2020 for dyspnea on exertion by Sueanne Margarita, MD  Subjective:   PATIENT ID: Kenneth Williams GENDER: male DOB: 11/21/1935, MRN: [id number]  Chief Complaint  Patient presents with   Consult    Consult re: SOB. Recently seen cardiologist. Reports have SOB with exertion. He reports some intermittent dizziness.     83 year old gentleman with a past medical history of atherosclerotic coronary disease, catheterization in 2011 with nonobstructive CAD, hypertension hyperlipidemia.  Was seen by cardiology in March 2020 with ongoing complaints of shortness of breath and dyspnea on exertion.  Patient was referred here for further recommendations and management. He complains that after having a prostate surgery more than a year ago he has continued to have shortness of breath. He feels like he can only get up and move around for >15 mins before feeling like ha needs to sit down. Climbing stairs makes him SOB along with carrying heavy stuff. Life long non-smoker. Occupation: worked in Restaurant manager, fast food.  Per the daughter she feels like the patient has been clinically depressed for the past 5 years.  He does very little at home.  He sits at home most of the time.  Now he is deciding to get up and do more work outside.  When he does this he feels short of breath after about 10 to 15 minutes.  His daughter believes it is a lot related to inactivity.   Past Medical History:  Diagnosis Date   BPH (benign prostatic hyperplasia)    CAD (coronary artery disease)    cath 2011-mild non obstructive, coronary artery calcium score of 727 on CT 04/13/2018   Dyspnea    with activity   Elevated PSA    history of    Gout    2009   History of diverticulitis of colon    History of pancreatitis    History of TIAs    Hyperlipidemia    Hypertension    Left ear hearing loss    wears right hearing aid   without hearing aides deaf   Personal history of hyperthyroidism 2006   s/p  131I- Dr. Chalmers Cater, Hyperthyroid   Renal insufficiency 2011   TGA (transient global amnesia)      Family History  Problem Relation Age of Onset   Heart disease Sister    Dementia Mother    Hypertension Other    Colon cancer Neg Hx    Stomach cancer Neg Hx    Esophageal cancer Neg Hx    Rectal cancer Neg Hx      Past Surgical History:  Procedure Laterality Date   CHOLECYSTECTOMY     COLONOSCOPY     IR FLUORO GUIDE CV LINE RIGHT  09/14/2017   IR REMOVAL TUN CV CATH W/O FL  09/20/2017   IR US GUIDE VASC ACCESS RIGHT  09/14/2017   right ear  surgery     BAHA bone anchored  with screw   STAPEDES SURGERY     LEFT EAR   TRANSURETHRAL RESECTION OF PROSTATE     TRANSURETHRAL RESECTION OF PROSTATE N/A 11/04/2017   Procedure: TRANSURETHRAL RESECTION OF THE PROSTATE (TURP);  Surgeon: Franchot Gallo, MD;  Location: [facility];  Service: Urology;  Laterality: N/A;  1 HR    Social History   Socioeconomic History   Marital status: Widowed    Spouse name: Not on file   Number of children: 3   Years of education: Not on file   Highest education level:  Not on file  Occupational History   Not on file  Social Needs   Financial resource strain: Not on file   Food insecurity    Worry: Not on file    Inability: Not on file   Transportation needs    Medical: Not on file    Non-medical: Not on file  Tobacco Use   Smoking status: Never Smoker   Smokeless tobacco: Never Used  Substance and Sexual Activity   Alcohol use: No   Drug use: No   Sexual activity: Not Currently  Lifestyle   Physical activity    Days per week: Not on file    Minutes per session: Not on file   Stress: Not on file  Relationships   Social connections    Talks on phone: Not on file    Gets together: Not on file    Attends religious service: Not on file    Active member of club or organization: Not on file    Attends meetings of clubs or organizations: Not on file    Relationship  status: Not on file   Intimate partner violence    Fear of current or ex partner: Not on file    Emotionally abused: Not on file    Physically abused: Not on file    Forced sexual activity: Not on file  Other Topics Concern   Not on file  Social History Narrative   Patient is right handed.   Patient drinks 1-2 cups caffeine daily.     Allergies  Allergen Reactions   Lipitor [Atorvastatin]     diarrhea   Other     Dairy products-diarrhea    Spironolactone     gynecomastia   Zocor [Simvastatin]     achy   Coreg [Carvedilol] Diarrhea    Diarrhea and bradycardia   Donepezil Hydrochloride Diarrhea    REACTION: diarrhea   Hydrochlorothiazide Other (See Comments)    REACTION: Gout   Razadyne [Galantamine Hydrobromide] Diarrhea    diarrhea     Outpatient Medications Prior to Visit  Medication Sig Dispense Refill   acetaminophen (TYLENOL) 500 MG tablet Take 500 mg by mouth 2 (two) times daily as needed for mild pain.     aspirin EC 81 MG tablet Take 1 tablet (81 mg total) by mouth daily. 100 tablet 3   cholecalciferol (VITAMIN D) 1000 UNITS tablet Take 1,000 Units by mouth daily.      colchicine 0.6 MG tablet TAKE 1 TABLET (0.6 MG TOTAL) BY MOUTH 3 (THREE) TIMES DAILY AS NEEDED. FOR GOUT 60 tablet 2   Cyanocobalamin (VITAMIN B 12 PO) Take 1 tablet by mouth daily.       fluticasone (FLONASE) 50 MCG/ACT nasal spray Place 2 sprays into both nostrils daily. 16 g 6   labetalol (NORMODYNE) 300 MG tablet Take 1 tablet (300 mg total) by mouth 3 (three) times daily. (Patient taking differently: Take 300 mg by mouth 2 (two) times daily. ) 90 tablet 11   levothyroxine (SYNTHROID, LEVOTHROID) 88 MCG tablet TAKE 1 TABLET (88 MCG TOTAL) BY MOUTH DAILY. 90 tablet 3   Niacin (VITAMIN B-3 PO) Take 1 tablet by mouth daily.     pravastatin (PRAVACHOL) 80 MG tablet Take 1 tablet (80 mg total) by mouth every evening. 90 tablet 3   amLODipine (NORVASC) 5 MG tablet Take 1 tablet (5  mg total) by mouth daily. 180 tablet 3   polyethylene glycol (MIRALAX / GLYCOLAX) packet Take 17 g by mouth daily  as needed for mild constipation. 14 each 0   clotrimazole-betamethasone (LOTRISONE) cream Apply 1 application topically 2 (two) times daily. (Patient not taking: Reported on 10/05/2018) 45 g 1   ferrous sulfate 325 (65 FE) MG tablet Take 1 tablet (325 mg total) by mouth daily. (Patient not taking: Reported on 10/05/2018) 30 tablet 6   saccharomyces boulardii (FLORASTOR) 250 MG capsule Take 1 capsule (250 mg total) by mouth 2 (two) times daily. (Patient not taking: Reported on 10/05/2018) 60 capsule 0   No facility-administered medications prior to visit.     Review of Systems  Constitutional: Negative for chills, fever, malaise/fatigue and weight loss.  HENT: Negative for hearing loss, sore throat and tinnitus.   Eyes: Negative for blurred vision and double vision.  Respiratory: Positive for shortness of breath. Negative for cough, hemoptysis, sputum production, wheezing and stridor.   Cardiovascular: Negative for chest pain, palpitations, orthopnea, leg swelling and PND.  Gastrointestinal: Negative for abdominal pain, constipation, diarrhea, heartburn, nausea and vomiting.  Genitourinary: Negative for dysuria, hematuria and urgency.  Musculoskeletal: Negative for joint pain and myalgias.  Skin: Negative for itching and rash.  Neurological: Negative for dizziness, tingling, weakness and headaches.  Endo/Heme/Allergies: Negative for environmental allergies. Does not bruise/bleed easily.  Psychiatric/Behavioral: Negative for depression. The patient is not nervous/anxious and does not have insomnia.   All other systems reviewed and are negative.    Objective:  Physical Exam Vitals signs reviewed.  Constitutional:      General: He is not in acute distress.    Appearance: He is well-developed.  HENT:     Head: Normocephalic and atraumatic.  Eyes:     General: No scleral  icterus.    Conjunctiva/sclera: Conjunctivae normal.     Pupils: Pupils are equal, round, and reactive to light.  Neck:     Musculoskeletal: Neck supple.     Vascular: No JVD.     Trachea: No tracheal deviation.  Cardiovascular:     Rate and Rhythm: Normal rate and regular rhythm.     Heart sounds: Normal heart sounds. No murmur.  Pulmonary:     Effort: Pulmonary effort is normal. No tachypnea, accessory muscle usage or respiratory distress.     Breath sounds: Normal breath sounds. No stridor. No wheezing, rhonchi or rales.  Abdominal:     General: Bowel sounds are normal. There is no distension.     Palpations: Abdomen is soft.     Tenderness: There is no abdominal tenderness.  Musculoskeletal:        General: No tenderness.     Comments: Some muscle wasting in the hands Decreased muscle mass  Lymphadenopathy:     Cervical: No cervical adenopathy.  Skin:    General: Skin is warm and dry.     Capillary Refill: Capillary refill takes less than 2 seconds.     Findings: No rash.  Neurological:     Mental Status: He is alert and oriented to person, place, and time.  Psychiatric:        Behavior: Behavior normal.      Vitals:   10/05/18 0912  BP: (!) 160/90  Pulse: 63  Temp: 98.3 F (36.8 C)  TempSrc: Oral  SpO2: 98%  Weight: 198 lb 9.6 oz (90.1 kg)  Height: 6' 1.5" (1.867 m)   98% on RA BMI Readings from Last 3 Encounters:  10/05/18 25.85 kg/m  08/31/18 25.29 kg/m  08/28/18 25.29 kg/m   Wt Readings from Last 3 Encounters:  10/05/18 198  lb 9.6 oz (90.1 kg)  08/31/18 197 lb (89.4 kg)  08/28/18 197 lb (89.4 kg)     CBC    Component Value Date/Time   WBC 9.7 01/25/2018 0951   RBC 4.47 01/25/2018 0951   HGB 12.6 (L) 01/25/2018 0951   HCT 37.9 (L) 01/25/2018 0951   PLT 264.0 01/25/2018 0951   MCV 84.8 01/25/2018 0951   MCH 28.3 10/26/2017 1235   MCHC 33.3 01/25/2018 0951   RDW 13.5 01/25/2018 0951   LYMPHSABS 2.1 01/25/2018 0951   MONOABS 0.6  01/25/2018 0951   EOSABS 0.6 01/25/2018 0951   BASOSABS 0.1 01/25/2018 0951    Chest Imaging: Chest x-ray 10/26/2017: No active cardiopulmonary disease.The patient's images have been independently reviewed by me.    Pulmonary Functions Testing Results: No flowsheet data found.  FeNO: None   Pathology: None   Echocardiogram:  June 2020: Preserved left ventricular ejection fraction EF 55 to 60%, left ventricular diastolic function with impaired relaxation, basilar inferior hypokinesis.  Nuclear amyloid imaging: Equivocal study for ATTR amyloidosis with grade 1 uptake Consider cardiac MRI/SPEP/UPEP.  Heart Catheterization: None    Assessment & Plan:     ICD-10-CM   1. DOE (dyspnea on exertion)  R06.09 Pulmonary Function Test  2. SOB (shortness of breath)  R06.02     Discussion:  This is an 83 year old gentleman seen in clinic today for dyspnea on exertion.  He has had significant cardiac evaluations to include echocardiogram does have impaired relaxation and diastolic disease.  Currently being worked up for potential amyloid involvement. Lab work pending.   He did have a recent cardiac coronary CT that does give [place] a small view of the lung parenchyma.  The lung parenchyma on this image appears normal there is some scarring in the base.  He has had a recent chest x-ray that also looks normal.  He has never had pulmonary function test in the past. We discussed various multifactorial etiologies of dyspnea on exertion. Is a lifelong non-smoker.  No other history of significant lung disease.  We discussed the risk benefits alternatives of proceeding with pulmonary function test.  I believe this is the next best and simplest option to determine if there is any significant underlying lung disease.  We discussed the importance of increasing his exercise tolerance.  His daughter is going to get him out of the house and they are going to walk around the park at least 1 lap each day to  see if this helps improve his endurance some.  Greater than 50% of this patient's 45-minute office visit was spent face-to-face discussing above recommendations and treatment plan.  We also looked at images and made plans for the best diagnostic approach.   Current Outpatient Medications:    acetaminophen (TYLENOL) 500 MG tablet, Take 500 mg by mouth 2 (two) times daily as needed for mild pain., Disp: , Rfl:    aspirin EC 81 MG tablet, Take 1 tablet (81 mg total) by mouth daily., Disp: 100 tablet, Rfl: 3   cholecalciferol (VITAMIN D) 1000 UNITS tablet, Take 1,000 Units by mouth daily. , Disp: , Rfl:    colchicine 0.6 MG tablet, TAKE 1 TABLET (0.6 MG TOTAL) BY MOUTH 3 (THREE) TIMES DAILY AS NEEDED. FOR GOUT, Disp: 60 tablet, Rfl: 2   Cyanocobalamin (VITAMIN B 12 PO), Take 1 tablet by mouth daily.  , Disp: , Rfl:    fluticasone (FLONASE) 50 MCG/ACT nasal spray, Place 2 sprays into both nostrils daily., Disp: 16  g, Rfl: 6   labetalol (NORMODYNE) 300 MG tablet, Take 1 tablet (300 mg total) by mouth 3 (three) times daily. (Patient taking differently: Take 300 mg by mouth 2 (two) times daily. ), Disp: 90 tablet, Rfl: 11   levothyroxine (SYNTHROID, LEVOTHROID) 88 MCG tablet, TAKE 1 TABLET (88 MCG TOTAL) BY MOUTH DAILY., Disp: 90 tablet, Rfl: 3   Niacin (VITAMIN B-3 PO), Take 1 tablet by mouth daily., Disp: , Rfl:    pravastatin (PRAVACHOL) 80 MG tablet, Take 1 tablet (80 mg total) by mouth every evening., Disp: 90 tablet, Rfl: 3   amLODipine (NORVASC) 5 MG tablet, Take 1 tablet (5 mg total) by mouth daily., Disp: 180 tablet, Rfl: 3   polyethylene glycol (MIRALAX / GLYCOLAX) packet, Take 17 g by mouth daily as needed for mild constipation., Disp: 14 each, Rfl: 0   Garner Nash, DO [facility] 10/05/2018 10:10 AM

## 2018-10-05 ENCOUNTER — Encounter: Payer: Self-pay | Admitting: Pulmonary Disease

## 2018-10-05 ENCOUNTER — Other Ambulatory Visit: Payer: Self-pay

## 2018-10-05 ENCOUNTER — Ambulatory Visit (INDEPENDENT_AMBULATORY_CARE_PROVIDER_SITE_OTHER): Payer: PPO | Admitting: Pulmonary Disease

## 2018-10-05 VITALS — BP 160/90 | HR 63 | Temp 98.3°F | Ht 73.5 in | Wt 198.6 lb

## 2018-10-05 DIAGNOSIS — R0609 Other forms of dyspnea: Secondary | ICD-10-CM

## 2018-10-05 DIAGNOSIS — R0602 Shortness of breath: Secondary | ICD-10-CM | POA: Diagnosis not present

## 2018-10-05 NOTE — Patient Instructions (Signed)
Thank you for visiting Dr. Valeta Harms at [facility]. Today we recommend the following:  Orders Placed This Encounter  Procedures  . Pulmonary Function Test   Return in about 3 weeks (around 10/26/2018) for with APP.    Please do your part to reduce the spread of COVID-19.

## 2018-10-17 ENCOUNTER — Other Ambulatory Visit: Payer: Self-pay

## 2018-10-17 DIAGNOSIS — Z20822 Contact with and (suspected) exposure to covid-19: Secondary | ICD-10-CM

## 2018-10-19 LAB — NOVEL CORONAVIRUS, NAA: SARS-CoV-2, NAA: NOT DETECTED

## 2018-10-25 DIAGNOSIS — E785 Hyperlipidemia, unspecified: Secondary | ICD-10-CM | POA: Diagnosis not present

## 2018-10-25 DIAGNOSIS — D631 Anemia in chronic kidney disease: Secondary | ICD-10-CM | POA: Diagnosis not present

## 2018-10-25 DIAGNOSIS — R809 Proteinuria, unspecified: Secondary | ICD-10-CM | POA: Diagnosis not present

## 2018-10-25 DIAGNOSIS — N184 Chronic kidney disease, stage 4 (severe): Secondary | ICD-10-CM | POA: Diagnosis not present

## 2018-10-25 DIAGNOSIS — I951 Orthostatic hypotension: Secondary | ICD-10-CM | POA: Diagnosis not present

## 2018-10-25 DIAGNOSIS — E039 Hypothyroidism, unspecified: Secondary | ICD-10-CM | POA: Diagnosis not present

## 2018-10-25 DIAGNOSIS — E213 Hyperparathyroidism, unspecified: Secondary | ICD-10-CM | POA: Diagnosis not present

## 2018-10-25 DIAGNOSIS — N400 Enlarged prostate without lower urinary tract symptoms: Secondary | ICD-10-CM | POA: Diagnosis not present

## 2018-10-25 DIAGNOSIS — I129 Hypertensive chronic kidney disease with stage 1 through stage 4 chronic kidney disease, or unspecified chronic kidney disease: Secondary | ICD-10-CM | POA: Diagnosis not present

## 2018-10-26 ENCOUNTER — Other Ambulatory Visit: Payer: Self-pay | Admitting: Pulmonary Disease

## 2018-10-31 ENCOUNTER — Ambulatory Visit: Payer: PPO | Admitting: Primary Care

## 2018-10-31 ENCOUNTER — Other Ambulatory Visit: Payer: Self-pay | Admitting: Cardiology

## 2018-10-31 ENCOUNTER — Other Ambulatory Visit (HOSPITAL_COMMUNITY)
Admission: RE | Admit: 2018-10-31 | Discharge: 2018-10-31 | Disposition: A | Discharge status: Home / Self Care | Payer: PPO | Source: Ambulatory Visit | Attending: Pulmonary Disease | Admitting: Pulmonary Disease

## 2018-10-31 DIAGNOSIS — Z20828 Contact with and (suspected) exposure to other viral communicable diseases: Secondary | ICD-10-CM | POA: Insufficient documentation

## 2018-10-31 DIAGNOSIS — Z01812 Encounter for preprocedural laboratory examination: Secondary | ICD-10-CM | POA: Diagnosis not present

## 2018-10-31 LAB — SARS CORONAVIRUS 2 (TAT 6-24 HRS): SARS Coronavirus 2: NEGATIVE

## 2018-10-31 MED ORDER — PRAVASTATIN SODIUM 80 MG PO TABS: 80.0000 mg | ORAL_TABLET | Freq: Every evening | ORAL | 1 refills | Status: DC

## 2018-11-02 ENCOUNTER — Other Ambulatory Visit: Payer: Self-pay | Admitting: Cardiology

## 2018-11-02 MED ORDER — PRAVASTATIN SODIUM 80 MG PO TABS: 80.0000 mg | ORAL_TABLET | Freq: Every evening | ORAL | 1 refills | Status: DC

## 2018-11-02 NOTE — Telephone Encounter (Signed)
Pt's medication was sent to pt's pharmacy as requested. Confirmation received.  °

## 2018-11-03 ENCOUNTER — Ambulatory Visit: Payer: PPO | Admitting: Primary Care

## 2018-11-03 ENCOUNTER — Encounter: Payer: Self-pay | Admitting: Primary Care

## 2018-11-03 ENCOUNTER — Ambulatory Visit (INDEPENDENT_AMBULATORY_CARE_PROVIDER_SITE_OTHER): Payer: PPO | Admitting: Pulmonary Disease

## 2018-11-03 ENCOUNTER — Other Ambulatory Visit: Payer: Self-pay

## 2018-11-03 VITALS — BP 160/80 | HR 77 | Temp 98.1°F | Ht 73.0 in | Wt 196.4 lb

## 2018-11-03 DIAGNOSIS — R0609 Other forms of dyspnea: Secondary | ICD-10-CM | POA: Diagnosis not present

## 2018-11-03 DIAGNOSIS — J310 Chronic rhinitis: Secondary | ICD-10-CM | POA: Diagnosis not present

## 2018-11-03 LAB — PULMONARY FUNCTION TEST
DL/VA % pred: 100 %
DL/VA: 3.84 ml/min/mmHg/L
DLCO unc % pred: 86 %
DLCO unc: 23.07 ml/min/mmHg
FEF 25-75 Post: 2.89 L/sec
FEF 25-75 Pre: 2.52 L/sec
FEF2575-%Change-Post: 14 %
FEF2575-%Pred-Post: 130 %
FEF2575-%Pred-Pre: 113 %
FEV1-%Change-Post: 5 %
FEV1-%Pred-Post: 87 %
FEV1-%Pred-Pre: 82 %
FEV1-Post: 2.82 L
FEV1-Pre: 2.67 L
FEV1FVC-%Change-Post: -3 %
FEV1FVC-%Pred-Pre: 111 %
FEV6-%Change-Post: 6 %
FEV6-%Pred-Post: 84 %
FEV6-%Pred-Pre: 79 %
FEV6-Post: 3.56 L
FEV6-Pre: 3.35 L
FEV6FVC-%Change-Post: -3 %
FEV6FVC-%Pred-Post: 103 %
FEV6FVC-%Pred-Pre: 106 %
FVC-%Change-Post: 9 %
FVC-%Pred-Post: 81 %
FVC-%Pred-Pre: 74 %
FVC-Post: 3.68 L
FVC-Pre: 3.35 L
Post FEV1/FVC ratio: 77 %
Post FEV6/FVC ratio: 97 %
Pre FEV1/FVC ratio: 80 %
Pre FEV6/FVC Ratio: 100 %

## 2018-11-03 NOTE — Patient Instructions (Addendum)
Pulmonary function testing appears normal No evidence of underlying lung disease  Continue to work on physical conditioning/exericise  Walking, biking, swimming Weight training with 5-10lbs   Recommend mucinex (guaifenesin) twice daily for mucus/congestion Flonase 1 puff per nostril once daily  Follow up with cardiology and/or nephrology for BP  Return as needed if symptoms do not improve or worse     How to Increase Your Level of Physical Activity  Getting regular physical activity is important for your overall health and well-being. Most people do not get enough exercise. There are easy ways to increase your level of physical activity, even if you have not been very active in the past or you are just starting out. Why is physical activity important? Physical activity has many short-term and long-term health benefits. Regular exercise can:  Help you lose weight or maintain a healthy weight.  Strengthen your muscles and bones.  Boost your mood and improve self-esteem.  Reduce your risk of certain long-term (chronic) diseases, like heart disease, cancer, and diabetes.  Help you stay capable of walking and moving around (mobile) as you age.  Prevent accidents, such as falls, as you age.  Increase life expectancy. What are the benefits of being physically active on a regular basis? In addition to improving your physical health, being physically active on most days of the week can help you in ways that you may not expect. Benefits of regular physical activity may include:  Feeling good about your body.  Being able to move around more easily and for longer periods of time without getting tired (increased stamina).  Finding new sources of fun and enjoyment.  Meeting new people who share a common interest.  Being able to fight off illness better (enhanced immunity).  Being able to sleep better. What can happen if I am not physically active on a regular basis? Not getting  enough physical activity can lead to an unhealthy lifestyle and future health problems. This can increase your chances of:  Becoming overweight or obese.  Becoming sick.  Developing chronic illnesses, like heart disease or diabetes.  Having mental health problems, like depression or anxiety.  Having sleep problems.  Having trouble walking or getting yourself around (reduced mobility).  Injuring yourself in a fall as you get older. What steps can I take to be more physically active?   Check with your health care provider about how to get started. Ask your health care provider what activities are safe for you.  Start out slowly. Walking or doing some simple chair exercises is a good place to start, especially if you have not been active before or for a long time.  Try to find activities that you enjoy. You are more likely to commit to an exercise routine if it does not feel like a chore.  If you have bone or joint problems, choose low-impact exercises, like walking or swimming.  Include physical activity in your everyday routine.  Invite friends or family members to exercise with you. This also will help you commit to your workout plan.  Set goals that you can work toward.  Aim for at least 150 minutes of moderate-intensity exercise each week. Examples of moderate-intensity exercise include walking or riding a bike. Where to find more information  Centers for Disease Control and Prevention: BowlingGrip.is  President's Council on Fitness, Sports & Nutrition www.http://villegas.org/  ChooseMyPlate: WirelessMortgages.dk Contact a health care provider if:  You have headaches, muscle aches, or joint pain.  You feel dizzy or  light-headed while exercising.  You faint.  You have chest pain while exercising. Summary  Exercise benefits your mind and body at any age, even if you are just starting out.  If you have a chronic  illness or have not been active for a while, check with your health care provider before increasing your physical activity.  Choose activities that are safe and enjoyable for you. Ask your health care provider what activities are safe for you.  Start slowly. Tell your health care provider if you have problems as you start to increase your activity level. This information is not intended to replace advice given to you by your health care provider. Make sure you discuss any questions you have with your health care provider. Document Released: 01/29/2016 Document Revised: 06/05/2018 Document Reviewed: 01/29/2016 Elsevier Patient Education  [place].   Physical Activity With Heart Disease Being active has many benefits, especially if you have heart disease. Physical activity can help you do more and feel healthier. Start slowly, and increase the amount of time you spend being active. Most adults should aim for physical activity that:  Makes you breathe harder and raises your heart rate (aerobic activity). Try to get at least 150 minutes of aerobic activity each week. This is about 30 minutes each day, 5 days a week.  Helps build muscle strength (strengthening activity). Do this at least 2 times a week. Always talk with your health care provider before starting any new activity program or if you have any changes in your condition. What are the benefits of physical activity? When you have heart disease, physical activity can help:  Lower your blood pressure.  Lower your cholesterol.  Control your weight.  Improve your sleep.  Help control your blood sugar.  Improve your heart and lung function.  Reduce your risk for blood clots (thrombophlebitis).  Improve your energy level.  Reduce stress. What are some types of physical activity I could try? There are many ways to be active. Talk with your health care provider about what types and intensity of activity is right for you.  Aerobic activity  Aerobic (cardiovascular) activity can be moderate or vigorous intensity, depending on how hard you are working. Moderate-intensity activity includes:  Walking.  Slow bicycling.  Water aerobics.  Dancing.  Light gardening or house work. Vigorous-intensity activity includes:  Jogging or running.  Stair climbing.  Swimming laps.  Hiking uphill.  Heavy gardening, such as digging trenches.  Strengthening activity Strengthening activities work your muscles to build strength. Some examples include:  Doing push-ups, sit-ups, or pull-ups.  Lifting small weights.  Using resistance bands.  Flexibility Flexibility activities lengthen your muscles to keep them flexible and less tight and improve your balance. Some examples include:  Stretching.  Yoga.  Tai chi.  [place] barre.  Follow these instructions at home: How to get started  Talk with your health care provider about: ? What types of activities are safe for you. ? If you should check your pulse or take other precautions during physical activity.  Get a calendar. Write down a schedule and plan for your new routine.  Take time to find out what works for you. Consider: ? Joining a Camera operator, such as a biking group, yoga class, local gym, or swimming pool membership. ? Be active on your own by downloading free workout applications on a smartphone or other devices, or by purchasing workout DVDs.  If you have not been active, begin with sessions that last 10-15  minutes. Gradually work up to sessions that last 20-30 minutes, 5 times a week. Follow all of your health care provider's recommendations.  Be patient with yourself. It takes time to build up strength and lung capacity. Safety  Exercise in an indoor, climate-controlled facility, as told by your health care provider. You may need to do this if: ? There are extreme outdoor conditions, such as heat, humidity, or cold. ? There is an  air pollution advisory. Your local news, board of health, or hospital can provide information on air quality.  Take extra precautions as told by your health care provider. This may include: ? Monitoring your heart rate. ? Avoiding heavy lifting. ? Understanding how your medicines can affect you during physical activity. Certain medicines may cause heat intolerance or changes in blood sugar. ? Slowing down to rest when you need to. ? Keeping nitroglycerin spray and tablets with you at all times if you have angina. Use them as told to prevent and treat symptoms.  Drink plenty of water before, during, and after physical activity.  Know what symptoms may be signs of a problem. Stop physical activity right away if you have any of these symptoms. Get help right away if you have any of the following during exercise:  Chest pain, shortness of breath, or feel very tired.  Pain in the arm, shoulder, neck, or jaw.  Feel weak, dizzy, or light-headed.  An irregular heart rate, or your heart rate is greater than 100 beats per minute (bpm) before exercise. These symptoms may represent a serious problem that is an emergency. Do not wait to see if the symptoms go away. Get medical help right away. Call your local emergency services (911 in the [place] Do not drive yourself to the hospital.  Summary  Physical activity has many benefits, especially if you have heart disease.  Before starting an activity program, talk with your health care provider about how often to be active and what type of activity is safe for you.  Your physical activity plan may include moderate or vigorous aerobic activity, strengthening activities, and flexibility.  Know what symptoms may be signs of a problem. Stop physical activity right away and call emergency services (911 in the [place] if you have any of these symptoms. This information is not intended to replace advice given to you by your health care provider. Make sure you  discuss any questions you have with your health care provider. Document Released: 09/05/2013 Document Revised: 03/02/2017 Document Reviewed: 03/02/2017 Elsevier Patient Education  2020 [employer].

## 2018-11-03 NOTE — Assessment & Plan Note (Addendum)
-  Patient is life long non-smoker. No significant environmental exposure. Pulmonary function testing appears normal. No evidence of underlying lung disease. FEV1 87%, ratio 77; normal diffusion capacity. Clinical symptoms not consistent with asthma. Dyspnea likely d/t deconditioning and diastolic dysfunction. Continue to work on physical conditioning/exericise. Follow up with cardiology. Return as needed or if symptoms worsen

## 2018-11-03 NOTE — Progress Notes (Signed)
@Patient  ID: Kenneth Williams, male    DOB: 10-05-1935, 83 y.o.   MRN: [id number]  Chief Complaint  Patient presents with  . Follow-up    PFT results -     Referring provider: Plotnikov, Evie Lacks, MD  HPI: 83 year old male, never smoked. PMH significant for coronary artery disease, hypertension, orthostatic hypotension, TIA, DOE, hoarseness, memory loss.  Patient of Dr. Valeta Harms, seen for initial consult for dyspnea on 10/05/2018. Patient is a lifelong non-smoker.  He worked in Restaurant manager, fast food.  Dyspnea felt to be related to inactivity. Echocardiogram in June preserved EF 55-60%, left ventricular diastolic function with impaired relaxation. Normal parenchyma on image, some scarring in base. Patient was ordered for PFTs to rule out underlying lung disease. Covid-negative.    11/03/2018 Patient presents today for 3-4 week follow-up with PFTs. He is doing about the same. Reports fatigue and dyspnea after 10-76min of activity. He also has some upper airway cough and mucus production. He is not consistently using guaifenesin or flonase. He is following with nephrology for hypertension and orthostatic blood pressure. Denies fever, chest pain, wheezing.   Pulmonary function testing: PFTs 11/03/2018 - FVC 3.68 (81%), FEV1 2.82 (87%), ratio 77, DLCOunc 86%. No significant BD response. Diffusion capacity normal. Flow volume loop normal.   Allergies  Allergen Reactions  . Lipitor [Atorvastatin]     diarrhea  . Other     Dairy products-diarrhea   . Spironolactone     gynecomastia  . Zocor [Simvastatin]     achy  . Coreg [Carvedilol] Diarrhea    Diarrhea and bradycardia  . Donepezil Hydrochloride Diarrhea    REACTION: diarrhea  . Hydrochlorothiazide Other (See Comments)    REACTION: Gout  . Razadyne [Galantamine Hydrobromide] Diarrhea    diarrhea    Immunization History  Administered Date(s) Administered  . Influenza Split 02/12/2011, 11/23/2011  . Influenza Whole 11/16/2007, 02/04/2009  .  Influenza, High Dose Seasonal PF 12/05/2012, 12/22/2015, 12/02/2016, 12/12/2017  . Influenza,inj,Quad PF,6+ Mos 01/01/2014, 10/18/2014  . Pneumococcal Conjugate-13 06/07/2013  . Pneumococcal Polysaccharide-23 12/22/2015  . Tdap 07/25/2010    Past Medical History:  Diagnosis Date  . BPH (benign prostatic hyperplasia)   . CAD (coronary artery disease)    cath 2011-mild non obstructive, coronary artery calcium score of 727 on CT 04/13/2018  . Dyspnea    with activity  . Elevated PSA    history of   . Gout    2009  . History of diverticulitis of colon   . History of pancreatitis   . History of TIAs   . Hyperlipidemia   . Hypertension   . Left ear hearing loss    wears right hearing aid   without hearing aides deaf  . Personal history of hyperthyroidism 2006   s/p 131I- Dr. Chalmers Cater, Hyperthyroid  . Renal insufficiency 2011  . TGA (transient global amnesia)     Tobacco History: Social History   Tobacco Use  Smoking Status Never Smoker  Smokeless Tobacco Never Used   Counseling given: Not Answered   Outpatient Medications Prior to Visit  Medication Sig Dispense Refill  . aspirin EC 81 MG tablet Take 1 tablet (81 mg total) by mouth daily. 100 tablet 3  . cholecalciferol (VITAMIN D) 1000 UNITS tablet Take 1,000 Units by mouth daily.     . colchicine 0.6 MG tablet TAKE 1 TABLET (0.6 MG TOTAL) BY MOUTH 3 (THREE) TIMES DAILY AS NEEDED. FOR GOUT 60 tablet 2  . Cyanocobalamin (VITAMIN B 12 PO)  Take 1 tablet by mouth daily.      Marland Kitchen labetalol (NORMODYNE) 300 MG tablet Take 1 tablet (300 mg total) by mouth 3 (three) times daily. (Patient taking differently: Take 300 mg by mouth 2 (two) times daily. ) 90 tablet 11  . levothyroxine (SYNTHROID, LEVOTHROID) 88 MCG tablet TAKE 1 TABLET (88 MCG TOTAL) BY MOUTH DAILY. 90 tablet 3  . polyethylene glycol (MIRALAX / GLYCOLAX) packet Take 17 g by mouth daily as needed for mild constipation. 14 each 0  . pravastatin (PRAVACHOL) 80 MG tablet Take 1  tablet (80 mg total) by mouth every evening. 90 tablet 1  . acetaminophen (TYLENOL) 500 MG tablet Take 500 mg by mouth 2 (two) times daily as needed for mild pain.    Marland Kitchen amLODipine (NORVASC) 5 MG tablet Take 1 tablet (5 mg total) by mouth daily. 180 tablet 3  . fluticasone (FLONASE) 50 MCG/ACT nasal spray Place 2 sprays into both nostrils daily. (Patient not taking: Reported on 11/03/2018) 16 g 6  . Niacin (VITAMIN B-3 PO) Take 1 tablet by mouth daily.     No facility-administered medications prior to visit.    Review of Systems  Review of Systems  Constitutional: Positive for fatigue.  HENT: Positive for postnasal drip.   Respiratory: Positive for cough and shortness of breath. Negative for wheezing.    Physical Exam  BP (!) 160/80 (BP Location: Right Arm, Patient Position: Sitting, Cuff Size: Normal)   Pulse 77   Temp 98.1 F (36.7 C)   Ht 6\' 1"  (1.854 m)   Wt 196 lb 6.4 oz (89.1 kg)   SpO2 98%   BMI 25.91 kg/m  Physical Exam Constitutional:      General: He is not in acute distress.    Appearance: Normal appearance. He is not toxic-appearing.  HENT:     Head: Normocephalic and atraumatic.  Cardiovascular:     Rate and Rhythm: Normal rate and regular rhythm.  Pulmonary:     Effort: Pulmonary effort is normal. No respiratory distress.     Breath sounds: Normal breath sounds. No wheezing, rhonchi or rales.  Musculoskeletal:     Comments: Decreased muscle tone   Skin:    General: Skin is warm and dry.  Neurological:     General: No focal deficit present.     Mental Status: He is alert and oriented to person, place, and time. Mental status is at baseline.  Psychiatric:        Behavior: Behavior normal.        Thought Content: Thought content normal.        Judgment: Judgment normal.     Lab Results:  CBC    Component Value Date/Time   WBC 9.7 01/25/2018 0951   RBC 4.47 01/25/2018 0951   HGB 12.6 (L) 01/25/2018 0951   HCT 37.9 (L) 01/25/2018 0951   PLT 264.0  01/25/2018 0951   MCV 84.8 01/25/2018 0951   MCH 28.3 10/26/2017 1235   MCHC 33.3 01/25/2018 0951   RDW 13.5 01/25/2018 0951   LYMPHSABS 2.1 01/25/2018 0951   MONOABS 0.6 01/25/2018 0951   EOSABS 0.6 01/25/2018 0951   BASOSABS 0.1 01/25/2018 0951    BMET    Component Value Date/Time   NA 139 01/25/2018 0951   K 4.6 01/25/2018 0951   CL 109 01/25/2018 0951   CO2 22 01/25/2018 0951   GLUCOSE 97 01/25/2018 0951   BUN 27 (H) 01/25/2018 0951   CREATININE 2.34 (H) 01/25/2018 3235  CALCIUM 8.9 01/25/2018 0951   GFRNONAA 22 (L) 10/26/2017 1235   GFRAA 25 (L) 10/26/2017 1235    BNP No results found for: BNP  ProBNP    Component Value Date/Time   PROBNP 1,162 (H) 08/09/2018 1606    Imaging: No results found.   Assessment & Plan:   Dyspnea on exertion -Patient is life long non-smoker. No significant environmental exposure. Pulmonary function testing appears normal. No evidence of underlying lung disease. FEV1 87%, ratio 77; normal diffusion capacity. Clinical symptoms not consistent with asthma. Dyspnea likely d/t deconditioning and diastolic dysfunction. Continue to work on physical conditioning/exericise. Follow up with cardiology. Return as needed or if symptoms worsen   Rhinitis - Reports upper airway congestion/mucus production - Advised adherence to flonase nasal spray and mucinex twice daily      Martyn Ehrich, NP 11/03/2018

## 2018-11-03 NOTE — Assessment & Plan Note (Signed)
-   Reports upper airway congestion/mucus production - Advised adherence to flonase nasal spray and mucinex twice daily

## 2018-11-03 NOTE — Progress Notes (Signed)
PFT completed today.  

## 2018-11-10 NOTE — Progress Notes (Signed)
PCCM: Thanks for seeing him Garner Nash, DO [facility] 11/10/2018 12:23 PM

## 2018-11-13 ENCOUNTER — Telehealth: Payer: Self-pay | Admitting: Cardiology

## 2018-11-13 NOTE — Telephone Encounter (Signed)
I s/w pt and advised him that he is supposed to come in sometime this week for fasting labs. Pt is agreeable and has been scheduled for 10/2518 for fasting FLP/ALT. Pt thanked me for the call back and the help.

## 2018-11-13 NOTE — Telephone Encounter (Signed)
New Message:     Pt wants to know whe is he supposed to have his next lab work?

## 2018-11-17 ENCOUNTER — Other Ambulatory Visit: Payer: Self-pay

## 2018-11-17 ENCOUNTER — Other Ambulatory Visit: Payer: PPO | Admitting: *Deleted

## 2018-11-17 DIAGNOSIS — E785 Hyperlipidemia, unspecified: Secondary | ICD-10-CM | POA: Diagnosis not present

## 2018-11-17 DIAGNOSIS — K760 Fatty (change of) liver, not elsewhere classified: Secondary | ICD-10-CM

## 2018-11-17 LAB — LIPID PANEL
Chol/HDL Ratio: 4.9 ratio (ref 0.0–5.0)
Cholesterol, Total: 191 mg/dL (ref 100–199)
HDL: 39 mg/dL — ABNORMAL LOW (ref >39)
LDL Chol Calc (NIH): 130 mg/dL — ABNORMAL HIGH (ref 0–99)
Triglycerides: 119 mg/dL (ref 0–149)
VLDL Cholesterol Cal: 22 mg/dL (ref 5–40)

## 2018-11-17 LAB — ALT: ALT: 9 IU/L (ref 0–44)

## 2018-11-20 ENCOUNTER — Telehealth: Payer: Self-pay

## 2018-11-20 ENCOUNTER — Telehealth: Payer: Self-pay | Admitting: Cardiology

## 2018-11-20 DIAGNOSIS — E785 Hyperlipidemia, unspecified: Secondary | ICD-10-CM

## 2018-11-20 MED ORDER — ROSUVASTATIN CALCIUM 40 MG PO TABS: 40.0000 mg | ORAL_TABLET | Freq: Every day | ORAL | 3 refills | Status: DC

## 2018-11-20 NOTE — Telephone Encounter (Signed)
Notes recorded by Frederik Schmidt, RN on 11/20/2018 at 2:39 PM EDT  [id number] 9/28  ------

## 2018-11-20 NOTE — Telephone Encounter (Signed)
-----   Message from Sueanne Margarita, MD sent at 11/20/2018  1:28 PM EDT ----- LDL not at goal on pravastatin - LDL goal < 70.  Stop pravastatin and start Crestor 40mg  daily and check FLP and ALT in 6 weeks

## 2018-11-20 NOTE — Telephone Encounter (Signed)
The patient has been notified of the result and verbalized understanding.  All questions (if any) were answered. Frederik Schmidt, RN 11/20/2018 5:03 PM

## 2018-11-20 NOTE — Telephone Encounter (Signed)
New Message    Patient returning your call for lab results.

## 2018-11-28 ENCOUNTER — Telehealth: Payer: Self-pay | Admitting: Cardiology

## 2018-11-28 NOTE — Telephone Encounter (Signed)
I tried patient and could not leave message. 10/6

## 2018-11-28 NOTE — Telephone Encounter (Signed)
New Message  Pt c/o BP issue: STAT if pt c/o blurred vision, one-sided weakness or slurred speech  1. What are your last 5 BP readings? 206/93 hr 68 (laying) 181/85 hr 72 (sitting)  139/79 hr 75 (standing)  204/96 hr 63 (laying) 170/103 hr 74 (sitting) 150/91 hr 72 (standing)  2. Are you having any other symptoms (ex. Dizziness, headache, blurred vision, passed out)? Dizziness and headaches  3. What is your BP issue? Blood pressure is elevated when laying down and drops when he is standing up.    Appointment has been scheduled for 12/05/18 at 11:40 am with Dr. [name] as requested by patient's daughter. Patient is seeing Dr. Jannifer Franklin (Neurologist) on 12/04/18 at 7:00 am.

## 2018-12-04 ENCOUNTER — Other Ambulatory Visit: Payer: Self-pay

## 2018-12-04 ENCOUNTER — Encounter: Payer: Self-pay | Admitting: Internal Medicine

## 2018-12-04 ENCOUNTER — Ambulatory Visit (INDEPENDENT_AMBULATORY_CARE_PROVIDER_SITE_OTHER): Payer: PPO | Admitting: Neurology

## 2018-12-04 ENCOUNTER — Ambulatory Visit (INDEPENDENT_AMBULATORY_CARE_PROVIDER_SITE_OTHER): Payer: PPO | Admitting: Internal Medicine

## 2018-12-04 ENCOUNTER — Encounter: Payer: Self-pay | Admitting: Neurology

## 2018-12-04 ENCOUNTER — Telehealth: Payer: Self-pay | Admitting: Neurology

## 2018-12-04 VITALS — BP 162/90 | HR 86 | Temp 97.9°F | Ht 73.0 in | Wt 196.0 lb

## 2018-12-04 VITALS — BP 168/97 | HR 75 | Temp 97.8°F | Ht 73.0 in | Wt 195.0 lb

## 2018-12-04 DIAGNOSIS — R42 Dizziness and giddiness: Secondary | ICD-10-CM | POA: Diagnosis not present

## 2018-12-04 DIAGNOSIS — I10 Essential (primary) hypertension: Secondary | ICD-10-CM

## 2018-12-04 DIAGNOSIS — I2510 Atherosclerotic heart disease of native coronary artery without angina pectoris: Secondary | ICD-10-CM | POA: Diagnosis not present

## 2018-12-04 DIAGNOSIS — R413 Other amnesia: Secondary | ICD-10-CM

## 2018-12-04 DIAGNOSIS — N183 Chronic kidney disease, stage 3 unspecified: Secondary | ICD-10-CM | POA: Diagnosis not present

## 2018-12-04 DIAGNOSIS — Z23 Encounter for immunization: Secondary | ICD-10-CM

## 2018-12-04 DIAGNOSIS — F332 Major depressive disorder, recurrent severe without psychotic features: Secondary | ICD-10-CM | POA: Diagnosis not present

## 2018-12-04 DIAGNOSIS — R269 Unspecified abnormalities of gait and mobility: Secondary | ICD-10-CM

## 2018-12-04 DIAGNOSIS — E039 Hypothyroidism, unspecified: Secondary | ICD-10-CM | POA: Diagnosis not present

## 2018-12-04 MED ORDER — BUPROPION HCL ER (SR) 150 MG PO TB12: 150.0000 mg | ORAL_TABLET | Freq: Every day | ORAL | 3 refills | Status: DC

## 2018-12-04 NOTE — Progress Notes (Signed)
Cardiology Office Note:    Date:  12/05/2018   ID:  Kenneth Williams, DOB 1935/06/26, MRN [id number]  PCP:  Cassandria Anger, MD  Cardiologist:  No primary care provider on file.    Referring MD: Cassandria Anger, MD   Chief Complaint  Patient presents with  . Coronary Artery Disease  . Hypertension  . Shortness of Breath  . Hyperlipidemia    History of Present Illness:    Kenneth Williams is a 83 y.o. male with a hx of CAD with nonobstructive CAD by cath in 2011, hyperlipidemia, hypertension and elevated calcium score of 727.  Due to DOE at last OV a 2D echo was done showing normal LVF with G1 DD.  Nuclear stress test showed no ischemia.    He is here today for followup and is doing well.  He denies any chest pain or pressure, SOB, DOE, PND, orthopnea, LE edema, palpitations or syncope. He is compliant with his meds and is tolerating meds with no SE.  His daughter called in stating that his Bp has been erratic ranging anywhere from 139/79 to 206/95mmHg.  He has been having headaches and dizziness.  Bp mainly elevated when laying down and drops when standing up from 206>>130mmHg. He tells me that he has finally stopped checking his BP when laying down because it is always elevated.    Past Medical History:  Diagnosis Date  . BPH (benign prostatic hyperplasia)   . CAD (coronary artery disease)    cath 2011-mild non obstructive, coronary artery calcium score of 727 on CT 04/13/2018  . Dyspnea    with activity  . Elevated PSA    history of   . Gout    2009  . History of diverticulitis of colon   . History of pancreatitis   . History of TIAs   . Hyperlipidemia   . Hypertension   . Left ear hearing loss    wears right hearing aid   without hearing aides deaf  . Personal history of hyperthyroidism 2006   s/p 131I- Dr. Chalmers Cater, Hyperthyroid  . Renal insufficiency 2011  . TGA (transient global amnesia)     Past Surgical History:  Procedure Laterality Date  .  CHOLECYSTECTOMY    . COLONOSCOPY    . IR FLUORO GUIDE CV LINE RIGHT  09/14/2017  . IR REMOVAL TUN CV CATH W/O FL  09/20/2017  . IR US GUIDE VASC ACCESS RIGHT  09/14/2017  . right ear  surgery     BAHA bone anchored  with screw  . STAPEDES SURGERY     LEFT EAR  . TRANSURETHRAL RESECTION OF PROSTATE    . TRANSURETHRAL RESECTION OF PROSTATE N/A 11/04/2017   Procedure: TRANSURETHRAL RESECTION OF THE PROSTATE (TURP);  Surgeon: Franchot Gallo, MD;  Location: [facility];  Service: Urology;  Laterality: N/A;  1 HR    Current Medications: Current Meds  Medication Sig  . acetaminophen (TYLENOL) 500 MG tablet Take 500 mg by mouth 2 (two) times daily as needed for mild pain.  Marland Kitchen aspirin EC 81 MG tablet Take 1 tablet (81 mg total) by mouth daily.  Marland Kitchen buPROPion (WELLBUTRIN SR) 150 MG 12 hr tablet Take 1 tablet (150 mg total) by mouth daily.  . cholecalciferol (VITAMIN D) 1000 UNITS tablet Take 1,000 Units by mouth daily.   . colchicine 0.6 MG tablet TAKE 1 TABLET (0.6 MG TOTAL) BY MOUTH 3 (THREE) TIMES DAILY AS NEEDED. FOR GOUT  . Cyanocobalamin (VITAMIN B 12  PO) Take 1 tablet by mouth daily.    . fluticasone (FLONASE) 50 MCG/ACT nasal spray Place 2 sprays into both nostrils daily.  Marland Kitchen labetalol (NORMODYNE) 300 MG tablet Take 1 tablet (300 mg total) by mouth 3 (three) times daily. (Patient taking differently: Take 300 mg by mouth 2 (two) times daily. )  . levothyroxine (SYNTHROID, LEVOTHROID) 88 MCG tablet TAKE 1 TABLET (88 MCG TOTAL) BY MOUTH DAILY.  Marland Kitchen polyethylene glycol (MIRALAX / GLYCOLAX) packet Take 17 g by mouth daily as needed for mild constipation.  . rosuvastatin (CRESTOR) 40 MG tablet Take 1 tablet (40 mg total) by mouth daily.     Allergies:   Lipitor [atorvastatin], Other, Spironolactone, Zocor [simvastatin], Coreg [carvedilol], Donepezil hydrochloride, Hydrochlorothiazide, and Razadyne [galantamine hydrobromide]   Social History   Socioeconomic History  . Marital status: Widowed     Spouse name: Not on file  . Number of children: 3  . Years of education: Not on file  . Highest education level: Not on file  Occupational History  . Not on file  Social Needs  . Financial resource strain: Not on file  . Food insecurity    Worry: Not on file    Inability: Not on file  . Transportation needs    Medical: Not on file    Non-medical: Not on file  Tobacco Use  . Smoking status: Never Smoker  . Smokeless tobacco: Never Used  Substance and Sexual Activity  . Alcohol use: No  . Drug use: No  . Sexual activity: Not Currently  Lifestyle  . Physical activity    Days per week: Not on file    Minutes per session: Not on file  . Stress: Not on file  Relationships  . Social Herbalist on phone: Not on file    Gets together: Not on file    Attends religious service: Not on file    Active member of club or organization: Not on file    Attends meetings of clubs or organizations: Not on file    Relationship status: Not on file  Other Topics Concern  . Not on file  Social History Narrative   Patient is right handed.   Patient drinks 1-2 cups caffeine daily.     Family History: The patient's family history includes Dementia in his mother; Heart disease in his sister; Hypertension in an other family member. There is no history of Colon cancer, Stomach cancer, Esophageal cancer, or Rectal cancer.  ROS:   Please see the history of present illness.    ROS  All other systems reviewed and negative.   EKGs/Labs/Other Studies Reviewed:    The following studies were reviewed today:  2D echo and stress test  EKG:  EKG is not ordered today.   Recent Labs: 01/25/2018: BUN 27; Creatinine, Ser 2.34; Hemoglobin 12.6; Platelets 264.0; Potassium 4.6; Sodium 139; TSH 1.58 08/09/2018: NT-Pro BNP 1,162 11/17/2018: ALT 9   Recent Lipid Panel    Component Value Date/Time   CHOL 191 11/17/2018 0927   TRIG 119 11/17/2018 0927   HDL 39 (L) 11/17/2018 0927   CHOLHDL 4.9  11/17/2018 0927   CHOLHDL 6 08/29/2017 1605   VLDL 27.6 08/29/2017 1605   LDLCALC 130 (H) 11/17/2018 0927   LDLDIRECT 123.0 03/25/2014 1112    Physical Exam:    VS:  BP 134/84   Pulse 68   Ht 6\' 1"  (1.854 m)   Wt 192 lb (87.1 kg)   SpO2 98%  BMI 25.33 kg/m     Wt Readings from Last 3 Encounters:  12/05/18 192 lb (87.1 kg)  12/04/18 196 lb (88.9 kg)  12/04/18 195 lb (88.5 kg)     GEN:  Well nourished, well developed in no acute distress HEENT: Normal NECK: No JVD; No carotid bruits LYMPHATICS: No lymphadenopathy CARDIAC: RRR, no murmurs, rubs, gallops RESPIRATORY:  Clear to auscultation without rales, wheezing or rhonchi  ABDOMEN: Soft, non-tender, non-distended MUSCULOSKELETAL:  No edema; No deformity  SKIN: Warm and dry NEUROLOGIC:  Alert and oriented x 3 PSYCHIATRIC:  Normal affect   ASSESSMENT:    1. Coronary artery disease involving native coronary artery of native heart without angina pectoris   2. Essential hypertension   3. SOB (shortness of breath)   4. Dyslipidemia   5. Orthostatic hypotension    PLAN:    In order of problems listed above:  1.  ASCAD -nonobstructive CAD by cath in 2011 -denies any CP -continue ASA 81mg  daily, BB and statin.   2.  HTN -BP controlled on exam -since he is having problems with orthostatic hypotension I suspect the BB is exacerbating things due to suppressing chronotropic response to low BP -I will discuss with nephrology (Dr. Johnney Ou) whether we can change BB to another BP med  3.  SOB -lexiscan myoview showed no ischemia and 2D echo with normal LVF -G1DD on echo -PYP scan was equivocal for amyloid and cardiac MRI was ordered but not completed -Check SPEP and UPAP to rule out amyloid  4.  HLD -LDLgoal < 70 -LDL 130 last month -now on Crestor 40mg  daily -repeat FLP and ALT  5.  Orthostatic Hypotension -he has been having dizziness with supine HTN and orthostasis when standing although SBP still in the 130s  when he stands up. -encouraged him to use compression hose during the day - thigh high -cannot use Proamatine given that he is already having supine HTN and this will exacerbate it -Would avoid Florinef in setting of HTN  -will discuss with nephrology whether we can get him off of BB and onto another BP med   Medication Adjustments/Labs and Tests Ordered: Current medicines are reviewed at length with the patient today.  Concerns regarding medicines are outlined above.  No orders of the defined types were placed in this encounter.  No orders of the defined types were placed in this encounter.   Signed, Fransico Him, MD  12/05/2018 12:06 PM    [facility]

## 2018-12-04 NOTE — Assessment & Plan Note (Signed)
Doing fair 

## 2018-12-04 NOTE — Telephone Encounter (Signed)
Health team order sent to GI. No auth they will reach out to the patient to schedule.  

## 2018-12-04 NOTE — Assessment & Plan Note (Signed)
No angina 

## 2018-12-04 NOTE — Progress Notes (Signed)
Subjective:  Patient ID: Kenneth Williams, male    DOB: 1936/01/08  Age: 83 y.o. MRN: [id number]  CC: No chief complaint on file.   HPI Kenneth Williams presents for HTN, CAD, CRF and hypothyroidism f/u He saw Dr Jannifer Franklin today - Wellbutrin was given  Outpatient Medications Prior to Visit  Medication Sig Dispense Refill  . acetaminophen (TYLENOL) 500 MG tablet Take 500 mg by mouth 2 (two) times daily as needed for mild pain.    Marland Kitchen aspirin EC 81 MG tablet Take 1 tablet (81 mg total) by mouth daily. 100 tablet 3  . buPROPion (WELLBUTRIN SR) 150 MG 12 hr tablet Take 1 tablet (150 mg total) by mouth daily. 30 tablet 3  . cholecalciferol (VITAMIN D) 1000 UNITS tablet Take 1,000 Units by mouth daily.     . colchicine 0.6 MG tablet TAKE 1 TABLET (0.6 MG TOTAL) BY MOUTH 3 (THREE) TIMES DAILY AS NEEDED. FOR GOUT 60 tablet 2  . Cyanocobalamin (VITAMIN B 12 PO) Take 1 tablet by mouth daily.      . fluticasone (FLONASE) 50 MCG/ACT nasal spray Place 2 sprays into both nostrils daily. 16 g 6  . labetalol (NORMODYNE) 300 MG tablet Take 1 tablet (300 mg total) by mouth 3 (three) times daily. (Patient taking differently: Take 300 mg by mouth 2 (two) times daily. ) 90 tablet 11  . levothyroxine (SYNTHROID, LEVOTHROID) 88 MCG tablet TAKE 1 TABLET (88 MCG TOTAL) BY MOUTH DAILY. 90 tablet 3  . Niacin (VITAMIN B-3 PO) Take 1 tablet by mouth daily.    . polyethylene glycol (MIRALAX / GLYCOLAX) packet Take 17 g by mouth daily as needed for mild constipation. 14 each 0  . rosuvastatin (CRESTOR) 40 MG tablet Take 1 tablet (40 mg total) by mouth daily. 90 tablet 3   No facility-administered medications prior to visit.     ROS: Review of Systems  Constitutional: Positive for fatigue. Negative for appetite change and unexpected weight change.  HENT: Negative for congestion, nosebleeds, sneezing, sore throat and trouble swallowing.   Eyes: Negative for itching and visual disturbance.  Respiratory: Negative for cough.    Cardiovascular: Negative for chest pain, palpitations and leg swelling.  Gastrointestinal: Negative for abdominal distention, blood in stool, diarrhea and nausea.  Genitourinary: Positive for urgency. Negative for frequency and hematuria.  Musculoskeletal: Negative for back pain, gait problem, joint swelling and neck pain.  Skin: Negative for rash.  Neurological: Negative for dizziness, tremors, speech difficulty and weakness.  Psychiatric/Behavioral: Negative for agitation, dysphoric mood and sleep disturbance. The patient is not nervous/anxious.     Objective:  BP (!) 162/90 (BP Location: Left Arm, Patient Position: Sitting, Cuff Size: Normal)   Pulse 86   Temp 97.9 F (36.6 C) (Oral)   Ht 6\' 1"  (1.854 m)   Wt 196 lb (88.9 kg)   SpO2 98%   BMI 25.86 kg/m   BP Readings from Last 3 Encounters:  12/04/18 (!) 162/90  12/04/18 (!) 168/97  11/03/18 (!) 160/80    Wt Readings from Last 3 Encounters:  12/04/18 196 lb (88.9 kg)  12/04/18 195 lb (88.5 kg)  11/03/18 196 lb 6.4 oz (89.1 kg)    Physical Exam Constitutional:      General: He is not in acute distress.    Appearance: He is well-developed.     Comments: NAD  Eyes:     Conjunctiva/sclera: Conjunctivae normal.     Pupils: Pupils are equal, round, and reactive to light.  Neck:     Musculoskeletal: Normal range of motion.     Thyroid: No thyromegaly.     Vascular: No JVD.  Cardiovascular:     Rate and Rhythm: Normal rate and regular rhythm.     Heart sounds: Normal heart sounds. No murmur. No friction rub. No gallop.   Pulmonary:     Effort: Pulmonary effort is normal. No respiratory distress.     Breath sounds: Normal breath sounds. No wheezing or rales.  Chest:     Chest wall: No tenderness.  Abdominal:     General: Bowel sounds are normal. There is no distension.     Palpations: Abdomen is soft. There is no mass.     Tenderness: There is no abdominal tenderness. There is no guarding or rebound.   Musculoskeletal: Normal range of motion.        General: No tenderness.  Lymphadenopathy:     Cervical: No cervical adenopathy.  Skin:    General: Skin is warm and dry.     Findings: No rash.  Neurological:     Mental Status: He is alert and oriented to person, place, and time.     Cranial Nerves: No cranial nerve deficit.     Motor: No abnormal muscle tone.     Coordination: Coordination normal.     Gait: Gait normal.     Deep Tendon Reflexes: Reflexes are normal and symmetric.  Psychiatric:        Behavior: Behavior normal.        Thought Content: Thought content normal.        Judgment: Judgment normal.     Lab Results  Component Value Date   WBC 9.7 01/25/2018   HGB 12.6 (L) 01/25/2018   HCT 37.9 (L) 01/25/2018   PLT 264.0 01/25/2018   GLUCOSE 97 01/25/2018   CHOL 191 11/17/2018   TRIG 119 11/17/2018   HDL 39 (L) 11/17/2018   LDLDIRECT 123.0 03/25/2014   LDLCALC 130 (H) 11/17/2018   ALT 9 11/17/2018   AST 11 09/29/2018   NA 139 01/25/2018   K 4.6 01/25/2018   CL 109 01/25/2018   CREATININE 2.34 (H) 01/25/2018   BUN 27 (H) 01/25/2018   CO2 22 01/25/2018   TSH 1.58 01/25/2018   PSA 1.73 05/18/2016   INR 1.03 09/13/2017    No results found.  Assessment & Plan:   There are no diagnoses linked to this encounter.   No orders of the defined types were placed in this encounter.    Follow-up: No follow-ups on file.  Walker Kehr, MD

## 2018-12-04 NOTE — Assessment & Plan Note (Signed)
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

## 2018-12-04 NOTE — Assessment & Plan Note (Signed)
On Levothroid 

## 2018-12-04 NOTE — Patient Instructions (Signed)
If you have medicare related insurance (such as traditional Medicare, Blue Cross Medicare, [employer] Medicare, or similar), Please make an appointment at the scheduling desk with Jill, the Wellness Health Coach, for your Wellness visit in this office, which is a benefit with your insurance.  

## 2018-12-04 NOTE — Patient Instructions (Signed)
Wellbutrin will be started, 150 mg a day, call for any dose adjustments.

## 2018-12-04 NOTE — Progress Notes (Signed)
Reason for visit: Orthostatic hypotension, gait disorder, memory disorder, depression  Referring physician: Dr. [name] is a 83 y.o. male  History of present illness:  Kenneth Williams is a an 83 year old right-handed white male with a history of stage IV renal insufficiency.  The patient has had problems with hypertension, he has been on Norvasc and labetalol.  The Norvasc was increased to 5 mg twice daily in March 2020, and later on the labetalol was increased to a dose of 300 mg 3 times daily.  Within the last 5 or 6 months, he has begun to have some dizziness with standing, he feels somewhat off balance and staggering but he has not had any falls.  He denies any visual dimming or tunnel vision or syncope.  He reports no numbness or focal weakness of the arms or legs.  He does have chronic neck discomfort without radiation down the arms.  The patient reports no low back pain or pain down the legs.  He does have some urinary frequency, he has had a TURP previously, he denies issues controlling the bowels.  The patient has undergone a cardiac work-up, and a test for amyloidosis was equivocal.  He has had some dyspnea with exertion, particularly when he is walking uphill, he has seen a pulmonologist for this.  He has not been a smoker.  He has documented orthostatic hypotension, the amlodipine has been discontinued and the labetalol apparently was cut back to 300 mg twice daily.  He rarely gets blood pressures below 272 systolic.  He is hypertensive with sitting or lying down.  He is sent to this office for further evaluation.  He reports frequent headaches currently.  He has had decrease in appetite, but he has not had any significant weight loss.  He continues to have issues with memory and he feels a lack of motivation to do things, similar to what he had complained of when he was seen here 4 years ago.  The patient at that time was felt to be depressed, he had neuropsychological evaluation  that did not show a true dementia.  MRI at that time showed small vessel disease.  The patient has had a recent 2D echocardiogram with a normal ejection fraction.  Past Medical History:  Diagnosis Date   BPH (benign prostatic hyperplasia)    CAD (coronary artery disease)    cath 2011-mild non obstructive, coronary artery calcium score of 727 on CT 04/13/2018   Dyspnea    with activity   Elevated PSA    history of    Gout    2009   History of diverticulitis of colon    History of pancreatitis    History of TIAs    Hyperlipidemia    Hypertension    Left ear hearing loss    wears right hearing aid   without hearing aides deaf   Personal history of hyperthyroidism 2006   s/p 131I- Dr. Chalmers Cater, Hyperthyroid   Renal insufficiency 2011   TGA (transient global amnesia)     Past Surgical History:  Procedure Laterality Date   CHOLECYSTECTOMY     COLONOSCOPY     IR FLUORO GUIDE CV LINE RIGHT  09/14/2017   IR REMOVAL TUN CV CATH W/O FL  09/20/2017   IR US GUIDE VASC ACCESS RIGHT  09/14/2017   right ear  surgery     BAHA bone anchored  with screw   STAPEDES SURGERY     LEFT EAR  TRANSURETHRAL RESECTION OF PROSTATE     TRANSURETHRAL RESECTION OF PROSTATE N/A 11/04/2017   Procedure: TRANSURETHRAL RESECTION OF THE PROSTATE (TURP);  Surgeon: Kenneth Williams, Kenneth Williams;  Location: [facility];  Service: Urology;  Laterality: N/A;  1 HR    Family History  Problem Relation Age of Onset   Heart disease Sister    Dementia Mother    Hypertension Other    Colon cancer Neg Hx    Stomach cancer Neg Hx    Esophageal cancer Neg Hx    Rectal cancer Neg Hx     Social history:  reports that he has never smoked. He has never used smokeless tobacco. He reports that he does not drink alcohol or use drugs.  Medications:  Prior to Admission medications   Medication Sig Start Date End Date Taking? Authorizing Kenneth Williams  acetaminophen (TYLENOL) 500 MG tablet Take 500 mg by mouth 2  (two) times daily as needed for mild pain.   Yes Kenneth Williams, Historical, Kenneth Williams  aspirin EC 81 MG tablet Take 1 tablet (81 mg total) by mouth daily. 04/03/18 04/03/19 Yes Kenneth Williams, Kenneth Williams, Kenneth Williams  cholecalciferol (VITAMIN D) 1000 UNITS tablet Take 1,000 Units by mouth daily.    Yes Kenneth Williams, Historical, Kenneth Williams  colchicine 0.6 MG tablet TAKE 1 TABLET (0.6 MG TOTAL) BY MOUTH 3 (THREE) TIMES DAILY AS NEEDED. FOR GOUT 10/31/17  Yes Kenneth Williams, Kenneth Williams, Kenneth Williams  Cyanocobalamin (VITAMIN B 12 PO) Take 1 tablet by mouth daily.     Yes Kenneth Williams, Historical, Kenneth Williams  fluticasone (FLONASE) 50 MCG/ACT nasal spray Place 2 sprays into both nostrils daily. 12/12/17  Yes Kenneth Williams, Kenneth Williams, Kenneth Williams  labetalol (NORMODYNE) 300 MG tablet Take 1 tablet (300 mg total) by mouth 3 (three) times daily. Patient taking differently: Take 300 mg by mouth 2 (two) times daily.  03/02/18  Yes Kenneth Williams, Kenneth Williams, Kenneth Williams  levothyroxine (SYNTHROID, LEVOTHROID) 88 MCG tablet TAKE 1 TABLET (88 MCG TOTAL) BY MOUTH DAILY. 03/17/18  Yes Kenneth Williams, Kenneth Williams, Kenneth Williams  Niacin (VITAMIN B-3 PO) Take 1 tablet by mouth daily.   Yes Kenneth Williams, Historical, Kenneth Williams  polyethylene glycol (MIRALAX / GLYCOLAX) packet Take 17 g by mouth daily as needed for mild constipation. 09/19/17  Yes Kenneth Williams, Kenneth Williams  rosuvastatin (CRESTOR) 40 MG tablet Take 1 tablet (40 mg total) by mouth daily. 11/20/18 02/18/19 Yes Kenneth Williams, Kenneth Williams, Kenneth Williams      Allergies  Allergen Reactions   Lipitor [Atorvastatin]     diarrhea   Other     Dairy products-diarrhea    Spironolactone     gynecomastia   Zocor [Simvastatin]     achy   Coreg [Carvedilol] Diarrhea    Diarrhea and bradycardia   Donepezil Hydrochloride Diarrhea    REACTION: diarrhea   Hydrochlorothiazide Other (See Comments)    REACTION: Gout   Razadyne [Galantamine Hydrobromide] Diarrhea    diarrhea    ROS:  Out of a complete 14 system review of symptoms, the patient complains only of the following symptoms, and all other reviewed systems  are negative.  Fatigue Chest pain, palpitations of the heart, swelling the legs Ringing in the ears Itching Blurred vision Shortness of breath, cough Depression, anxiety, decreased energy, change in appetite  Blood pressure (!) 168/97, pulse 75, height 6\' 1"  (1.854 m), weight 195 lb (88.5 kg).   Blood pressure, right arm, lying down is 194/96.  Heart rate is 70.  Sitting, right arm, blood pressure is 168/97, heart rate 75.  Standing, right arm, blood pressure  is 1 three 6/84, heart rate 79.  Physical Exam  General: The patient is alert and cooperative at the time of the examination.  Eyes: Pupils are equal, round, and reactive to light. Discs are flat bilaterally.  Neck: The neck is supple, no carotid bruits are noted.  Respiratory: The respiratory examination is clear.  Cardiovascular: The cardiovascular examination reveals a regular rate and rhythm, no obvious murmurs or rubs are noted.  Skin: Extremities are without significant edema.  Neurologic Exam  Mental status: The patient is alert and oriented x 3 at the time of the examination. The patient has apparent normal recent and remote memory, with an apparently normal attention span and concentration ability.  Mini-Mental status examination done today shows a total score 29/30.  Cranial nerves: Facial symmetry is present. There is good sensation of the face to pinprick and soft touch bilaterally. The strength of the facial muscles and the muscles to head turning and shoulder shrug are normal bilaterally. Speech is well enunciated, no aphasia or dysarthria is noted. Extraocular movements are full. Visual fields are full. The tongue is midline, and the patient has symmetric elevation of the soft palate. No obvious hearing deficits are noted.  The patient is hard of hearing.  Motor: The motor testing reveals 5 over 5 strength of all 4 extremities. Good symmetric motor tone is noted throughout.  Sensory: Sensory testing is intact  to pinprick, soft touch, vibration sensation, and position sense on all 4 extremities.  No evidence of a stocking pattern pinprick sensory deficit was noted.  No evidence of extinction is noted.  Coordination: Cerebellar testing reveals good finger-nose-finger and heel-to-shin bilaterally.  Gait and station: Gait is minimally wide-based, the patient can walk independently. Tandem gait is unsteady. Romberg is negative. No drift is seen.  Reflexes: Deep tendon reflexes are symmetric and normal bilaterally.  The ankle jerk reflexes are well-maintained bilaterally.  Toes are downgoing bilaterally.   Assessment/Plan:  1.  Orthostatic hypotension  2.  Stage IV chronic renal insufficiency  3.  History of depression  4.  History of mild gait instability  The clinical examination does not support the presence of a peripheral neuropathy.  The patient is orthostatic, but he never appears to have severe drops in blood pressure, he has not had any visual dimming or syncope.  The patient does continue to report troubles with memory.  MRI of the brain will be done and a carotid Doppler study will be done.  The prior MRI did show small vessel ischemic changes.  He will be set up for physical therapy for gait training, and he will be placed on Wellbutrin for his depression.  His blood pressure medications have been lowered.  We will follow-up with him in about 4 months.  Kenneth Alexanders Kenneth Williams 12/04/2018 7:26 AM  [facility] [address] [place], [place] [address]  Phone [phone] Fax [phone]

## 2018-12-04 NOTE — Assessment & Plan Note (Signed)
He saw Dr Jannifer Franklin today - Wellbutrin was given

## 2018-12-04 NOTE — Assessment & Plan Note (Signed)
BP Readings from Last 3 Encounters:  12/04/18 (!) 162/90  12/04/18 (!) 168/97  11/03/18 (!) 160/80  Cardiology appt tomorrow - Dr [name]

## 2018-12-05 ENCOUNTER — Encounter: Payer: Self-pay | Admitting: Cardiology

## 2018-12-05 ENCOUNTER — Ambulatory Visit: Payer: PPO | Admitting: Cardiology

## 2018-12-05 VITALS — BP 134/84 | HR 68 | Ht 73.0 in | Wt 192.0 lb

## 2018-12-05 DIAGNOSIS — I10 Essential (primary) hypertension: Secondary | ICD-10-CM | POA: Diagnosis not present

## 2018-12-05 DIAGNOSIS — I951 Orthostatic hypotension: Secondary | ICD-10-CM

## 2018-12-05 DIAGNOSIS — E785 Hyperlipidemia, unspecified: Secondary | ICD-10-CM

## 2018-12-05 DIAGNOSIS — I2510 Atherosclerotic heart disease of native coronary artery without angina pectoris: Secondary | ICD-10-CM

## 2018-12-05 DIAGNOSIS — R0602 Shortness of breath: Secondary | ICD-10-CM | POA: Diagnosis not present

## 2018-12-05 NOTE — Patient Instructions (Signed)
Medication Instructions:  No changes If you need a refill on your cardiac medications before your next appointment, please call your pharmacy.   Lab work: Today: spep, upep (24 hr urine) If you have labs (blood work) drawn today and your tests are completely normal, you will receive your results only by: Marland Kitchen MyChart Message (if you have MyChart) OR . A paper copy in the mail If you have any lab test that is abnormal or we need to change your treatment, we will call you to review the results.  Testing/Procedures: none   Follow-Up: At [facility], you and your health needs are our priority.  As part of our continuing mission to provide you with exceptional heart care, we have created designated Provider Care Teams.  These Care Teams include your primary Cardiologist (physician) and Advanced Practice Providers (APPs -  Physician Assistants and Nurse Practitioners) who all work together to provide you with the care you need, when you need it. You will need a follow up appointment in 6 months with Dr. [name] and 3 months with one of the following Advanced Practice Providers on your designated Care Team:   Hazel, PA-C Melina Copa, PA-C . Ermalinda Barrios, PA-C   Any Other Special Instructions Will Be Listed Below (If Applicable). Dr. [name] has given you a prescription today for thigh high compression stockings

## 2018-12-05 NOTE — Addendum Note (Signed)
Addended by: Karren Cobble on: 12/05/2018 11:30 AM   Modules accepted: Orders

## 2018-12-06 ENCOUNTER — Other Ambulatory Visit: Payer: Self-pay | Admitting: Cardiology

## 2018-12-06 DIAGNOSIS — E785 Hyperlipidemia, unspecified: Secondary | ICD-10-CM | POA: Diagnosis not present

## 2018-12-06 DIAGNOSIS — I10 Essential (primary) hypertension: Secondary | ICD-10-CM | POA: Diagnosis not present

## 2018-12-06 DIAGNOSIS — I2510 Atherosclerotic heart disease of native coronary artery without angina pectoris: Secondary | ICD-10-CM | POA: Diagnosis not present

## 2018-12-06 DIAGNOSIS — I951 Orthostatic hypotension: Secondary | ICD-10-CM | POA: Diagnosis not present

## 2018-12-08 LAB — UPEP/UIFE/LIGHT CHAINS/TP, 24-HR UR
% BETA, Urine: 11.1 %
ALBUMIN, U: 68.6 %
ALPHA 1 URINE: 4.8 %
ALPHA-2-GLOBULIN, U: 7.2 %
Free Kappa Lt Chains,Ur: 143.98 mg/L — ABNORMAL HIGH (ref 0.63–113.79)
Free Lambda Lt Chains,Ur: 22.92 mg/L — ABNORMAL HIGH (ref 0.47–11.77)
GAMMA GLOBULIN URINE: 8.4 %
Kappa/Lambda Ratio,U: 6.28 (ref 1.03–31.76)
Protein, 24H Urine: 5568 mg/24 hr — ABNORMAL HIGH (ref 30–150)
Protein, Ur: 278.4 mg/dL

## 2018-12-08 LAB — PROTEIN ELECTROPHORESIS, SERUM
A/G Ratio: 1.2 (ref 0.7–1.7)
Albumin ELP: 3.2 g/dL (ref 2.9–4.4)
Alpha 1: 0.2 g/dL (ref 0.0–0.4)
Alpha 2: 0.9 g/dL (ref 0.4–1.0)
Beta: 0.9 g/dL (ref 0.7–1.3)
Gamma Globulin: 0.6 g/dL (ref 0.4–1.8)
Globulin, Total: 2.6 g/dL (ref 2.2–3.9)
Total Protein: 5.8 g/dL — ABNORMAL LOW (ref 6.0–8.5)

## 2018-12-14 ENCOUNTER — Ambulatory Visit (HOSPITAL_COMMUNITY)
Admission: RE | Admit: 2018-12-14 | Discharge: 2018-12-14 | Disposition: A | Discharge status: Home / Self Care | Payer: PPO | Source: Ambulatory Visit | Attending: Neurology | Admitting: Neurology

## 2018-12-14 ENCOUNTER — Telehealth: Payer: Self-pay | Admitting: Neurology

## 2018-12-14 ENCOUNTER — Other Ambulatory Visit: Payer: Self-pay

## 2018-12-14 DIAGNOSIS — R42 Dizziness and giddiness: Secondary | ICD-10-CM | POA: Insufficient documentation

## 2018-12-14 NOTE — Telephone Encounter (Signed)
I called the patient.  The carotid Doppler study is unremarkable, MRI of the brain will be done in the near future.    Carotid doppler 12/14/18:  Summary: Right Carotid: Velocities in the right ICA are consistent with a 1-39% stenosis.  Left Carotid: Velocities in the left ICA are consistent with a 1-39% stenosis.

## 2018-12-18 ENCOUNTER — Telehealth: Payer: Self-pay | Admitting: Neurology

## 2018-12-18 DIAGNOSIS — R269 Unspecified abnormalities of gait and mobility: Secondary | ICD-10-CM

## 2018-12-18 NOTE — Telephone Encounter (Signed)
  I called the patient.  The carotid Doppler study was unremarkable, MRI of the brain will be done next week.  He has not heard any thing about physical therapy for gait training, I will reorder this.  Carotid doppler 12/18/18:  Summary: Right Carotid: Velocities in the right ICA are consistent with a 1-39% stenosis.  Left Carotid: Velocities in the left ICA are consistent with a 1-39% stenosis.  Vertebrals:  Bilateral vertebral arteries demonstrate antegrade flow. Subclavians: Normal flow hemodynamics were seen in bilateral subclavian              arteries.

## 2018-12-25 ENCOUNTER — Ambulatory Visit (INDEPENDENT_AMBULATORY_CARE_PROVIDER_SITE_OTHER): Payer: PPO | Admitting: Internal Medicine

## 2018-12-25 ENCOUNTER — Other Ambulatory Visit: Payer: Self-pay

## 2018-12-25 ENCOUNTER — Encounter: Payer: Self-pay | Admitting: Internal Medicine

## 2018-12-25 VITALS — BP 122/82 | HR 64 | Temp 98.3°F | Ht 73.0 in | Wt 195.0 lb

## 2018-12-25 DIAGNOSIS — F332 Major depressive disorder, recurrent severe without psychotic features: Secondary | ICD-10-CM | POA: Diagnosis not present

## 2018-12-25 DIAGNOSIS — L570 Actinic keratosis: Secondary | ICD-10-CM | POA: Diagnosis not present

## 2018-12-25 DIAGNOSIS — R634 Abnormal weight loss: Secondary | ICD-10-CM

## 2018-12-25 MED ORDER — METHYLPHENIDATE HCL 5 MG PO TABS: 5.0000 mg | ORAL_TABLET | Freq: Every day | ORAL | 0 refills | Status: DC

## 2018-12-25 NOTE — Progress Notes (Signed)
Subjective:  Patient ID: Kenneth Williams, male    DOB: 1936-01-06  Age: 83 y.o. MRN: [id number]  CC: No chief complaint on file.   HPI GER RINGENBERG presents for depression, apathy, dysphoria.  He comes today with his daughter [name].  The patient has been overwhelmed with taking care of his disabled son Audry Pili.  He is complaining of a painful lesion on his nose   Outpatient Medications Prior to Visit  Medication Sig Dispense Refill   acetaminophen (TYLENOL) 500 MG tablet Take 500 mg by mouth 2 (two) times daily as needed for mild pain.     aspirin EC 81 MG tablet Take 1 tablet (81 mg total) by mouth daily. 100 tablet 3   buPROPion (WELLBUTRIN SR) 150 MG 12 hr tablet Take 1 tablet (150 mg total) by mouth daily. 30 tablet 3   cholecalciferol (VITAMIN D) 1000 UNITS tablet Take 1,000 Units by mouth daily.      colchicine 0.6 MG tablet TAKE 1 TABLET (0.6 MG TOTAL) BY MOUTH 3 (THREE) TIMES DAILY AS NEEDED. FOR GOUT 60 tablet 2   Cyanocobalamin (VITAMIN B 12 PO) Take 1 tablet by mouth daily.       fluticasone (FLONASE) 50 MCG/ACT nasal spray Place 2 sprays into both nostrils daily. 16 g 6   labetalol (NORMODYNE) 300 MG tablet Take 1 tablet (300 mg total) by mouth 3 (three) times daily. (Patient taking differently: Take 300 mg by mouth 2 (two) times daily. ) 90 tablet 11   levothyroxine (SYNTHROID, LEVOTHROID) 88 MCG tablet TAKE 1 TABLET (88 MCG TOTAL) BY MOUTH DAILY. 90 tablet 3   polyethylene glycol (MIRALAX / GLYCOLAX) packet Take 17 g by mouth daily as needed for mild constipation. 14 each 0   rosuvastatin (CRESTOR) 40 MG tablet Take 1 tablet (40 mg total) by mouth daily. 90 tablet 3   No facility-administered medications prior to visit.     ROS: Review of Systems  Constitutional: Positive for fatigue. Negative for appetite change and unexpected weight change.  HENT: Negative for congestion, nosebleeds, sneezing, sore throat and trouble swallowing.   Eyes: Negative for  itching and visual disturbance.  Respiratory: Negative for cough.   Cardiovascular: Negative for chest pain, palpitations and leg swelling.  Gastrointestinal: Negative for abdominal distention, blood in stool, diarrhea and nausea.  Genitourinary: Negative for frequency and hematuria.  Musculoskeletal: Negative for back pain, gait problem, joint swelling and neck pain.  Skin: Negative for rash.  Neurological: Negative for dizziness, tremors, speech difficulty and weakness.  Psychiatric/Behavioral: Positive for dysphoric mood. Negative for agitation, sleep disturbance and suicidal ideas. The patient is not nervous/anxious.     Objective:  BP 122/82 (BP Location: Left Arm, Patient Position: Standing, Cuff Size: Large)    Pulse 64    Temp 98.3 F (36.8 C) (Oral)    Ht 6\' 1"  (1.854 m)    Wt 195 lb (88.5 kg)    SpO2 98%    BMI 25.73 kg/m   BP Readings from Last 3 Encounters:  12/25/18 122/82  12/05/18 134/84  12/04/18 (!) 162/90    Wt Readings from Last 3 Encounters:  12/25/18 195 lb (88.5 kg)  12/05/18 192 lb (87.1 kg)  12/04/18 196 lb (88.9 kg)    Physical Exam Constitutional:      General: He is not in acute distress.    Appearance: He is well-developed.     Comments: NAD  Eyes:     Conjunctiva/sclera: Conjunctivae normal.  Pupils: Pupils are equal, round, and reactive to light.  Neck:     Musculoskeletal: Normal range of motion.     Thyroid: No thyromegaly.     Vascular: No JVD.  Cardiovascular:     Rate and Rhythm: Normal rate and regular rhythm.     Heart sounds: Normal heart sounds. No murmur. No friction rub. No gallop.   Pulmonary:     Effort: Pulmonary effort is normal. No respiratory distress.     Breath sounds: Normal breath sounds. No wheezing or rales.  Chest:     Chest wall: No tenderness.  Abdominal:     General: Bowel sounds are normal. There is no distension.     Palpations: Abdomen is soft. There is no mass.     Tenderness: There is no abdominal  tenderness. There is no guarding or rebound.  Musculoskeletal: Normal range of motion.        General: No tenderness.  Lymphadenopathy:     Cervical: No cervical adenopathy.  Skin:    General: Skin is warm and dry.     Findings: No rash.  Neurological:     Mental Status: He is alert and oriented to person, place, and time.     Cranial Nerves: No cranial nerve deficit.     Motor: No abnormal muscle tone.     Coordination: Coordination normal.     Gait: Gait normal.     Deep Tendon Reflexes: Reflexes are normal and symmetric.  Psychiatric:        Behavior: Behavior normal.        Thought Content: Thought content normal.        Judgment: Judgment normal.   sad, apathetic  AK nose    Procedure Note :     Procedure : Cryosurgery   Indication:  Actinic keratosis(es) nose   Risks including unsuccessful procedure , bleeding, infection, bruising, scar, a need for a repeat  procedure and others were explained to the patient in detail as well as the benefits. Informed consent was obtained verbally.   1 lesion(s)  on his nose   was/were treated with liquid nitrogen on a Q-tip in a usual fasion . Band-Aid was applied and antibiotic ointment was given for a later use.   Tolerated well. Complications none.   Postprocedure instructions :     Keep the wounds clean. You can wash them with liquid soap and water. Pat dry with gauze or a Kleenex tissue  Before applying antibiotic ointment and a Band-Aid.   You need to report immediately  if  any signs of infection develop.     Lab Results  Component Value Date   WBC 9.7 01/25/2018   HGB 12.6 (L) 01/25/2018   HCT 37.9 (L) 01/25/2018   PLT 264.0 01/25/2018   GLUCOSE 97 01/25/2018   CHOL 191 11/17/2018   TRIG 119 11/17/2018   HDL 39 (L) 11/17/2018   LDLDIRECT 123.0 03/25/2014   LDLCALC 130 (H) 11/17/2018   ALT 9 11/17/2018   AST 11 09/29/2018   NA 139 01/25/2018   K 4.6 01/25/2018   CL 109 01/25/2018   CREATININE 2.34 (H)  01/25/2018   BUN 27 (H) 01/25/2018   CO2 22 01/25/2018   TSH 1.58 01/25/2018   PSA 1.73 05/18/2016   INR 1.03 09/13/2017    Vas US Carotid  Result Date: 12/18/2018 Carotid Arterial Duplex Study Indications:       Dizziness. Risk Factors:      Hypertension, hyperlipidemia, coronary artery  disease. Comparison Study:  no prior Performing Technologist: June Leap RDMS, RVT  Examination Guidelines: A complete evaluation includes B-mode imaging, spectral Doppler, color Doppler, and power Doppler as needed of all accessible portions of each vessel. Bilateral testing is considered an integral part of a complete examination. Limited examinations for reoccurring indications may be performed as noted.  Right Carotid Findings: +----------+--------+--------+--------+------------------+--------+             PSV cm/s EDV cm/s Stenosis Plaque Description Comments  +----------+--------+--------+--------+------------------+--------+  CCA Prox   70       9                                              +----------+--------+--------+--------+------------------+--------+  CCA Distal 75       8                                              +----------+--------+--------+--------+------------------+--------+  ICA Prox   92       16       1-39%    heterogenous                 +----------+--------+--------+--------+------------------+--------+  ICA Distal 96       21                                             +----------+--------+--------+--------+------------------+--------+  ECA        93       15                                             +----------+--------+--------+--------+------------------+--------+ +----------+--------+-------+----------------+-------------------+             PSV cm/s EDV cms Describe         Arm Pressure (mmHG)  +----------+--------+-------+----------------+-------------------+  Subclavian 109              Multiphasic, WNL                       +----------+--------+-------+----------------+-------------------+ +---------+--------+--+--------+--+---------+  Vertebral PSV cm/s 74 EDV cm/s 15 Antegrade  +---------+--------+--+--------+--+---------+  Left Carotid Findings: +----------+--------+--------+--------+------------------+--------+             PSV cm/s EDV cm/s Stenosis Plaque Description Comments  +----------+--------+--------+--------+------------------+--------+  CCA Prox   86       10                                             +----------+--------+--------+--------+------------------+--------+  CCA Distal 88       10                                             +----------+--------+--------+--------+------------------+--------+  ICA Prox   85       16  1-39%    heterogenous                 +----------+--------+--------+--------+------------------+--------+  ICA Distal 78       22                                             +----------+--------+--------+--------+------------------+--------+  ECA        94       11                                             +----------+--------+--------+--------+------------------+--------+ +----------+--------+--------+----------------+-------------------+             PSV cm/s EDV cm/s Describe         Arm Pressure (mmHG)  +----------+--------+--------+----------------+-------------------+  Subclavian 121               Multiphasic, WNL                      +----------+--------+--------+----------------+-------------------+ +---------+--------+--+--------+--+---------+  Vertebral PSV cm/s 54 EDV cm/s 14 Antegrade  +---------+--------+--+--------+--+---------+  Summary: Right Carotid: Velocities in the right ICA are consistent with a 1-39% stenosis. Left Carotid: Velocities in the left ICA are consistent with a 1-39% stenosis. Vertebrals:  Bilateral vertebral arteries demonstrate antegrade flow. Subclavians: Normal flow hemodynamics were seen in bilateral subclavian              arteries. *See table(s) above for  measurements and observations.  Electronically signed by Antony Contras MD on 12/18/2018 at 3:17:44 PM.    Final     Assessment & Plan:   There are no diagnoses linked to this encounter.   No orders of the defined types were placed in this encounter.    Follow-up: No follow-ups on file.  Walker Kehr, MD

## 2018-12-25 NOTE — Assessment & Plan Note (Addendum)
Depression with apathy.  He is on Wellbutrin 150 mg daily.  Will add a low-dose of Ritalin 5 mg in the morning.  He will take it with breakfast.  Potential benefits of a long term amphetamines  use as well as potential risks  and complications were explained to the patient and were aknowledged.  Psychiatry/psychology consultations were offered and declined

## 2018-12-25 NOTE — Assessment & Plan Note (Signed)
Stable weight lately

## 2018-12-25 NOTE — Assessment & Plan Note (Signed)
1 lesion on the nose.  See procedure

## 2018-12-27 ENCOUNTER — Other Ambulatory Visit: Payer: Self-pay | Admitting: Neurology

## 2018-12-27 DIAGNOSIS — H26493 Other secondary cataract, bilateral: Secondary | ICD-10-CM | POA: Diagnosis not present

## 2018-12-27 DIAGNOSIS — Z961 Presence of intraocular lens: Secondary | ICD-10-CM | POA: Diagnosis not present

## 2018-12-28 DIAGNOSIS — N400 Enlarged prostate without lower urinary tract symptoms: Secondary | ICD-10-CM | POA: Diagnosis not present

## 2018-12-28 DIAGNOSIS — F329 Major depressive disorder, single episode, unspecified: Secondary | ICD-10-CM | POA: Diagnosis not present

## 2018-12-28 DIAGNOSIS — I129 Hypertensive chronic kidney disease with stage 1 through stage 4 chronic kidney disease, or unspecified chronic kidney disease: Secondary | ICD-10-CM | POA: Diagnosis not present

## 2018-12-28 DIAGNOSIS — M109 Gout, unspecified: Secondary | ICD-10-CM | POA: Diagnosis not present

## 2018-12-28 DIAGNOSIS — E785 Hyperlipidemia, unspecified: Secondary | ICD-10-CM | POA: Diagnosis not present

## 2018-12-28 DIAGNOSIS — E039 Hypothyroidism, unspecified: Secondary | ICD-10-CM | POA: Diagnosis not present

## 2018-12-28 DIAGNOSIS — R319 Hematuria, unspecified: Secondary | ICD-10-CM | POA: Diagnosis not present

## 2018-12-28 DIAGNOSIS — E213 Hyperparathyroidism, unspecified: Secondary | ICD-10-CM | POA: Diagnosis not present

## 2018-12-28 DIAGNOSIS — N184 Chronic kidney disease, stage 4 (severe): Secondary | ICD-10-CM | POA: Diagnosis not present

## 2018-12-28 DIAGNOSIS — D631 Anemia in chronic kidney disease: Secondary | ICD-10-CM | POA: Diagnosis not present

## 2018-12-28 DIAGNOSIS — R809 Proteinuria, unspecified: Secondary | ICD-10-CM | POA: Diagnosis not present

## 2018-12-29 ENCOUNTER — Other Ambulatory Visit: Payer: Self-pay

## 2018-12-29 ENCOUNTER — Ambulatory Visit
Admission: RE | Admit: 2018-12-29 | Discharge: 2018-12-29 | Disposition: A | Discharge status: Home / Self Care | Payer: PPO | Source: Ambulatory Visit | Attending: Neurology | Admitting: Neurology

## 2018-12-29 DIAGNOSIS — R413 Other amnesia: Secondary | ICD-10-CM | POA: Diagnosis not present

## 2018-12-30 ENCOUNTER — Telehealth: Payer: Self-pay | Admitting: Neurology

## 2018-12-30 NOTE — Telephone Encounter (Signed)
I called the patient.  MRI of the brain shows minimal white matter changes, nothing that is causing significant dizziness.  The patient is to get undergo some physical therapy.  Carotid Doppler study was unremarkable.   MRI brain 12/29/18:  IMPRESSION: This MRI of the brain without contrast shows the following: 1.   Moderate generalized cortical atrophy. 2.   Mild chronic microvascular ischemic changes involving the pons and hemispheres 3.   Minimal right mastoid effusion that could be due to eustachian tube dysfunction.   4.   There are no acute findings.   Carotid doppler 12/14/18:  Summary: Right Carotid: Velocities in the right ICA are consistent with a 1-39% stenosis.  Left Carotid: Velocities in the left ICA are consistent with a 1-39% stenosis.  Vertebrals:  Bilateral vertebral arteries demonstrate antegrade flow. Subclavians: Normal flow hemodynamics were seen in bilateral subclavian              arteries.

## 2019-01-01 ENCOUNTER — Other Ambulatory Visit: Payer: PPO

## 2019-01-03 ENCOUNTER — Telehealth: Payer: Self-pay | Admitting: Cardiology

## 2019-01-03 ENCOUNTER — Other Ambulatory Visit: Payer: PPO

## 2019-01-03 NOTE — Telephone Encounter (Signed)
New Message    Pt is calling and is wondering if it is okay he has his labs done tomorrow while he is at his appt at [facility]  And cancel his labs for today    Please call

## 2019-01-03 NOTE — Telephone Encounter (Signed)
Call returned to Pt.  Left detailed message per DPR.  Advised he could get lab work at kidney appt if they will draw what Dr. [name] has ordered.  Advised he needs a fasting cholesterol test and ALT.  Advised if kidney doctor will draw that is fine, have them fax to our office.  No further action needed-await call back with further needs

## 2019-01-04 DIAGNOSIS — E039 Hypothyroidism, unspecified: Secondary | ICD-10-CM | POA: Diagnosis not present

## 2019-01-04 DIAGNOSIS — N184 Chronic kidney disease, stage 4 (severe): Secondary | ICD-10-CM | POA: Diagnosis not present

## 2019-01-04 DIAGNOSIS — R42 Dizziness and giddiness: Secondary | ICD-10-CM | POA: Diagnosis not present

## 2019-01-04 DIAGNOSIS — M109 Gout, unspecified: Secondary | ICD-10-CM | POA: Diagnosis not present

## 2019-01-04 DIAGNOSIS — F329 Major depressive disorder, single episode, unspecified: Secondary | ICD-10-CM | POA: Diagnosis not present

## 2019-01-04 DIAGNOSIS — I129 Hypertensive chronic kidney disease with stage 1 through stage 4 chronic kidney disease, or unspecified chronic kidney disease: Secondary | ICD-10-CM | POA: Diagnosis not present

## 2019-01-04 DIAGNOSIS — R809 Proteinuria, unspecified: Secondary | ICD-10-CM | POA: Diagnosis not present

## 2019-01-04 DIAGNOSIS — E785 Hyperlipidemia, unspecified: Secondary | ICD-10-CM | POA: Diagnosis not present

## 2019-01-05 ENCOUNTER — Telehealth: Payer: Self-pay | Admitting: Cardiology

## 2019-01-05 NOTE — Telephone Encounter (Signed)
Pt c/o of Chest Pain: STAT if CP now or developed within 24 hours  1. Are you having CP right now? No  2. Are you experiencing any other symptoms (ex. SOB, nausea, vomiting, sweating)? Denies any symptoms  3. How long have you been experiencing CP? Started at 4am this morning and lasted for about 30 mins. Says it was more pressure than chest pain.   4. Is your CP continuous or coming and going? Coming and going  5. Have you taken Nitroglycerin? No ?

## 2019-01-10 DIAGNOSIS — R8279 Other abnormal findings on microbiological examination of urine: Secondary | ICD-10-CM | POA: Diagnosis not present

## 2019-01-10 DIAGNOSIS — N453 Epididymo-orchitis: Secondary | ICD-10-CM | POA: Diagnosis not present

## 2019-01-10 DIAGNOSIS — N400 Enlarged prostate without lower urinary tract symptoms: Secondary | ICD-10-CM | POA: Diagnosis not present

## 2019-01-17 ENCOUNTER — Other Ambulatory Visit: Payer: Self-pay

## 2019-01-22 DIAGNOSIS — N453 Epididymo-orchitis: Secondary | ICD-10-CM | POA: Diagnosis not present

## 2019-01-22 DIAGNOSIS — N400 Enlarged prostate without lower urinary tract symptoms: Secondary | ICD-10-CM | POA: Diagnosis not present

## 2019-01-22 DIAGNOSIS — R8279 Other abnormal findings on microbiological examination of urine: Secondary | ICD-10-CM | POA: Diagnosis not present

## 2019-01-23 ENCOUNTER — Ambulatory Visit: Payer: PPO | Admitting: Internal Medicine

## 2019-01-23 DIAGNOSIS — N3000 Acute cystitis without hematuria: Secondary | ICD-10-CM | POA: Diagnosis not present

## 2019-01-23 DIAGNOSIS — N390 Urinary tract infection, site not specified: Secondary | ICD-10-CM | POA: Diagnosis not present

## 2019-01-23 DIAGNOSIS — Z87440 Personal history of urinary (tract) infections: Secondary | ICD-10-CM | POA: Diagnosis not present

## 2019-02-06 DIAGNOSIS — I129 Hypertensive chronic kidney disease with stage 1 through stage 4 chronic kidney disease, or unspecified chronic kidney disease: Secondary | ICD-10-CM | POA: Diagnosis not present

## 2019-02-06 DIAGNOSIS — N184 Chronic kidney disease, stage 4 (severe): Secondary | ICD-10-CM | POA: Diagnosis not present

## 2019-02-06 DIAGNOSIS — E213 Hyperparathyroidism, unspecified: Secondary | ICD-10-CM | POA: Diagnosis not present

## 2019-02-06 DIAGNOSIS — E039 Hypothyroidism, unspecified: Secondary | ICD-10-CM | POA: Diagnosis not present

## 2019-02-06 DIAGNOSIS — M109 Gout, unspecified: Secondary | ICD-10-CM | POA: Diagnosis not present

## 2019-02-06 DIAGNOSIS — D631 Anemia in chronic kidney disease: Secondary | ICD-10-CM | POA: Diagnosis not present

## 2019-02-06 DIAGNOSIS — F329 Major depressive disorder, single episode, unspecified: Secondary | ICD-10-CM | POA: Diagnosis not present

## 2019-02-06 DIAGNOSIS — H9110 Presbycusis, unspecified ear: Secondary | ICD-10-CM | POA: Diagnosis not present

## 2019-02-12 ENCOUNTER — Other Ambulatory Visit: Payer: Self-pay | Admitting: Internal Medicine

## 2019-02-12 DIAGNOSIS — N430 Encysted hydrocele: Secondary | ICD-10-CM | POA: Diagnosis not present

## 2019-02-12 DIAGNOSIS — N453 Epididymo-orchitis: Secondary | ICD-10-CM | POA: Diagnosis not present

## 2019-02-21 ENCOUNTER — Other Ambulatory Visit: Payer: Self-pay | Admitting: Internal Medicine

## 2019-02-21 MED ORDER — LEVOTHYROXINE SODIUM 88 MCG PO TABS: 88.0000 ug | ORAL_TABLET | Freq: Every day | ORAL | 0 refills | Status: DC

## 2019-02-21 NOTE — Telephone Encounter (Signed)
Copied from [place] [id number]. Topic: Quick Communication - Rx Refill/Question >> Feb 21, 2019 10:48 AM Leward Quan A wrote: Medication: levothyroxine (SYNTHROID, LEVOTHROID) 88 MCG tablet   Has the patient contacted their pharmacy? Yes.   (Agent: If no, request that the patient contact the pharmacy for the refill.) (Agent: If yes, when and what did the pharmacy advise?)  Preferred Pharmacy (with phone number or street name): [id number] - [place], [place]  Phone:  [phone] Fax:  [phone]     Agent: Please be advised that RX refills may take up to 3 business days. We ask that you follow-up with your pharmacy.

## 2019-02-28 ENCOUNTER — Other Ambulatory Visit: Payer: PPO | Admitting: *Deleted

## 2019-02-28 ENCOUNTER — Other Ambulatory Visit: Payer: Self-pay

## 2019-02-28 DIAGNOSIS — E785 Hyperlipidemia, unspecified: Secondary | ICD-10-CM | POA: Diagnosis not present

## 2019-02-28 LAB — LIPID PANEL
Chol/HDL Ratio: 3.3 ratio (ref 0.0–5.0)
Cholesterol, Total: 160 mg/dL (ref 100–199)
HDL: 48 mg/dL (ref >39)
LDL Chol Calc (NIH): 91 mg/dL (ref 0–99)
Triglycerides: 116 mg/dL (ref 0–149)
VLDL Cholesterol Cal: 21 mg/dL (ref 5–40)

## 2019-03-07 ENCOUNTER — Telehealth: Payer: Self-pay

## 2019-03-07 DIAGNOSIS — E785 Hyperlipidemia, unspecified: Secondary | ICD-10-CM

## 2019-03-07 MED ORDER — EZETIMIBE 10 MG PO TABS: 10.0000 mg | ORAL_TABLET | Freq: Every day | ORAL | 3 refills | Status: DC

## 2019-03-07 NOTE — Telephone Encounter (Signed)
-----   Message from Sueanne Margarita, MD sent at 03/06/2019  7:44 PM EST ----- Please add Zetia 10mg  daily and repeat FLP and ALT in 6 weeks

## 2019-03-23 DIAGNOSIS — N453 Epididymo-orchitis: Secondary | ICD-10-CM | POA: Diagnosis not present

## 2019-03-23 DIAGNOSIS — N430 Encysted hydrocele: Secondary | ICD-10-CM | POA: Diagnosis not present

## 2019-03-23 DIAGNOSIS — I129 Hypertensive chronic kidney disease with stage 1 through stage 4 chronic kidney disease, or unspecified chronic kidney disease: Secondary | ICD-10-CM | POA: Diagnosis not present

## 2019-03-28 ENCOUNTER — Ambulatory Visit (INDEPENDENT_AMBULATORY_CARE_PROVIDER_SITE_OTHER): Payer: PPO | Admitting: Internal Medicine

## 2019-03-28 ENCOUNTER — Encounter: Payer: Self-pay | Admitting: Internal Medicine

## 2019-03-28 ENCOUNTER — Other Ambulatory Visit: Payer: Self-pay

## 2019-03-28 DIAGNOSIS — I2510 Atherosclerotic heart disease of native coronary artery without angina pectoris: Secondary | ICD-10-CM

## 2019-03-28 DIAGNOSIS — F332 Major depressive disorder, recurrent severe without psychotic features: Secondary | ICD-10-CM

## 2019-03-28 MED ORDER — CLONIDINE HCL 0.1 MG PO TABS: 0.1000 mg | ORAL_TABLET | Freq: Three times a day (TID) | ORAL | 3 refills | Status: DC | PRN

## 2019-03-28 NOTE — Progress Notes (Signed)
Subjective:  Patient ID: Kenneth Williams, male    DOB: 01/15/1936  Age: 84 y.o. MRN: [id number]  CC: No chief complaint on file.   HPI Kenneth Williams presents for neck pain, depression, hypothyroidism f/u  Outpatient Medications Prior to Visit  Medication Sig Dispense Refill   acetaminophen (TYLENOL) 500 MG tablet Take 500 mg by mouth 2 (two) times daily as needed for mild pain.     aspirin EC 81 MG tablet Take 1 tablet (81 mg total) by mouth daily. 100 tablet 3   buPROPion (WELLBUTRIN SR) 150 MG 12 hr tablet TAKE 1 TABLET BY MOUTH EVERY DAY 90 tablet 2   cholecalciferol (VITAMIN D) 1000 UNITS tablet Take 1,000 Units by mouth daily.      colchicine 0.6 MG tablet TAKE 1 TABLET (0.6 MG TOTAL) BY MOUTH 3 (THREE) TIMES DAILY AS NEEDED. FOR GOUT 60 tablet 2   Cyanocobalamin (VITAMIN B 12 PO) Take 1 tablet by mouth daily.       ezetimibe (ZETIA) 10 MG tablet Take 1 tablet (10 mg total) by mouth daily. 90 tablet 3   fluticasone (FLONASE) 50 MCG/ACT nasal spray Place 2 sprays into both nostrils daily. 16 g 6   labetalol (NORMODYNE) 300 MG tablet Take 1 tablet (300 mg total) by mouth 3 (three) times daily. (Patient taking differently: Take 300 mg by mouth 2 (two) times daily. ) 90 tablet 11   levothyroxine (SYNTHROID) 88 MCG tablet Take 1 tablet (88 mcg total) by mouth daily. 90 tablet 0   methylphenidate (RITALIN) 5 MG tablet Take 1 tablet (5 mg total) by mouth daily after breakfast. 30 tablet 0   polyethylene glycol (MIRALAX / GLYCOLAX) packet Take 17 g by mouth daily as needed for mild constipation. 14 each 0   rosuvastatin (CRESTOR) 40 MG tablet Take 1 tablet (40 mg total) by mouth daily. 90 tablet 3   No facility-administered medications prior to visit.    ROS: Review of Systems  Constitutional: Positive for fatigue. Negative for appetite change and unexpected weight change.  HENT: Negative for congestion, nosebleeds, sneezing, sore throat and trouble swallowing.   Eyes:  Negative for itching and visual disturbance.  Respiratory: Negative for cough.   Cardiovascular: Negative for chest pain, palpitations and leg swelling.  Gastrointestinal: Negative for abdominal distention, blood in stool, diarrhea and nausea.  Genitourinary: Negative for frequency and hematuria.  Musculoskeletal: Positive for arthralgias and neck pain. Negative for back pain, gait problem and joint swelling.  Skin: Negative for rash.  Neurological: Negative for dizziness, tremors, speech difficulty and weakness.  Psychiatric/Behavioral: Negative for agitation, dysphoric mood, sleep disturbance and suicidal ideas. The patient is not nervous/anxious.     Objective:  BP (!) 150/74 (BP Location: Left Arm, Patient Position: Sitting, Cuff Size: Normal)    Pulse 68    Temp 98 F (36.7 C) (Oral)    Ht 6\' 1"  (1.854 m)    Wt 198 lb (89.8 kg)    SpO2 98%    BMI 26.12 kg/m   BP Readings from Last 3 Encounters:  03/28/19 (!) 150/74  12/25/18 122/82  12/05/18 134/84    Wt Readings from Last 3 Encounters:  03/28/19 198 lb (89.8 kg)  12/25/18 195 lb (88.5 kg)  12/05/18 192 lb (87.1 kg)    Physical Exam Constitutional:      General: He is not in acute distress.    Appearance: He is well-developed.     Comments: NAD  Eyes:  Conjunctiva/sclera: Conjunctivae normal.     Pupils: Pupils are equal, round, and reactive to light.  Neck:     Thyroid: No thyromegaly.     Vascular: No JVD.  Cardiovascular:     Rate and Rhythm: Normal rate and regular rhythm.     Heart sounds: Normal heart sounds. No murmur. No friction rub. No gallop.   Pulmonary:     Effort: Pulmonary effort is normal. No respiratory distress.     Breath sounds: Normal breath sounds. No wheezing or rales.  Chest:     Chest wall: No tenderness.  Abdominal:     General: Bowel sounds are normal. There is no distension.     Palpations: Abdomen is soft. There is no mass.     Tenderness: There is no abdominal tenderness. There  is no guarding or rebound.  Musculoskeletal:        General: Tenderness present. Normal range of motion.     Cervical back: Normal range of motion.  Lymphadenopathy:     Cervical: No cervical adenopathy.  Skin:    General: Skin is warm and dry.     Findings: No rash.  Neurological:     Mental Status: He is alert and oriented to person, place, and time.     Cranial Nerves: No cranial nerve deficit.     Motor: No abnormal muscle tone.     Coordination: Coordination normal.     Gait: Gait normal.     Deep Tendon Reflexes: Reflexes are normal and symmetric.  Psychiatric:        Behavior: Behavior normal.        Thought Content: Thought content normal.        Judgment: Judgment normal.     Lab Results  Component Value Date   WBC 9.7 01/25/2018   HGB 12.6 (L) 01/25/2018   HCT 37.9 (L) 01/25/2018   PLT 264.0 01/25/2018   GLUCOSE 97 01/25/2018   CHOL 160 02/28/2019   TRIG 116 02/28/2019   HDL 48 02/28/2019   LDLDIRECT 123.0 03/25/2014   LDLCALC 91 02/28/2019   ALT 9 11/17/2018   AST 11 09/29/2018   NA 139 01/25/2018   K 4.6 01/25/2018   CL 109 01/25/2018   CREATININE 2.34 (H) 01/25/2018   BUN 27 (H) 01/25/2018   CO2 22 01/25/2018   TSH 1.58 01/25/2018   PSA 1.73 05/18/2016   INR 1.03 09/13/2017    MR BRAIN WO CONTRAST  Result Date: 12/29/2018  [facility] [address] [place], [place] [address] [id number] NEUROIMAGING REPORT STUDY DATE: Mar 19, 1935 PATIENT NAME: JONMARC BODKIN DOB: 08-Jun-1935 MRN: [id number] EXAM: MRI Brain without contrast ORDERING CLINICIAN: Kathrynn Ducking, MD CLINICAL HISTORY: 84 year old man with memory loss COMPARISON FILMS: MRI 02/27/2004. TECHNIQUE: MRI of the brain without contrast was obtained utilizing 5 mm axial slices with T1, T2, T2 flair, SWI and diffusion weighted views.  T1 sagittal and T2 coronal views were obtained. CONTRAST: none IMAGING SITE: [facility], [place], [place] FINDINGS: On sagittal  images, the spinal cord is imaged caudally to C3 and is normal in caliber.   The contents of the posterior fossa are of normal size and position.   The pituitary gland and optic chiasm appear normal.    There is moderate generalized cortical atrophy, progressed compared to the MRI from 2006.  There are no abnormal extra-axial collections of fluid.  There are T2/flair hyperintense foci in the pons and in the hemispheres, predominantly in  the deep white matter.  None of these appear to be acute.  This has progressed compared to the 2006 MRI.  The deep gray matter appears normal.  Diffusion weighted images are normal.  Susceptibility weighted images are normal.  The VIIth/VIIIth nerve complex appears normal.  There is a mild right mastoid effusion.  The left mastoid air cells appear normal.  Mucous retention cyst in the posterior ethmoid air cell on the right.  The other paranasal sinuses appear normal.  There have been bilateral cataract extractions since 2006.  The orbits are otherwise normal.  Flow voids are identified within the major intracerebral arteries.     This MRI of the brain without contrast shows the following: 1.   Moderate generalized cortical atrophy. 2.   Mild chronic microvascular ischemic changes involving the pons and hemispheres 3.   Minimal right mastoid effusion that could be due to eustachian tube dysfunction.  4.   There are no acute findings. INTERPRETING PHYSICIAN: Richard A. Felecia Shelling, MD, PhD, FAAN Certified in  Neuroimaging by [employer]    Assessment & Plan:    Walker Kehr, MD

## 2019-03-28 NOTE — Assessment & Plan Note (Signed)
No angina Pravastatin, ASA

## 2019-03-28 NOTE — Assessment & Plan Note (Signed)
He nevere took Ritalin 5 mg in the morning for apathy - pt declined.Marland KitchenMarland Kitchen

## 2019-04-05 ENCOUNTER — Ambulatory Visit: Payer: PPO | Attending: Internal Medicine

## 2019-04-05 DIAGNOSIS — Z23 Encounter for immunization: Secondary | ICD-10-CM | POA: Insufficient documentation

## 2019-04-05 NOTE — Progress Notes (Signed)
   Covid-19 Vaccination Clinic  Name:  Kenneth Williams    MRN: [id number] DOB: 04-03-1935  04/05/2019  [name] was observed post Covid-19 immunization for 15 minutes without incidence. He was provided with Vaccine Information Sheet and instruction to access the V-Safe system.   [name] was instructed to call 911 with any severe reactions post vaccine: Marland Kitchen Difficulty breathing  . Swelling of your face and throat  . A fast heartbeat  . A bad rash all over your body  . Dizziness and weakness    Immunizations Administered    Name Date Dose VIS Date Route   Pfizer COVID-19 Vaccine 04/05/2019  4:54 PM 0.3 mL 02/02/2019 Intramuscular   Manufacturer: [place]   Lot: [id number]   [place]: [id number]

## 2019-04-10 ENCOUNTER — Ambulatory Visit: Payer: Self-pay | Admitting: Neurology

## 2019-04-18 ENCOUNTER — Other Ambulatory Visit: Payer: Self-pay

## 2019-04-18 ENCOUNTER — Other Ambulatory Visit: Payer: PPO | Admitting: *Deleted

## 2019-04-18 DIAGNOSIS — E785 Hyperlipidemia, unspecified: Secondary | ICD-10-CM | POA: Diagnosis not present

## 2019-04-18 LAB — LIPID PANEL
Chol/HDL Ratio: 2.4 ratio (ref 0.0–5.0)
Cholesterol, Total: 112 mg/dL (ref 100–199)
HDL: 47 mg/dL (ref >39)
LDL Chol Calc (NIH): 45 mg/dL (ref 0–99)
Triglycerides: 111 mg/dL (ref 0–149)
VLDL Cholesterol Cal: 20 mg/dL (ref 5–40)

## 2019-04-18 LAB — ALT: ALT: 28 IU/L (ref 0–44)

## 2019-04-19 IMAGING — DX DG CHEST 2V
2 series · 2 of 2 positions shown · non-contrast
Comparison: Chest x-rays dated 02/06/2016 and 09/15/2015.

CLINICAL DATA: Medial chest pain.  Shortness of breath.

EXAM:
CHEST - 2 VIEW

[chest lat]
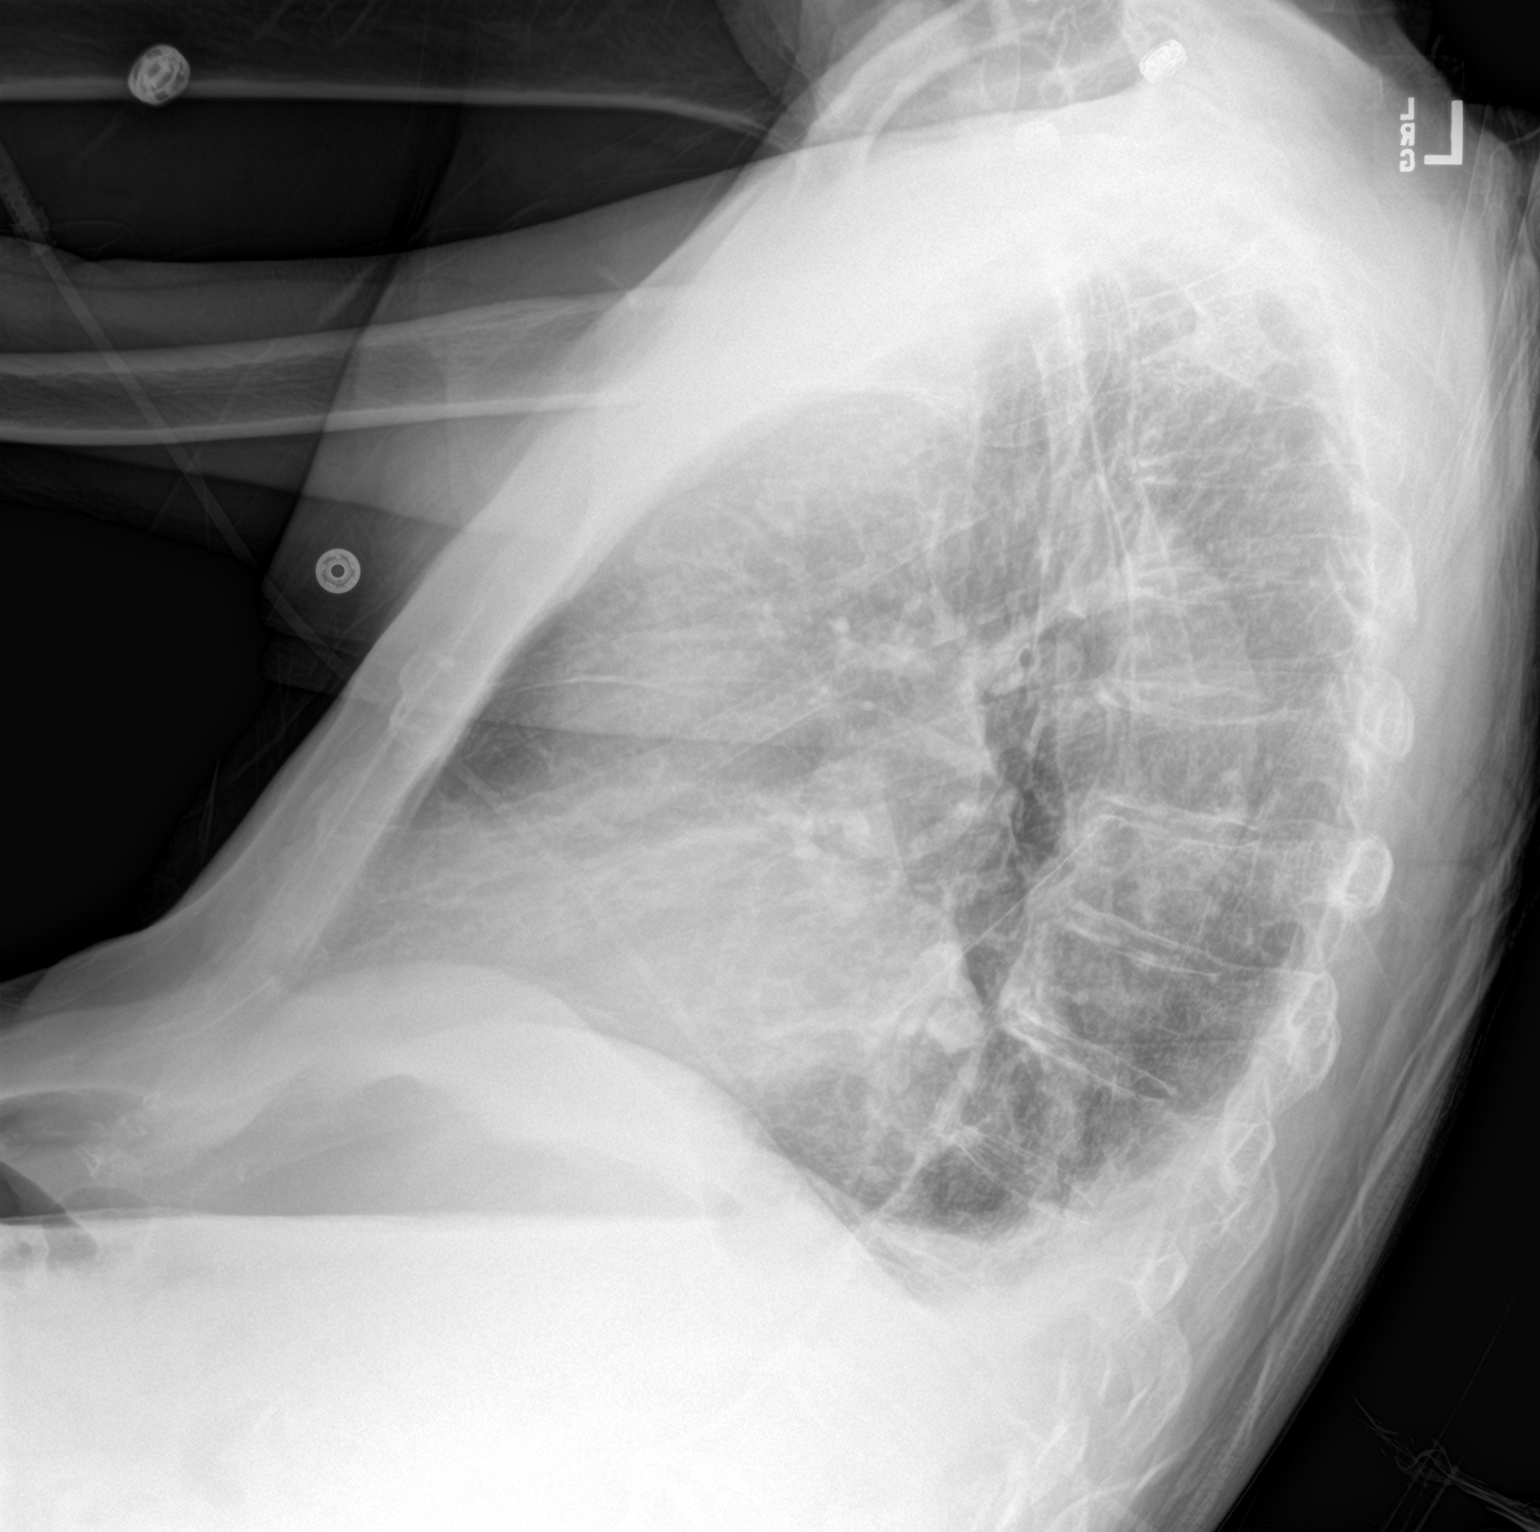

[chest ap]
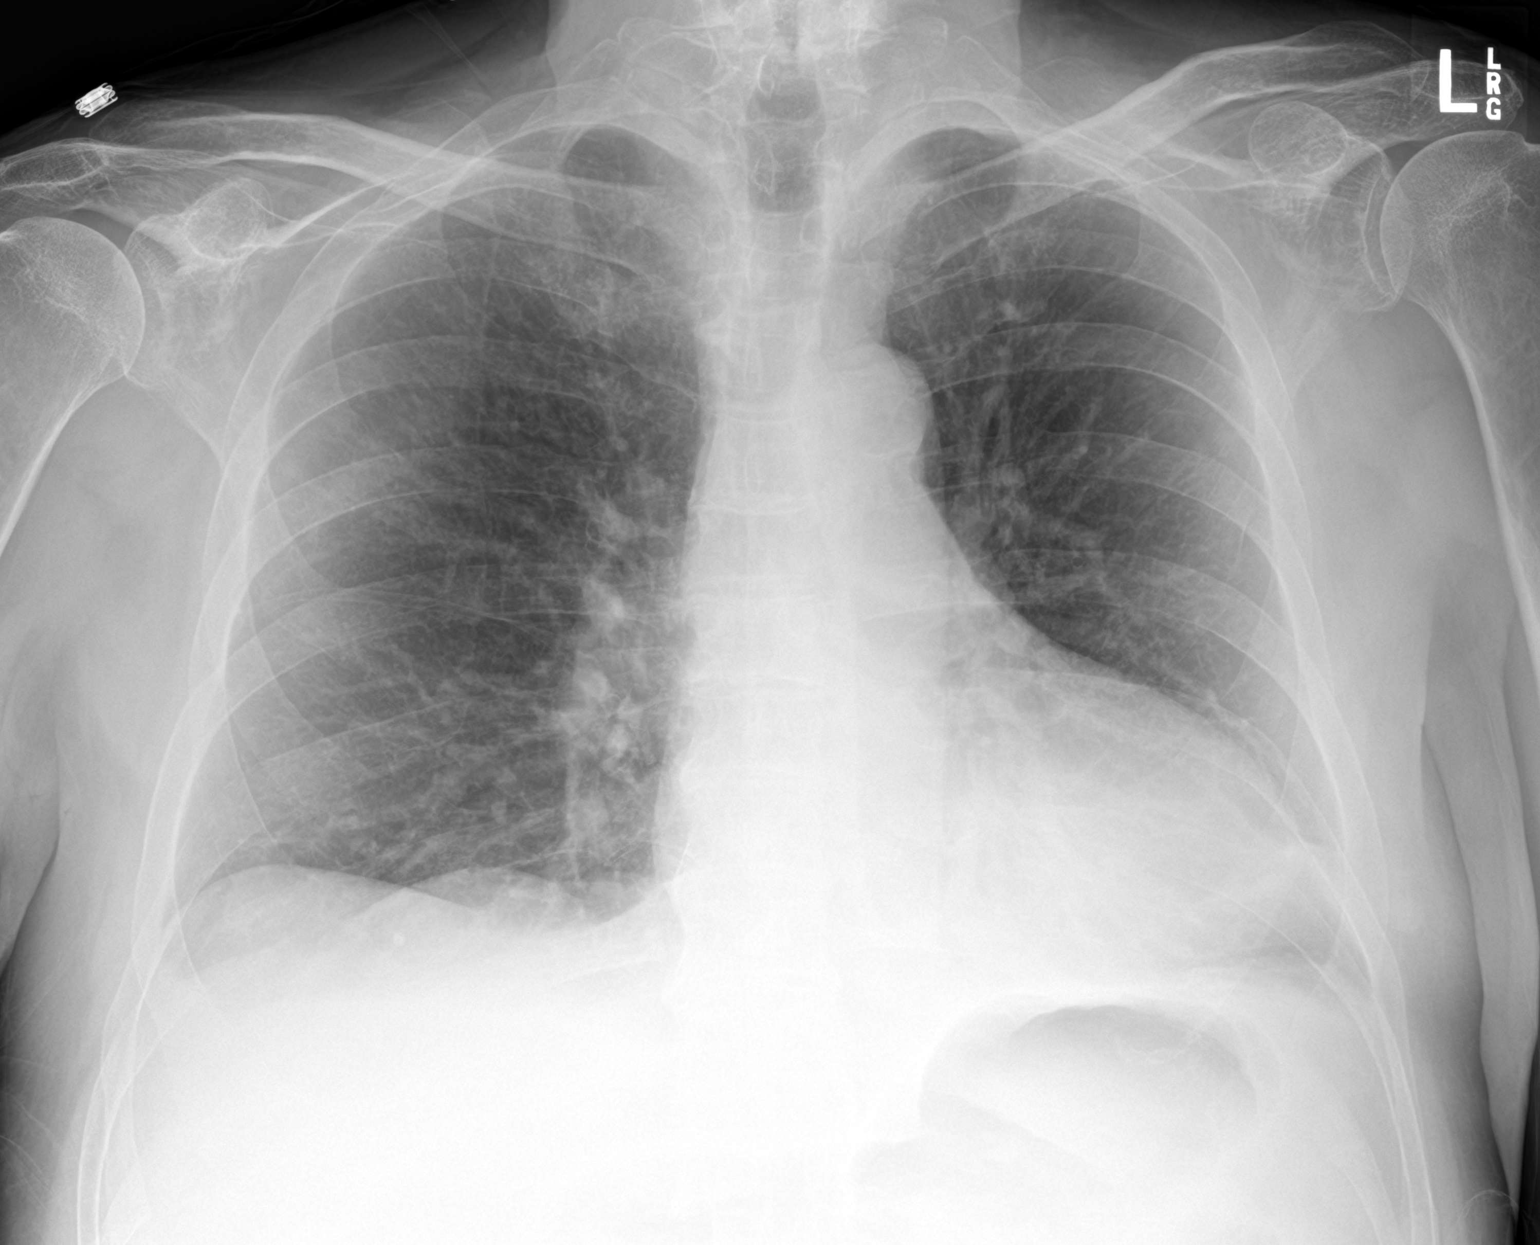

[2 of 2 positions shown; findings below may reference images not displayed]

FINDINGS: Today's study is slightly hypoinspiratory with crowding of the
perihilar and bibasilar bronchovascular markings. New subtle opacity
is seen within the periphery of the RIGHT upper lung, suspicious for
developing pneumonia. There is also a new dense opacity at the
posterior costophrenic angle on the lateral projection, most likely
LEFT lower lobe, consistent with additional pneumonia, atelectasis
and/or small pleural effusion. Questionable small RIGHT pleural
effusion.

No pneumothorax or large pleural effusion seen. Heart size and
mediastinal contours are likely stable given the low lung volumes.
IMPRESSION: 1. Subtle opacity within the RIGHT upper lung, suspicious for early
developing pneumonia. Recommend follow-up chest x-ray to ensure
resolution.
2. Additional dense opacity at the posterior costophrenic angle on
the lateral view, most likely LEFT lower lobe, consistent with
pneumonia, atelectasis and/or small pleural effusion.
3. Questionable small RIGHT pleural effusion.

## 2019-04-21 IMAGING — XA IR FLUORO GUIDE CV LINE*R*
1 series · 1 of 1 positions shown · non-contrast
Comparison: none

CLINICAL DATA: Emphysematous cystitis, E coli bacteremia and need
for tunneled central venous catheter for IV antibiotic therapy. Need
to preserve upper extremity veins due to underlying significant
chronic kidney disease.

[Series 300: line placements · 1 of 1 slices shown]
[im 1/1]
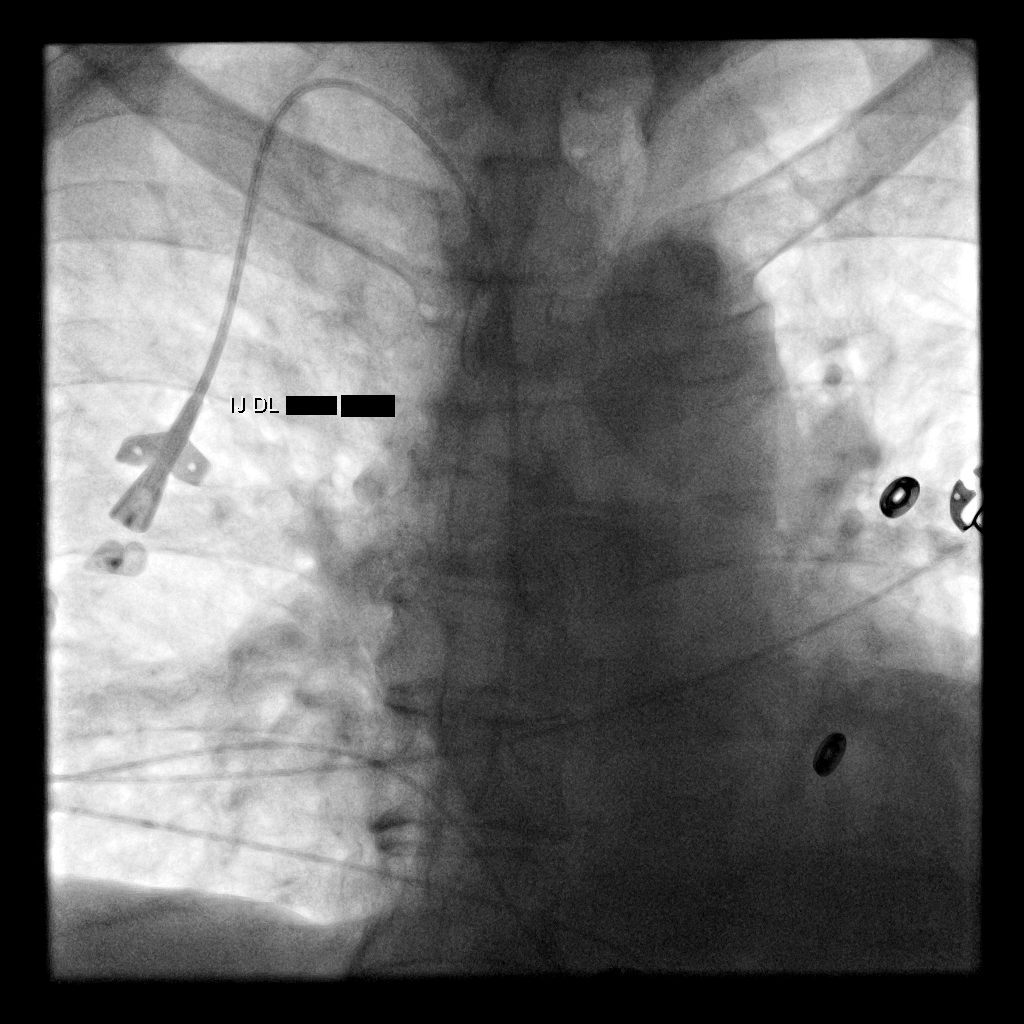

[1 of 1 positions shown; findings below may reference images not displayed]

EXAM:
TUNNELED CENTRAL VENOUS CATHETER PLACEMENT WITH ULTRASOUND AND
FLUOROSCOPIC GUIDANCE

ANESTHESIA/SEDATION:
1.0 mg IV Versed; 50 mcg IV Fentanyl.

Total Moderate Sedation Time:   13 minutes.

The patient's level of consciousness and physiologic status were
continuously monitored during the procedure by Radiology nursing.

MEDICATIONS:
None

FLUOROSCOPY TIME:  6 seconds.  1.3 mGy.

PROCEDURE:
The procedure, risks, benefits, and alternatives were explained to
the patient. Questions regarding the procedure were encouraged and
answered. The patient understands and consents to the procedure. A
timeout was performed prior to initiating the procedure.

Ultrasound was utilized to confirm patency of the right internal
jugular vein. The right neck and chest were prepped with
chlorhexidine in a sterile fashion, and a sterile drape was applied
covering the operative field. Maximum barrier sterile technique with
sterile gowns and gloves were used for the procedure. Local
anesthesia was provided with 1% lidocaine.

After creating a small venotomy incision, a 21 gauge needle was
advanced into the right internal jugular vein under direct,
real-time ultrasound guidance. Ultrasound image documentation was
performed. After securing guidewire access, 6 French peel-away
sheath was placed. A wire was utilized to estimate catheter length.

A 6 French, dual-lumen power line tunneled catheter was chosen for
placement. This was tunneled in a retrograde fashion from the chest
wall to the venotomy incision. The catheter was cut to 26 cm based
on guidewire measurement. The catheter was placed through the
peel-away sheath and the sheath removed.

The venotomy incision was closed with subcutaneous subcuticular 4-0
Vicryl. Dermabond was applied to the incision. The catheter exit
site was secured Prolene and Ethilon retention sutures.

COMPLICATIONS:
None.  No pneumothorax.
FINDINGS: After catheter placement, the tip lies at the SVC/RA junction. The
catheter aspirates normally and is ready for immediate use.
IMPRESSION: Placement of tunneled central venous catheter via the right internal
jugular vein. The catheter tip lies at the SVC/RA junction. The
catheter is ready for immediate use.

## 2019-04-25 DIAGNOSIS — F329 Major depressive disorder, single episode, unspecified: Secondary | ICD-10-CM | POA: Diagnosis not present

## 2019-04-25 DIAGNOSIS — N184 Chronic kidney disease, stage 4 (severe): Secondary | ICD-10-CM | POA: Diagnosis not present

## 2019-04-25 DIAGNOSIS — H9110 Presbycusis, unspecified ear: Secondary | ICD-10-CM | POA: Diagnosis not present

## 2019-04-25 DIAGNOSIS — D631 Anemia in chronic kidney disease: Secondary | ICD-10-CM | POA: Diagnosis not present

## 2019-04-25 DIAGNOSIS — M109 Gout, unspecified: Secondary | ICD-10-CM | POA: Diagnosis not present

## 2019-04-25 DIAGNOSIS — E039 Hypothyroidism, unspecified: Secondary | ICD-10-CM | POA: Diagnosis not present

## 2019-04-25 DIAGNOSIS — I129 Hypertensive chronic kidney disease with stage 1 through stage 4 chronic kidney disease, or unspecified chronic kidney disease: Secondary | ICD-10-CM | POA: Diagnosis not present

## 2019-04-25 DIAGNOSIS — E213 Hyperparathyroidism, unspecified: Secondary | ICD-10-CM | POA: Diagnosis not present

## 2019-04-28 ENCOUNTER — Ambulatory Visit: Payer: PPO | Attending: Internal Medicine

## 2019-04-28 DIAGNOSIS — Z23 Encounter for immunization: Secondary | ICD-10-CM | POA: Insufficient documentation

## 2019-04-28 NOTE — Progress Notes (Signed)
   Covid-19 Vaccination Clinic  Name:  Kenneth Williams    MRN: [id number] DOB: April 09, 1935  04/28/2019  Kenneth Williams was observed post Covid-19 immunization for 15 minutes without incident. He was provided with Vaccine Information Sheet and instruction to access the V-Safe system.   Kenneth Williams was instructed to call 911 with any severe reactions post vaccine: Marland Kitchen Difficulty breathing  . Swelling of face and throat  . A fast heartbeat  . A bad rash all over body  . Dizziness and weakness   Immunizations Administered    Name Date Dose VIS Date Route   Pfizer COVID-19 Vaccine 04/28/2019  2:15 PM 0.3 mL 02/02/2019 Intramuscular   Manufacturer: [place]   Lot: [id number]   [place]: [address]-6

## 2019-05-04 ENCOUNTER — Other Ambulatory Visit: Payer: Self-pay

## 2019-05-04 MED ORDER — LABETALOL HCL 300 MG PO TABS: 300.0000 mg | ORAL_TABLET | Freq: Two times a day (BID) | ORAL | 11 refills | Status: DC

## 2019-05-27 ENCOUNTER — Other Ambulatory Visit: Payer: Self-pay | Admitting: Internal Medicine

## 2019-05-30 ENCOUNTER — Telehealth: Payer: Self-pay | Admitting: Internal Medicine

## 2019-05-30 ENCOUNTER — Other Ambulatory Visit: Payer: Self-pay | Admitting: Internal Medicine

## 2019-05-30 MED ORDER — ERYTHROMYCIN 5 MG/GM OP OINT: 1.0000 "application " | TOPICAL_OINTMENT | Freq: Every day | OPHTHALMIC | 1 refills | Status: AC

## 2019-05-30 NOTE — Telephone Encounter (Signed)
Needs eye oint

## 2019-06-12 ENCOUNTER — Encounter: Payer: Self-pay | Admitting: Cardiology

## 2019-06-12 ENCOUNTER — Other Ambulatory Visit: Payer: Self-pay

## 2019-06-12 ENCOUNTER — Ambulatory Visit: Payer: PPO | Admitting: Cardiology

## 2019-06-12 VITALS — BP 148/88 | HR 72 | Ht 73.0 in | Wt 189.2 lb

## 2019-06-12 DIAGNOSIS — M542 Cervicalgia: Secondary | ICD-10-CM

## 2019-06-12 DIAGNOSIS — I6523 Occlusion and stenosis of bilateral carotid arteries: Secondary | ICD-10-CM | POA: Diagnosis not present

## 2019-06-12 DIAGNOSIS — I2510 Atherosclerotic heart disease of native coronary artery without angina pectoris: Secondary | ICD-10-CM | POA: Diagnosis not present

## 2019-06-12 DIAGNOSIS — E785 Hyperlipidemia, unspecified: Secondary | ICD-10-CM | POA: Diagnosis not present

## 2019-06-12 DIAGNOSIS — R0602 Shortness of breath: Secondary | ICD-10-CM | POA: Diagnosis not present

## 2019-06-12 DIAGNOSIS — I951 Orthostatic hypotension: Secondary | ICD-10-CM | POA: Diagnosis not present

## 2019-06-12 DIAGNOSIS — I10 Essential (primary) hypertension: Secondary | ICD-10-CM | POA: Diagnosis not present

## 2019-06-12 NOTE — Patient Instructions (Signed)
Medication Instructions:  Your physician recommends that you continue on your current medications as directed. Please refer to the Current Medication list given to you today.  *If you need a refill on your cardiac medications before your next appointment, please call your pharmacy*  Follow-Up: At [facility], you and your health needs are our priority.  As part of our continuing mission to provide you with exceptional heart care, we have created designated Provider Care Teams.  These Care Teams include your primary Cardiologist (physician) and Advanced Practice Providers (APPs -  Physician Assistants and Nurse Practitioners) who all work together to provide you with the care you need, when you need it.  Your next appointment:   2 month(s)  The format for your next appointment:   In Person  Provider:   Melina Copa, PA-C or Ermalinda Barrios, PA-C   Other Instructions You have been referred to go back and see Dr. Jannifer Franklin in Neurology.

## 2019-06-12 NOTE — Progress Notes (Addendum)
Cardiology Office Note:    Date:  06/12/2019   ID:  Kenneth Williams, DOB 06/03/1935, MRN [id number]  PCP:  Cassandria Anger, MD  Cardiologist:  Fransico Him, MD    Referring MD: Cassandria Anger, MD   Chief Complaint  Patient presents with  . Coronary Artery Disease  . Hypertension  . Shortness of Breath  . Hyperlipidemia    History of Present Illness:    Kenneth Williams is a 84 y.o. male with a hx of CAD with nonobstructive CAD by cath in 2011, hyperlipidemia, hypertension, orthostatic hypotension and elevated calcium score of 727. Due to DOE a 2D echo was done showing normal LVF with G1 DD.  Nuclear stress test showed no ischemia.    He is here today for followup and is doing well.  He denies any chest pain or pressure, SOB, DOE, PND, orthopnea, LE edema,palpitations or syncope. He continues to have problems with orthostatic hypotension although his main complaint is that he is losing muscle mass, weight and that his shoulder and neck muscles seem to be getting weaker.  He is compliant with his meds and is tolerating meds with no SE.    Past Medical History:  Diagnosis Date  . BPH (benign prostatic hyperplasia)   . CAD (coronary artery disease)    cath 2011-mild non obstructive, coronary artery calcium score of 727 on CT 04/13/2018  . Dyspnea    with activity  . Elevated PSA    history of   . Gout    2009  . History of diverticulitis of colon   . History of pancreatitis   . History of TIAs   . Hyperlipidemia   . Hypertension   . Left ear hearing loss    wears right hearing aid   without hearing aides deaf  . Personal history of hyperthyroidism 2006   s/p 131I- Dr. Chalmers Cater, Hyperthyroid  . Renal insufficiency 2011  . TGA (transient global amnesia)     Past Surgical History:  Procedure Laterality Date  . CHOLECYSTECTOMY    . COLONOSCOPY    . IR FLUORO GUIDE CV LINE RIGHT  09/14/2017  . IR REMOVAL TUN CV CATH W/O FL  09/20/2017  . IR US GUIDE VASC ACCESS RIGHT   09/14/2017  . right ear  surgery     BAHA bone anchored  with screw  . STAPEDES SURGERY     LEFT EAR  . TRANSURETHRAL RESECTION OF PROSTATE    . TRANSURETHRAL RESECTION OF PROSTATE N/A 11/04/2017   Procedure: TRANSURETHRAL RESECTION OF THE PROSTATE (TURP);  Surgeon: Franchot Gallo, MD;  Location: [facility];  Service: Urology;  Laterality: N/A;  1 HR    Current Medications: Current Meds  Medication Sig  . acetaminophen (TYLENOL) 500 MG tablet Take 500 mg by mouth 2 (two) times daily as needed for mild pain.  Marland Kitchen buPROPion (WELLBUTRIN SR) 150 MG 12 hr tablet TAKE 1 TABLET BY MOUTH EVERY DAY  . cholecalciferol (VITAMIN D) 1000 UNITS tablet Take 1,000 Units by mouth daily.   . cloNIDine (CATAPRES) 0.1 MG tablet Take 1 tablet (0.1 mg total) by mouth 3 (three) times daily as needed (take for systolic BP greater than 301).  . colchicine 0.6 MG tablet TAKE 1 TABLET (0.6 MG TOTAL) BY MOUTH 3 (THREE) TIMES DAILY AS NEEDED. FOR GOUT  . Cyanocobalamin (VITAMIN B 12 PO) Take 1 tablet by mouth daily.    Marland Kitchen erythromycin ophthalmic ointment Place 1 application into both eyes at bedtime  for 14 days.  Marland Kitchen ezetimibe (ZETIA) 10 MG tablet Take 1 tablet (10 mg total) by mouth daily.  . fluticasone (FLONASE) 50 MCG/ACT nasal spray Place 2 sprays into both nostrils daily.  Marland Kitchen labetalol (NORMODYNE) 300 MG tablet Take 1 tablet (300 mg total) by mouth 2 (two) times daily.  Marland Kitchen levothyroxine (SYNTHROID) 88 MCG tablet Take 1 tablet (88 mcg total) by mouth daily. Keep scheduled appt need TSH check for future refills  . polyethylene glycol (MIRALAX / GLYCOLAX) packet Take 17 g by mouth daily as needed for mild constipation.     Allergies:   Lac bovis, Lipitor [atorvastatin], Other, Spironolactone, Zocor [simvastatin], Coreg [carvedilol], Donepezil hydrochloride, Hydrochlorothiazide, and Razadyne [galantamine hydrobromide]   Social History   Socioeconomic History  . Marital status: Widowed    Spouse name: Not on file  .  Number of children: 3  . Years of education: Not on file  . Highest education level: Not on file  Occupational History  . Not on file  Tobacco Use  . Smoking status: Never Smoker  . Smokeless tobacco: Never Used  Substance and Sexual Activity  . Alcohol use: No  . Drug use: No  . Sexual activity: Not Currently  Other Topics Concern  . Not on file  Social History Narrative   Patient is right handed.   Patient drinks 1-2 cups caffeine daily.   Social Determinants of Health   Financial Resource Strain:   . Difficulty of Paying Living Expenses:   Food Insecurity:   . Worried About Charity fundraiser in the Last Year:   . Arboriculturist in the Last Year:   Transportation Needs:   . Film/video editor (Medical):   Marland Kitchen Lack of Transportation (Non-Medical):   Physical Activity:   . Days of Exercise per Week:   . Minutes of Exercise per Session:   Stress:   . Feeling of Stress :   Social Connections:   . Frequency of Communication with Friends and Family:   . Frequency of Social Gatherings with Friends and Family:   . Attends Religious Services:   . Active Member of Clubs or Organizations:   . Attends Archivist Meetings:   Marland Kitchen Marital Status:      Family History: The patient's family history includes Dementia in his mother; Heart disease in his sister; Hypertension in an other family member. There is no history of Colon cancer, Stomach cancer, Esophageal cancer, or Rectal cancer.  ROS:   Please see the history of present illness.    ROS  All other systems reviewed and negative.   EKGs/Labs/Other Studies Reviewed:    The following studies were reviewed today: none  EKG:  EKG is  ordered today.  The ekg ordered today demonstrates NSR with nonspecific ST abnormality  Recent Labs: 08/09/2018: NT-Pro BNP 1,162 04/18/2019: ALT 28   Recent Lipid Panel    Component Value Date/Time   CHOL 112 04/18/2019 1001   TRIG 111 04/18/2019 1001   HDL 47 04/18/2019  1001   CHOLHDL 2.4 04/18/2019 1001   CHOLHDL 6 08/29/2017 1605   VLDL 27.6 08/29/2017 1605   LDLCALC 45 04/18/2019 1001   LDLDIRECT 123.0 03/25/2014 1112    Physical Exam:    VS:  BP (!) 148/88   Pulse 72   Ht 6\' 1"  (1.854 m)   Wt 189 lb 3.2 oz (85.8 kg)   BMI 24.96 kg/m      Wt Readings from Last 3 Encounters:  06/12/19 189 lb 3.2 oz (85.8 kg)  03/28/19 198 lb (89.8 kg)  12/25/18 195 lb (88.5 kg)     GEN:  Well nourished, well developed in no acute distress HEENT: Normal NECK: No JVD; No carotid bruits LYMPHATICS: No lymphadenopathy CARDIAC: RRR, no murmurs, rubs, gallops RESPIRATORY:  Clear to auscultation without rales, wheezing or rhonchi  ABDOMEN: Soft, non-tender, non-distended MUSCULOSKELETAL:  No edema; No deformity  SKIN: Warm and dry NEUROLOGIC:  Alert and oriented x 3 PSYCHIATRIC:  Normal affect   ASSESSMENT:    1. Coronary artery disease involving native coronary artery of native heart without angina pectoris   2. Essential hypertension   3. Shortness of breath   4. Hyperlipidemia, unspecified hyperlipidemia type   5. Orthostatic hypotension   6. Bilateral carotid artery stenosis    PLAN:    In order of problems listed above:  1.  ASCAD -nonobstructive CAD by cath in 2011 -he has not had any anginal sx -continue ASA, BB and statin  2.  HTN -BP is controlled on exam -since he is having problems with orthostatic hypotension I suspect the BB is exacerbating things due to suppressing chronotropic response to low BP  3.  SOB -lexiscan myoview showed no ischemia and 2D echo with normal LVF -G1DD on echo -PYP scan was equivocal for amyloid and cardiac MRI cannot be due to CKD -SPEP and UPEP were normal  4.  HLD -LDLgoal < 70 -LDL was 45 in Feb 2021 -continue Crestor 40mg  daily  5.  Orthostatic Hypotension -he continues to have dizziness with supine HTN and orthostasis when standing  -he is orthostatic when going from sitting to standing  dropping 3mmhg and gets dizzy -encouraged him to use compression hose during the day - thigh high - he tells me that his nephrologist PA told him not to wear them because they are too hard to get on and he also said that the DME told him they would be too tight for him so he never got the Rx filled -add abdominal binder to wear during the day -cannot use Proamatine given that he is already having supine HTN and this will exacerbate it -Would avoid Florinef in setting of HTN  -will discuss with nephrology whether we can get him off of BB and onto another BP med - I have sent Dr. David Stall a note on whether this would be ok -he has been evaluated by Neuro - Dr. Jannifer Franklin - with no findings on MRI but now complaining of more muscle wasting in his upper torso - I will have him see Dr. Jannifer Franklin again to make sure we are not dealing with a neurodegerative disorder and see if he has any other recommendations for treatment of orthostatics.  6.  Carotid artery stenosis -dopplers 11/2018 showed 1-39% bilateral stenosis -continue ASA and statin   Medication Adjustments/Labs and Tests Ordered: Current medicines are reviewed at length with the patient today.  Concerns regarding medicines are outlined above.  Orders Placed This Encounter  Procedures  . EKG 12-Lead   No orders of the defined types were placed in this encounter.   Signed, Fransico Him, MD  06/12/2019 2:45 PM    [facility]

## 2019-06-12 NOTE — Addendum Note (Signed)
Addended by: Antonieta Iba on: 06/12/2019 02:51 PM   Modules accepted: Orders

## 2019-06-17 ENCOUNTER — Other Ambulatory Visit: Payer: Self-pay | Admitting: Internal Medicine

## 2019-06-18 DIAGNOSIS — N400 Enlarged prostate without lower urinary tract symptoms: Secondary | ICD-10-CM | POA: Diagnosis not present

## 2019-06-18 DIAGNOSIS — N453 Epididymo-orchitis: Secondary | ICD-10-CM | POA: Diagnosis not present

## 2019-06-26 ENCOUNTER — Encounter: Payer: Self-pay | Admitting: Internal Medicine

## 2019-06-26 ENCOUNTER — Other Ambulatory Visit: Payer: Self-pay

## 2019-06-26 ENCOUNTER — Ambulatory Visit (INDEPENDENT_AMBULATORY_CARE_PROVIDER_SITE_OTHER): Payer: PPO | Admitting: Internal Medicine

## 2019-06-26 VITALS — BP 148/78 | HR 74 | Temp 98.6°F | Ht 73.0 in | Wt 191.0 lb

## 2019-06-26 DIAGNOSIS — M542 Cervicalgia: Secondary | ICD-10-CM | POA: Diagnosis not present

## 2019-06-26 DIAGNOSIS — N185 Chronic kidney disease, stage 5: Secondary | ICD-10-CM | POA: Diagnosis not present

## 2019-06-26 NOTE — Progress Notes (Signed)
Subjective:  Patient ID: Kenneth Williams, male    DOB: 1936/02/21  Age: 84 y.o. MRN: [id number]  CC: No chief complaint on file.   HPI [name] presents for depression, apathy - he did not take Ritalin. C/o prostate issues - seeing Dr Diona Fanti. He has a testicular tumor - needs surgery. He is here w/dtr [name] FTF >45 min discussing ESRD, weakness, orthostatic sx's   Outpatient Medications Prior to Visit  Medication Sig Dispense Refill  . acetaminophen (TYLENOL) 500 MG tablet Take 500 mg by mouth 2 (two) times daily as needed for mild pain.    Marland Kitchen buPROPion (WELLBUTRIN SR) 150 MG 12 hr tablet TAKE 1 TABLET BY MOUTH EVERY DAY 90 tablet 2  . cholecalciferol (VITAMIN D) 1000 UNITS tablet Take 1,000 Units by mouth daily.     . cloNIDine (CATAPRES) 0.1 MG tablet TAKE 1 TABLET (0.1 MG TOTAL) BY MOUTH 3 (THREE) TIMES DAILY AS NEEDED (TAKE FOR SYSTOLIC BP GREATER THAN 235). 270 tablet 1  . colchicine 0.6 MG tablet TAKE 1 TABLET (0.6 MG TOTAL) BY MOUTH 3 (THREE) TIMES DAILY AS NEEDED. FOR GOUT 60 tablet 2  . Cyanocobalamin (VITAMIN B 12 PO) Take 1 tablet by mouth daily.      Marland Kitchen ezetimibe (ZETIA) 10 MG tablet Take 1 tablet (10 mg total) by mouth daily. 90 tablet 3  . fluticasone (FLONASE) 50 MCG/ACT nasal spray Place 2 sprays into both nostrils daily. 16 g 6  . furosemide (LASIX) 20 MG tablet Take 20 mg by mouth daily.    . hydrALAZINE (APRESOLINE) 25 MG tablet Take 25 mg by mouth 2 (two) times daily.    Marland Kitchen labetalol (NORMODYNE) 100 MG tablet Take 100 mg by mouth 2 (two) times daily.    Marland Kitchen levothyroxine (SYNTHROID) 88 MCG tablet Take 1 tablet (88 mcg total) by mouth daily. Keep scheduled appt need TSH check for future refills 90 tablet 0  . polyethylene glycol (MIRALAX / GLYCOLAX) packet Take 17 g by mouth daily as needed for mild constipation. 14 each 0  . rosuvastatin (CRESTOR) 40 MG tablet Take 1 tablet (40 mg total) by mouth daily. 90 tablet 3  . labetalol (NORMODYNE) 300 MG tablet Take 1  tablet (300 mg total) by mouth 2 (two) times daily. 60 tablet 11   No facility-administered medications prior to visit.    ROS: Review of Systems  Constitutional: Positive for fatigue. Negative for appetite change and unexpected weight change.  HENT: Negative for congestion, nosebleeds, sneezing, sore throat and trouble swallowing.   Eyes: Negative for itching and visual disturbance.  Respiratory: Negative for cough.   Cardiovascular: Negative for chest pain, palpitations and leg swelling.  Gastrointestinal: Negative for abdominal distention, blood in stool, diarrhea and nausea.  Genitourinary: Positive for frequency and urgency. Negative for hematuria.  Musculoskeletal: Positive for arthralgias. Negative for back pain, gait problem, joint swelling and neck pain.  Skin: Negative for rash.  Neurological: Negative for dizziness, tremors, speech difficulty and weakness.  Psychiatric/Behavioral: Positive for decreased concentration. Negative for agitation, dysphoric mood, sleep disturbance and suicidal ideas. The patient is nervous/anxious.     Objective:  BP (!) 148/78 (BP Location: Left Arm, Patient Position: Standing, Cuff Size: Normal)   Pulse 74   Temp 98.6 F (37 C) (Oral)   Ht 6\' 1"  (1.854 m)   Wt 191 lb (86.6 kg)   SpO2 96%   BMI 25.20 kg/m   BP Readings from Last 3 Encounters:  06/26/19 (!) 148/78  06/12/19 (!) 148/88  03/28/19 (!) 150/74    Wt Readings from Last 3 Encounters:  06/26/19 191 lb (86.6 kg)  06/12/19 189 lb 3.2 oz (85.8 kg)  03/28/19 198 lb (89.8 kg)    Physical Exam Constitutional:      General: He is not in acute distress.    Appearance: He is well-developed.     Comments: NAD  Eyes:     Conjunctiva/sclera: Conjunctivae normal.     Pupils: Pupils are equal, round, and reactive to light.  Neck:     Thyroid: No thyromegaly.     Vascular: No JVD.  Cardiovascular:     Rate and Rhythm: Normal rate and regular rhythm.     Heart sounds: Normal  heart sounds. No murmur. No friction rub. No gallop.   Pulmonary:     Effort: Pulmonary effort is normal. No respiratory distress.     Breath sounds: Normal breath sounds. No wheezing or rales.  Chest:     Chest wall: No tenderness.  Abdominal:     General: Bowel sounds are normal. There is no distension.     Palpations: Abdomen is soft. There is no mass.     Tenderness: There is no abdominal tenderness. There is no guarding or rebound.  Musculoskeletal:        General: No tenderness. Normal range of motion.     Cervical back: Normal range of motion.  Lymphadenopathy:     Cervical: No cervical adenopathy.  Skin:    General: Skin is warm and dry.     Findings: No rash.  Neurological:     Mental Status: He is alert and oriented to person, place, and time.     Cranial Nerves: No cranial nerve deficit.     Motor: No abnormal muscle tone.     Coordination: Coordination normal.     Gait: Gait normal.     Deep Tendon Reflexes: Reflexes are normal and symmetric.  Psychiatric:        Behavior: Behavior normal.        Thought Content: Thought content normal.        Judgment: Judgment normal.   neck is stiff FTF >45 min w/pt and his dtr discussing multiple issues  Lab Results  Component Value Date   WBC 9.7 01/25/2018   HGB 12.6 (L) 01/25/2018   HCT 37.9 (L) 01/25/2018   PLT 264.0 01/25/2018   GLUCOSE 97 01/25/2018   CHOL 112 04/18/2019   TRIG 111 04/18/2019   HDL 47 04/18/2019   LDLDIRECT 123.0 03/25/2014   LDLCALC 45 04/18/2019   ALT 28 04/18/2019   AST 11 09/29/2018   NA 139 01/25/2018   K 4.6 01/25/2018   CL 109 01/25/2018   CREATININE 2.34 (H) 01/25/2018   BUN 27 (H) 01/25/2018   CO2 22 01/25/2018   TSH 1.58 01/25/2018   PSA 1.73 05/18/2016   INR 1.03 09/13/2017    MR BRAIN WO CONTRAST  Result Date: 12/29/2018  [facility] [address] [place], [place] [address] [phone] NEUROIMAGING REPORT STUDY DATE: 1936-01-19 PATIENT NAME: Kenneth Williams DOB: 01-Jun-1935 MRN: [id number] EXAM: MRI Brain without contrast ORDERING CLINICIAN: Kathrynn Ducking, MD CLINICAL HISTORY: 84 year old man with memory loss COMPARISON FILMS: MRI 02/27/2004. TECHNIQUE: MRI of the brain without contrast was obtained utilizing 5 mm axial slices with T1, T2, T2 flair, SWI and diffusion weighted views.  T1 sagittal and T2 coronal views were obtained. CONTRAST: none IMAGING SITE: [facility],  [address], [place] FINDINGS: On sagittal images, the spinal cord is imaged caudally to C3 and is normal in caliber.   The contents of the posterior fossa are of normal size and position.   The pituitary gland and optic chiasm appear normal.    There is moderate generalized cortical atrophy, progressed compared to the MRI from 2006.  There are no abnormal extra-axial collections of fluid.  There are T2/flair hyperintense foci in the pons and in the hemispheres, predominantly in the deep white matter.  None of these appear to be acute.  This has progressed compared to the 2006 MRI.  The deep gray matter appears normal.  Diffusion weighted images are normal.  Susceptibility weighted images are normal.  The VIIth/VIIIth nerve complex appears normal.  There is a mild right mastoid effusion.  The left mastoid air cells appear normal.  Mucous retention cyst in the posterior ethmoid air cell on the right.  The other paranasal sinuses appear normal.  There have been bilateral cataract extractions since 2006.  The orbits are otherwise normal.  Flow voids are identified within the major intracerebral arteries.     This MRI of the brain without contrast shows the following: 1.   Moderate generalized cortical atrophy. 2.   Mild chronic microvascular ischemic changes involving the pons and hemispheres 3.   Minimal right mastoid effusion that could be due to eustachian tube dysfunction.  4.   There are no acute findings. INTERPRETING PHYSICIAN: Richard A. Felecia Shelling, MD, PhD, FAAN Certified in   Neuroimaging by [employer]    Assessment & Plan:    Walker Kehr, MD

## 2019-06-26 NOTE — Assessment & Plan Note (Signed)
Chronic stage 4-5

## 2019-07-02 ENCOUNTER — Encounter: Payer: Self-pay | Admitting: Internal Medicine

## 2019-07-13 DIAGNOSIS — I129 Hypertensive chronic kidney disease with stage 1 through stage 4 chronic kidney disease, or unspecified chronic kidney disease: Secondary | ICD-10-CM | POA: Diagnosis not present

## 2019-07-13 DIAGNOSIS — H9192 Unspecified hearing loss, left ear: Secondary | ICD-10-CM | POA: Diagnosis not present

## 2019-07-13 DIAGNOSIS — Z9009 Acquired absence of other part of head and neck: Secondary | ICD-10-CM | POA: Diagnosis not present

## 2019-07-13 DIAGNOSIS — Z48810 Encounter for surgical aftercare following surgery on the sense organs: Secondary | ICD-10-CM | POA: Diagnosis not present

## 2019-07-13 DIAGNOSIS — Z9621 Cochlear implant status: Secondary | ICD-10-CM | POA: Diagnosis not present

## 2019-07-13 DIAGNOSIS — H8011 Otosclerosis involving oval window, obliterative, right ear: Secondary | ICD-10-CM | POA: Diagnosis not present

## 2019-07-13 DIAGNOSIS — N183 Chronic kidney disease, stage 3 unspecified: Secondary | ICD-10-CM | POA: Diagnosis not present

## 2019-07-13 DIAGNOSIS — Z9079 Acquired absence of other genital organ(s): Secondary | ICD-10-CM | POA: Diagnosis not present

## 2019-07-13 DIAGNOSIS — H90A31 Mixed conductive and sensorineural hearing loss, unilateral, right ear with restricted hearing on the contralateral side: Secondary | ICD-10-CM | POA: Diagnosis not present

## 2019-07-25 DIAGNOSIS — H9110 Presbycusis, unspecified ear: Secondary | ICD-10-CM | POA: Diagnosis not present

## 2019-07-25 DIAGNOSIS — I129 Hypertensive chronic kidney disease with stage 1 through stage 4 chronic kidney disease, or unspecified chronic kidney disease: Secondary | ICD-10-CM | POA: Diagnosis not present

## 2019-07-25 DIAGNOSIS — M109 Gout, unspecified: Secondary | ICD-10-CM | POA: Diagnosis not present

## 2019-07-25 DIAGNOSIS — E213 Hyperparathyroidism, unspecified: Secondary | ICD-10-CM | POA: Diagnosis not present

## 2019-07-25 DIAGNOSIS — N184 Chronic kidney disease, stage 4 (severe): Secondary | ICD-10-CM | POA: Diagnosis not present

## 2019-07-25 DIAGNOSIS — F329 Major depressive disorder, single episode, unspecified: Secondary | ICD-10-CM | POA: Diagnosis not present

## 2019-07-25 DIAGNOSIS — E039 Hypothyroidism, unspecified: Secondary | ICD-10-CM | POA: Diagnosis not present

## 2019-07-25 DIAGNOSIS — D631 Anemia in chronic kidney disease: Secondary | ICD-10-CM | POA: Diagnosis not present

## 2019-08-03 DIAGNOSIS — M542 Cervicalgia: Secondary | ICD-10-CM | POA: Diagnosis not present

## 2019-08-08 DIAGNOSIS — M542 Cervicalgia: Secondary | ICD-10-CM | POA: Diagnosis not present

## 2019-08-10 DIAGNOSIS — M542 Cervicalgia: Secondary | ICD-10-CM | POA: Diagnosis not present

## 2019-08-13 ENCOUNTER — Ambulatory Visit: Payer: PPO | Admitting: Physician Assistant

## 2019-08-14 NOTE — Progress Notes (Signed)
Cardiology Office Note    Date:  08/15/2019   ID:  Kenneth Williams, DOB September 01, 1935, MRN [id number]  PCP:  Kenneth Anger, MD  Cardiologist: Kenneth Him, MD EPS: None  No chief complaint on file.   History of Present Illness:  Kenneth Williams is a 84 y.o. male with a hx of CAD with nonobstructive CAD by cath in 2011, hyperlipidemia, hypertension, orthostatic hypotension and elevated calcium score of 727.  Due to DOE a 2D echo was done showing normal LVF with G1 DD.  Nuclear stress test 07/2018 showed no ischemia.     Patient last saw Dr. Radford Williams in 05/2019 at which time he was having trouble with orthostatic hypotension with 74mm Hg blood pressure drop.  She ordered an abdominal binder, thigh-high compression stockings.  Referred back to nephrology and neurology.  Patient comes in for f/u. Still has some dizziness when he stands or walks. Not using compression stockings or abdominal binder. Labetolol cut back 8 months ago. Also on Lasix and hydralazine prescribed by renal. Overall he seems stable. In PT for weak next muscles. Never made appt with neurology. Chronic dyspnea on exertion unchanged.   Past Medical History:  Diagnosis Date  . Actinic keratosis 10/22/2009   Face  4/17 neck, hand 11/20 nose  . Acute upper respiratory infection 05/05/2010   2/17     . Blepharitis of both eyes 03/02/2018   2020 eryth oint  . BPH (benign prostatic hyperplasia)   . CAD (coronary artery disease)    cath 2011-mild non obstructive, coronary artery calcium score of 727 on CT 04/13/2018  . Carotid artery stenosis 02/29/2012   2013 B 0-39% q 24 mo   . Coronary artery disease 04/03/2018   2/20 coronary calcium score is 727. It is consistent with a extensive degree of coronary atherosclerosis.  Pravastatin, ASA Cardiol - Dr Kenneth Williams  . CRF (chronic renal failure), stage 5 ([place]) 08/14/2009   Chronic stage 4-5 Dr Kenneth Williams The patient underwent a TURP on 11/04/2017 S/p nephrology consult 2019 SBP 130-140 at  home  . Diverticulitis of colon 10/13/2006   Dr Fuller Plan Colon 2014   . DOE (dyspnea on exertion) 03/03/2017   1/19  . Dyslipidemia 07/21/2016   5/18 D/c Simvastatin due to potential drug interactions 2018 Start Lipitor 11/18Stopped Lipitor - diarrhea stopped and then came back again... Off Lipitor now 2019 Livalo - too $$$: try Pravachol 12/19 CT ca scoring option discussed  . Dyspnea    with activity  . E coli bacteremia   . Elevated PSA    history of   . Enlarged prostate with urinary retention 11/04/2017  . Gout    2009  . Gout 07/21/2016   Recurrent   . History of diverticulitis of colon   . History of pancreatitis   . History of TIAs   . HYPERKALEMIA 10/22/2009   Sporadic     . Hyperlipidemia   . Hypertension   . Hypothyroidism 03/03/2007   Chronic  On Levothroid   . Lactose intolerance 03/03/2017  . Left ear hearing loss    wears right hearing aid   without hearing aides deaf  . Memory loss 03/03/2007   2011 2016 Dr Jannifer Franklin - mild cognitive deficit 2016 Pt stopped Exelon on - recomended per Dr Jannifer Franklin' . Memory loss was attributed more to depression. 12/16 start Wellbutrin Pt needs to get hearing aids - much better after hearing was restored   . Neoplasm of uncertain behavior of skin 09/07/2010  1/16 7/20 Skin lesion on back - skin bx  . Onychomycosis of toenail 04/15/2014   Extensive 2016   . Orthostatic hypotension 03/03/2017   1/19 Reduce Labetalol down to 1 tab twice a day Drink more water  . PANCREATITIS, HX OF 10/13/2006   Qualifier: Diagnosis of  By: Kenneth Williams    . Personal history of hyperthyroidism 2006   s/p 131I- Dr. Chalmers Cater, Hyperthyroid  . Pseudogout 10/27/2010   2012 NSAIDs prn - very rare Colchicine prn  . Pyelonephritis   . Renal insufficiency 2011  . Sebaceous cyst of eyelid, left 08/31/2018   7/20  . TGA (transient global amnesia)   . TIA (transient ischemic attack) 10/13/2006    2006, 2012, 03/16/14 Dr Jannifer Franklin On Aspirin; BP meds    Past Surgical History:    Procedure Laterality Date  . CHOLECYSTECTOMY    . COLONOSCOPY    . IR FLUORO GUIDE CV LINE RIGHT  09/14/2017  . IR REMOVAL TUN CV CATH W/O FL  09/20/2017  . IR US GUIDE VASC ACCESS RIGHT  09/14/2017  . right ear  surgery     BAHA bone anchored  with screw  . STAPEDES SURGERY     LEFT EAR  . TRANSURETHRAL RESECTION OF PROSTATE    . TRANSURETHRAL RESECTION OF PROSTATE N/A 11/04/2017   Procedure: TRANSURETHRAL RESECTION OF THE PROSTATE (TURP);  Surgeon: Kenneth Gallo, MD;  Location: [facility];  Service: Urology;  Laterality: N/A;  1 HR    Current Medications: Current Meds  Medication Sig  . acetaminophen (TYLENOL) 500 MG tablet Take 500 mg by mouth 2 (two) times daily as needed for mild pain.  Kenneth Williams buPROPion (WELLBUTRIN SR) 150 MG 12 hr tablet TAKE 1 TABLET BY MOUTH EVERY DAY  . cholecalciferol (VITAMIN D) 1000 UNITS tablet Take 1,000 Units by mouth daily.   . cloNIDine (CATAPRES) 0.1 MG tablet TAKE 1 TABLET (0.1 MG TOTAL) BY MOUTH 3 (THREE) TIMES DAILY AS NEEDED (TAKE FOR SYSTOLIC BP GREATER THAN 300).  . colchicine 0.6 MG tablet TAKE 1 TABLET (0.6 MG TOTAL) BY MOUTH 3 (THREE) TIMES DAILY AS NEEDED. FOR GOUT  . Cyanocobalamin (VITAMIN B 12 PO) Take 1 tablet by mouth daily.    Kenneth Williams ezetimibe (ZETIA) 10 MG tablet Take 1 tablet (10 mg total) by mouth daily.  . fluticasone (FLONASE) 50 MCG/ACT nasal spray Place 2 sprays into both nostrils daily.  . furosemide (LASIX) 20 MG tablet Take 20 mg by mouth daily.  . hydrALAZINE (APRESOLINE) 25 MG tablet Take 25 mg by mouth 2 (two) times daily.  Kenneth Williams labetalol (NORMODYNE) 100 MG tablet Take 100 mg by mouth 2 (two) times daily.  Kenneth Williams levothyroxine (SYNTHROID) 88 MCG tablet Take 1 tablet (88 mcg total) by mouth daily. Keep scheduled appt need TSH check for future refills  . polyethylene glycol (MIRALAX / GLYCOLAX) packet Take 17 g by mouth daily as needed for mild constipation.  . sodium bicarbonate 325 MG tablet Take 325 mg by mouth 4 (four) times daily.      Allergies:   Lac bovis, Lipitor [atorvastatin], Other, Spironolactone, Zocor [simvastatin], Coreg [carvedilol], Donepezil hydrochloride, Hydrochlorothiazide, and Razadyne [galantamine hydrobromide]   Social History   Socioeconomic History  . Marital status: Widowed    Spouse name: Not on file  . Number of children: 3  . Years of education: Not on file  . Highest education level: Not on file  Occupational History  . Not on file  Tobacco Use  . Smoking status:  Never Smoker  . Smokeless tobacco: Never Used  Vaping Use  . Vaping Use: Never used  Substance and Sexual Activity  . Alcohol use: No  . Drug use: No  . Sexual activity: Not Currently  Other Topics Concern  . Not on file  Social History Narrative   Patient is right handed.   Patient drinks 1-2 cups caffeine daily.   Social Determinants of Health   Financial Resource Strain:   . Difficulty of Paying Living Expenses:   Food Insecurity:   . Worried About Charity fundraiser in the Last Year:   . Arboriculturist in the Last Year:   Transportation Needs:   . Film/video editor (Medical):   Kenneth Williams Lack of Transportation (Non-Medical):   Physical Activity:   . Days of Exercise per Week:   . Minutes of Exercise per Session:   Stress:   . Feeling of Stress :   Social Connections:   . Frequency of Communication with Friends and Family:   . Frequency of Social Gatherings with Friends and Family:   . Attends Religious Services:   . Active Member of Clubs or Organizations:   . Attends Archivist Meetings:   Kenneth Williams Marital Status:      Family History:  The patient's family history includes Dementia in his mother; Heart disease in his sister; Hypertension in an other family member.   ROS:   Please see the history of present illness.    ROS All other systems reviewed and are negative.   PHYSICAL EXAM:   VS:  BP (!) 150/78   Pulse 74   Ht 6\' 1"  (1.854 m)   Wt 195 lb 12.8 oz (88.8 kg)   SpO2 95%   BMI 25.83  kg/m   Physical Exam  GEN: Well nourished, well developed, in no acute distress  Neck: no JVD, carotid bruits, or masses Cardiac:RRR; no murmurs, rubs, or gallops  Respiratory:  clear to auscultation bilaterally, normal work of breathing GI: soft, nontender, nondistended, + BS Ext: without cyanosis, clubbing, or edema, Good distal pulses bilaterally Neuro:  Alert and Oriented x 3 Psych: euthymic mood, full affect  Wt Readings from Last 3 Encounters:  08/15/19 195 lb 12.8 oz (88.8 kg)  06/26/19 191 lb (86.6 kg)  06/12/19 189 lb 3.2 oz (85.8 kg)      Studies/Labs Reviewed:   EKG:  EKG is not ordered today.    Recent Labs: 04/18/2019: ALT 28   Lipid Panel    Component Value Date/Time   CHOL 112 04/18/2019 1001   TRIG 111 04/18/2019 1001   HDL 47 04/18/2019 1001   CHOLHDL 2.4 04/18/2019 1001   CHOLHDL 6 08/29/2017 1605   VLDL 27.6 08/29/2017 1605   LDLCALC 45 04/18/2019 1001   LDLDIRECT 123.0 03/25/2014 1112    Additional studies/ records that were reviewed today include:  Myocardial amyloid imaging 09/19/2018  Equivocal study for TTR amyloidosis with Grade 1 uptake and H/Cl ratio 1.2.  Consider Cardiac MRI and/or SPEP/UPEP.   Can 5/6/2020Study Highlights     Nuclear stress EF: 53%.  There was no ST segment deviation noted during stress.  The study is normal.  This is a low risk study.  The left ventricular ejection fraction is mildly decreased (45-54%).   Normal resting and stress perfusion. No ischemia or infarction EF 53%  07/2018 IMPRESSIONS     1. The left ventricle has normal systolic function, with an ejection  fraction of 55-60%. The  cavity size was normal. There is mildly increased  left ventricular wall thickness. Left ventricular diastolic Doppler  parameters are consistent with impaired  relaxation. Basal inferior hypokinesis.   2. The right ventricle has normal systolic function. The cavity was  normal. There is no increase in right  ventricular wall thickness.   3. Left atrial size was mildly dilated.   4. Trivial pericardial effusion is present.   5. No evidence of mitral valve stenosis. Trivial mitral regurgitation.   6. The aortic valve is tricuspid. Mild calcification of the aortic valve.  Aortic valve regurgitation is trivial by color flow Doppler. No stenosis  of the aortic valve.   7. The aortic root is normal in size and structure.   8. Normal IVC size. No complete TR doppler jet so unable to estimate PA  systolic pressure.    ASSESSMENT:    1. Coronary artery disease involving native coronary artery of native heart without angina pectoris   2. Essential hypertension   3. Orthostatic hypotension   4. Hyperlipidemia, unspecified hyperlipidemia type      PLAN:  In order of problems listed above:  CAD nonobstructive by cath in 2011, Lexiscan-2020 no ischemia, normal LV function with grade 1 DD on 2D echo PYP scan was equivocal for amyloid and cardiac MRI cannot be done due to CKD. Chronic DOE unchanged and felt secondary to deconditioning. Can't do much because of orthostatic hypotension  Essential hypertension-supine on clonidine prn, lasix, hydralazine, labetolol  Orthostatic hypotension patient has resting hypertension so pro M 18 cannot be used as it will exacerbated and Dr. Radford Williams said to avoid Florinef.  She was in touch with nephrology to see whether or not we could use another blood pressure medicine get Williams off beta-blocker.  Also was supposed  To see Dr. Jannifer Franklin to make sure there is no neuro degenerative disorder going on but patient didn't make appt.  He was asked to use thigh-high compression stockings and also given a prescription for an abdominal binder. He's not using either but willing to try knee high stockings. On a lot of meds that can cause this but managed by renal.   Hyperlipidemia LDL 45 04/18/19    Medication Adjustments/Labs and Tests Ordered: Current medicines are reviewed at  length with the patient today.  Concerns regarding medicines are outlined above.  Medication changes, Labs and Tests ordered today are listed in the Patient Instructions below. Patient Instructions  Medication Instructions:  Your physician recommends that you continue on your current medications as directed. Please refer to the Current Medication list given to you today.  *If you need a refill on your cardiac medications before your next appointment, please call your pharmacy*   Lab Work: None  If you have labs (blood work) drawn today and your tests are completely normal, you will receive your results only by: Kenneth Williams MyChart Message (if you have MyChart) OR . A paper copy in the mail If you have any lab test that is abnormal or we need to change your treatment, we will call you to review the results.   Testing/Procedures: None   Follow-Up: Follow up with Dr. Radford Williams on 02/18/20 at 1:20 PM   Other Instructions Wear compression stockings for orthostatic hypotension     Signed, Ermalinda Barrios, PA-C  08/15/2019 1:47 PM    [facility] [address], [place], [place]  [address] Phone: [phone]; Fax: [phone]

## 2019-08-15 ENCOUNTER — Ambulatory Visit: Payer: PPO | Admitting: Physician Assistant

## 2019-08-15 ENCOUNTER — Other Ambulatory Visit: Payer: Self-pay

## 2019-08-15 ENCOUNTER — Encounter: Payer: Self-pay | Admitting: Physician Assistant

## 2019-08-15 VITALS — BP 150/78 | HR 74 | Ht 73.0 in | Wt 195.8 lb

## 2019-08-15 DIAGNOSIS — I10 Essential (primary) hypertension: Secondary | ICD-10-CM | POA: Diagnosis not present

## 2019-08-15 DIAGNOSIS — E785 Hyperlipidemia, unspecified: Secondary | ICD-10-CM | POA: Diagnosis not present

## 2019-08-15 DIAGNOSIS — I2510 Atherosclerotic heart disease of native coronary artery without angina pectoris: Secondary | ICD-10-CM

## 2019-08-15 DIAGNOSIS — I951 Orthostatic hypotension: Secondary | ICD-10-CM | POA: Diagnosis not present

## 2019-08-15 NOTE — Patient Instructions (Addendum)
Medication Instructions:  Your physician recommends that you continue on your current medications as directed. Please refer to the Current Medication list given to you today.  *If you need a refill on your cardiac medications before your next appointment, please call your pharmacy*   Lab Work: None  If you have labs (blood work) drawn today and your tests are completely normal, you will receive your results only by: Marland Kitchen MyChart Message (if you have MyChart) OR . A paper copy in the mail If you have any lab test that is abnormal or we need to change your treatment, we will call you to review the results.   Testing/Procedures: None   Follow-Up: Follow up with Dr. [name] on 02/18/20 at 1:20 PM   Other Instructions Wear compression stockings for orthostatic hypotension

## 2019-08-17 DIAGNOSIS — M542 Cervicalgia: Secondary | ICD-10-CM | POA: Diagnosis not present

## 2019-09-03 DIAGNOSIS — M542 Cervicalgia: Secondary | ICD-10-CM | POA: Diagnosis not present

## 2019-09-07 DIAGNOSIS — M542 Cervicalgia: Secondary | ICD-10-CM | POA: Diagnosis not present

## 2019-09-10 ENCOUNTER — Other Ambulatory Visit: Payer: Self-pay | Admitting: Internal Medicine

## 2019-09-10 DIAGNOSIS — M542 Cervicalgia: Secondary | ICD-10-CM | POA: Diagnosis not present

## 2019-09-12 DIAGNOSIS — M542 Cervicalgia: Secondary | ICD-10-CM | POA: Diagnosis not present

## 2019-09-20 DIAGNOSIS — H9071 Mixed conductive and sensorineural hearing loss, unilateral, right ear, with unrestricted hearing on the contralateral side: Secondary | ICD-10-CM | POA: Diagnosis not present

## 2019-09-20 DIAGNOSIS — Z0110 Encounter for examination of ears and hearing without abnormal findings: Secondary | ICD-10-CM | POA: Diagnosis not present

## 2019-09-20 DIAGNOSIS — Z9889 Other specified postprocedural states: Secondary | ICD-10-CM | POA: Diagnosis not present

## 2019-09-20 DIAGNOSIS — H8091 Unspecified otosclerosis, right ear: Secondary | ICD-10-CM | POA: Diagnosis not present

## 2019-09-22 ENCOUNTER — Other Ambulatory Visit: Payer: Self-pay | Admitting: Neurology

## 2019-09-25 ENCOUNTER — Encounter: Payer: Self-pay | Admitting: Internal Medicine

## 2019-09-25 ENCOUNTER — Ambulatory Visit (INDEPENDENT_AMBULATORY_CARE_PROVIDER_SITE_OTHER): Payer: PPO

## 2019-09-25 ENCOUNTER — Other Ambulatory Visit: Payer: Self-pay

## 2019-09-25 ENCOUNTER — Ambulatory Visit (INDEPENDENT_AMBULATORY_CARE_PROVIDER_SITE_OTHER): Payer: PPO | Admitting: Internal Medicine

## 2019-09-25 VITALS — BP 130/80 | HR 72 | Temp 98.4°F | Resp 16 | Ht 73.0 in | Wt 194.0 lb

## 2019-09-25 DIAGNOSIS — R5382 Chronic fatigue, unspecified: Secondary | ICD-10-CM

## 2019-09-25 DIAGNOSIS — R453 Demoralization and apathy: Secondary | ICD-10-CM | POA: Diagnosis not present

## 2019-09-25 DIAGNOSIS — Z0000 Encounter for general adult medical examination without abnormal findings: Secondary | ICD-10-CM | POA: Diagnosis not present

## 2019-09-25 DIAGNOSIS — R739 Hyperglycemia, unspecified: Secondary | ICD-10-CM

## 2019-09-25 DIAGNOSIS — F332 Major depressive disorder, recurrent severe without psychotic features: Secondary | ICD-10-CM

## 2019-09-25 DIAGNOSIS — E039 Hypothyroidism, unspecified: Secondary | ICD-10-CM | POA: Diagnosis not present

## 2019-09-25 DIAGNOSIS — R413 Other amnesia: Secondary | ICD-10-CM

## 2019-09-25 LAB — BASIC METABOLIC PANEL WITH GFR
BUN/Creatinine Ratio: 10 (calc) (ref 6–22)
BUN: 41 mg/dL — ABNORMAL HIGH (ref 7–25)
CO2: 22 mmol/L (ref 20–32)
Calcium: 8.9 mg/dL (ref 8.6–10.3)
Chloride: 106 mmol/L (ref 98–110)
Creat: 4.08 mg/dL — ABNORMAL HIGH (ref 0.70–1.11)
GFR, Est African American: 15 mL/min/{1.73_m2} — ABNORMAL LOW (ref >=60)
GFR, Est Non African American: 13 mL/min/{1.73_m2} — ABNORMAL LOW (ref >=60)
Glucose, Bld: 88 mg/dL (ref 65–99)
Potassium: 4.4 mmol/L (ref 3.5–5.3)
Sodium: 140 mmol/L (ref 135–146)

## 2019-09-25 LAB — POCT GLUCOSE (DEVICE FOR HOME USE): POC Glucose: 183 mg/dl — AB (ref 70–99)

## 2019-09-25 MED ORDER — BUPROPION HCL ER (XL) 300 MG PO TB24
300.0000 mg | ORAL_TABLET | Freq: Every day | ORAL | 3 refills | Status: DC
Start: 2019-09-25 — End: 2020-07-28

## 2019-09-25 NOTE — Assessment & Plan Note (Signed)
TSH 

## 2019-09-25 NOTE — Assessment & Plan Note (Signed)
[facility] Labs 

## 2019-09-25 NOTE — Assessment & Plan Note (Signed)
Stable

## 2019-09-25 NOTE — Progress Notes (Addendum)
Subjective:   Kenneth Williams is a 84 y.o. male who presents for Medicare Annual/Subsequent preventive examination.  Review of Systems    No ROS. Medicare Wellness Visit. Additional risk factors are reflected in social history. Cardiac Risk Factors include: advanced age (>3men, >17 women);dyslipidemia;family history of premature cardiovascular disease;hypertension;male gender     Objective:    Today's Vitals   09/25/19 1359  BP: 130/80  Pulse: 72  Resp: 16  Temp: 98.4 F (36.9 C)  SpO2: 96%  Weight: 194 lb (88 kg)  Height: 6\' 1"  (1.854 m)  PainSc: 0-No pain   Body mass index is 25.6 kg/m.  Advanced Directives 09/25/2019 11/04/2017 11/04/2017 10/26/2017 09/06/2017 06/07/2017 09/23/2016  Does Patient Have a Medical Advance Directive? Yes Yes Yes Yes Yes Yes No  Type of Advance Directive - [place];Living will [place];Living will [place];Living will [place];Living will [place];Living will -  Does patient want to make changes to medical advance directive? No - Patient declined No - Patient declined No - Patient declined No - Patient declined No - Patient declined - Yes (ED - Information included in AVS)  Copy of [place] in Chart? - No - copy requested No - copy requested No - copy requested No - copy requested - -    Current Medications (verified) Outpatient Encounter Medications as of 09/25/2019  Medication Sig   aspirin 81 MG chewable tablet Chew 81 mg by mouth daily. Take 2 per day   acetaminophen (TYLENOL) 500 MG tablet Take 500 mg by mouth 2 (two) times daily as needed for mild pain.   cholecalciferol (VITAMIN D) 1000 UNITS tablet Take 1,000 Units by mouth daily.    cloNIDine (CATAPRES) 0.1 MG tablet TAKE 1 TABLET (0.1 MG TOTAL) BY MOUTH 3 (THREE) TIMES DAILY AS NEEDED (TAKE FOR SYSTOLIC BP GREATER THAN 235).   colchicine 0.6 MG tablet TAKE 1 TABLET (0.6 MG  TOTAL) BY MOUTH 3 (THREE) TIMES DAILY AS NEEDED. FOR GOUT   Cyanocobalamin (VITAMIN B 12 PO) Take 1 tablet by mouth daily.     ezetimibe (ZETIA) 10 MG tablet Take 1 tablet (10 mg total) by mouth daily.   fluticasone (FLONASE) 50 MCG/ACT nasal spray Place 2 sprays into both nostrils daily.   furosemide (LASIX) 20 MG tablet Take 20 mg by mouth daily.   hydrALAZINE (APRESOLINE) 25 MG tablet Take 25 mg by mouth 2 (two) times daily.   labetalol (NORMODYNE) 100 MG tablet Take 100 mg by mouth 2 (two) times daily.   levothyroxine (SYNTHROID) 88 MCG tablet TAKE 1 TABLET (88 MCG TOTAL) BY MOUTH DAILY. KEEP SCHEDULED APPT NEED TSH CHECK FOR FUTURE REFILLS   polyethylene glycol (MIRALAX / GLYCOLAX) packet Take 17 g by mouth daily as needed for mild constipation.   rosuvastatin (CRESTOR) 40 MG tablet Take 1 tablet (40 mg total) by mouth daily.   sodium bicarbonate 325 MG tablet Take 325 mg by mouth 4 (four) times daily.   [DISCONTINUED] buPROPion (WELLBUTRIN SR) 150 MG 12 hr tablet TAKE 1 TABLET BY MOUTH EVERY DAY   No facility-administered encounter medications on file as of 09/25/2019.    Allergies (verified) Lac bovis, Lipitor [atorvastatin], Other, Spironolactone, Zocor [simvastatin], Coreg [carvedilol], Donepezil hydrochloride, Hydrochlorothiazide, and Razadyne [galantamine hydrobromide]   History: Past Medical History:  Diagnosis Date   Actinic keratosis 10/22/2009   Face  4/17 neck, hand 11/20 nose   Acute upper respiratory  infection 05/05/2010   2/17      Blepharitis of both eyes 03/02/2018   2020 eryth oint   BPH (benign prostatic hyperplasia)    CAD (coronary artery disease)    cath 2011-mild non obstructive, coronary artery calcium score of 727 on CT 04/13/2018   Carotid artery stenosis 02/29/2012   2013 B 0-39% q 24 mo    Coronary artery disease 04/03/2018   2/20 coronary calcium score is 727.  It is consistent with a extensive degree of coronary atherosclerosis.  Pravastatin, ASA Cardiol - Dr  [name]   CRF (chronic renal failure), stage 5 ([place]) 08/14/2009   Chronic stage 4-5 Dr Johnney Ou The patient underwent a TURP on 11/04/2017 S/p nephrology consult 2019 SBP 130-140 at home   Diverticulitis of colon 10/13/2006   Dr Fuller Plan Colon 2014    DOE (dyspnea on exertion) 03/03/2017   1/19   Dyslipidemia 07/21/2016   5/18 D/c Simvastatin due to potential drug interactions 2018 Start Lipitor 11/18Stopped Lipitor - diarrhea stopped and then came back again... Off Lipitor now 2019 Livalo - too $$$: try Pravachol 12/19 CT ca scoring option discussed   Dyspnea    with activity   E coli bacteremia    Elevated PSA    history of    Enlarged prostate with urinary retention 11/04/2017   Gout    2009   Gout 07/21/2016   Recurrent    History of diverticulitis of colon    History of pancreatitis    History of TIAs    HYPERKALEMIA 10/22/2009   Sporadic      Hyperlipidemia    Hypertension    Hypothyroidism 03/03/2007   Chronic  On Levothroid    Lactose intolerance 03/03/2017   Left ear hearing loss    wears right hearing aid   without hearing aides deaf   Memory loss 03/03/2007   2011 2016 Dr Jannifer Franklin - mild cognitive deficit 2016 Pt stopped Exelon on - recomended per Dr Jannifer Franklin' . Memory loss was attributed more to depression. 12/16 start Wellbutrin Pt needs to get hearing aids - much better after hearing was restored    Neoplasm of uncertain behavior of skin 09/07/2010   1/16 7/20 Skin lesion on back - skin bx   Onychomycosis of toenail 04/15/2014   Extensive 2016    Orthostatic hypotension 03/03/2017   1/19 Reduce Labetalol down to 1 tab twice a day Drink more water   PANCREATITIS, HX OF 10/13/2006   Qualifier: Diagnosis of  By: Doralee Albino     Personal history of hyperthyroidism 2006   s/p 131I- Dr. Chalmers Cater, Hyperthyroid   Pseudogout 10/27/2010   2012 NSAIDs prn - very rare Colchicine prn   Pyelonephritis    Renal insufficiency 2011   Sebaceous cyst of eyelid, left 08/31/2018   7/20   TGA (transient  global amnesia)    TIA (transient ischemic attack) 10/13/2006    2006, 2012, 03/16/14 Dr Jannifer Franklin On Aspirin; BP meds   Past Surgical History:  Procedure Laterality Date   CHOLECYSTECTOMY     COLONOSCOPY     IR FLUORO GUIDE CV LINE RIGHT  09/14/2017   IR REMOVAL TUN CV CATH W/O FL  09/20/2017   IR US GUIDE VASC ACCESS RIGHT  09/14/2017   right ear  surgery     BAHA bone anchored  with screw   STAPEDES SURGERY     LEFT EAR   TRANSURETHRAL RESECTION OF PROSTATE     TRANSURETHRAL RESECTION OF PROSTATE N/A 11/04/2017  Procedure: TRANSURETHRAL RESECTION OF THE PROSTATE (TURP);  Surgeon: Franchot Gallo, MD;  Location: [facility];  Service: Urology;  Laterality: N/A;  1 HR   Family History  Problem Relation Age of Onset   Heart disease Sister    Dementia Mother    Hypertension Other    Colon cancer Neg Hx    Stomach cancer Neg Hx    Esophageal cancer Neg Hx    Rectal cancer Neg Hx    Social History   Socioeconomic History   Marital status: Widowed    Spouse name: Not on file   Number of children: 3   Years of education: Not on file   Highest education level: Not on file  Occupational History   Not on file  Tobacco Use   Smoking status: Never Smoker   Smokeless tobacco: Never Used  Vaping Use   Vaping Use: Never used  Substance and Sexual Activity   Alcohol use: No   Drug use: No   Sexual activity: Not Currently  Other Topics Concern   Not on file  Social History Narrative   Patient is right handed.   Patient drinks 1-2 cups caffeine daily.   Social Determinants of Health   Financial Resource Strain: Low Risk    Difficulty of Paying Living Expenses: Not hard at all  Food Insecurity: Unknown   Worried About Charity fundraiser in the Last Year: Never true   Arboriculturist in the Last Year: Not on file  Transportation Needs: No Transportation Needs   Lack of Transportation (Medical): No   Lack of Transportation (Non-Medical): No  Physical Activity: Sufficiently Active     Days of Exercise per Week: 3 days   Minutes of Exercise per Session: 60 min  Stress: No Stress Concern Present   Feeling of Stress : Not at all  Social Connections: Unknown   Frequency of Communication with Friends and Family: More than three times a week   Frequency of Social Gatherings with Friends and Family: More than three times a week   Attends Religious Services: Patient refused   Active Member of Clubs or Organizations: Patient refused   Attends Archivist Meetings: Patient refused   Marital Status: Widowed    Tobacco Counseling Counseling given: No   Clinical Intake:  Pre-visit preparation completed: Yes  Pain : No/denies pain Pain Score: 0-No pain     BMI - recorded: 25.6 Nutritional Status: BMI 25 -29 Overweight Nutritional Risks: None Diabetes: No  How often do you need to have someone help you when you read instructions, pamphlets, or other written materials from your doctor or pharmacy?: 1 - Never What is the last grade level you completed in school?: Bachelor's Degree  Diabetic? no  Interpreter Needed?: No  Information entered by :: Earvin Blazier N. Jonus Coble, LPN   Activities of Daily Living In your present state of health, do you have any difficulty performing the following activities: 09/25/2019  Hearing? Y  Comment wears hearing aids  Vision? N  Difficulty concentrating or making decisions? Y  Walking or climbing stairs? N  Dressing or bathing? N  Doing errands, shopping? N  Preparing Food and eating ? N  Using the Toilet? N  In the past six months, have you accidently leaked urine? N  Do you have problems with loss of bowel control? N  Managing your Medications? N  Managing your Finances? N  Housekeeping or managing your Housekeeping? N  Some recent data might be hidden  Patient Care Team: Plotnikov, Evie Lacks, MD as PCP - General Sueanne Margarita, MD as PCP - Cardiology (Cardiology) Deboraha Sprang, MD (Cardiology) Ladene Artist, MD (Gastroenterology) Kathrynn Ducking, MD as Consulting Physician (Neurology) Vicie Mutters, MD as Consulting Physician (Otolaryngology) Justin Mend, MD as Attending Physician (Internal Medicine) Sueanne Margarita, MD as Consulting Physician (Cardiology)  Indicate any recent Medical Services you may have received from other than [facility] providers in the past year (date may be approximate).     Assessment:   This is a routine wellness examination for [employer].  Hearing/Vision screen No exam data present  Dietary issues and exercise activities discussed: Current Exercise Habits: Home exercise routine (physical therapy), Type of exercise: Other - see comments (physical therapy), Time (Minutes): 60, Frequency (Times/Week): 3, Weekly Exercise (Minutes/Week): 180, Intensity: Moderate, Exercise limited by: None identified  Goals      Improve my nutrition,  increase my water intake,  and drink supplements like ensure or boost.       Depression Screen PHQ 2/9 Scores 09/25/2019 12/12/2017 09/23/2016 12/22/2015  PHQ - 2 Score 0 - 2 4  PHQ- 9 Score - - 11 8  Exception Documentation - Patient refusal - -    Fall Risk Fall Risk  09/25/2019 01/17/2019 12/12/2017 09/23/2016 12/22/2015  Falls in the past year? 0 0 No No No  Comment - Emmi Telephone Survey: data to providers prior to load - - -  Number falls in past yr: 0 - - - -  Injury with Fall? 0 - - - -  Risk for fall due to : No Fall Risks - - - -  Follow up Falls evaluation completed - - - -    Any stairs in or around the home? Yes  If so, are there any without handrails? No  Home free of loose throw rugs in walkways, pet beds, electrical cords, etc? Yes  Adequate lighting in your home to reduce risk of falls? Yes   ASSISTIVE DEVICES UTILIZED TO PREVENT FALLS:  Life alert? No  Use of a cane, walker or w/c? No  Grab bars in the bathroom? Yes  Shower chair or bench in shower? Yes  Elevated toilet seat or a handicapped toilet?  No   TIMED UP AND GO:  Was the test performed? No .  Length of time to ambulate 10 feet: 0 sec.   Gait steady and fast without use of assistive device  Cognitive Function: MMSE - Mini Mental State Exam 12/04/2018 09/23/2016  Orientation to time 5 5  Orientation to Place 5 5  Registration 3 3  Attention/ Calculation 5 4  Recall 3 1  Language- name 2 objects 2 2  Language- repeat 1 1  Language- follow 3 step command 3 3  Language- read & follow direction 1 1  Write a sentence 1 1  Copy design 0 1  Total score 29 27   Montreal Cognitive Assessment  03/28/2014  Visuospatial/ Executive (0/5) 5  Naming (0/3) 3  Attention: Read list of digits (0/2) 2  Attention: Read list of letters (0/1) 1  Attention: Serial 7 subtraction starting at 100 (0/3) 3  Language: Repeat phrase (0/2) 2  Language : Fluency (0/1) 0  Abstraction (0/2) 2  Delayed Recall (0/5) 5  Orientation (0/6) 6  Total 29   6CIT Screen 09/25/2019  What Year? 0 points  What month? 0 points  What time? 0 points  Count back from 20 0 points  Months in reverse 0 points  Repeat phrase 0 points  Total Score 0    Immunizations Immunization History  Administered Date(s) Administered   Fluad Quad(high Dose 65+) 12/04/2018   Influenza Split 02/12/2011, 11/23/2011   Influenza Whole 11/16/2007, 02/04/2009   Influenza, High Dose Seasonal PF 12/05/2012, 12/22/2015, 12/02/2016, 12/12/2017   Influenza,inj,Quad PF,6+ Mos 01/01/2014, 10/18/2014   PFIZER SARS-COV-2 Vaccination 04/05/2019, 04/28/2019   Pneumococcal Conjugate-13 06/07/2013   Pneumococcal Polysaccharide-23 12/22/2015   Tdap 07/25/2010    TDAP status: Up to date Flu Vaccine status: Up to date Pneumococcal vaccine status: Up to date Covid-19 vaccine status: Completed vaccines  Qualifies for Shingles Vaccine? Yes   Zostavax completed Yes   Shingrix Completed?: No.    Education has been provided regarding the importance of this vaccine. Patient has been advised  to call insurance company to determine out of pocket expense if they have not yet received this vaccine. Advised may also receive vaccine at local pharmacy or Health Dept. Verbalized acceptance and understanding.  Screening Tests Health Maintenance  Topic Date Due   INFLUENZA VACCINE  09/23/2019   TETANUS/TDAP  07/24/2020   COVID-19 Vaccine  Completed   PNA vac Low Risk Adult  Completed    Health Maintenance  Health Maintenance Due  Topic Date Due   INFLUENZA VACCINE  09/23/2019    Colorectal cancer screening: No longer required.   Lung Cancer Screening: (Low Dose CT Chest recommended if Age 28-80 years, 30 pack-year currently smoking OR have quit w/in 15years.) does not qualify.   Lung Cancer Screening Referral: no  Additional Screening:  Hepatitis C Screening: does not qualify; Completed no  Vision Screening: Recommended annual ophthalmology exams for early detection of glaucoma and other disorders of the eye. Is the patient up to date with their annual eye exam?  Yes  Who is the provider or what is the name of the office in which the patient attends annual eye exams? Marilynne Halsted with [facility]  If pt is not established with a provider, would they like to be referred to a provider to establish care? No .   Dental Screening: Recommended annual dental exams for proper oral hygiene  Community Resource Referral / Chronic Care Management: CRR required this visit?  No   CCM required this visit?  No      Plan:     I have personally reviewed and noted the following in the patient's chart:   Medical and social history Use of alcohol, tobacco or illicit drugs  Current medications and supplements Functional ability and status Nutritional status Physical activity Advanced directives List of other physicians Hospitalizations, surgeries, and ER visits in previous 12 months Vitals Screenings to include cognitive, depression, and  falls Referrals and appointments  In addition, I have reviewed and discussed with patient certain preventive protocols, quality metrics, and best practice recommendations. A written personalized care plan for preventive services as well as general preventive health recommendations were provided to patient.     Sheral Flow, LPN   08/27/2261   Nurse Notes: n/a  Medical screening examination/treatment/procedure(s) were performed by non-physician practitioner and as supervising physician I was immediately available for consultation/collaboration.  I agree with above. Lew Dawes, MD

## 2019-09-25 NOTE — Patient Instructions (Signed)
[name] , Thank you for taking time to come for your Medicare Wellness Visit. I appreciate your ongoing commitment to your health goals. Please review the following plan we discussed and let me know if I can assist you in the future.   Screening recommendations/referrals: Colonoscopy: no repeat due to age Recommended yearly ophthalmology/optometry visit for glaucoma screening and checkup Recommended yearly dental visit for hygiene and checkup  Vaccinations: Influenza vaccine: 12/04/2018; due every year Pneumococcal vaccine: completed Tdap vaccine: 07/25/2010; due every 2 years Shingles vaccine: never done   Covid-19: [employer] completed  Advanced directives: Please bring a copy of your health care power of attorney and living will to the office at your convenience.  Conditions/risks identified: Yes; Please continue to do your personal lifestyle choices by: daily care of teeth and gums, regular physical activity (goal should be 5 days a week for 30 minutes), eat a healthy diet, avoid tobacco and drug use, limiting any alcohol intake, taking a low-dose aspirin (if not allergic or have been advised by your provider otherwise) and taking vitamins and minerals as recommended by your provider. Continue doing brain stimulating activities (puzzles, reading, adult coloring books, staying active) to keep memory sharp. Continue to eat heart healthy diet (full of fruits, vegetables, whole grains, lean protein, water--limit salt, fat, and sugar intake) and increase physical activity as tolerated.  Next appointment: Please schedule your next Medicare Wellness Visit with your Nurse Health Advisor in 1 year.  Preventive Care 51 Years and Older, Male Preventive care refers to lifestyle choices and visits with your health care provider that can promote health and wellness. What does preventive care include?  A yearly physical exam. This is also called an annual well check.  Dental exams once or twice a  year.  Routine eye exams. Ask your health care provider how often you should have your eyes checked.  Personal lifestyle choices, including:  Daily care of your teeth and gums.  Regular physical activity.  Eating a healthy diet.  Avoiding tobacco and drug use.  Limiting alcohol use.  Practicing safe sex.  Taking low doses of aspirin every day.  Taking vitamin and mineral supplements as recommended by your health care provider. What happens during an annual well check? The services and screenings done by your health care provider during your annual well check will depend on your age, overall health, lifestyle risk factors, and family history of disease. Counseling  Your health care provider may ask you questions about your:  Alcohol use.  Tobacco use.  Drug use.  Emotional well-being.  Home and relationship well-being.  Sexual activity.  Eating habits.  History of falls.  Memory and ability to understand (cognition).  Work and work Statistician. Screening  You may have the following tests or measurements:  Height, weight, and BMI.  Blood pressure.  Lipid and cholesterol levels. These may be checked every 5 years, or more frequently if you are over 78 years old.  Skin check.  Lung cancer screening. You may have this screening every year starting at age 63 if you have a 30-pack-year history of smoking and currently smoke or have quit within the past 15 years.  Fecal occult blood test (FOBT) of the stool. You may have this test every year starting at age 67.  Flexible sigmoidoscopy or colonoscopy. You may have a sigmoidoscopy every 5 years or a colonoscopy every 10 years starting at age 52.  Prostate cancer screening. Recommendations will vary depending on your family history and other  risks.  Hepatitis C blood test.  Hepatitis B blood test.  Sexually transmitted disease (STD) testing.  Diabetes screening. This is done by checking your blood sugar  (glucose) after you have not eaten for a while (fasting). You may have this done every 1-3 years.  Abdominal aortic aneurysm (AAA) screening. You may need this if you are a current or former smoker.  Osteoporosis. You may be screened starting at age 39 if you are at high risk. Talk with your health care provider about your test results, treatment options, and if necessary, the need for more tests. Vaccines  Your health care provider may recommend certain vaccines, such as:  Influenza vaccine. This is recommended every year.  Tetanus, diphtheria, and acellular pertussis (Tdap, Td) vaccine. You may need a Td booster every 10 years.  Zoster vaccine. You may need this after age 65.  Pneumococcal 13-valent conjugate (PCV13) vaccine. One dose is recommended after age 64.  Pneumococcal polysaccharide (PPSV23) vaccine. One dose is recommended after age 22. Talk to your health care provider about which screenings and vaccines you need and how often you need them. This information is not intended to replace advice given to you by your health care provider. Make sure you discuss any questions you have with your health care provider. Document Released: 03/07/2015 Document Revised: 10/29/2015 Document Reviewed: 12/10/2014 Elsevier Interactive Patient Education  2017 [place] Prevention in the Home Falls can cause injuries. They can happen to people of all ages. There are many things you can do to make your home safe and to help prevent falls. What can I do on the outside of my home?  Regularly fix the edges of walkways and driveways and fix any cracks.  Remove anything that might make you trip as you walk through a door, such as a raised step or threshold.  Trim any bushes or trees on the path to your home.  Use bright outdoor lighting.  Clear any walking paths of anything that might make someone trip, such as rocks or tools.  Regularly check to see if handrails are loose or  broken. Make sure that both sides of any steps have handrails.  Any raised decks and porches should have guardrails on the edges.  Have any leaves, snow, or ice cleared regularly.  Use sand or salt on walking paths during winter.  Clean up any spills in your garage right away. This includes oil or grease spills. What can I do in the bathroom?  Use night lights.  Install grab bars by the toilet and in the tub and shower. Do not use towel bars as grab bars.  Use non-skid mats or decals in the tub or shower.  If you need to sit down in the shower, use a plastic, non-slip stool.  Keep the floor dry. Clean up any water that spills on the floor as soon as it happens.  Remove soap buildup in the tub or shower regularly.  Attach bath mats securely with double-sided non-slip rug tape.  Do not have throw rugs and other things on the floor that can make you trip. What can I do in the bedroom?  Use night lights.  Make sure that you have a light by your bed that is easy to reach.  Do not use any sheets or blankets that are too big for your bed. They should not hang down onto the floor.  Have a firm chair that has side arms. You can use this for support  while you get dressed.  Do not have throw rugs and other things on the floor that can make you trip. What can I do in the kitchen?  Clean up any spills right away.  Avoid walking on wet floors.  Keep items that you use a lot in easy-to-reach places.  If you need to reach something above you, use a strong step stool that has a grab bar.  Keep electrical cords out of the way.  Do not use floor polish or wax that makes floors slippery. If you must use wax, use non-skid floor wax.  Do not have throw rugs and other things on the floor that can make you trip. What can I do with my stairs?  Do not leave any items on the stairs.  Make sure that there are handrails on both sides of the stairs and use them. Fix handrails that are  broken or loose. Make sure that handrails are as long as the stairways.  Check any carpeting to make sure that it is firmly attached to the stairs. Fix any carpet that is loose or worn.  Avoid having throw rugs at the top or bottom of the stairs. If you do have throw rugs, attach them to the floor with carpet tape.  Make sure that you have a light switch at the top of the stairs and the bottom of the stairs. If you do not have them, ask someone to add them for you. What else can I do to help prevent falls?  Wear shoes that:  Do not have high heels.  Have rubber bottoms.  Are comfortable and fit you well.  Are closed at the toe. Do not wear sandals.  If you use a stepladder:  Make sure that it is fully opened. Do not climb a closed stepladder.  Make sure that both sides of the stepladder are locked into place.  Ask someone to hold it for you, if possible.  Clearly mark and make sure that you can see:  Any grab bars or handrails.  First and last steps.  Where the edge of each step is.  Use tools that help you move around (mobility aids) if they are needed. These include:  Canes.  Walkers.  Scooters.  Crutches.  Turn on the lights when you go into a dark area. Replace any light bulbs as soon as they burn out.  Set up your furniture so you have a clear path. Avoid moving your furniture around.  If any of your floors are uneven, fix them.  If there are any pets around you, be aware of where they are.  Review your medicines with your doctor. Some medicines can make you feel dizzy. This can increase your chance of falling. Ask your doctor what other things that you can do to help prevent falls. This information is not intended to replace advice given to you by your health care provider. Make sure you discuss any questions you have with your health care provider. Document Released: 12/05/2008 Document Revised: 07/17/2015 Document Reviewed: 03/15/2014 Elsevier  Interactive Patient Education  2017 [employer].

## 2019-09-25 NOTE — Assessment & Plan Note (Signed)
Increase Wellbutrin to 300 mg/d, change to XR for compliance

## 2019-09-25 NOTE — Assessment & Plan Note (Signed)
Worse - CBG 183 (ate at 1:15 pm) Check A1c Cut back on carbs

## 2019-09-25 NOTE — Progress Notes (Signed)
Subjective:  Patient ID: Kenneth Williams, male    DOB: [birth year]  Age: 84 y.o. MRN: [id number]  CC: No chief complaint on file.   HPI Kenneth Williams presents for depression, apathy - worse C/o fatigue  Outpatient Medications Prior to Visit  Medication Sig Dispense Refill   acetaminophen (TYLENOL) 500 MG tablet Take 500 mg by mouth 2 (two) times daily as needed for mild pain.     buPROPion (WELLBUTRIN SR) 150 MG 12 hr tablet TAKE 1 TABLET BY MOUTH EVERY DAY 90 tablet 0   cholecalciferol (VITAMIN D) 1000 UNITS tablet Take 1,000 Units by mouth daily.      cloNIDine (CATAPRES) 0.1 MG tablet TAKE 1 TABLET (0.1 MG TOTAL) BY MOUTH 3 (THREE) TIMES DAILY AS NEEDED (TAKE FOR SYSTOLIC BP GREATER THAN 419). 270 tablet 1   colchicine 0.6 MG tablet TAKE 1 TABLET (0.6 MG TOTAL) BY MOUTH 3 (THREE) TIMES DAILY AS NEEDED. FOR GOUT 60 tablet 2   Cyanocobalamin (VITAMIN B 12 PO) Take 1 tablet by mouth daily.       ezetimibe (ZETIA) 10 MG tablet Take 1 tablet (10 mg total) by mouth daily. 90 tablet 3   fluticasone (FLONASE) 50 MCG/ACT nasal spray Place 2 sprays into both nostrils daily. 16 g 6   furosemide (LASIX) 20 MG tablet Take 20 mg by mouth daily.     hydrALAZINE (APRESOLINE) 25 MG tablet Take 25 mg by mouth 2 (two) times daily.     labetalol (NORMODYNE) 100 MG tablet Take 100 mg by mouth 2 (two) times daily.     levothyroxine (SYNTHROID) 88 MCG tablet TAKE 1 TABLET (88 MCG TOTAL) BY MOUTH DAILY. KEEP SCHEDULED APPT NEED TSH CHECK FOR FUTURE REFILLS 90 tablet 0   polyethylene glycol (MIRALAX / GLYCOLAX) packet Take 17 g by mouth daily as needed for mild constipation. 14 each 0   rosuvastatin (CRESTOR) 40 MG tablet Take 1 tablet (40 mg total) by mouth daily. 90 tablet 3   sodium bicarbonate 325 MG tablet Take 325 mg by mouth 4 (four) times daily.     No facility-administered medications prior to visit.    ROS: Review of Systems  Constitutional: Positive for fatigue. Negative for appetite change  and unexpected weight change.  HENT: Negative for congestion, nosebleeds, sneezing, sore throat and trouble swallowing.   Eyes: Negative for itching and visual disturbance.  Respiratory: Negative for cough.   Cardiovascular: Negative for chest pain, palpitations and leg swelling.  Gastrointestinal: Negative for abdominal distention, blood in stool, diarrhea and nausea.  Genitourinary: Negative for frequency and hematuria.  Musculoskeletal: Negative for back pain, gait problem, joint swelling and neck pain.  Skin: Negative for rash.  Neurological: Negative for dizziness, tremors, speech difficulty and weakness.  Psychiatric/Behavioral: Positive for decreased concentration and dysphoric mood. Negative for agitation, sleep disturbance and suicidal ideas. The patient is not nervous/anxious.     Objective:  There were no vitals taken for this visit.  BP Readings from Last 3 Encounters:  09/25/19 130/80  08/15/19 (!) 150/78  06/26/19 (!) 148/78    Wt Readings from Last 3 Encounters:  09/25/19 194 lb (88 kg)  08/15/19 195 lb 12.8 oz (88.8 kg)  06/26/19 191 lb (86.6 kg)    Physical Exam Constitutional:      General: He is not in acute distress.    Appearance: He is well-developed.     Comments: NAD  Eyes:     Conjunctiva/sclera: Conjunctivae normal.  Pupils: Pupils are equal, round, and reactive to light.  Neck:     Thyroid: No thyromegaly.     Vascular: No JVD.  Cardiovascular:     Rate and Rhythm: Normal rate and regular rhythm.     Heart sounds: Normal heart sounds. No murmur heard.  No friction rub. No gallop.   Pulmonary:     Effort: Pulmonary effort is normal. No respiratory distress.     Breath sounds: Normal breath sounds. No wheezing or rales.  Chest:     Chest wall: No tenderness.  Abdominal:     General: Bowel sounds are normal. There is no distension.     Palpations: Abdomen is soft. There is no mass.     Tenderness: There is no abdominal tenderness. There is  no guarding or rebound.  Musculoskeletal:        General: No tenderness. Normal range of motion.     Cervical back: Normal range of motion.  Lymphadenopathy:     Cervical: No cervical adenopathy.  Skin:    General: Skin is warm and dry.     Findings: No rash.  Neurological:     Mental Status: He is alert and oriented to person, place, and time.     Cranial Nerves: No cranial nerve deficit.     Motor: No abnormal muscle tone.     Coordination: Coordination normal.     Gait: Gait normal.     Deep Tendon Reflexes: Reflexes are normal and symmetric.  Psychiatric:        Behavior: Behavior normal.        Thought Content: Thought content normal.        Judgment: Judgment normal.       CBG 183 (ate at 1:15 pm)  A complex case FTF>45 min  Lab Results  Component Value Date   WBC 9.7 01/25/2018   HGB 12.6 (L) 01/25/2018   HCT 37.9 (L) 01/25/2018   PLT 264.0 01/25/2018   GLUCOSE 97 01/25/2018   CHOL 112 04/18/2019   TRIG 111 04/18/2019   HDL 47 04/18/2019   LDLDIRECT 123.0 03/25/2014   LDLCALC 45 04/18/2019   ALT 28 04/18/2019   AST 11 09/29/2018   NA 139 01/25/2018   K 4.6 01/25/2018   CL 109 01/25/2018   CREATININE 2.34 (H) 01/25/2018   BUN 27 (H) 01/25/2018   CO2 22 01/25/2018   TSH 1.58 01/25/2018   PSA 1.73 05/18/2016   INR 1.03 09/13/2017    MR BRAIN WO CONTRAST  Result Date: 12/29/2018  [facility] [address], [place], [place] [address] [phone] NEUROIMAGING REPORT STUDY DATE: 06[birth year]1937 PATIENT NAME: ZAYDON KINSER DOB: Nov 05, 1935 MRN: [id number] EXAM: MRI Brain without contrast ORDERING CLINICIAN: Kathrynn Ducking, MD CLINICAL HISTORY: 84 year old man with memory loss COMPARISON FILMS: MRI 02/27/2004. TECHNIQUE: MRI of the brain without contrast was obtained utilizing 5 mm axial slices with T1, T2, T2 flair, SWI and diffusion weighted views.  T1 sagittal and T2 coronal views were obtained. CONTRAST: none IMAGING SITE:   imaging, [place], [place] FINDINGS: On sagittal images, the spinal cord is imaged caudally to C3 and is normal in caliber.   The contents of the posterior fossa are of normal size and position.   The pituitary gland and optic chiasm appear normal.    There is moderate generalized cortical atrophy, progressed compared to the MRI from 2006.  There are no abnormal extra-axial collections of fluid.  There are T2/flair hyperintense  foci in the pons and in the hemispheres, predominantly in the deep white matter.  None of these appear to be acute.  This has progressed compared to the 2006 MRI.  The deep gray matter appears normal.  Diffusion weighted images are normal.  Susceptibility weighted images are normal.  The VIIth/VIIIth nerve complex appears normal.  There is a mild right mastoid effusion.  The left mastoid air cells appear normal.  Mucous retention cyst in the posterior ethmoid air cell on the right.  The other paranasal sinuses appear normal.  There have been bilateral cataract extractions since 2006.  The orbits are otherwise normal.  Flow voids are identified within the major intracerebral arteries.     This MRI of the brain without contrast shows the following: 1.   Moderate generalized cortical atrophy. 2.   Mild chronic microvascular ischemic changes involving the pons and hemispheres 3.   Minimal right mastoid effusion that could be due to eustachian tube dysfunction.  4.   There are no acute findings. INTERPRETING PHYSICIAN: Richard A. Felecia Shelling, MD, PhD, FAAN Certified in  Neuroimaging by [employer]    Assessment & Plan:    Walker Kehr, MD

## 2019-09-25 NOTE — Patient Instructions (Signed)
Radon test for basement

## 2019-09-25 NOTE — Assessment & Plan Note (Signed)
Worse Increase Wellbutrin to 300 mg/d, change to XR for compliance

## 2019-09-26 LAB — CBC WITH DIFFERENTIAL/PLATELET
Absolute Monocytes: 771 cells/uL (ref 200–950)
Basophils Absolute: 38 cells/uL (ref 0–200)
Basophils Relative: 0.4 %
Eosinophils Absolute: 461 cells/uL (ref 15–500)
Eosinophils Relative: 4.9 %
HCT: 36.5 % — ABNORMAL LOW (ref 38.5–50.0)
Hemoglobin: 11.8 g/dL — ABNORMAL LOW (ref 13.2–17.1)
Lymphs Abs: 1466 cells/uL (ref 850–3900)
MCH: 29.1 pg (ref 27.0–33.0)
MCHC: 32.3 g/dL (ref 32.0–36.0)
MCV: 89.9 fL (ref 80.0–100.0)
MPV: 10.8 fL (ref 7.5–12.5)
Monocytes Relative: 8.2 %
Neutro Abs: 6665 cells/uL (ref 1500–7800)
Neutrophils Relative %: 70.9 %
Platelets: 255 10*3/uL (ref 140–400)
RBC: 4.06 10*6/uL — ABNORMAL LOW (ref 4.20–5.80)
RDW: 11.8 % (ref 11.0–15.0)
Total Lymphocyte: 15.6 %
WBC: 9.4 10*3/uL (ref 3.8–10.8)

## 2019-09-26 LAB — T4, FREE: Free T4: 1.3 ng/dL (ref 0.8–1.8)

## 2019-09-26 LAB — HEMOGLOBIN A1C
Hgb A1c MFr Bld: 5 % of total Hgb (ref <5.7)
Mean Plasma Glucose: 97 (calc)
eAG (mmol/L): 5.4 (calc)

## 2019-09-26 LAB — TSH: TSH: 1.93 mIU/L (ref 0.40–4.50)

## 2019-10-01 LAB — ACETYLCHOLINE RECEPTOR AB, ALL
AChR Binding Ab, Serum: <0.03 nmol/L (ref 0.00–0.24)
Acetylchol Block Ab: 18 % (ref 0–25)
Acetylcholine Modulat Ab: <12 % (ref 0–20)

## 2019-11-06 ENCOUNTER — Ambulatory Visit: Payer: PPO | Admitting: Internal Medicine

## 2019-11-07 DIAGNOSIS — E213 Hyperparathyroidism, unspecified: Secondary | ICD-10-CM | POA: Diagnosis not present

## 2019-11-07 DIAGNOSIS — F329 Major depressive disorder, single episode, unspecified: Secondary | ICD-10-CM | POA: Diagnosis not present

## 2019-11-07 DIAGNOSIS — E039 Hypothyroidism, unspecified: Secondary | ICD-10-CM | POA: Diagnosis not present

## 2019-11-07 DIAGNOSIS — I129 Hypertensive chronic kidney disease with stage 1 through stage 4 chronic kidney disease, or unspecified chronic kidney disease: Secondary | ICD-10-CM | POA: Diagnosis not present

## 2019-11-07 DIAGNOSIS — N184 Chronic kidney disease, stage 4 (severe): Secondary | ICD-10-CM | POA: Diagnosis not present

## 2019-11-07 DIAGNOSIS — H9110 Presbycusis, unspecified ear: Secondary | ICD-10-CM | POA: Diagnosis not present

## 2019-11-07 DIAGNOSIS — D631 Anemia in chronic kidney disease: Secondary | ICD-10-CM | POA: Diagnosis not present

## 2019-11-07 DIAGNOSIS — M109 Gout, unspecified: Secondary | ICD-10-CM | POA: Diagnosis not present

## 2019-11-07 DIAGNOSIS — Z23 Encounter for immunization: Secondary | ICD-10-CM | POA: Diagnosis not present

## 2019-11-14 ENCOUNTER — Other Ambulatory Visit: Payer: Self-pay

## 2019-11-14 ENCOUNTER — Encounter: Payer: Self-pay | Admitting: Internal Medicine

## 2019-11-14 ENCOUNTER — Ambulatory Visit (INDEPENDENT_AMBULATORY_CARE_PROVIDER_SITE_OTHER): Payer: PPO | Admitting: Internal Medicine

## 2019-11-14 DIAGNOSIS — I10 Essential (primary) hypertension: Secondary | ICD-10-CM | POA: Diagnosis not present

## 2019-11-14 DIAGNOSIS — R634 Abnormal weight loss: Secondary | ICD-10-CM | POA: Diagnosis not present

## 2019-11-14 DIAGNOSIS — J029 Acute pharyngitis, unspecified: Secondary | ICD-10-CM

## 2019-11-14 DIAGNOSIS — I951 Orthostatic hypotension: Secondary | ICD-10-CM

## 2019-11-14 DIAGNOSIS — I2510 Atherosclerotic heart disease of native coronary artery without angina pectoris: Secondary | ICD-10-CM | POA: Diagnosis not present

## 2019-11-14 MED ORDER — AZITHROMYCIN 250 MG PO TABS: ORAL_TABLET | ORAL | 0 refills | Status: DC

## 2019-11-14 NOTE — Assessment & Plan Note (Signed)
ST on R when swallowing x 6 wks, hoarse, phlegm Empiric abx ENT ref if not better

## 2019-11-14 NOTE — Patient Instructions (Signed)
Kenneth Williams started vaccine booster sign up. Please call [facility] at [phone]. You can also call the venue where you had your initial COVID 19 vaccination.   

## 2019-11-14 NOTE — Assessment & Plan Note (Signed)
Doing well 

## 2019-11-14 NOTE — Assessment & Plan Note (Signed)
No relapse 

## 2019-11-14 NOTE — Assessment & Plan Note (Signed)
Pravastatin, ASA 

## 2019-11-14 NOTE — Progress Notes (Signed)
Subjective:  Patient ID: Kenneth Williams, male    DOB: [birth year]  Age: 84 y.o. MRN: [id number]  CC: No chief complaint on file.   HPI Kenneth Williams presents for depression, apathy, anemia, CRF C/o ST on R when swallowing x 6 wks, hoarse, phlegm  Outpatient Medications Prior to Visit  Medication Sig Dispense Refill  . acetaminophen (TYLENOL) 500 MG tablet Take 500 mg by mouth 2 (two) times daily as needed for mild pain.    Marland Kitchen aspirin 81 MG chewable tablet Chew 81 mg by mouth daily. Take 2 per day    . buPROPion (WELLBUTRIN XL) 300 MG 24 hr tablet Take 1 tablet (300 mg total) by mouth daily. 90 tablet 3  . cholecalciferol (VITAMIN D) 1000 UNITS tablet Take 1,000 Units by mouth daily.     . cloNIDine (CATAPRES) 0.1 MG tablet TAKE 1 TABLET (0.1 MG TOTAL) BY MOUTH 3 (THREE) TIMES DAILY AS NEEDED (TAKE FOR SYSTOLIC BP GREATER THAN 409). 270 tablet 1  . colchicine 0.6 MG tablet TAKE 1 TABLET (0.6 MG TOTAL) BY MOUTH 3 (THREE) TIMES DAILY AS NEEDED. FOR GOUT 60 tablet 2  . Cyanocobalamin (VITAMIN B 12 PO) Take 1 tablet by mouth daily.      Marland Kitchen ezetimibe (ZETIA) 10 MG tablet Take 1 tablet (10 mg total) by mouth daily. 90 tablet 3  . fluticasone (FLONASE) 50 MCG/ACT nasal spray Place 2 sprays into both nostrils daily. 16 g 6  . furosemide (LASIX) 20 MG tablet Take 20 mg by mouth daily.    . hydrALAZINE (APRESOLINE) 25 MG tablet Take 25 mg by mouth 2 (two) times daily.    Marland Kitchen labetalol (NORMODYNE) 100 MG tablet Take 100 mg by mouth 2 (two) times daily.    Marland Kitchen levothyroxine (SYNTHROID) 88 MCG tablet TAKE 1 TABLET (88 MCG TOTAL) BY MOUTH DAILY. KEEP SCHEDULED APPT NEED TSH CHECK FOR FUTURE REFILLS 90 tablet 0  . polyethylene glycol (MIRALAX / GLYCOLAX) packet Take 17 g by mouth daily as needed for mild constipation. 14 each 0  . sodium bicarbonate 325 MG tablet Take 325 mg by mouth 4 (four) times daily.    . rosuvastatin (CRESTOR) 40 MG tablet Take 1 tablet (40 mg total) by mouth daily. 90 tablet 3   No  facility-administered medications prior to visit.    ROS: Review of Systems  Constitutional: Negative for appetite change, fatigue and unexpected weight change.  HENT: Positive for hearing loss, postnasal drip, sore throat and voice change. Negative for congestion, dental problem, nosebleeds, sneezing and trouble swallowing.   Eyes: Negative for itching and visual disturbance.  Respiratory: Negative for cough.   Cardiovascular: Negative for chest pain, palpitations and leg swelling.  Gastrointestinal: Negative for abdominal distention, blood in stool, diarrhea and nausea.  Genitourinary: Negative for frequency and hematuria.  Musculoskeletal: Negative for back pain, gait problem, joint swelling and neck pain.  Skin: Negative for rash.  Neurological: Negative for dizziness, tremors, speech difficulty and weakness.  Hematological: Does not bruise/bleed easily.  Psychiatric/Behavioral: Negative for agitation, dysphoric mood and sleep disturbance. The patient is not nervous/anxious.     Objective:  BP 140/86   Pulse 67   Temp 97.7 F (36.5 C) (Oral)   Ht 6\' 1"  (1.854 m)   Wt 191 lb (86.6 kg)   SpO2 97%   BMI 25.20 kg/m   BP Readings from Last 3 Encounters:  11/14/19 140/86  09/25/19 130/80  08/15/19 (!) 150/78    Wt Readings from  Last 3 Encounters:  11/14/19 191 lb (86.6 kg)  09/25/19 194 lb (88 kg)  08/15/19 195 lb 12.8 oz (88.8 kg)    Physical Exam Constitutional:      General: He is not in acute distress.    Appearance: He is well-developed.     Comments: NAD  Eyes:     Conjunctiva/sclera: Conjunctivae normal.     Pupils: Pupils are equal, round, and reactive to light.  Neck:     Thyroid: No thyromegaly.     Vascular: No JVD.  Cardiovascular:     Rate and Rhythm: Normal rate and regular rhythm.     Heart sounds: Normal heart sounds. No murmur heard.  No friction rub. No gallop.   Pulmonary:     Effort: Pulmonary effort is normal. No respiratory distress.      Breath sounds: Normal breath sounds. No wheezing or rales.  Chest:     Chest wall: No tenderness.  Abdominal:     General: Bowel sounds are normal. There is no distension.     Palpations: Abdomen is soft. There is no mass.     Tenderness: There is no abdominal tenderness. There is no guarding or rebound.  Musculoskeletal:        General: No tenderness. Normal range of motion.     Cervical back: Normal range of motion.  Lymphadenopathy:     Cervical: No cervical adenopathy.  Skin:    General: Skin is warm and dry.     Findings: No rash.  Neurological:     Mental Status: He is alert and oriented to person, place, and time.     Cranial Nerves: No cranial nerve deficit.     Motor: No abnormal muscle tone.     Coordination: Coordination normal.     Gait: Gait abnormal.     Deep Tendon Reflexes: Reflexes are normal and symmetric.  Psychiatric:        Behavior: Behavior normal.        Thought Content: Thought content normal.        Judgment: Judgment normal.     Hard hearing  Throat - no lesions. Neck - no mass  Lab Results  Component Value Date   WBC 9.4 09/25/2019   HGB 11.8 (L) 09/25/2019   HCT 36.5 (L) 09/25/2019   PLT 255 09/25/2019   GLUCOSE 88 09/25/2019   CHOL 112 04/18/2019   TRIG 111 04/18/2019   HDL 47 04/18/2019   LDLDIRECT 123.0 03/25/2014   LDLCALC 45 04/18/2019   ALT 28 04/18/2019   AST 11 09/29/2018   NA 140 09/25/2019   K 4.4 09/25/2019   CL 106 09/25/2019   CREATININE 4.08 (H) 09/25/2019   BUN 41 (H) 09/25/2019   CO2 22 09/25/2019   TSH 1.93 09/25/2019   PSA 1.73 05/18/2016   INR 1.03 09/13/2017   HGBA1C 5.0 09/25/2019    MR BRAIN WO CONTRAST  Result Date: 12/29/2018  [facility] [address], [place], [place] [address] [phone] NEUROIMAGING REPORT STUDY DATE: 1935-08-08 PATIENT NAME: CHANAN DETWILER DOB: 1935/12/02 MRN: [id number] EXAM: MRI Brain without contrast ORDERING CLINICIAN: Kathrynn Ducking, MD CLINICAL  HISTORY: 83 year old man with memory loss COMPARISON FILMS: MRI 02/27/2004. TECHNIQUE: MRI of the brain without contrast was obtained utilizing 5 mm axial slices with T1, T2, T2 flair, SWI and diffusion weighted views.  T1 sagittal and T2 coronal views were obtained. CONTRAST: none IMAGING SITE: [facility], [place], [place] FINDINGS: On  sagittal images, the spinal cord is imaged caudally to C3 and is normal in caliber.   The contents of the posterior fossa are of normal size and position.   The pituitary gland and optic chiasm appear normal.    There is moderate generalized cortical atrophy, progressed compared to the MRI from 2006.  There are no abnormal extra-axial collections of fluid.  There are T2/flair hyperintense foci in the pons and in the hemispheres, predominantly in the deep white matter.  None of these appear to be acute.  This has progressed compared to the 2006 MRI.  The deep gray matter appears normal.  Diffusion weighted images are normal.  Susceptibility weighted images are normal.  The VIIth/VIIIth nerve complex appears normal.  There is a mild right mastoid effusion.  The left mastoid air cells appear normal.  Mucous retention cyst in the posterior ethmoid air cell on the right.  The other paranasal sinuses appear normal.  There have been bilateral cataract extractions since 2006.  The orbits are otherwise normal.  Flow voids are identified within the major intracerebral arteries.     This MRI of the brain without contrast shows the following: 1.   Moderate generalized cortical atrophy. 2.   Mild chronic microvascular ischemic changes involving the pons and hemispheres 3.   Minimal right mastoid effusion that could be due to eustachian tube dysfunction.  4.   There are no acute findings. INTERPRETING PHYSICIAN: Richard A. Felecia Shelling, MD, PhD, FAAN Certified in  Neuroimaging by [employer]    Assessment & Plan:   There are no diagnoses linked to this  encounter.   No orders of the defined types were placed in this encounter.    Follow-up: No follow-ups on file.  Walker Kehr, MD

## 2019-11-14 NOTE — Assessment & Plan Note (Signed)
Wt Readings from Last 3 Encounters:  11/14/19 191 lb (86.6 kg)  09/25/19 194 lb (88 kg)  08/15/19 195 lb 12.8 oz (88.8 kg)

## 2019-11-25 ENCOUNTER — Other Ambulatory Visit: Payer: Self-pay | Admitting: Cardiology

## 2019-12-14 ENCOUNTER — Other Ambulatory Visit: Payer: Self-pay | Admitting: Internal Medicine

## 2020-01-02 DIAGNOSIS — M542 Cervicalgia: Secondary | ICD-10-CM | POA: Diagnosis not present

## 2020-01-09 DIAGNOSIS — E872 Acidosis: Secondary | ICD-10-CM | POA: Diagnosis not present

## 2020-01-09 DIAGNOSIS — E039 Hypothyroidism, unspecified: Secondary | ICD-10-CM | POA: Diagnosis not present

## 2020-01-09 DIAGNOSIS — N184 Chronic kidney disease, stage 4 (severe): Secondary | ICD-10-CM | POA: Diagnosis not present

## 2020-01-09 DIAGNOSIS — M109 Gout, unspecified: Secondary | ICD-10-CM | POA: Diagnosis not present

## 2020-01-09 DIAGNOSIS — D631 Anemia in chronic kidney disease: Secondary | ICD-10-CM | POA: Diagnosis not present

## 2020-01-09 DIAGNOSIS — N189 Chronic kidney disease, unspecified: Secondary | ICD-10-CM | POA: Diagnosis not present

## 2020-01-09 DIAGNOSIS — I129 Hypertensive chronic kidney disease with stage 1 through stage 4 chronic kidney disease, or unspecified chronic kidney disease: Secondary | ICD-10-CM | POA: Diagnosis not present

## 2020-01-09 DIAGNOSIS — E213 Hyperparathyroidism, unspecified: Secondary | ICD-10-CM | POA: Diagnosis not present

## 2020-01-12 DIAGNOSIS — M542 Cervicalgia: Secondary | ICD-10-CM | POA: Diagnosis not present

## 2020-01-14 ENCOUNTER — Other Ambulatory Visit: Payer: Self-pay | Admitting: Cardiology

## 2020-01-14 NOTE — Telephone Encounter (Signed)
rx refill

## 2020-01-21 ENCOUNTER — Telehealth: Payer: PPO | Admitting: Internal Medicine

## 2020-01-24 ENCOUNTER — Encounter (HOSPITAL_COMMUNITY): Payer: PPO

## 2020-01-29 ENCOUNTER — Other Ambulatory Visit (HOSPITAL_COMMUNITY): Payer: Self-pay | Admitting: *Deleted

## 2020-01-30 ENCOUNTER — Ambulatory Visit (HOSPITAL_COMMUNITY)
Admission: RE | Admit: 2020-01-30 | Discharge: 2020-01-30 | Disposition: A | Discharge status: Home / Self Care | Payer: PPO | Source: Ambulatory Visit | Attending: Internal Medicine | Admitting: Internal Medicine

## 2020-01-30 ENCOUNTER — Other Ambulatory Visit: Payer: Self-pay

## 2020-01-30 DIAGNOSIS — D631 Anemia in chronic kidney disease: Secondary | ICD-10-CM | POA: Insufficient documentation

## 2020-01-30 DIAGNOSIS — N184 Chronic kidney disease, stage 4 (severe): Secondary | ICD-10-CM | POA: Insufficient documentation

## 2020-01-30 MED ORDER — SODIUM CHLORIDE 0.9 % IV SOLN
510.0000 mg | INTRAVENOUS | Status: DC
  Administered 2020-01-30: 510 mg via INTRAVENOUS
  Filled 2020-01-30: qty 510

## 2020-01-30 NOTE — Discharge Instructions (Signed)

## 2020-02-06 ENCOUNTER — Encounter (HOSPITAL_COMMUNITY)
Admission: RE | Admit: 2020-02-06 | Discharge: 2020-02-06 | Disposition: A | Discharge status: Home / Self Care | Payer: PPO | Source: Ambulatory Visit | Attending: Internal Medicine | Admitting: Internal Medicine

## 2020-02-06 DIAGNOSIS — N184 Chronic kidney disease, stage 4 (severe): Secondary | ICD-10-CM | POA: Insufficient documentation

## 2020-02-06 DIAGNOSIS — D631 Anemia in chronic kidney disease: Secondary | ICD-10-CM | POA: Insufficient documentation

## 2020-02-06 DIAGNOSIS — N400 Enlarged prostate without lower urinary tract symptoms: Secondary | ICD-10-CM | POA: Diagnosis not present

## 2020-02-06 DIAGNOSIS — N453 Epididymo-orchitis: Secondary | ICD-10-CM | POA: Diagnosis not present

## 2020-02-06 MED ORDER — SODIUM CHLORIDE 0.9 % IV SOLN
510.0000 mg | INTRAVENOUS | Status: DC
  Administered 2020-02-06: 13:00:00 510 mg via INTRAVENOUS
  Filled 2020-02-06: qty 17

## 2020-02-13 ENCOUNTER — Ambulatory Visit: Payer: PPO | Admitting: Internal Medicine

## 2020-02-18 ENCOUNTER — Encounter: Payer: Self-pay | Admitting: Cardiology

## 2020-02-18 ENCOUNTER — Other Ambulatory Visit: Payer: Self-pay

## 2020-02-18 ENCOUNTER — Ambulatory Visit: Payer: PPO | Admitting: Cardiology

## 2020-02-18 VITALS — Ht 73.0 in | Wt 193.6 lb

## 2020-02-18 DIAGNOSIS — E785 Hyperlipidemia, unspecified: Secondary | ICD-10-CM | POA: Diagnosis not present

## 2020-02-18 DIAGNOSIS — I6523 Occlusion and stenosis of bilateral carotid arteries: Secondary | ICD-10-CM | POA: Diagnosis not present

## 2020-02-18 DIAGNOSIS — I10 Essential (primary) hypertension: Secondary | ICD-10-CM | POA: Diagnosis not present

## 2020-02-18 DIAGNOSIS — I2510 Atherosclerotic heart disease of native coronary artery without angina pectoris: Secondary | ICD-10-CM | POA: Diagnosis not present

## 2020-02-18 DIAGNOSIS — I951 Orthostatic hypotension: Secondary | ICD-10-CM

## 2020-02-18 DIAGNOSIS — R0602 Shortness of breath: Secondary | ICD-10-CM | POA: Diagnosis not present

## 2020-02-18 NOTE — Patient Instructions (Signed)
Medication Instructions:  Your physician recommends that you continue on your current medications as directed. Please refer to the Current Medication list given to you today.  *If you need a refill on your cardiac medications before your next appointment, please call your pharmacy*  Follow-Up: At [facility], you and your health needs are our priority.  As part of our continuing mission to provide you with exceptional heart care, we have created designated Provider Care Teams.  These Care Teams include your primary Cardiologist (physician) and Advanced Practice Providers (APPs -  Physician Assistants and Nurse Practitioners) who all work together to provide you with the care you need, when you need it.   Your next appointment:   6 month(s)  The format for your next appointment:   In Person  Provider:   You may see Fransico Him, MD or one of the following Advanced Practice Providers on your designated Care Team:    Melina Copa, PA-C  Ermalinda Barrios, PA-C   Other Instructions Dr. [name] would like for you to get thigh high compression hose and an abdominal binder. Please take prescription given to you today to a medical supply store to be fitted.

## 2020-02-18 NOTE — Progress Notes (Signed)
Cardiology Office Note:    Date:  02/18/2020   ID:  Kenneth Williams, DOB 21-Mar-1935, MRN [id number]  PCP:  Cassandria Anger, MD  Cardiologist:  Fransico Him, MD    Referring MD: Cassandria Anger, MD   Chief Complaint  Patient presents with   Coronary Artery Disease   Hyperlipidemia    History of Present Illness:    Kenneth Williams is a 84 y.o. male with a hx of CAD with nonobstructive CAD by cath in 2011, hyperlipidemia, hypertension, orthostatic hypotension and elevated calcium score of 727. Due to DOE a 2D echo was done showing normal LVF with G1 DD.  Nuclear stress test showed no ischemia.  He tells me that his renal function has continued to decline and is likely heading towards HD.  He is getting iron infusions for anemia. He has not been wearing the medical compression hose. He did not follow through in getting the abdominal binder.    He is here today for followup and is doing well.  He continues to have significant dizziness when standing.  He is limited with his activity because he gets very weak when he is up and can hardly stand up too long without having to sit.  He has chronic DOE with climbing stairs that has not changed.  He denies any chest pain or pressure,  PND, orthopnea, LE edema, palpitations or syncope. He continues to have problems with orthostatic hypotension although his main complaint is that he is losing muscle mass, weight and that his shoulder and neck muscles seem to be getting weaker.  He is compliant with his meds and is tolerating meds with no SE.    Past Medical History:  Diagnosis Date   Actinic keratosis 10/22/2009   Face  4/17 neck, hand 11/20 nose   Acute upper respiratory infection 05/05/2010   2/17      Blepharitis of both eyes 03/02/2018   2020 eryth oint   BPH (benign prostatic hyperplasia)    CAD (coronary artery disease)    cath 2011-mild non obstructive, coronary artery calcium score of 727 on CT 04/13/2018   Carotid artery  stenosis 02/29/2012   2013 B 0-39% q 24 mo    Coronary artery disease 04/03/2018   2/20 coronary calcium score is 727. It is consistent with a extensive degree of coronary atherosclerosis.  Pravastatin, ASA Cardiol - Dr [name]   CRF (chronic renal failure), stage 5 ([place]) 08/14/2009   Chronic stage 4-5 Dr Johnney Ou The patient underwent a TURP on 11/04/2017 S/p nephrology consult 2019 SBP 130-140 at home   Diverticulitis of colon 10/13/2006   Dr Fuller Plan Colon 2014    DOE (dyspnea on exertion) 03/03/2017   1/19   Dyslipidemia 07/21/2016   5/18 D/c Simvastatin due to potential drug interactions 2018 Start Lipitor 11/18Stopped Lipitor - diarrhea stopped and then came back again... Off Lipitor now 2019 Livalo - too $$$: try Pravachol 12/19 CT ca scoring option discussed   Dyspnea    with activity   E coli bacteremia    Elevated PSA    history of    Enlarged prostate with urinary retention 11/04/2017   Gout    2009   Gout 07/21/2016   Recurrent    History of diverticulitis of colon    History of pancreatitis    History of TIAs    HYPERKALEMIA 10/22/2009   Sporadic      Hyperlipidemia    Hypertension    Hypothyroidism 03/03/2007  Chronic  On Levothroid    Lactose intolerance 03/03/2017   Left ear hearing loss    wears right hearing aid   without hearing aides deaf   Memory loss 03/03/2007   2011 2016 Dr Jannifer Franklin - mild cognitive deficit 2016 Pt stopped Exelon on - recomended per Dr Jannifer Franklin' . Memory loss was attributed more to depression. 12/16 start Wellbutrin Pt needs to get hearing aids - much better after hearing was restored    Neoplasm of uncertain behavior of skin 09/07/2010   1/16 7/20 Skin lesion on back - skin bx   Onychomycosis of toenail 04/15/2014   Extensive 2016    Orthostatic hypotension 03/03/2017   1/19 Reduce Labetalol down to 1 tab twice a day Drink more water   PANCREATITIS, HX OF 10/13/2006   Qualifier: Diagnosis of  By: Doralee Albino     Personal history  of hyperthyroidism 2006   s/p 131I- Dr. Chalmers Cater, Hyperthyroid   Pseudogout 10/27/2010   2012 NSAIDs prn - very rare Colchicine prn   Pyelonephritis    Renal insufficiency 2011   Sebaceous cyst of eyelid, left 08/31/2018   7/20   TGA (transient global amnesia)    TIA (transient ischemic attack) 10/13/2006    2006, 2012, 03/16/14 Dr Jannifer Franklin On Aspirin; BP meds    Past Surgical History:  Procedure Laterality Date   CHOLECYSTECTOMY     COLONOSCOPY     IR FLUORO GUIDE CV LINE RIGHT  09/14/2017   IR REMOVAL TUN CV CATH W/O FL  09/20/2017   IR US GUIDE VASC ACCESS RIGHT  09/14/2017   right ear  surgery     BAHA bone anchored  with screw   STAPEDES SURGERY     LEFT EAR   TRANSURETHRAL RESECTION OF PROSTATE     TRANSURETHRAL RESECTION OF PROSTATE N/A 11/04/2017   Procedure: TRANSURETHRAL RESECTION OF THE PROSTATE (TURP);  Surgeon: Franchot Gallo, MD;  Location: [facility];  Service: Urology;  Laterality: N/A;  1 HR    Current Medications: Current Meds  Medication Sig   acetaminophen (TYLENOL) 500 MG tablet Take 500 mg by mouth 2 (two) times daily as needed for mild pain.   aspirin 81 MG chewable tablet Chew 81 mg by mouth daily. Take 2 per day   azithromycin (ZITHROMAX Z-PAK) 250 MG tablet As directed   buPROPion (WELLBUTRIN XL) 300 MG 24 hr tablet Take 1 tablet (300 mg total) by mouth daily.   cholecalciferol (VITAMIN D) 1000 UNITS tablet Take 1,000 Units by mouth daily.   cloNIDine (CATAPRES) 0.1 MG tablet TAKE 1 TABLET (0.1 MG TOTAL) BY MOUTH 3 (THREE) TIMES DAILY AS NEEDED (TAKE FOR SYSTOLIC BP GREATER THAN 903).   colchicine 0.6 MG tablet TAKE 1 TABLET (0.6 MG TOTAL) BY MOUTH 3 (THREE) TIMES DAILY AS NEEDED. FOR GOUT   Cyanocobalamin (VITAMIN B 12 PO) Take 1 tablet by mouth daily.   ezetimibe (ZETIA) 10 MG tablet TAKE 1 TABLET BY MOUTH EVERY DAY   fluticasone (FLONASE) 50 MCG/ACT nasal spray Place 2 sprays into both nostrils daily.   furosemide (LASIX) 20 MG tablet  Take 20 mg by mouth daily.   hydrALAZINE (APRESOLINE) 25 MG tablet Take 25 mg by mouth 2 (two) times daily.   labetalol (NORMODYNE) 100 MG tablet Take 100 mg by mouth 2 (two) times daily.   levothyroxine (SYNTHROID) 88 MCG tablet TAKE 1 TABLET (88 MCG TOTAL) BY MOUTH DAILY. KEEP SCHEDULED APPT NEED TSH CHECK FOR FUTURE REFILLS   polyethylene glycol (  MIRALAX / GLYCOLAX) packet Take 17 g by mouth daily as needed for mild constipation.   rosuvastatin (CRESTOR) 40 MG tablet TAKE 1 TABLET BY MOUTH EVERY DAY   sodium bicarbonate 325 MG tablet Take 325 mg by mouth 4 (four) times daily.     Allergies:   Lac bovis, Lipitor [atorvastatin], Other, Spironolactone, Zocor [simvastatin], Coreg [carvedilol], Donepezil hydrochloride, Hydrochlorothiazide, and Razadyne [galantamine hydrobromide]   Social History   Socioeconomic History   Marital status: Widowed    Spouse name: Not on file   Number of children: 3   Years of education: Not on file   Highest education level: Not on file  Occupational History   Not on file  Tobacco Use   Smoking status: Never Smoker   Smokeless tobacco: Never Used  Vaping Use   Vaping Use: Never used  Substance and Sexual Activity   Alcohol use: No   Drug use: No   Sexual activity: Not Currently  Other Topics Concern   Not on file  Social History Narrative   Patient is right handed.   Patient drinks 1-2 cups caffeine daily.   Social Determinants of Health   Financial Resource Strain: Low Risk    Difficulty of Paying Living Expenses: Not hard at all  Food Insecurity: Unknown   Worried About Charity fundraiser in the Last Year: Never true   Arboriculturist in the Last Year: Not on file  Transportation Needs: No Transportation Needs   Lack of Transportation (Medical): No   Lack of Transportation (Non-Medical): No  Physical Activity: Sufficiently Active   Days of Exercise per Week: 3 days   Minutes of Exercise per Session: 60 min   Stress: No Stress Concern Present   Feeling of Stress : Not at all  Social Connections: Unknown   Frequency of Communication with Friends and Family: More than three times a week   Frequency of Social Gatherings with Friends and Family: More than three times a week   Attends Religious Services: Patient refused   Active Member of Clubs or Organizations: Patient refused   Attends Archivist Meetings: Patient refused   Marital Status: Widowed     Family History: The patient's family history includes Dementia in his mother; Heart disease in his sister; Hypertension in an other family member. There is no history of Colon cancer, Stomach cancer, Esophageal cancer, or Rectal cancer.  ROS:   Please see the history of present illness.    ROS  All other systems reviewed and negative.   EKGs/Labs/Other Studies Reviewed:    The following studies were reviewed today: none  EKG:  EKG is  ordered today.  The ekg ordered today demonstrates NSR with nonspecific ST abnormality  Recent Labs: 04/18/2019: ALT 28 09/25/2019: BUN 41; Creat 4.08; Hemoglobin 11.8; Platelets 255; Potassium 4.4; Sodium 140; TSH 1.93   Recent Lipid Panel    Component Value Date/Time   CHOL 112 04/18/2019 1001   TRIG 111 04/18/2019 1001   HDL 47 04/18/2019 1001   CHOLHDL 2.4 04/18/2019 1001   CHOLHDL 6 08/29/2017 1605   VLDL 27.6 08/29/2017 1605   LDLCALC 45 04/18/2019 1001   LDLDIRECT 123.0 03/25/2014 1112    Physical Exam:    VS:  Ht 6\' 1"  (1.854 m)    Wt 193 lb 9.6 oz (87.8 kg)    SpO2 97%    BMI 25.54 kg/m     Orthostatic VS for the past 24 hrs (Last 3 readings):  BP- Lying Pulse- Lying BP- Sitting Pulse- Sitting BP- Standing at 0 minutes Pulse- Standing at 0 minutes BP- Standing at 3 minutes Pulse- Standing at 3 minutes  02/18/20 1333 190/89 69 162/83 62 126/73 76 129/80 80    Wt Readings from Last 3 Encounters:  02/18/20 193 lb 9.6 oz (87.8 kg)  11/14/19 191 lb (86.6 kg)  09/25/19 194  lb (88 kg)     GEN:  Well nourished, well developed in no acute distress HEENT: Normal NECK: No JVD; No carotid bruits LYMPHATICS: No lymphadenopathy CARDIAC: RRR, no murmurs, rubs, gallops RESPIRATORY:  Clear to auscultation without rales, wheezing or rhonchi  ABDOMEN: Soft, non-tender, non-distended MUSCULOSKELETAL:  No edema; No deformity  SKIN: Warm and dry NEUROLOGIC:  Alert and oriented x 3 PSYCHIATRIC:  Normal affect   ASSESSMENT:    1. Coronary artery disease involving native coronary artery of native heart without angina pectoris   2. Essential hypertension   3. SOB (shortness of breath)   4. Dyslipidemia   5. Orthostatic hypotension   6. Bilateral carotid artery stenosis    PLAN:    In order of problems listed above:  1.  ASCAD -nonobstructive CAD by cath in 2011 -he had not had any anginal chest pain but continues to have exertional fatigue which I suspect is related to his orthostatic hypotension -nuclear stress test 08/2018 showed no ischemia -continue ASA, BB and statin  2.  HTN -SBP elevated at 199mmHg supine but drops 41mmHg when he stands with dizziness -continue Labetalol 100mg  BID >>he has a normal HR response to drop in BP so ok to stay on for now -Followup with renal regarding Tx of his HTN in setting of severe ESRD>>may improve once on HD  3.  SOB -lexiscan myoview showed no ischemia and 2D echo with normal LVF -G1DD on echo -PYP scan was equivocal for amyloid and cardiac MRI cannot be done to CKD -SPEP and UPEP were normal  4.  HLD -LDL goal < 70 -LDL was 45 in Feb 2021 -continue Crestor 40mg  daily  5.  Orthostatic Hypotension -He remains orthostatic with a 70mmHg drop in his SBP when he stands and gets dizziness.   -I encouraged him to use compression hose during the day and abdominal binder  >> I stressed the importance of being compliant with this -cannot use Proamatine given that he is already having supine HTN and this will  exacerbate it -Would avoid Florinef in setting of HTN  -I referred him back to Dr. Jannifer Franklin at last OV due to  complaining of more muscle wasting in his upper torso  to make sure we are not dealing with a neurodegerative disorder and see if he has any other recommendations for treatment of orthostatics>>he never saw him back>>I will set up an appt  6.  Carotid artery stenosis -dopplers 11/2018 showed 1-39% bilateral stenosis -repeat dopplers 11/2020 -continue ASA and statin  Followup with me in 6 months   Medication Adjustments/Labs and Tests Ordered: Current medicines are reviewed at length with the patient today.  Concerns regarding medicines are outlined above.  No orders of the defined types were placed in this encounter.  No orders of the defined types were placed in this encounter.   Signed, Fransico Him, MD  02/18/2020 2:25 PM    [place]

## 2020-02-18 NOTE — Addendum Note (Signed)
Addended by: Antonieta Iba on: 02/18/2020 03:22 PM   Modules accepted: Orders

## 2020-02-20 ENCOUNTER — Other Ambulatory Visit: Payer: Self-pay

## 2020-02-20 ENCOUNTER — Encounter: Payer: Self-pay | Admitting: Internal Medicine

## 2020-02-20 ENCOUNTER — Ambulatory Visit (INDEPENDENT_AMBULATORY_CARE_PROVIDER_SITE_OTHER): Payer: PPO | Admitting: Internal Medicine

## 2020-02-20 DIAGNOSIS — R634 Abnormal weight loss: Secondary | ICD-10-CM

## 2020-02-20 DIAGNOSIS — R5381 Other malaise: Secondary | ICD-10-CM | POA: Diagnosis not present

## 2020-02-20 DIAGNOSIS — G459 Transient cerebral ischemic attack, unspecified: Secondary | ICD-10-CM

## 2020-02-20 DIAGNOSIS — E039 Hypothyroidism, unspecified: Secondary | ICD-10-CM

## 2020-02-20 DIAGNOSIS — F332 Major depressive disorder, recurrent severe without psychotic features: Secondary | ICD-10-CM | POA: Diagnosis not present

## 2020-02-20 DIAGNOSIS — N185 Chronic kidney disease, stage 5: Secondary | ICD-10-CM | POA: Diagnosis not present

## 2020-02-20 DIAGNOSIS — R413 Other amnesia: Secondary | ICD-10-CM

## 2020-02-20 MED ORDER — ESCITALOPRAM OXALATE 5 MG PO TABS: 5.0000 mg | ORAL_TABLET | Freq: Every day | ORAL | 5 refills | Status: DC

## 2020-02-20 NOTE — Assessment & Plan Note (Signed)
F/u w/neurology

## 2020-02-20 NOTE — Assessment & Plan Note (Signed)
Labs

## 2020-02-20 NOTE — Assessment & Plan Note (Signed)
Wt Readings from Last 3 Encounters:  02/20/20 191 lb (86.6 kg)  02/18/20 193 lb 9.6 oz (87.8 kg)  11/14/19 191 lb (86.6 kg)

## 2020-02-20 NOTE — Assessment & Plan Note (Signed)
Monitor labs 

## 2020-02-20 NOTE — Progress Notes (Signed)
Subjective:  Patient ID: Kenneth Williams, male    DOB: Mar 21, 1935  Age: 84 y.o. MRN: [id number]  CC: Follow-up   HPI Kenneth Williams presents for depression - not better, CAD, hypothyroidism, orthostatic hypotension... The patient comes in today with his daughter Kenneth Williams who provides history. Kenneth Williams lives with his son Kenneth Williams.  He has been a caregiver for Kenneth Williams for years: the house is very dirty and unkempt.  Kenneth Williams's hygiene is low, often times he AP is in the kitchen sink.  The house smells.  [name] watches TV in the living room all day and all night.  That makes Kenneth Williams staying his bedroom all day.  If repeat falls Kenneth Williams would not be able to get him up.   Outpatient Medications Prior to Visit  Medication Sig Dispense Refill  . acetaminophen (TYLENOL) 500 MG tablet Take 500 mg by mouth 2 (two) times daily as needed for mild pain.    Marland Kitchen aspirin 81 MG chewable tablet Chew 81 mg by mouth daily. Take 2 per day    . azithromycin (ZITHROMAX Z-PAK) 250 MG tablet As directed 6 tablet 0  . buPROPion (WELLBUTRIN XL) 300 MG 24 hr tablet Take 1 tablet (300 mg total) by mouth daily. 90 tablet 3  . cholecalciferol (VITAMIN D) 1000 UNITS tablet Take 1,000 Units by mouth daily.    . cloNIDine (CATAPRES) 0.1 MG tablet TAKE 1 TABLET (0.1 MG TOTAL) BY MOUTH 3 (THREE) TIMES DAILY AS NEEDED (TAKE FOR SYSTOLIC BP GREATER THAN 127). 270 tablet 1  . colchicine 0.6 MG tablet TAKE 1 TABLET (0.6 MG TOTAL) BY MOUTH 3 (THREE) TIMES DAILY AS NEEDED. FOR GOUT 60 tablet 2  . Cyanocobalamin (VITAMIN B 12 PO) Take 1 tablet by mouth daily.    Marland Kitchen ezetimibe (ZETIA) 10 MG tablet TAKE 1 TABLET BY MOUTH EVERY DAY 90 tablet 3  . fluticasone (FLONASE) 50 MCG/ACT nasal spray Place 2 sprays into both nostrils daily. 16 g 6  . furosemide (LASIX) 20 MG tablet Take 20 mg by mouth daily.    . hydrALAZINE (APRESOLINE) 25 MG tablet Take 25 mg by mouth 2 (two) times daily.    Marland Kitchen labetalol (NORMODYNE) 100 MG tablet Take 100 mg by mouth  2 (two) times daily.    Marland Kitchen levothyroxine (SYNTHROID) 88 MCG tablet TAKE 1 TABLET (88 MCG TOTAL) BY MOUTH DAILY. KEEP SCHEDULED APPT NEED TSH CHECK FOR FUTURE REFILLS 90 tablet 3  . polyethylene glycol (MIRALAX / GLYCOLAX) packet Take 17 g by mouth daily as needed for mild constipation. 14 each 0  . rosuvastatin (CRESTOR) 40 MG tablet TAKE 1 TABLET BY MOUTH EVERY DAY 90 tablet 2  . sodium bicarbonate 325 MG tablet Take 325 mg by mouth 4 (four) times daily.     No facility-administered medications prior to visit.    ROS: Review of Systems  Constitutional: Positive for fatigue. Negative for appetite change and unexpected weight change.  HENT: Positive for hearing loss. Negative for congestion, nosebleeds, sneezing, sore throat and trouble swallowing.   Eyes: Negative for itching and visual disturbance.  Respiratory: Negative for cough.   Cardiovascular: Negative for chest pain, palpitations and leg swelling.  Gastrointestinal: Negative for abdominal distention, blood in stool, diarrhea and nausea.  Genitourinary: Positive for frequency. Negative for hematuria.  Musculoskeletal: Positive for arthralgias, back pain and gait problem. Negative for joint swelling and neck pain.  Skin: Negative for rash.  Neurological: Positive for syncope, weakness and light-headedness. Negative for dizziness,  tremors and speech difficulty.  Psychiatric/Behavioral: Positive for behavioral problems and decreased concentration. Negative for agitation, dysphoric mood, sleep disturbance and suicidal ideas. The patient is nervous/anxious.     Objective:  BP (!) 142/78   Pulse 67   Temp 97.9 F (36.6 C) (Oral)   Ht 6\' 1"  (1.854 m)   Wt 191 lb (86.6 kg)   SpO2 96%   BMI 25.20 kg/m   BP Readings from Last 3 Encounters:  02/20/20 (!) 142/78  02/06/20 (!) 178/84  01/30/20 (!) 147/69    Wt Readings from Last 3 Encounters:  02/20/20 191 lb (86.6 kg)  02/18/20 193 lb 9.6 oz (87.8 kg)  11/14/19 191 lb (86.6 kg)     Physical Exam Constitutional:      General: He is not in acute distress.    Appearance: Normal appearance. He is well-developed.     Comments: NAD  HENT:     Mouth/Throat:     Mouth: Oropharynx is clear and moist.  Eyes:     Conjunctiva/sclera: Conjunctivae normal.     Pupils: Pupils are equal, round, and reactive to light.  Neck:     Thyroid: No thyromegaly.     Vascular: No JVD.  Cardiovascular:     Rate and Rhythm: Normal rate and regular rhythm.     Pulses: Intact distal pulses.     Heart sounds: Normal heart sounds. No murmur heard. No friction rub. No gallop.   Pulmonary:     Effort: Pulmonary effort is normal. No respiratory distress.     Breath sounds: Normal breath sounds. No wheezing or rales.  Chest:     Chest wall: No tenderness.  Abdominal:     General: Bowel sounds are normal. There is no distension.     Palpations: Abdomen is soft. There is no mass.     Tenderness: There is no abdominal tenderness. There is no guarding or rebound.  Musculoskeletal:        General: Tenderness present. No edema. Normal range of motion.     Cervical back: Normal range of motion.  Lymphadenopathy:     Cervical: No cervical adenopathy.  Skin:    General: Skin is warm and dry.     Findings: No rash.  Neurological:     Mental Status: He is alert and oriented to person, place, and time.     Cranial Nerves: No cranial nerve deficit.     Motor: No abnormal muscle tone.     Coordination: He displays a negative Romberg sign. Coordination abnormal.     Gait: Gait abnormal.     Deep Tendon Reflexes: Reflexes are normal and symmetric.  Psychiatric:        Mood and Affect: Mood and affect normal.        Behavior: Behavior normal.        Thought Content: Thought content normal.        Judgment: Judgment normal.   Hearing aid is on the left ear.  The patient is still hard hearing.  Slightly ataxic Is alert oriented and cooperative    A total time of >45 minutes was spent  preparing to see the patient, reviewing tests, x-rays, records.  Also, obtaining history and performing comprehensive physical exam.  Additionally, counseling the patient and his daughter regarding his depression, apathy and living conditions. Finally, documenting clinical information in the health records, coordination of care, educating the patient. It is a complex case.   Lab Results  Component Value Date  WBC 9.4 09/25/2019   HGB 11.8 (L) 09/25/2019   HCT 36.5 (L) 09/25/2019   PLT 255 09/25/2019   GLUCOSE 88 09/25/2019   CHOL 112 04/18/2019   TRIG 111 04/18/2019   HDL 47 04/18/2019   LDLDIRECT 123.0 03/25/2014   LDLCALC 45 04/18/2019   ALT 28 04/18/2019   AST 11 09/29/2018   NA 140 09/25/2019   K 4.4 09/25/2019   CL 106 09/25/2019   CREATININE 4.08 (H) 09/25/2019   BUN 41 (H) 09/25/2019   CO2 22 09/25/2019   TSH 1.93 09/25/2019   PSA 1.73 05/18/2016   INR 1.03 09/13/2017   HGBA1C 5.0 09/25/2019    No results found.  Assessment & Plan:    Walker Kehr, MD

## 2020-02-20 NOTE — Assessment & Plan Note (Signed)
On ASA No relapse 

## 2020-02-20 NOTE — Assessment & Plan Note (Signed)
Kenneth Williams comes in today with his daughter Kenneth Williams. Kenneth Williams lives with his son Kenneth Williams.  He has been a caregiver for Kenneth Williams for years: Kenneth house is very dirty and unkempt.  Kenneth Williams's hygiene is low, often times he AP is in Kenneth kitchen sink.  Kenneth house smells.  Kenneth Williams watches TV in Kenneth living room all day and all night.  That makes [name] staying his bedroom all day.  If Kenneth Williams falls Kenneth Williams would not be able to get him up.  We discussed Kenneth situation with Kenneth Williams and his daughter.  Would be best to separate them apart.  Kenneth Williams used to live by himself in a group home setting and did well.  Kenneth Williams is going to contact social services for help.

## 2020-02-20 NOTE — Assessment & Plan Note (Addendum)
The patient comes in today with his daughter Kenneth Williams. Kenneth Williams lives with his son Kenneth Williams.  He has been a caregiver for Kenneth Williams for years: the house is very dirty and unkempt.  Kenneth Williams's hygiene is low, often times he AP is in the kitchen sink.  The house smells.  [name] watches TV in the living room all day and all night.  That makes Kenneth Williams staying his bedroom all day.  If Kenneth Williams falls Kenneth Williams would not be able to get him up.  We discussed the situation with the patient and his daughter.  Would be best to separate them apart.  Kenneth Williams used to live by himself in a group home setting and did well.  Kenneth Williams is going to contact social services for help. On Wellbutrin in AM-we can continue Add Lexapro 5 mg/d at hs

## 2020-03-06 DIAGNOSIS — H9071 Mixed conductive and sensorineural hearing loss, unilateral, right ear, with unrestricted hearing on the contralateral side: Secondary | ICD-10-CM | POA: Diagnosis not present

## 2020-03-13 DIAGNOSIS — M5412 Radiculopathy, cervical region: Secondary | ICD-10-CM | POA: Diagnosis not present

## 2020-03-17 ENCOUNTER — Other Ambulatory Visit: Payer: Self-pay | Admitting: Internal Medicine

## 2020-03-24 ENCOUNTER — Other Ambulatory Visit: Payer: Self-pay | Admitting: *Deleted

## 2020-03-24 DIAGNOSIS — N185 Chronic kidney disease, stage 5: Secondary | ICD-10-CM

## 2020-04-04 DIAGNOSIS — H26493 Other secondary cataract, bilateral: Secondary | ICD-10-CM | POA: Diagnosis not present

## 2020-04-04 DIAGNOSIS — Z961 Presence of intraocular lens: Secondary | ICD-10-CM | POA: Diagnosis not present

## 2020-04-09 ENCOUNTER — Telehealth: Payer: Self-pay | Admitting: Internal Medicine

## 2020-04-09 NOTE — Telephone Encounter (Signed)
Patient is requesting a call from the nurse. All he would say was that it was about medication.   Please follow up with patient.  Thank you.

## 2020-04-10 NOTE — Telephone Encounter (Signed)
Called [name] back on yesterday. He had questions concerning son medication. Took msg up under son acct " Kenneth Williams".Marland KitchenJohny Williams

## 2020-04-15 ENCOUNTER — Encounter: Payer: Self-pay | Admitting: Vascular Surgery

## 2020-04-15 ENCOUNTER — Ambulatory Visit (INDEPENDENT_AMBULATORY_CARE_PROVIDER_SITE_OTHER)
Admission: RE | Admit: 2020-04-15 | Discharge: 2020-04-15 | Disposition: A | Discharge status: Home / Self Care | Payer: PPO | Source: Ambulatory Visit | Attending: Vascular Surgery | Admitting: Vascular Surgery

## 2020-04-15 ENCOUNTER — Ambulatory Visit: Payer: PPO | Admitting: Vascular Surgery

## 2020-04-15 ENCOUNTER — Other Ambulatory Visit: Payer: Self-pay

## 2020-04-15 ENCOUNTER — Ambulatory Visit (HOSPITAL_COMMUNITY)
Admission: RE | Admit: 2020-04-15 | Discharge: 2020-04-15 | Disposition: A | Discharge status: Home / Self Care | Payer: PPO | Source: Ambulatory Visit | Attending: Vascular Surgery | Admitting: Vascular Surgery

## 2020-04-15 VITALS — BP 183/98 | HR 73 | Temp 97.6°F | Resp 20 | Ht 73.0 in | Wt 187.8 lb

## 2020-04-15 DIAGNOSIS — N185 Chronic kidney disease, stage 5: Secondary | ICD-10-CM

## 2020-04-15 DIAGNOSIS — N184 Chronic kidney disease, stage 4 (severe): Secondary | ICD-10-CM | POA: Diagnosis not present

## 2020-04-15 NOTE — Progress Notes (Signed)
ASSESSMENT & PLAN:  Kenneth Williams is a 85 y.o. right handed male in need of permanent hemodialysis access. I reviewed options for dialysis in detail with the patient. I counseled the patient that dialysis access requires surveillance and periodic maintenance. Plan to proceed with LUE AVG when patient is about a month away from initiating HD.   CHIEF COMPLAINT:   HD access  HISTORY:  HISTORY OF PRESENT ILLNESS: Kenneth Williams is a 85 y.o. male with stage IV chronic kidney disease who presents to the office for discussion of permanent dialysis access.  We had a long conversation about the different modalities used for dialysis, the expected course of dialysis therapy, different options for hemodialysis access, and expected need for surveillance and maintenance of dialysis access.  He unfortunately has no suitable vein for autogenous access.  Past Medical History:  Diagnosis Date  . Actinic keratosis 10/22/2009   Face  4/17 neck, hand 11/20 nose  . Acute upper respiratory infection 05/05/2010   2/17     . Blepharitis of both eyes 03/02/2018   2020 eryth oint  . BPH (benign prostatic hyperplasia)   . CAD (coronary artery disease)    cath 2011-mild non obstructive, coronary artery calcium score of 727 on CT 04/13/2018  . Carotid artery stenosis 02/29/2012   2013 B 0-39% q 24 mo   . Coronary artery disease 04/03/2018   2/20 coronary calcium score is 727. It is consistent with a extensive degree of coronary atherosclerosis.  Pravastatin, ASA Cardiol - Dr [name]  . CRF (chronic renal failure), stage 5 ([place]) 08/14/2009   Chronic stage 4-5 Dr Johnney Ou The patient underwent a TURP on 11/04/2017 S/p nephrology consult 2019 SBP 130-140 at home  . Diverticulitis of colon 10/13/2006   Dr Fuller Plan Colon 2014   . DOE (dyspnea on exertion) 03/03/2017   1/19  . Dyslipidemia 07/21/2016   5/18 D/c Simvastatin due to potential drug interactions 2018 Start Lipitor 11/18Stopped Lipitor - diarrhea stopped and then came  back again... Off Lipitor now 2019 Livalo - too $$$: try Pravachol 12/19 CT ca scoring option discussed  . Dyspnea    with activity  . E coli bacteremia   . Elevated PSA    history of   . Enlarged prostate with urinary retention 11/04/2017  . Gout    2009  . Gout 07/21/2016   Recurrent   . History of diverticulitis of colon   . History of pancreatitis   . History of TIAs   . HYPERKALEMIA 10/22/2009   Sporadic     . Hyperlipidemia   . Hypertension   . Hypothyroidism 03/03/2007   Chronic  On Levothroid   . Lactose intolerance 03/03/2017  . Left ear hearing loss    wears right hearing aid   without hearing aides deaf  . Memory loss 03/03/2007   2011 2016 Dr Jannifer Franklin - mild cognitive deficit 2016 Pt stopped Exelon on - recomended per Dr Jannifer Franklin' . Memory loss was attributed more to depression. 12/16 start Wellbutrin Pt needs to get hearing aids - much better after hearing was restored   . Neoplasm of uncertain behavior of skin 09/07/2010   1/16 7/20 Skin lesion on back - skin bx  . Onychomycosis of toenail 04/15/2014   Extensive 2016   . Orthostatic hypotension 03/03/2017   1/19 Reduce Labetalol down to 1 tab twice a day Drink more water  . PANCREATITIS, HX OF 10/13/2006   Qualifier: Diagnosis of  By: Doralee Albino    .  Personal history of hyperthyroidism 2006   s/p 131I- Dr. Chalmers Cater, Hyperthyroid  . Pseudogout 10/27/2010   2012 NSAIDs prn - very rare Colchicine prn  . Pyelonephritis   . Renal insufficiency 2011  . Sebaceous cyst of eyelid, left 08/31/2018   7/20  . TGA (transient global amnesia)   . TIA (transient ischemic attack) 10/13/2006    2006, 2012, 03/16/14 Dr Jannifer Franklin On Aspirin; BP meds    Past Surgical History:  Procedure Laterality Date  . CHOLECYSTECTOMY    . COLONOSCOPY    . IR FLUORO GUIDE CV LINE RIGHT  09/14/2017  . IR REMOVAL TUN CV CATH W/O FL  09/20/2017  . IR US GUIDE VASC ACCESS RIGHT  09/14/2017  . right ear  surgery     BAHA bone anchored  with screw  . STAPEDES  SURGERY     LEFT EAR  . TRANSURETHRAL RESECTION OF PROSTATE    . TRANSURETHRAL RESECTION OF PROSTATE N/A 11/04/2017   Procedure: TRANSURETHRAL RESECTION OF THE PROSTATE (TURP);  Surgeon: Franchot Gallo, MD;  Location: [facility];  Service: Urology;  Laterality: N/A;  1 HR    Family History  Problem Relation Age of Onset  . Heart disease Sister   . Dementia Mother   . Hypertension Other   . Colon cancer Neg Hx   . Stomach cancer Neg Hx   . Esophageal cancer Neg Hx   . Rectal cancer Neg Hx     Social History   Socioeconomic History  . Marital status: Widowed    Spouse name: Not on file  . Number of children: 3  . Years of education: Not on file  . Highest education level: Not on file  Occupational History  . Not on file  Tobacco Use  . Smoking status: Never Smoker  . Smokeless tobacco: Never Used  Vaping Use  . Vaping Use: Never used  Substance and Sexual Activity  . Alcohol use: No  . Drug use: No  . Sexual activity: Not Currently  Other Topics Concern  . Not on file  Social History Narrative   Patient is right handed.   Patient drinks 1-2 cups caffeine daily.   Social Determinants of Health   Financial Resource Strain: Low Risk   . Difficulty of Paying Living Expenses: Not hard at all  Food Insecurity: Unknown  . Worried About Charity fundraiser in the Last Year: Never true  . Ran Out of Food in the Last Year: Not on file  Transportation Needs: No Transportation Needs  . Lack of Transportation (Medical): No  . Lack of Transportation (Non-Medical): No  Physical Activity: Sufficiently Active  . Days of Exercise per Week: 3 days  . Minutes of Exercise per Session: 60 min  Stress: No Stress Concern Present  . Feeling of Stress : Not at all  Social Connections: Unknown  . Frequency of Communication with Friends and Family: More than three times a week  . Frequency of Social Gatherings with Friends and Family: More than three times a week  . Attends Religious  Services: Patient refused  . Active Member of Clubs or Organizations: Patient refused  . Attends Archivist Meetings: Patient refused  . Marital Status: Widowed  Intimate Partner Violence: Not At Risk  . Fear of Current or Ex-Partner: No  . Emotionally Abused: No  . Physically Abused: No  . Sexually Abused: No    Allergies  Allergen Reactions  . Lac Bovis     Other reaction(s):  Other (See Comments) ...  . Lipitor [Atorvastatin]     diarrhea  . Other     Dairy products-diarrhea   . Spironolactone     gynecomastia  . Zocor [Simvastatin]     achy  . Coreg [Carvedilol] Diarrhea    Diarrhea and bradycardia  . Donepezil Hydrochloride Diarrhea    REACTION: diarrhea  . Hydrochlorothiazide Other (See Comments)    REACTION: Gout  . Razadyne [Galantamine Hydrobromide] Diarrhea    diarrhea    Current Outpatient Medications  Medication Sig Dispense Refill  . acetaminophen (TYLENOL) 500 MG tablet Take 500 mg by mouth 2 (two) times daily as needed for mild pain.    Marland Kitchen aspirin 81 MG chewable tablet Chew 81 mg by mouth daily. Take 2 per day    . buPROPion (WELLBUTRIN XL) 300 MG 24 hr tablet Take 1 tablet (300 mg total) by mouth daily. 90 tablet 3  . cholecalciferol (VITAMIN D) 1000 UNITS tablet Take 1,000 Units by mouth daily.    . cloNIDine (CATAPRES) 0.1 MG tablet TAKE 1 TABLET (0.1 MG TOTAL) BY MOUTH 3 (THREE) TIMES DAILY AS NEEDED (TAKE FOR SYSTOLIC BP GREATER THAN 193). 270 tablet 1  . colchicine 0.6 MG tablet TAKE 1 TABLET (0.6 MG TOTAL) BY MOUTH 3 (THREE) TIMES DAILY AS NEEDED. FOR GOUT 60 tablet 2  . Cyanocobalamin (VITAMIN B 12 PO) Take 1 tablet by mouth daily.    Marland Kitchen escitalopram (LEXAPRO) 5 MG tablet TAKE 1 TABLET BY MOUTH EVERYDAY AT BEDTIME 90 tablet 3  . ezetimibe (ZETIA) 10 MG tablet TAKE 1 TABLET BY MOUTH EVERY DAY 90 tablet 3  . fluticasone (FLONASE) 50 MCG/ACT nasal spray Place 2 sprays into both nostrils daily. 16 g 6  . furosemide (LASIX) 20 MG tablet Take 20  mg by mouth daily.    . hydrALAZINE (APRESOLINE) 25 MG tablet Take 25 mg by mouth 2 (two) times daily.    Marland Kitchen labetalol (NORMODYNE) 100 MG tablet Take 100 mg by mouth 2 (two) times daily.    Marland Kitchen levothyroxine (SYNTHROID) 88 MCG tablet TAKE 1 TABLET (88 MCG TOTAL) BY MOUTH DAILY. KEEP SCHEDULED APPT NEED TSH CHECK FOR FUTURE REFILLS 90 tablet 3  . polyethylene glycol (MIRALAX / GLYCOLAX) packet Take 17 g by mouth daily as needed for mild constipation. 14 each 0  . rosuvastatin (CRESTOR) 40 MG tablet TAKE 1 TABLET BY MOUTH EVERY DAY 90 tablet 2  . sodium bicarbonate 325 MG tablet Take 325 mg by mouth 4 (four) times daily.     No current facility-administered medications for this visit.    REVIEW OF SYSTEMS:  [X]  denotes positive finding, [ ]  denotes negative finding Cardiac  Comments:  Chest pain or chest pressure:    Shortness of breath upon exertion:    Short of breath when lying flat:    Irregular heart rhythm:        Vascular    Pain in calf, thigh, or hip brought on by ambulation:    Pain in feet at night that wakes you up from your sleep:     Blood clot in your veins:    Leg swelling:         Pulmonary    Oxygen at home:    Productive cough:     Wheezing:         Neurologic    Sudden weakness in arms or legs:     Sudden numbness in arms or legs:     Sudden onset  of difficulty speaking or slurred speech:    Temporary loss of vision in one eye:     Problems with dizziness:         Gastrointestinal    Blood in stool:     Vomited blood:         Genitourinary    Burning when urinating:     Blood in urine:        Psychiatric    Major depression:         Hematologic    Bleeding problems:    Problems with blood clotting too easily:        Skin    Rashes or ulcers:        Constitutional    Fever or chills:     PHYSICAL EXAM:   Vitals:   04/15/20 1451  BP: (!) 183/98  Pulse: 73  Resp: 20  Temp: 97.6 F (36.4 C)  SpO2: 96%  Weight: 187 lb 12.8 oz (85.2 kg)   Height: 6\' 1"  (1.854 m)   Constitutional: elderly, frail appearing in no distress. Appears marginally nourished.  Neurologic: CN intact. No focal findings. No sensory loss. Psychiatric: Mood and affect symmetric and appropriate. Eyes: No icterus. No conjunctival pallor. Ears, nose, throat: mucous membranes moist. Midline trachea.  Cardiac: regular rate and rhythm.  Respiratory: unlabored. Abdominal: soft, non-tender, non-distended.  Peripheral vascular:  2+ radial pulses bilaterally Extremity: No edema. No cyanosis. No pallor.  Skin: No gangrene. No ulceration.  Lymphatic: No Stemmer's sign. No palpable lymphadenopathy.   DATA REVIEW:    Most recent CBC CBC Latest Ref Rng & Units 09/25/2019 01/25/2018 10/31/2017  WBC 3.8 - 10.8 Thousand/uL 9.4 9.7 -  Hemoglobin 13.2 - 17.1 g/dL 11.8(L) 12.6(L) 10.9(L)  Hematocrit 38.5 - 50.0 % 36.5(L) 37.9(L) 32.0(L)  Platelets 140 - 400 Thousand/uL 255 264.0 -     Most recent CMP CMP Latest Ref Rng & Units 09/25/2019 04/18/2019 12/05/2018  Glucose 65 - 99 mg/dL 88 - -  BUN 7 - 25 mg/dL 41(H) - -  Creatinine 0.70 - 1.11 mg/dL 4.08(H) - -  Sodium 135 - 146 mmol/L 140 - -  Potassium 3.5 - 5.3 mmol/L 4.4 - -  Chloride 98 - 110 mmol/L 106 - -  CO2 20 - 32 mmol/L 22 - -  Calcium 8.6 - 10.3 mg/dL 8.9 - -  Total Protein 6.0 - 8.5 g/dL - - 5.8(L)  Total Bilirubin 0.0 - 1.2 mg/dL - - -  Alkaline Phos 39 - 117 IU/L - - -  AST 0 - 40 IU/L - - -  ALT 0 - 44 IU/L - 28 -    Renal function CrCl cannot be calculated (Patient's most recent lab result is older than the maximum 21 days allowed.).  Hgb A1c MFr Bld (% of total Hgb)  Date Value  09/25/2019 5.0    LDL Chol Calc (NIH)  Date Value Ref Range Status  04/18/2019 45 0 - 99 mg/dL Final   Direct LDL  Date Value Ref Range Status  03/25/2014 123.0 mg/dL Final    Comment:    Optimal:  <100 mg/dLNear or Above Optimal:  100-129 mg/dLBorderline High:  130-159 mg/dLHigh:  160-189 mg/dLVery High:   >190 mg/dL     Vascular Imaging: Vein mapping 04/15/20    Yevonne Aline. Stanford Breed, MD [facility] Phone Number: [phone] 04/15/2020 5:00 PM

## 2020-04-23 ENCOUNTER — Ambulatory Visit: Payer: PPO | Admitting: Internal Medicine

## 2020-04-23 DIAGNOSIS — Z0289 Encounter for other administrative examinations: Secondary | ICD-10-CM

## 2020-04-30 DIAGNOSIS — H02834 Dermatochalasis of left upper eyelid: Secondary | ICD-10-CM | POA: Diagnosis not present

## 2020-04-30 DIAGNOSIS — Z9889 Other specified postprocedural states: Secondary | ICD-10-CM | POA: Diagnosis not present

## 2020-04-30 DIAGNOSIS — H02831 Dermatochalasis of right upper eyelid: Secondary | ICD-10-CM | POA: Diagnosis not present

## 2020-04-30 DIAGNOSIS — Z961 Presence of intraocular lens: Secondary | ICD-10-CM | POA: Diagnosis not present

## 2020-04-30 DIAGNOSIS — H26492 Other secondary cataract, left eye: Secondary | ICD-10-CM | POA: Diagnosis not present

## 2020-05-02 DIAGNOSIS — E213 Hyperparathyroidism, unspecified: Secondary | ICD-10-CM | POA: Diagnosis not present

## 2020-05-02 DIAGNOSIS — N189 Chronic kidney disease, unspecified: Secondary | ICD-10-CM | POA: Diagnosis not present

## 2020-05-02 DIAGNOSIS — E039 Hypothyroidism, unspecified: Secondary | ICD-10-CM | POA: Diagnosis not present

## 2020-05-02 DIAGNOSIS — N184 Chronic kidney disease, stage 4 (severe): Secondary | ICD-10-CM | POA: Diagnosis not present

## 2020-05-02 DIAGNOSIS — I120 Hypertensive chronic kidney disease with stage 5 chronic kidney disease or end stage renal disease: Secondary | ICD-10-CM | POA: Diagnosis not present

## 2020-05-02 DIAGNOSIS — M109 Gout, unspecified: Secondary | ICD-10-CM | POA: Diagnosis not present

## 2020-05-02 DIAGNOSIS — E872 Acidosis: Secondary | ICD-10-CM | POA: Diagnosis not present

## 2020-05-02 DIAGNOSIS — D631 Anemia in chronic kidney disease: Secondary | ICD-10-CM | POA: Diagnosis not present

## 2020-05-02 DIAGNOSIS — N185 Chronic kidney disease, stage 5: Secondary | ICD-10-CM | POA: Diagnosis not present

## 2020-05-09 DIAGNOSIS — F339 Major depressive disorder, recurrent, unspecified: Secondary | ICD-10-CM | POA: Diagnosis not present

## 2020-05-09 DIAGNOSIS — I739 Peripheral vascular disease, unspecified: Secondary | ICD-10-CM | POA: Diagnosis not present

## 2020-05-09 DIAGNOSIS — N185 Chronic kidney disease, stage 5: Secondary | ICD-10-CM | POA: Diagnosis not present

## 2020-05-09 DIAGNOSIS — E261 Secondary hyperaldosteronism: Secondary | ICD-10-CM | POA: Diagnosis not present

## 2020-05-09 DIAGNOSIS — I5030 Unspecified diastolic (congestive) heart failure: Secondary | ICD-10-CM | POA: Diagnosis not present

## 2020-05-09 DIAGNOSIS — I2510 Atherosclerotic heart disease of native coronary artery without angina pectoris: Secondary | ICD-10-CM | POA: Diagnosis not present

## 2020-05-09 DIAGNOSIS — N2581 Secondary hyperparathyroidism of renal origin: Secondary | ICD-10-CM | POA: Diagnosis not present

## 2020-05-09 DIAGNOSIS — Z7982 Long term (current) use of aspirin: Secondary | ICD-10-CM | POA: Diagnosis not present

## 2020-05-21 ENCOUNTER — Encounter: Payer: Self-pay | Admitting: Neurology

## 2020-05-21 ENCOUNTER — Ambulatory Visit: Payer: PPO | Admitting: Neurology

## 2020-05-21 VITALS — Ht 73.5 in | Wt 192.0 lb

## 2020-05-21 DIAGNOSIS — H9193 Unspecified hearing loss, bilateral: Secondary | ICD-10-CM

## 2020-05-21 DIAGNOSIS — R5381 Other malaise: Secondary | ICD-10-CM | POA: Diagnosis not present

## 2020-05-21 DIAGNOSIS — I951 Orthostatic hypotension: Secondary | ICD-10-CM

## 2020-05-21 MED ORDER — PYRIDOSTIGMINE BROMIDE 60 MG PO TABS: 30.0000 mg | ORAL_TABLET | Freq: Three times a day (TID) | ORAL | 2 refills | Status: DC

## 2020-05-21 NOTE — Progress Notes (Signed)
Reason for visit: Orthostatic hypotension  Referring physician: Dr. Heloise Purpura is a 85 y.o. male  History of present illness:  Kenneth Williams is an 85 year old right-handed white male with a history of hypertension but he also suffers from significant orthostatic hypotension.  The patient has had issues for a number of years.  He has chronic renal insufficiency, stage IV, and he may need to go on hemodialysis in the near future.  The patient reports problems with chronic neck discomfort, he has trouble keeping his neck up, he has been seen by Dr. Nelva Bush and received injections, MRI of the cervical spine has been done in the past.  The patient reports no issues controlling the bowels or the bladder.  He denies shortness of breath or chest pain.  He is suffering from significant fatigue, he can only stand up and ambulate for 2 or 3 minutes.  He has a lot of problems getting through a shower.  He has to rest afterwards.  He spends most of his day sitting in a recliner.  He does very little physical activity.  The patient reports no numbness or focal weakness of the extremities.  He does feel weak all over.  The patient has been seen by Dr. [name], there are concerns about using Florinef or midodrine or Northera due to significant hypertension when supine.  The patient has compression stockings but he finds them difficult to put on, he has not yet been using an abdominal binder but he is thinking about getting one.  He sleeps flat in bed, he does not elevate the bed.  Past Medical History:  Diagnosis Date  . Actinic keratosis 10/22/2009   Face  4/17 neck, hand 11/20 nose  . Acute upper respiratory infection 05/05/2010   2/17     . Blepharitis of both eyes 03/02/2018   2020 eryth oint  . BPH (benign prostatic hyperplasia)   . CAD (coronary artery disease)    cath 2011-mild non obstructive, coronary artery calcium score of 727 on CT 04/13/2018  . Carotid artery stenosis 02/29/2012   2013 B  0-39% q 24 mo   . Coronary artery disease 04/03/2018   2/20 coronary calcium score is 727. It is consistent with a extensive degree of coronary atherosclerosis.  Pravastatin, ASA Cardiol - Dr [name]  . CRF (chronic renal failure), stage 5 ([place]) 08/14/2009   Chronic stage 4-5 Dr Johnney Ou The patient underwent a TURP on 11/04/2017 S/p nephrology consult 2019 SBP 130-140 at home  . Diverticulitis of colon 10/13/2006   Dr Fuller Plan Colon 2014   . DOE (dyspnea on exertion) 03/03/2017   1/19  . Dyslipidemia 07/21/2016   5/18 D/c Simvastatin due to potential drug interactions 2018 Start Lipitor 11/18Stopped Lipitor - diarrhea stopped and then came back again... Off Lipitor now 2019 Livalo - too $$$: try Pravachol 12/19 CT ca scoring option discussed  . Dyspnea    with activity  . E coli bacteremia   . Elevated PSA    history of   . Enlarged prostate with urinary retention 11/04/2017  . Gout    2009  . Gout 07/21/2016   Recurrent   . History of diverticulitis of colon   . History of pancreatitis   . History of TIAs   . HYPERKALEMIA 10/22/2009   Sporadic     . Hyperlipidemia   . Hypertension   . Hypothyroidism 03/03/2007   Chronic  On Levothroid   . Lactose intolerance 03/03/2017  .  Left ear hearing loss    wears right hearing aid   without hearing aides deaf  . Memory loss 03/03/2007   2011 2016 Dr Jannifer Franklin - mild cognitive deficit 2016 Pt stopped Exelon on - recomended per Dr Jannifer Franklin' . Memory loss was attributed more to depression. 12/16 start Wellbutrin Pt needs to get hearing aids - much better after hearing was restored   . Neoplasm of uncertain behavior of skin 09/07/2010   1/16 7/20 Skin lesion on back - skin bx  . Onychomycosis of toenail 04/15/2014   Extensive 2016   . Orthostatic hypotension 03/03/2017   1/19 Reduce Labetalol down to 1 tab twice a day Drink more water  . PANCREATITIS, HX OF 10/13/2006   Qualifier: Diagnosis of  By: Doralee Albino    . Personal history of hyperthyroidism 2006    s/p 131I- Dr. Chalmers Cater, Hyperthyroid  . Pseudogout 10/27/2010   2012 NSAIDs prn - very rare Colchicine prn  . Pyelonephritis   . Renal insufficiency 2011  . Sebaceous cyst of eyelid, left 08/31/2018   7/20  . TGA (transient global amnesia)   . TIA (transient ischemic attack) 10/13/2006    2006, 2012, 03/16/14 Dr Jannifer Franklin On Aspirin; BP meds    Past Surgical History:  Procedure Laterality Date  . CHOLECYSTECTOMY    . COLONOSCOPY    . IR FLUORO GUIDE CV LINE RIGHT  09/14/2017  . IR REMOVAL TUN CV CATH W/O FL  09/20/2017  . IR US GUIDE VASC ACCESS RIGHT  09/14/2017  . right ear  surgery     BAHA bone anchored  with screw  . STAPEDES SURGERY     LEFT EAR  . TRANSURETHRAL RESECTION OF PROSTATE    . TRANSURETHRAL RESECTION OF PROSTATE N/A 11/04/2017   Procedure: TRANSURETHRAL RESECTION OF THE PROSTATE (TURP);  Surgeon: Franchot Gallo, MD;  Location: [facility];  Service: Urology;  Laterality: N/A;  1 HR    Family History  Problem Relation Age of Onset  . Heart disease Sister   . Dementia Mother   . Hypertension Other   . Colon cancer Neg Hx   . Stomach cancer Neg Hx   . Esophageal cancer Neg Hx   . Rectal cancer Neg Hx     Social history:  reports that he has never smoked. He has never used smokeless tobacco. He reports that he does not drink alcohol and does not use drugs.  Medications:  Prior to Admission medications   Medication Sig Start Date End Date Taking? Authorizing Provider  acetaminophen (TYLENOL) 500 MG tablet Take 500 mg by mouth 2 (two) times daily as needed for mild pain.    [provider]  aspirin 81 MG chewable tablet Chew 81 mg by mouth daily. Take 2 per day    [provider]  buPROPion (WELLBUTRIN XL) 300 MG 24 hr tablet Take 1 tablet (300 mg total) by mouth daily. 09/25/19   Plotnikov, Evie Lacks, MD  cholecalciferol (VITAMIN D) 1000 UNITS tablet Take 1,000 Units by mouth daily.    [provider]  cloNIDine (CATAPRES) 0.1 MG tablet TAKE 1  TABLET (0.1 MG TOTAL) BY MOUTH 3 (THREE) TIMES DAILY AS NEEDED (TAKE FOR SYSTOLIC BP GREATER THAN 983). 06/18/19   Plotnikov, Evie Lacks, MD  colchicine 0.6 MG tablet TAKE 1 TABLET (0.6 MG TOTAL) BY MOUTH 3 (THREE) TIMES DAILY AS NEEDED. FOR GOUT 10/31/17   Plotnikov, Evie Lacks, MD  Cyanocobalamin (VITAMIN B 12 PO) Take 1 tablet by mouth  daily.    [provider]  escitalopram (LEXAPRO) 5 MG tablet TAKE 1 TABLET BY MOUTH EVERYDAY AT BEDTIME 03/17/20   Plotnikov, Evie Lacks, MD  ezetimibe (ZETIA) 10 MG tablet TAKE 1 TABLET BY MOUTH EVERY DAY 01/14/20   Turner, Eber Hong, MD  fluticasone (FLONASE) 50 MCG/ACT nasal spray Place 2 sprays into both nostrils daily. 12/12/17   Plotnikov, Evie Lacks, MD  furosemide (LASIX) 20 MG tablet Take 20 mg by mouth daily. 04/14/19   [provider]  hydrALAZINE (APRESOLINE) 25 MG tablet Take 25 mg by mouth 2 (two) times daily. 03/29/19   [provider]  labetalol (NORMODYNE) 100 MG tablet Take 100 mg by mouth 2 (two) times daily. 05/01/19   [provider]  levothyroxine (SYNTHROID) 88 MCG tablet TAKE 1 TABLET (88 MCG TOTAL) BY MOUTH DAILY. KEEP SCHEDULED APPT NEED TSH CHECK FOR FUTURE REFILLS 12/16/19   Plotnikov, Evie Lacks, MD  polyethylene glycol (MIRALAX / GLYCOLAX) packet Take 17 g by mouth daily as needed for mild constipation. 09/19/17   Jani Gravel, MD  rosuvastatin (CRESTOR) 40 MG tablet TAKE 1 TABLET BY MOUTH EVERY DAY 11/27/19   Sueanne Margarita, MD  sodium bicarbonate 325 MG tablet Take 325 mg by mouth 4 (four) times daily.    [provider]      Allergies  Allergen Reactions  . Lac Bovis     Other reaction(s): Other (See Comments) ...  . Lipitor [Atorvastatin]     diarrhea  . Other     Dairy products-diarrhea   . Spironolactone     gynecomastia  . Zocor [Simvastatin]     achy  . Coreg [Carvedilol] Diarrhea    Diarrhea and bradycardia  . Donepezil Hydrochloride Diarrhea    REACTION: diarrhea  .  Hydrochlorothiazide Other (See Comments)    REACTION: Gout  . Razadyne [Galantamine Hydrobromide] Diarrhea    diarrhea    ROS:  Out of a complete 14 system review of symptoms, the patient complains only of the following symptoms, and all other reviewed systems are negative.  Fatigue Dizziness Memory problems Hard of hearing  Height 6' 1.5" (1.867 m), weight 192 lb (87.1 kg).   Blood pressure supine is 162/84 with a heart rate of 69.  Blood pressure sitting is 123/66 with a heart rate of 71.  Blood pressure standing is 88/56 with a heart rate of 72.  After standing for 3 minutes, the blood pressure is 94/58 with a heart rate of 77.  Physical Exam  General: The patient is alert and cooperative at the time of the examination.  The patient is hard of hearing.  Eyes: Pupils are equal, round, and reactive to light. Discs are flat bilaterally.  Neck: The neck is supple, no carotid bruits are noted.  Respiratory: The respiratory examination is clear.  Cardiovascular: The cardiovascular examination reveals a regular rate and rhythm, no obvious murmurs or rubs are noted.  Skin: Extremities are without significant edema.  Neurologic Exam  Mental status: The patient is alert and oriented x 3 at the time of the examination. The patient has apparent normal recent and remote memory, with an apparently normal attention span and concentration ability.  Cranial nerves: Facial symmetry is present. There is good sensation of the face to pinprick and soft touch bilaterally. The strength of the facial muscles and the muscles to head turning and shoulder shrug are normal bilaterally. Speech is well enunciated, no aphasia or dysarthria is noted. Extraocular movements are full.  Visual fields are full. The tongue is midline, and the patient has symmetric elevation of the soft palate. No obvious hearing deficits are noted.  Motor: The motor testing reveals 5 over 5 strength of all 4 extremities. Good  symmetric motor tone is noted throughout.  Sensory: Sensory testing is intact to pinprick, soft touch, vibration sensation, and position sense on all 4 extremities. No evidence of extinction is noted.  Coordination: Cerebellar testing reveals good finger-nose-finger and heel-to-shin bilaterally.  Gait and station: Gait is minimally wide-based.  The patient can ambulate independently.  Tandem gait is unsteady.  Romberg is negative but is slightly unsteady.  Reflexes: Deep tendon reflexes are symmetric, but are elevated bilaterally, particularly in the arms. Toes are downgoing bilaterally.   Assessment/Plan:  1.  Orthostatic hypotension  2.  Chronic fatigue  3.  Hypertension  4.  Chronic renal insufficiency  The patient has significant fatigue and deconditioning.  He is very inactive during the day that likely worsens his orthostatic issues.  The patient likely has an autonomic neuropathy, he has an inappropriate elevation in pulse with drop in blood pressure, he is on a beta-blocker.  The patient is to try to increase his level of activity during the day, I will set him up for some home health physical therapy.  The patient will go on Mestinon taking 30 mg 3 times daily, look out for diarrhea.  He will be using an abdominal binder and the compression stockings, and he will elevate the head of the bed or use a foam wedge to elevate his head.  He is not to lay flat at any time.  He will follow up here in 4 months.  Jill Alexanders MD 05/21/2020 11:18 AM  [facility] [address] [place], [place] [address]  Phone [phone] Fax [phone]

## 2020-06-02 ENCOUNTER — Telehealth: Payer: Self-pay | Admitting: Neurology

## 2020-06-02 NOTE — Telephone Encounter (Signed)
I have the written results of the MRI of the cervical spine done through Dr. Nelva Bush at [facility].  This was done on 14 January 2020.  MRI cervical 01/14/20:  Impression:  1.  Congenitally narrowed canal with decreased laminar-pedicle angle contributes to spinal canal stenosis at multiple levels.  2.  Severe left and moderate to severe right C6-7 neuroforaminal narrowing, impinging upon exiting C7 nerve roots more pronounced on the left.  Moderate spinal canal stenosis with mild to moderate deformity of the cord.  3.  Severe right C5-6 neuroforaminal narrowing, impinging upon exiting right C6 nerve roots.  Mild spinal canal stenosis with mild deformity of the left hemicord.  4.  Moderate to severe right C4-5 neuroforaminal narrowing, impinging upon exiting right C5 nerve root.  Mild to moderate spinal canal stenosis and mild to moderate deformity of the central left spinal cord.  5.  Mild to moderate C3-4 spinal canal stenosis with mild to moderate deformity before most pronounced centrally.  Mild right neuroforaminal narrowing.  6.  Mild to moderate C2-3 spinal canal stenosis and mild deformity of the cord most pronounced centrally.  7.  Severe left and moderate to severe right C1 to articulating joint arthrosis with mild impingement upon coursing bilateral C2 nerve roots.  8.  Mild to moderate levoconvex cervical and upper thoracic scoliosis apex centered at C7.  Straightening of the normal cervical lordosis.

## 2020-06-16 DIAGNOSIS — N185 Chronic kidney disease, stage 5: Secondary | ICD-10-CM | POA: Diagnosis not present

## 2020-06-18 DIAGNOSIS — I120 Hypertensive chronic kidney disease with stage 5 chronic kidney disease or end stage renal disease: Secondary | ICD-10-CM | POA: Diagnosis not present

## 2020-06-18 DIAGNOSIS — N185 Chronic kidney disease, stage 5: Secondary | ICD-10-CM | POA: Diagnosis not present

## 2020-06-18 DIAGNOSIS — N2581 Secondary hyperparathyroidism of renal origin: Secondary | ICD-10-CM | POA: Diagnosis not present

## 2020-06-18 DIAGNOSIS — D631 Anemia in chronic kidney disease: Secondary | ICD-10-CM | POA: Diagnosis not present

## 2020-06-18 DIAGNOSIS — E872 Acidosis: Secondary | ICD-10-CM | POA: Diagnosis not present

## 2020-06-18 DIAGNOSIS — E039 Hypothyroidism, unspecified: Secondary | ICD-10-CM | POA: Diagnosis not present

## 2020-06-30 ENCOUNTER — Ambulatory Visit (INDEPENDENT_AMBULATORY_CARE_PROVIDER_SITE_OTHER): Payer: PPO | Admitting: Internal Medicine

## 2020-06-30 ENCOUNTER — Encounter: Payer: Self-pay | Admitting: Internal Medicine

## 2020-06-30 ENCOUNTER — Other Ambulatory Visit: Payer: Self-pay

## 2020-06-30 DIAGNOSIS — F332 Major depressive disorder, recurrent severe without psychotic features: Secondary | ICD-10-CM | POA: Diagnosis not present

## 2020-06-30 DIAGNOSIS — R634 Abnormal weight loss: Secondary | ICD-10-CM | POA: Diagnosis not present

## 2020-06-30 DIAGNOSIS — B079 Viral wart, unspecified: Secondary | ICD-10-CM

## 2020-06-30 DIAGNOSIS — I120 Hypertensive chronic kidney disease with stage 5 chronic kidney disease or end stage renal disease: Secondary | ICD-10-CM | POA: Diagnosis not present

## 2020-06-30 DIAGNOSIS — D631 Anemia in chronic kidney disease: Secondary | ICD-10-CM | POA: Diagnosis not present

## 2020-06-30 DIAGNOSIS — E039 Hypothyroidism, unspecified: Secondary | ICD-10-CM | POA: Diagnosis not present

## 2020-06-30 DIAGNOSIS — E875 Hyperkalemia: Secondary | ICD-10-CM | POA: Diagnosis not present

## 2020-06-30 DIAGNOSIS — N185 Chronic kidney disease, stage 5: Secondary | ICD-10-CM | POA: Diagnosis not present

## 2020-06-30 DIAGNOSIS — H9110 Presbycusis, unspecified ear: Secondary | ICD-10-CM | POA: Diagnosis not present

## 2020-06-30 DIAGNOSIS — R413 Other amnesia: Secondary | ICD-10-CM | POA: Diagnosis not present

## 2020-06-30 DIAGNOSIS — N2581 Secondary hyperparathyroidism of renal origin: Secondary | ICD-10-CM | POA: Diagnosis not present

## 2020-06-30 DIAGNOSIS — I129 Hypertensive chronic kidney disease with stage 1 through stage 4 chronic kidney disease, or unspecified chronic kidney disease: Secondary | ICD-10-CM | POA: Diagnosis not present

## 2020-06-30 DIAGNOSIS — E872 Acidosis: Secondary | ICD-10-CM | POA: Diagnosis not present

## 2020-06-30 MED ORDER — ESCITALOPRAM OXALATE 10 MG PO TABS: 10.0000 mg | ORAL_TABLET | Freq: Every day | ORAL | 5 refills | Status: DC

## 2020-06-30 NOTE — Assessment & Plan Note (Signed)
Preparing for HD soon

## 2020-06-30 NOTE — Assessment & Plan Note (Addendum)
Depression is worse Increase Lexapro to 10 mg a day if tolerated

## 2020-06-30 NOTE — Assessment & Plan Note (Signed)
Fix hearing aid

## 2020-06-30 NOTE — Progress Notes (Signed)
Subjective:  Patient ID: Kenneth Williams, male    DOB: December 31, 1935  Age: 85 y.o. MRN: [id number]  CC: Follow-up (6 month f/u)   HPI Kenneth Williams presents for ESRD, HTN, depression C/o a wart  C/o weakness, needs a walker   Outpatient Medications Prior to Visit  Medication Sig Dispense Refill  . acetaminophen (TYLENOL) 500 MG tablet Take 500 mg by mouth 2 (two) times daily as needed for mild pain.    Marland Kitchen aspirin 81 MG chewable tablet Chew 81 mg by mouth daily. Take 2 per day    . buPROPion (WELLBUTRIN XL) 300 MG 24 hr tablet Take 1 tablet (300 mg total) by mouth daily. 90 tablet 3  . calcitRIOL (ROCALTROL) 0.25 MCG capsule Take by mouth.    . cholecalciferol (VITAMIN D) 1000 UNITS tablet Take 1,000 Units by mouth daily.    . cloNIDine (CATAPRES) 0.1 MG tablet TAKE 1 TABLET (0.1 MG TOTAL) BY MOUTH 3 (THREE) TIMES DAILY AS NEEDED (TAKE FOR SYSTOLIC BP GREATER THAN 169). 270 tablet 1  . colchicine 0.6 MG tablet TAKE 1 TABLET (0.6 MG TOTAL) BY MOUTH 3 (THREE) TIMES DAILY AS NEEDED. FOR GOUT 60 tablet 2  . Cyanocobalamin (VITAMIN B 12 PO) Take 1 tablet by mouth daily.    Marland Kitchen escitalopram (LEXAPRO) 5 MG tablet TAKE 1 TABLET BY MOUTH EVERYDAY AT BEDTIME 90 tablet 3  . ezetimibe (ZETIA) 10 MG tablet TAKE 1 TABLET BY MOUTH EVERY DAY 90 tablet 3  . fluticasone (FLONASE) 50 MCG/ACT nasal spray Place 2 sprays into both nostrils daily. 16 g 6  . furosemide (LASIX) 20 MG tablet Take 20 mg by mouth daily.    . hydrALAZINE (APRESOLINE) 25 MG tablet Take 25 mg by mouth 2 (two) times daily.    Marland Kitchen labetalol (NORMODYNE) 100 MG tablet Take 100 mg by mouth 2 (two) times daily.    Marland Kitchen levothyroxine (SYNTHROID) 88 MCG tablet TAKE 1 TABLET (88 MCG TOTAL) BY MOUTH DAILY. KEEP SCHEDULED APPT NEED TSH CHECK FOR FUTURE REFILLS 90 tablet 3  . polyethylene glycol (MIRALAX / GLYCOLAX) packet Take 17 g by mouth daily as needed for mild constipation. 14 each 0  . pyridostigmine (MESTINON) 60 MG tablet Take 0.5 tablets (30 mg  total) by mouth 3 (three) times daily. 45 tablet 2  . rosuvastatin (CRESTOR) 40 MG tablet TAKE 1 TABLET BY MOUTH EVERY DAY 90 tablet 2  . sodium bicarbonate 650 MG tablet Take 650 mg by mouth 2 (two) times daily.     No facility-administered medications prior to visit.    ROS: Review of Systems  Constitutional: Positive for fatigue. Negative for appetite change and unexpected weight change.  HENT: Positive for hearing loss. Negative for congestion, nosebleeds, sneezing, sore throat and trouble swallowing.   Eyes: Negative for itching and visual disturbance.  Respiratory: Negative for cough.   Cardiovascular: Negative for chest pain, palpitations and leg swelling.  Gastrointestinal: Negative for abdominal distention, blood in stool, diarrhea and nausea.  Genitourinary: Negative for frequency and hematuria.  Musculoskeletal: Positive for arthralgias, back pain and gait problem. Negative for joint swelling and neck pain.  Skin: Negative for rash and wound.  Neurological: Positive for dizziness and weakness. Negative for tremors and speech difficulty.  Psychiatric/Behavioral: Positive for decreased concentration and dysphoric mood. Negative for agitation, sleep disturbance and suicidal ideas. The patient is nervous/anxious.     Objective:  BP (!) 168/90 (BP Location: Left Arm)   Pulse 71   Temp 98.8  F (37.1 C) (Oral)   Ht 6' 1.5" (1.867 m)   Wt 193 lb (87.5 kg)   SpO2 97%   BMI 25.12 kg/m   BP Readings from Last 3 Encounters:  06/30/20 (!) 168/90  04/15/20 (!) 183/98  02/20/20 (!) 142/78    Wt Readings from Last 3 Encounters:  06/30/20 193 lb (87.5 kg)  05/21/20 192 lb (87.1 kg)  04/15/20 187 lb 12.8 oz (85.2 kg)    Physical Exam Constitutional:      General: He is not in acute distress.    Appearance: He is well-developed. He is not toxic-appearing.     Comments: NAD  Eyes:     Conjunctiva/sclera: Conjunctivae normal.     Pupils: Pupils are equal, round, and reactive  to light.  Neck:     Thyroid: No thyromegaly.     Vascular: No JVD.  Cardiovascular:     Rate and Rhythm: Normal rate and regular rhythm.     Heart sounds: Normal heart sounds. No murmur heard. No friction rub. No gallop.   Pulmonary:     Effort: Pulmonary effort is normal. No respiratory distress.     Breath sounds: Normal breath sounds. No wheezing or rales.  Chest:     Chest wall: No tenderness.  Abdominal:     General: Bowel sounds are normal. There is no distension.     Palpations: Abdomen is soft. There is no mass.     Tenderness: There is no abdominal tenderness. There is no guarding or rebound.  Musculoskeletal:        General: Tenderness present. Normal range of motion.     Cervical back: Normal range of motion.  Lymphadenopathy:     Cervical: No cervical adenopathy.  Skin:    General: Skin is warm and dry.     Findings: No rash.  Neurological:     Mental Status: He is alert and oriented to person, place, and time.     Cranial Nerves: No cranial nerve deficit.     Motor: No abnormal muscle tone.     Coordination: Coordination abnormal.     Gait: Gait abnormal.     Deep Tendon Reflexes: Reflexes are normal and symmetric.  Psychiatric:        Behavior: Behavior normal.        Thought Content: Thought content normal.        Judgment: Judgment normal.   ataxic Hearing aid  Dry skin One papilloma/wart on the R cheek   Procedure Note :     Procedure : Cryosurgery   Indication:  Wart(s)     Risks including unsuccessful procedure , bleeding, infection, bruising, scar, a need for a repeat  procedure and others were explained to the patient in detail as well as the benefits. Informed consent was obtained verbally.   1  lesion(s)  on   R cheek  was/were treated with liquid nitrogen on a Q-tip in a usual fasion . Band-Aid was applied and antibiotic ointment was given for a later use.   Tolerated well. Complications none.   Postprocedure instructions :     Keep  the wounds clean. You can wash them with liquid soap and water. Pat dry with gauze or a Kleenex tissue  Before applying antibiotic ointment and a Band-Aid.   You need to report immediately  if  any signs of infection develop.      Lab Results  Component Value Date   WBC 9.4 09/25/2019   HGB 11.8 (  L) 09/25/2019   HCT 36.5 (L) 09/25/2019   PLT 255 09/25/2019   GLUCOSE 88 09/25/2019   CHOL 112 04/18/2019   TRIG 111 04/18/2019   HDL 47 04/18/2019   LDLDIRECT 123.0 03/25/2014   LDLCALC 45 04/18/2019   ALT 28 04/18/2019   AST 11 09/29/2018   NA 140 09/25/2019   K 4.4 09/25/2019   CL 106 09/25/2019   CREATININE 4.08 (H) 09/25/2019   BUN 41 (H) 09/25/2019   CO2 22 09/25/2019   TSH 1.93 09/25/2019   PSA 1.73 05/18/2016   INR 1.03 09/13/2017   HGBA1C 5.0 09/25/2019    VAS [place] UPPER EXTREMITY ARTERIAL DUPLEX  Result Date: 04/15/2020 UPPER EXTREMITY DUPLEX STUDY Indications: Patient complains of Evaluation prior to placement of dialysis              access.  Risk Factors: Hypertension, no history of smoking. Performing Technologist: Delorise Shiner RVT  Examination Guidelines: A complete evaluation includes B-mode imaging, spectral Doppler, color Doppler, and power Doppler as needed of all accessible portions of each vessel. Bilateral testing is considered an integral part of a complete examination. Limited examinations for reoccurring indications may be performed as noted.  Right Pre-Dialysis Findings: +-----------------------+----------+--------------------+--------+--------+ Location               PSV (cm/s)Intralum. Diam. (cm)WaveformComments +-----------------------+----------+--------------------+--------+--------+ Brachial Antecub. fossa114       0.44                biphasic         +-----------------------+----------+--------------------+--------+--------+ Radial Art at Wrist    91        0.24                biphasic          +-----------------------+----------+--------------------+--------+--------+ Ulnar Art at Wrist     109       0.22                biphasic         +-----------------------+----------+--------------------+--------+--------+  Left Pre-Dialysis Findings: +-----------------------+----------+--------------------+--------+--------+ Location               PSV (cm/s)Intralum. Diam. (cm)WaveformComments +-----------------------+----------+--------------------+--------+--------+ Brachial Antecub. fossa79        0.43                biphasic         +-----------------------+----------+--------------------+--------+--------+ Radial Art at Wrist    102       0.24                biphasic         +-----------------------+----------+--------------------+--------+--------+ Ulnar Art at Wrist     124       0.18                biphasic         +-----------------------+----------+--------------------+--------+--------+  Summary:  Right: No obstruction visualized in the right upper extremity. Left: No obstruction visualized in the left upper extremity. *See table(s) above for measurements and observations. Electronically signed by Jamelle Haring on 04/15/2020 at 5:36:10 PM.    Final    VAS [place] UPPER EXT VEIN MAPPING (PRE-OP AVF)  Result Date: 04/15/2020 UPPER EXTREMITY VEIN MAPPING  Indications: Pre-access. History: Evaluation prior to placement of dialysis access.  Performing Technologist: Delorise Shiner RVT  Examination Guidelines: A complete evaluation includes B-mode imaging, spectral Doppler, color Doppler, and power Doppler as needed of all accessible portions of each vessel. Bilateral testing is considered an integral part  of a complete examination. Limited examinations for reoccurring indications may be performed as noted. +-----------------+-------------+----------+--------+ Right Cephalic   Diameter (cm)Depth (cm)Findings +-----------------+-------------+----------+--------+ Shoulder              0.24                        +-----------------+-------------+----------+--------+ Prox upper arm       0.23                        +-----------------+-------------+----------+--------+ Mid upper arm        0.22                        +-----------------+-------------+----------+--------+ Dist upper arm       0.26                        +-----------------+-------------+----------+--------+ Antecubital fossa    0.34                        +-----------------+-------------+----------+--------+ Prox forearm         0.16                        +-----------------+-------------+----------+--------+ Mid forearm          0.17                        +-----------------+-------------+----------+--------+ Dist forearm         0.16                        +-----------------+-------------+----------+--------+ +-----------------+-------------+----------+--------+ Right Basilic    Diameter (cm)Depth (cm)Findings +-----------------+-------------+----------+--------+ Prox upper arm       0.23                        +-----------------+-------------+----------+--------+ Mid upper arm        0.18                        +-----------------+-------------+----------+--------+ Dist upper arm       0.15                        +-----------------+-------------+----------+--------+ Antecubital fossa    0.15                        +-----------------+-------------+----------+--------+ Prox forearm         0.15                        +-----------------+-------------+----------+--------+ +-----------------+-------------+----------+--------+ Left Cephalic    Diameter (cm)Depth (cm)Findings +-----------------+-------------+----------+--------+ Shoulder             0.13                        +-----------------+-------------+----------+--------+ Prox upper arm       0.15                        +-----------------+-------------+----------+--------+ Mid upper arm         0.17                        +-----------------+-------------+----------+--------+ Dist upper arm  0.20                        +-----------------+-------------+----------+--------+ Antecubital fossa    0.18                        +-----------------+-------------+----------+--------+ Prox forearm         0.15                        +-----------------+-------------+----------+--------+ Mid forearm          0.13                        +-----------------+-------------+----------+--------+ Dist forearm         0.13                        +-----------------+-------------+----------+--------+ +-----------------+-------------+----------+--------+ Left Basilic     Diameter (cm)Depth (cm)Findings +-----------------+-------------+----------+--------+ Prox upper arm       0.31                        +-----------------+-------------+----------+--------+ Mid upper arm        0.22                        +-----------------+-------------+----------+--------+ Dist upper arm       0.19                        +-----------------+-------------+----------+--------+ Antecubital fossa    0.26                        +-----------------+-------------+----------+--------+ Prox forearm         0.15                        +-----------------+-------------+----------+--------+ Summary: Right: Basilic and cephalic veins were identified with diameters as        described above. Left: Basilic and cephalic veins were identified with diameters as       described above. *See table(s) above for measurements and observations.  Diagnosing physician: Jamelle Haring Electronically signed by Jamelle Haring on 04/15/2020 at 5:05:35 PM.    Final     Assessment & Plan:    Walker Kehr, MD

## 2020-06-30 NOTE — Assessment & Plan Note (Addendum)
Planning to start HD soon AV fistula procedure is being planned  GFR was 10

## 2020-06-30 NOTE — Assessment & Plan Note (Addendum)
Wt Readings from Last 3 Encounters:  06/30/20 193 lb (87.5 kg)  05/21/20 192 lb (87.1 kg)  04/15/20 187 lb 12.8 oz (85.2 kg)   No appetite due to ESRD. GFR was 10

## 2020-06-30 NOTE — Assessment & Plan Note (Signed)
One papilloma/wart on the R cheek - cryo

## 2020-07-08 DIAGNOSIS — N185 Chronic kidney disease, stage 5: Secondary | ICD-10-CM | POA: Diagnosis not present

## 2020-07-27 ENCOUNTER — Other Ambulatory Visit: Payer: Self-pay | Admitting: Internal Medicine

## 2020-07-29 ENCOUNTER — Telehealth: Payer: Self-pay | Admitting: Internal Medicine

## 2020-07-29 NOTE — Telephone Encounter (Signed)
Daughter [name] calling to report patient has fallen. She is concerned about her father having pain. Advised caller patient may need to go to UC/ED. Declined to speak with [facility] She only wants to speak with Dr Johny Shock started to cry stating her brother has bladder cancer.  Declined appointment with other providers

## 2020-07-30 NOTE — Progress Notes (Deleted)
Subjective:    Patient ID: Kenneth Williams, male    DOB: 1935/05/28, 85 y.o.   MRN: [id number]  HPI The patient is here for an acute visit.   Fall -   Medications and allergies reviewed with patient and updated if appropriate.  Patient Active Problem List   Diagnosis Date Noted  . Sore throat 11/14/2019  . Hyperglycemia 09/25/2019  . Rhinitis 11/03/2018  . Sebaceous cyst of eyelid, left 08/31/2018  . Coronary artery disease 04/03/2018  . Blepharitis of both eyes 03/02/2018  . Enlarged prostate with urinary retention 11/04/2017  . E coli bacteremia   . Pyelonephritis   . Urinary tract infection 06/29/2017  . Rash 04/01/2017  . Orthostatic hypotension 03/03/2017  . DOE (dyspnea on exertion) 03/03/2017  . Lactose intolerance 03/03/2017  . Gout 07/21/2016  . Dyslipidemia 07/21/2016  . Hoarseness 05/18/2016  . Gynecomastia 05/18/2016  . Fatty liver 02/06/2016  . Elevated serum creatinine 09/16/2015  . Preop exam for internal medicine 09/15/2015  . Physical deconditioning 06/18/2015  . BPH (benign prostatic hyperplasia) 06/02/2015  . Chest pain, atypical 01/27/2015  . Loss of weight 10/18/2014  . Scrotal rash 10/18/2014  . Depression 07/19/2014  . Neck pain on right side 06/24/2014  . Onychomycosis of toenail 04/15/2014  . Wart of face 03/10/2014  . Carotid artery stenosis 02/29/2012  . Diarrhea 11/23/2011  . Well adult exam 05/24/2011  . Elevated PSA 05/24/2011  . Cerumen impaction 05/24/2011  . Pseudogout 10/27/2010  . Foot pain, left 09/07/2010  . Neoplasm of uncertain behavior of skin 09/07/2010  . Apathy 09/07/2010  . Edema 07/17/2010  . Acute upper respiratory infection 05/05/2010  . HEADACHE 01/28/2010  . Hearing loss 12/10/2009  . HYPERKALEMIA 10/22/2009  . ABSCESS, AXILLA 10/22/2009  . Actinic keratosis 10/22/2009  . CRF (chronic renal failure), stage 5 ([place]) 08/14/2009  . ABNORMAL CV (STRESS) TEST 03/12/2009  . Dyspnea on exertion 02/20/2009  .  Fatigue 01/09/2008  . Hypothyroidism 03/03/2007  . Memory loss 03/03/2007  . ANEMIA-NOS 10/13/2006  . Renal hypertension 10/13/2006  . TIA (transient ischemic attack) 10/13/2006  . PANCREATITIS, HX OF 10/13/2006  . Diverticulitis of colon 10/13/2006    Current Outpatient Medications on File Prior to Visit  Medication Sig Dispense Refill  . acetaminophen (TYLENOL) 500 MG tablet Take 500 mg by mouth 2 (two) times daily as needed for mild pain.    Marland Kitchen aspirin 81 MG chewable tablet Chew 81 mg by mouth daily. Take 2 per day    . buPROPion (WELLBUTRIN XL) 300 MG 24 hr tablet TAKE 1 TABLET BY MOUTH EVERY DAY 90 tablet 3  . calcitRIOL (ROCALTROL) 0.25 MCG capsule Take by mouth.    . cholecalciferol (VITAMIN D) 1000 UNITS tablet Take 1,000 Units by mouth daily.    . cloNIDine (CATAPRES) 0.1 MG tablet TAKE 1 TABLET (0.1 MG TOTAL) BY MOUTH 3 (THREE) TIMES DAILY AS NEEDED (TAKE FOR SYSTOLIC BP GREATER THAN 401). 270 tablet 1  . colchicine 0.6 MG tablet TAKE 1 TABLET (0.6 MG TOTAL) BY MOUTH 3 (THREE) TIMES DAILY AS NEEDED. FOR GOUT 60 tablet 2  . Cyanocobalamin (VITAMIN B 12 PO) Take 1 tablet by mouth daily.    Marland Kitchen escitalopram (LEXAPRO) 10 MG tablet TAKE 1 TABLET BY MOUTH EVERY DAY 90 tablet 3  . ezetimibe (ZETIA) 10 MG tablet TAKE 1 TABLET BY MOUTH EVERY DAY 90 tablet 3  . fluticasone (FLONASE) 50 MCG/ACT nasal spray Place 2 sprays into both nostrils daily.  16 g 6  . furosemide (LASIX) 20 MG tablet Take 20 mg by mouth daily.    . hydrALAZINE (APRESOLINE) 25 MG tablet Take 25 mg by mouth 2 (two) times daily.    Marland Kitchen labetalol (NORMODYNE) 100 MG tablet Take 100 mg by mouth 2 (two) times daily.    Marland Kitchen levothyroxine (SYNTHROID) 88 MCG tablet TAKE 1 TABLET (88 MCG TOTAL) BY MOUTH DAILY. KEEP SCHEDULED APPT NEED TSH CHECK FOR FUTURE REFILLS 90 tablet 3  . polyethylene glycol (MIRALAX / GLYCOLAX) packet Take 17 g by mouth daily as needed for mild constipation. 14 each 0  . pyridostigmine (MESTINON) 60 MG tablet  Take 0.5 tablets (30 mg total) by mouth 3 (three) times daily. 45 tablet 2  . rosuvastatin (CRESTOR) 40 MG tablet TAKE 1 TABLET BY MOUTH EVERY DAY 90 tablet 2  . sodium bicarbonate 650 MG tablet Take 650 mg by mouth 2 (two) times daily.     No current facility-administered medications on file prior to visit.    Past Medical History:  Diagnosis Date  . Actinic keratosis 10/22/2009   Face  4/17 neck, hand 11/20 nose  . Acute upper respiratory infection 05/05/2010   2/17     . Blepharitis of both eyes 03/02/2018   2020 eryth oint  . BPH (benign prostatic hyperplasia)   . CAD (coronary artery disease)    cath 2011-mild non obstructive, coronary artery calcium score of 727 on CT 04/13/2018  . Carotid artery stenosis 02/29/2012   2013 B 0-39% q 24 mo   . Coronary artery disease 04/03/2018   2/20 coronary calcium score is 727. It is consistent with a extensive degree of coronary atherosclerosis.  Pravastatin, ASA Cardiol - Dr [name]  . CRF (chronic renal failure), stage 5 ([place]) 08/14/2009   Chronic stage 4-5 Dr Johnney Ou The patient underwent a TURP on 11/04/2017 S/p nephrology consult 2019 SBP 130-140 at home  . Diverticulitis of colon 10/13/2006   Dr Fuller Plan Colon 2014   . DOE (dyspnea on exertion) 03/03/2017   1/19  . Dyslipidemia 07/21/2016   5/18 D/c Simvastatin due to potential drug interactions 2018 Start Lipitor 11/18Stopped Lipitor - diarrhea stopped and then came back again... Off Lipitor now 2019 Livalo - too $$$: try Pravachol 12/19 CT ca scoring option discussed  . Dyspnea    with activity  . E coli bacteremia   . Elevated PSA    history of   . Enlarged prostate with urinary retention 11/04/2017  . Gout    2009  . Gout 07/21/2016   Recurrent   . History of diverticulitis of colon   . History of pancreatitis   . History of TIAs   . HYPERKALEMIA 10/22/2009   Sporadic     . Hyperlipidemia   . Hypertension   . Hypothyroidism 03/03/2007   Chronic  On Levothroid   . Lactose intolerance  03/03/2017  . Left ear hearing loss    wears right hearing aid   without hearing aides deaf  . Memory loss 03/03/2007   2011 2016 Dr Jannifer Franklin - mild cognitive deficit 2016 Pt stopped Exelon on - recomended per Dr Jannifer Franklin' . Memory loss was attributed more to depression. 12/16 start Wellbutrin Pt needs to get hearing aids - much better after hearing was restored   . Neoplasm of uncertain behavior of skin 09/07/2010   1/16 7/20 Skin lesion on back - skin bx  . Onychomycosis of toenail 04/15/2014   Extensive 2016   . Orthostatic hypotension  03/03/2017   1/19 Reduce Labetalol down to 1 tab twice a day Drink more water  . PANCREATITIS, HX OF 10/13/2006   Qualifier: Diagnosis of  By: Doralee Albino    . Personal history of hyperthyroidism 2006   s/p 131I- Dr. Chalmers Cater, Hyperthyroid  . Pseudogout 10/27/2010   2012 NSAIDs prn - very rare Colchicine prn  . Pyelonephritis   . Renal insufficiency 2011  . Sebaceous cyst of eyelid, left 08/31/2018   7/20  . TGA (transient global amnesia)   . TIA (transient ischemic attack) 10/13/2006    2006, 2012, 03/16/14 Dr Jannifer Franklin On Aspirin; BP meds    Past Surgical History:  Procedure Laterality Date  . CHOLECYSTECTOMY    . COLONOSCOPY    . IR FLUORO GUIDE CV LINE RIGHT  09/14/2017  . IR REMOVAL TUN CV CATH W/O FL  09/20/2017  . IR US GUIDE VASC ACCESS RIGHT  09/14/2017  . right ear  surgery     BAHA bone anchored  with screw  . STAPEDES SURGERY     LEFT EAR  . TRANSURETHRAL RESECTION OF PROSTATE    . TRANSURETHRAL RESECTION OF PROSTATE N/A 11/04/2017   Procedure: TRANSURETHRAL RESECTION OF THE PROSTATE (TURP);  Surgeon: Franchot Gallo, MD;  Location: [facility];  Service: Urology;  Laterality: N/A;  1 HR    Social History   Socioeconomic History  . Marital status: Widowed    Spouse name: Not on file  . Number of children: 3  . Years of education: Not on file  . Highest education level: Not on file  Occupational History  . Not on file  Tobacco Use  . Smoking  status: Never Smoker  . Smokeless tobacco: Never Used  Vaping Use  . Vaping Use: Never used  Substance and Sexual Activity  . Alcohol use: No  . Drug use: No  . Sexual activity: Not Currently  Other Topics Concern  . Not on file  Social History Narrative   05/21/20 lives alone   Patient is right handed.   Patient drinks 1-2 cups caffeine daily.   Social Determinants of Health   Financial Resource Strain: Low Risk   . Difficulty of Paying Living Expenses: Not hard at all  Food Insecurity: Unknown  . Worried About Charity fundraiser in the Last Year: Never true  . Ran Out of Food in the Last Year: Not on file  Transportation Needs: No Transportation Needs  . Lack of Transportation (Medical): No  . Lack of Transportation (Non-Medical): No  Physical Activity: Sufficiently Active  . Days of Exercise per Week: 3 days  . Minutes of Exercise per Session: 60 min  Stress: No Stress Concern Present  . Feeling of Stress : Not at all  Social Connections: Unknown  . Frequency of Communication with Friends and Family: More than three times a week  . Frequency of Social Gatherings with Friends and Family: More than three times a week  . Attends Religious Services: Patient refused  . Active Member of Clubs or Organizations: Patient refused  . Attends Archivist Meetings: Patient refused  . Marital Status: Widowed    Family History  Problem Relation Age of Onset  . Heart disease Sister   . Dementia Mother   . Hypertension Other   . Colon cancer Neg Hx   . Stomach cancer Neg Hx   . Esophageal cancer Neg Hx   . Rectal cancer Neg Hx     Review of Systems  Objective:  There were no vitals filed for this visit. BP Readings from Last 3 Encounters:  06/30/20 (!) 168/90  04/15/20 (!) 183/98  02/20/20 (!) 142/78   Wt Readings from Last 3 Encounters:  06/30/20 193 lb (87.5 kg)  05/21/20 192 lb (87.1 kg)  04/15/20 187 lb 12.8 oz (85.2 kg)   There is no height or  weight on file to calculate BMI.   Physical Exam         Assessment & Plan:    See Problem List for Assessment and Plan of chronic medical problems.    This visit occurred during the SARS-CoV-2 public health emergency.  Safety protocols were in place, including screening questions prior to the visit, additional usage of staff PPE, and extensive cleaning of exam room while observing appropriate contact time as indicated for disinfecting solutions.

## 2020-07-31 ENCOUNTER — Ambulatory Visit: Payer: PPO | Admitting: Internal Medicine

## 2020-07-31 NOTE — Telephone Encounter (Signed)
I am sorry.  Me or one of my partners could see Kenneth Williams for an office visit to check him out.  Use Tylenol as needed pain.  Thanks

## 2020-08-01 DIAGNOSIS — H9071 Mixed conductive and sensorineural hearing loss, unilateral, right ear, with unrestricted hearing on the contralateral side: Secondary | ICD-10-CM | POA: Diagnosis not present

## 2020-08-04 DIAGNOSIS — N185 Chronic kidney disease, stage 5: Secondary | ICD-10-CM | POA: Diagnosis not present

## 2020-08-06 DIAGNOSIS — I120 Hypertensive chronic kidney disease with stage 5 chronic kidney disease or end stage renal disease: Secondary | ICD-10-CM | POA: Diagnosis not present

## 2020-08-06 DIAGNOSIS — E875 Hyperkalemia: Secondary | ICD-10-CM | POA: Diagnosis not present

## 2020-08-06 DIAGNOSIS — E872 Acidosis: Secondary | ICD-10-CM | POA: Diagnosis not present

## 2020-08-06 DIAGNOSIS — N185 Chronic kidney disease, stage 5: Secondary | ICD-10-CM | POA: Diagnosis not present

## 2020-08-06 DIAGNOSIS — D649 Anemia, unspecified: Secondary | ICD-10-CM | POA: Diagnosis not present

## 2020-08-15 ENCOUNTER — Other Ambulatory Visit: Payer: Self-pay | Admitting: Neurology

## 2020-09-18 ENCOUNTER — Ambulatory Visit (INDEPENDENT_AMBULATORY_CARE_PROVIDER_SITE_OTHER): Payer: PPO

## 2020-09-18 ENCOUNTER — Encounter: Payer: Self-pay | Admitting: Internal Medicine

## 2020-09-18 ENCOUNTER — Ambulatory Visit (INDEPENDENT_AMBULATORY_CARE_PROVIDER_SITE_OTHER): Payer: PPO | Admitting: Internal Medicine

## 2020-09-18 ENCOUNTER — Other Ambulatory Visit: Payer: Self-pay

## 2020-09-18 VITALS — BP 168/72 | HR 76 | Temp 97.7°F | Ht 73.5 in | Wt 192.0 lb

## 2020-09-18 DIAGNOSIS — R0600 Dyspnea, unspecified: Secondary | ICD-10-CM | POA: Diagnosis not present

## 2020-09-18 DIAGNOSIS — R059 Cough, unspecified: Secondary | ICD-10-CM | POA: Insufficient documentation

## 2020-09-18 DIAGNOSIS — F4321 Adjustment disorder with depressed mood: Secondary | ICD-10-CM | POA: Diagnosis not present

## 2020-09-18 DIAGNOSIS — N185 Chronic kidney disease, stage 5: Secondary | ICD-10-CM

## 2020-09-18 DIAGNOSIS — R0609 Other forms of dyspnea: Secondary | ICD-10-CM

## 2020-09-18 DIAGNOSIS — Z634 Disappearance and death of family member: Secondary | ICD-10-CM

## 2020-09-18 DIAGNOSIS — J90 Pleural effusion, not elsewhere classified: Secondary | ICD-10-CM | POA: Diagnosis not present

## 2020-09-18 MED ORDER — CEFDINIR 300 MG PO CAPS: 300.0000 mg | ORAL_CAPSULE | Freq: Two times a day (BID) | ORAL | 0 refills | Status: DC

## 2020-09-18 NOTE — Assessment & Plan Note (Signed)
F/u w/Dr Johnney Ou

## 2020-09-18 NOTE — Progress Notes (Signed)
Subjective:  Patient ID: Kenneth Williams, male    DOB: 1935/04/03  Age: 85 y.o. MRN: [id number]  CC: Follow-up   HPI Kenneth Williams presents for chronic cough x 2 months  - productive depression, hearing loss, grief  Outpatient Medications Prior to Visit  Medication Sig Dispense Refill   acetaminophen (TYLENOL) 500 MG tablet Take 500 mg by mouth 2 (two) times daily as needed for mild pain.     aspirin 81 MG chewable tablet Chew 81 mg by mouth daily. Take 2 per day     buPROPion (WELLBUTRIN XL) 300 MG 24 hr tablet TAKE 1 TABLET BY MOUTH EVERY DAY 90 tablet 3   calcitRIOL (ROCALTROL) 0.25 MCG capsule Take by mouth.     cholecalciferol (VITAMIN D) 1000 UNITS tablet Take 1,000 Units by mouth daily.     colchicine 0.6 MG tablet TAKE 1 TABLET (0.6 MG TOTAL) BY MOUTH 3 (THREE) TIMES DAILY AS NEEDED. FOR GOUT 60 tablet 2   Cyanocobalamin (VITAMIN B 12 PO) Take 1 tablet by mouth daily.     escitalopram (LEXAPRO) 10 MG tablet TAKE 1 TABLET BY MOUTH EVERY DAY 90 tablet 3   ezetimibe (ZETIA) 10 MG tablet TAKE 1 TABLET BY MOUTH EVERY DAY 90 tablet 3   fluticasone (FLONASE) 50 MCG/ACT nasal spray Place 2 sprays into both nostrils daily. 16 g 6   furosemide (LASIX) 20 MG tablet Take 20 mg by mouth daily.     hydrALAZINE (APRESOLINE) 25 MG tablet Take 25 mg by mouth 2 (two) times daily.     labetalol (NORMODYNE) 100 MG tablet Take 100 mg by mouth 2 (two) times daily.     levothyroxine (SYNTHROID) 88 MCG tablet TAKE 1 TABLET (88 MCG TOTAL) BY MOUTH DAILY. KEEP SCHEDULED APPT NEED TSH CHECK FOR FUTURE REFILLS 90 tablet 3   ondansetron (ZOFRAN-ODT) 4 MG disintegrating tablet Take 4 mg by mouth every 8 (eight) hours as needed. Take 1 by mouth every 8 hours as needed     polyethylene glycol (MIRALAX / GLYCOLAX) packet Take 17 g by mouth daily as needed for mild constipation. 14 each 0   pyridostigmine (MESTINON) 60 MG tablet TAKE 0.5 TABLETS (30 MG TOTAL) BY MOUTH 3 (THREE) TIMES DAILY. 135 tablet 1    rosuvastatin (CRESTOR) 40 MG tablet TAKE 1 TABLET BY MOUTH EVERY DAY 90 tablet 2   sodium bicarbonate 650 MG tablet Take 650 mg by mouth 2 (two) times daily.     cloNIDine (CATAPRES) 0.1 MG tablet TAKE 1 TABLET (0.1 MG TOTAL) BY MOUTH 3 (THREE) TIMES DAILY AS NEEDED (TAKE FOR SYSTOLIC BP GREATER THAN 106). 270 tablet 1   No facility-administered medications prior to visit.    ROS: Review of Systems  Constitutional:  Positive for fatigue. Negative for appetite change and unexpected weight change.  HENT:  Negative for congestion, nosebleeds, sneezing, sore throat and trouble swallowing.   Eyes:  Negative for itching and visual disturbance.  Respiratory:  Positive for cough. Negative for shortness of breath.   Cardiovascular:  Negative for chest pain, palpitations and leg swelling.  Gastrointestinal:  Negative for abdominal distention, blood in stool, diarrhea and nausea.  Genitourinary:  Negative for frequency and hematuria.  Musculoskeletal:  Positive for arthralgias, gait problem and neck pain. Negative for back pain and joint swelling.  Skin:  Negative for rash.  Neurological:  Positive for weakness. Negative for dizziness, tremors and speech difficulty.  Psychiatric/Behavioral:  Negative for agitation, dysphoric mood and sleep disturbance.  The patient is not nervous/anxious.    Objective:  BP (!) 168/72 (BP Location: Left Arm)   Pulse 76   Temp 97.7 F (36.5 C) (Oral)   Ht 6' 1.5" (1.867 m)   Wt 192 lb (87.1 kg)   SpO2 97%   BMI 24.99 kg/m   BP Readings from Last 3 Encounters:  09/18/20 (!) 168/72  06/30/20 (!) 168/90  04/15/20 (!) 183/98    Wt Readings from Last 3 Encounters:  09/18/20 192 lb (87.1 kg)  06/30/20 193 lb (87.5 kg)  05/21/20 192 lb (87.1 kg)    Physical Exam Constitutional:      General: He is not in acute distress.    Appearance: He is well-developed. He is obese.     Comments: NAD  Eyes:     Conjunctiva/sclera: Conjunctivae normal.     Pupils:  Pupils are equal, round, and reactive to light.  Neck:     Thyroid: No thyromegaly.     Vascular: No JVD.  Cardiovascular:     Rate and Rhythm: Normal rate and regular rhythm.     Heart sounds: Normal heart sounds. No murmur heard.   No friction rub. No gallop.  Pulmonary:     Effort: Pulmonary effort is normal. No respiratory distress.     Breath sounds: Normal breath sounds. No wheezing or rales.  Chest:     Chest wall: No tenderness.  Abdominal:     General: Bowel sounds are normal. There is no distension.     Palpations: Abdomen is soft. There is no mass.     Tenderness: There is no abdominal tenderness. There is no guarding or rebound.  Musculoskeletal:        General: No tenderness. Normal range of motion.     Cervical back: Normal range of motion.  Lymphadenopathy:     Cervical: No cervical adenopathy.  Skin:    General: Skin is warm and dry.     Findings: No rash.  Neurological:     Mental Status: He is alert and oriented to person, place, and time.     Cranial Nerves: No cranial nerve deficit.     Motor: Weakness present. No abnormal muscle tone.     Coordination: Coordination normal.     Gait: Gait abnormal.     Deep Tendon Reflexes: Reflexes are normal and symmetric.  Psychiatric:        Behavior: Behavior normal.        Thought Content: Thought content normal.        Judgment: Judgment normal.   In a w/c  Lab Results  Component Value Date   WBC 9.4 09/25/2019   HGB 11.8 (L) 09/25/2019   HCT 36.5 (L) 09/25/2019   PLT 255 09/25/2019   GLUCOSE 88 09/25/2019   CHOL 112 04/18/2019   TRIG 111 04/18/2019   HDL 47 04/18/2019   LDLDIRECT 123.0 03/25/2014   LDLCALC 45 04/18/2019   ALT 28 04/18/2019   AST 11 09/29/2018   NA 140 09/25/2019   K 4.4 09/25/2019   CL 106 09/25/2019   CREATININE 4.08 (H) 09/25/2019   BUN 41 (H) 09/25/2019   CO2 22 09/25/2019   TSH 1.93 09/25/2019   PSA 1.73 05/18/2016   INR 1.03 09/13/2017   HGBA1C 5.0 09/25/2019    VAS [place]  UPPER EXTREMITY ARTERIAL DUPLEX  Result Date: 04/15/2020 UPPER EXTREMITY DUPLEX STUDY Indications: Patient complains of Evaluation prior to placement of dialysis  access.  Risk Factors: Hypertension, no history of smoking. Performing Technologist: Delorise Shiner RVT  Examination Guidelines: A complete evaluation includes B-mode imaging, spectral Doppler, color Doppler, and power Doppler as needed of all accessible portions of each vessel. Bilateral testing is considered an integral part of a complete examination. Limited examinations for reoccurring indications may be performed as noted.  Right Pre-Dialysis Findings: +-----------------------+----------+--------------------+--------+--------+ Location               PSV (cm/s)Intralum. Diam. (cm)WaveformComments +-----------------------+----------+--------------------+--------+--------+ Brachial Antecub. fossa114       0.44                biphasic         +-----------------------+----------+--------------------+--------+--------+ Radial Art at Wrist    91        0.24                biphasic         +-----------------------+----------+--------------------+--------+--------+ Ulnar Art at Wrist     109       0.22                biphasic         +-----------------------+----------+--------------------+--------+--------+  Left Pre-Dialysis Findings: +-----------------------+----------+--------------------+--------+--------+ Location               PSV (cm/s)Intralum. Diam. (cm)WaveformComments +-----------------------+----------+--------------------+--------+--------+ Brachial Antecub. fossa79        0.43                biphasic         +-----------------------+----------+--------------------+--------+--------+ Radial Art at Wrist    102       0.24                biphasic         +-----------------------+----------+--------------------+--------+--------+ Ulnar Art at Wrist     124       0.18                 biphasic         +-----------------------+----------+--------------------+--------+--------+  Summary:  Right: No obstruction visualized in the right upper extremity. Left: No obstruction visualized in the left upper extremity. *See table(s) above for measurements and observations. Electronically signed by Jamelle Haring on 04/15/2020 at 5:36:10 PM.    Final    VAS [place] UPPER EXT VEIN MAPPING (PRE-OP AVF)  Result Date: 04/15/2020 UPPER EXTREMITY VEIN MAPPING  Indications: Pre-access. History: Evaluation prior to placement of dialysis access.  Performing Technologist: Delorise Shiner RVT  Examination Guidelines: A complete evaluation includes B-mode imaging, spectral Doppler, color Doppler, and power Doppler as needed of all accessible portions of each vessel. Bilateral testing is considered an integral part of a complete examination. Limited examinations for reoccurring indications may be performed as noted. +-----------------+-------------+----------+--------+ Right Cephalic   Diameter (cm)Depth (cm)Findings +-----------------+-------------+----------+--------+ Shoulder             0.24                        +-----------------+-------------+----------+--------+ Prox upper arm       0.23                        +-----------------+-------------+----------+--------+ Mid upper arm        0.22                        +-----------------+-------------+----------+--------+ Dist upper arm       0.26                        +-----------------+-------------+----------+--------+  Antecubital fossa    0.34                        +-----------------+-------------+----------+--------+ Prox forearm         0.16                        +-----------------+-------------+----------+--------+ Mid forearm          0.17                        +-----------------+-------------+----------+--------+ Dist forearm         0.16                        +-----------------+-------------+----------+--------+  +-----------------+-------------+----------+--------+ Right Basilic    Diameter (cm)Depth (cm)Findings +-----------------+-------------+----------+--------+ Prox upper arm       0.23                        +-----------------+-------------+----------+--------+ Mid upper arm        0.18                        +-----------------+-------------+----------+--------+ Dist upper arm       0.15                        +-----------------+-------------+----------+--------+ Antecubital fossa    0.15                        +-----------------+-------------+----------+--------+ Prox forearm         0.15                        +-----------------+-------------+----------+--------+ +-----------------+-------------+----------+--------+ Left Cephalic    Diameter (cm)Depth (cm)Findings +-----------------+-------------+----------+--------+ Shoulder             0.13                        +-----------------+-------------+----------+--------+ Prox upper arm       0.15                        +-----------------+-------------+----------+--------+ Mid upper arm        0.17                        +-----------------+-------------+----------+--------+ Dist upper arm       0.20                        +-----------------+-------------+----------+--------+ Antecubital fossa    0.18                        +-----------------+-------------+----------+--------+ Prox forearm         0.15                        +-----------------+-------------+----------+--------+ Mid forearm          0.13                        +-----------------+-------------+----------+--------+ Dist forearm         0.13                        +-----------------+-------------+----------+--------+ +-----------------+-------------+----------+--------+  Left Basilic     Diameter (cm)Depth (cm)Findings +-----------------+-------------+----------+--------+ Prox upper arm       0.31                         +-----------------+-------------+----------+--------+ Mid upper arm        0.22                        +-----------------+-------------+----------+--------+ Dist upper arm       0.19                        +-----------------+-------------+----------+--------+ Antecubital fossa    0.26                        +-----------------+-------------+----------+--------+ Prox forearm         0.15                        +-----------------+-------------+----------+--------+ Summary: Right: Basilic and cephalic veins were identified with diameters as        described above. Left: Basilic and cephalic veins were identified with diameters as       described above. *See table(s) above for measurements and observations.  Diagnosing physician: Jamelle Haring Electronically signed by Jamelle Haring on 04/15/2020 at 5:05:35 PM.    Final     Assessment & Plan:    Walker Kehr, MD

## 2020-09-18 NOTE — Assessment & Plan Note (Signed)
Kenneth Williams died in June 2022 of a bladder cancer

## 2020-09-18 NOTE — Assessment & Plan Note (Signed)
Obtain CXR Start Omnicef x 1o d

## 2020-09-18 NOTE — Assessment & Plan Note (Signed)
Chronic and multifactorial Move more

## 2020-09-24 ENCOUNTER — Encounter: Payer: Self-pay | Admitting: Neurology

## 2020-09-24 ENCOUNTER — Other Ambulatory Visit: Payer: Self-pay

## 2020-09-24 ENCOUNTER — Ambulatory Visit: Payer: PPO | Admitting: Neurology

## 2020-09-24 VITALS — BP 125/62 | HR 68

## 2020-09-24 DIAGNOSIS — E538 Deficiency of other specified B group vitamins: Secondary | ICD-10-CM | POA: Diagnosis not present

## 2020-09-24 DIAGNOSIS — I951 Orthostatic hypotension: Secondary | ICD-10-CM | POA: Diagnosis not present

## 2020-09-24 DIAGNOSIS — G9340 Encephalopathy, unspecified: Secondary | ICD-10-CM

## 2020-09-24 DIAGNOSIS — R269 Unspecified abnormalities of gait and mobility: Secondary | ICD-10-CM

## 2020-09-24 NOTE — Progress Notes (Signed)
Reason for visit: Orthostatic hypotension, encephalopathy, gait disorder  Kenneth Williams is an 85 y.o. male  History of present illness:  Kenneth Williams is an 85 year old right-handed white male with a history of chronic renal insufficiency, and severe orthostatic hypotension and hypertension.  The patient comes in with his daughter today.  Apparently he has had a significant change in his mental status over the last 5 days.  He has had some progressive worsening of confusion for several weeks, but within the last 5 days he has had a dramatic decline in his cognitive capabilities.  He is hallucinating, confused.  He has not been able to ambulate.  When he stands up, he has significant blood pressure drops, he fell this morning.  He will talk in his sleep at night.  He has not bumped his head or injured his head.  He has been placed on antibiotics recently for bronchitis, but the confusion started before he started the antibiotics.  He has no focal findings, he is moving the arms and legs symmetrically.  He comes here for further evaluation.  Past Medical History:  Diagnosis Date   Actinic keratosis 10/22/2009   Face  4/17 neck, hand 11/20 nose   Acute upper respiratory infection 05/05/2010   2/17      Blepharitis of both eyes 03/02/2018   2020 eryth oint   BPH (benign prostatic hyperplasia)    CAD (coronary artery disease)    cath 2011-mild non obstructive, coronary artery calcium score of 727 on CT 04/13/2018   Carotid artery stenosis 02/29/2012   2013 B 0-39% q 24 mo    Coronary artery disease 04/03/2018   2/20 coronary calcium score is 727.  It is consistent with a extensive degree of coronary atherosclerosis.  Pravastatin, ASA Cardiol - Dr [name]   CRF (chronic renal failure), stage 5 ([place]) 08/14/2009   Chronic stage 4-5 Dr Johnney Ou The patient underwent a TURP on 11/04/2017 S/p nephrology consult 2019 SBP 130-140 at home   Diverticulitis of colon 10/13/2006   Dr Fuller Plan Colon 2014    DOE (dyspnea  on exertion) 03/03/2017   1/19   Dyslipidemia 07/21/2016   5/18 D/c Simvastatin due to potential drug interactions 2018 Start Lipitor 11/18Stopped Lipitor - diarrhea stopped and then came back again... Off Lipitor now 2019 Livalo - too $$$: try Pravachol 12/19 CT ca scoring option discussed   Dyspnea    with activity   E coli bacteremia    Elevated PSA    history of    Enlarged prostate with urinary retention 11/04/2017   Gout    2009   Gout 07/21/2016   Recurrent    History of diverticulitis of colon    History of pancreatitis    History of TIAs    HYPERKALEMIA 10/22/2009   Sporadic      Hyperlipidemia    Hypertension    Hypothyroidism 03/03/2007   Chronic  On Levothroid    Lactose intolerance 03/03/2017   Left ear hearing loss    wears right hearing aid   without hearing aides deaf   Memory loss 03/03/2007   2011 2016 Dr Jannifer [facility] - mild cognitive deficit 2016 Pt stopped Exelon on - recomended per Dr Jannifer [facility]' . Memory loss was attributed more to depression. 12/16 start Wellbutrin Pt needs to get hearing aids - much better after hearing was restored    Neoplasm of uncertain behavior of skin 09/07/2010   1/16 7/20 Skin lesion on back - skin bx  Onychomycosis of toenail 04/15/2014   Extensive 2016    Orthostatic hypotension 03/03/2017   1/19 Reduce Labetalol down to 1 tab twice a day Drink more water   PANCREATITIS, HX OF 10/13/2006   Qualifier: Diagnosis of  By: Doralee Albino     Personal history of hyperthyroidism 2006   s/p 131I- Dr. Chalmers Cater, Hyperthyroid   Pseudogout 10/27/2010   2012 NSAIDs prn - very rare Colchicine prn   Pyelonephritis    Renal insufficiency 2011   Sebaceous cyst of eyelid, left 08/31/2018   7/20   TGA (transient global amnesia)    TIA (transient ischemic attack) 10/13/2006    2006, 2012, 03/16/14 Dr Jannifer [facility] On Aspirin; BP meds    Past Surgical History:  Procedure Laterality Date   CHOLECYSTECTOMY     COLONOSCOPY     IR FLUORO GUIDE CV LINE RIGHT  09/14/2017   IR  REMOVAL TUN CV CATH W/O FL  09/20/2017   IR US GUIDE VASC ACCESS RIGHT  09/14/2017   right ear  surgery     BAHA bone anchored  with screw   STAPEDES SURGERY     LEFT EAR   TRANSURETHRAL RESECTION OF PROSTATE     TRANSURETHRAL RESECTION OF PROSTATE N/A 11/04/2017   Procedure: TRANSURETHRAL RESECTION OF THE PROSTATE (TURP);  Surgeon: Franchot Gallo, MD;  Location: [facility];  Service: Urology;  Laterality: N/A;  1 HR    Family History  Problem Relation Age of Onset   Heart disease Sister    Dementia Mother    Hypertension Other    Colon cancer Neg Hx    Stomach cancer Neg Hx    Esophageal cancer Neg Hx    Rectal cancer Neg Hx     Social history:  reports that he has never smoked. He has never used smokeless tobacco. He reports that he does not drink alcohol and does not use drugs.    Allergies  Allergen Reactions   Lac Bovis     Other reaction(s): Other (See Comments) ...   Lipitor [Atorvastatin]     diarrhea   Other     Dairy products-diarrhea    Spironolactone     gynecomastia   Zocor [Simvastatin]     achy   Coreg [Carvedilol] Diarrhea    Diarrhea and bradycardia   Donepezil Hydrochloride Diarrhea    REACTION: diarrhea   Hydrochlorothiazide Other (See Comments)    REACTION: Gout   Razadyne [Galantamine Hydrobromide] Diarrhea    diarrhea    Medications:  Prior to Admission medications   Medication Sig Start Date End Date Taking? Authorizing Provider  acetaminophen (TYLENOL) 500 MG tablet Take 500 mg by mouth 2 (two) times daily as needed for mild pain.    [provider]  aspirin 81 MG chewable tablet Chew 81 mg by mouth daily. Take 2 per day    [provider]  buPROPion (WELLBUTRIN XL) 300 MG 24 hr tablet TAKE 1 TABLET BY MOUTH EVERY DAY 07/28/20   Plotnikov, Evie Lacks, MD  calcitRIOL (ROCALTROL) 0.25 MCG capsule Take by mouth. 05/05/20   [provider]  cefdinir (OMNICEF) 300 MG capsule Take 1 capsule (300 mg total) by mouth 2 (two)  times daily. 09/18/20   Plotnikov, Evie Lacks, MD  cholecalciferol (VITAMIN D) 1000 UNITS tablet Take 1,000 Units by mouth daily.    [provider]  colchicine 0.6 MG tablet TAKE 1 TABLET (0.6 MG TOTAL) BY MOUTH 3 (THREE) TIMES DAILY AS NEEDED. FOR GOUT 10/31/17  Plotnikov, Evie Lacks, MD  Cyanocobalamin (VITAMIN B 12 PO) Take 1 tablet by mouth daily.    [provider]  escitalopram (LEXAPRO) 10 MG tablet TAKE 1 TABLET BY MOUTH EVERY DAY 07/28/20   Plotnikov, Evie Lacks, MD  ezetimibe (ZETIA) 10 MG tablet TAKE 1 TABLET BY MOUTH EVERY DAY 01/14/20   Sueanne Margarita, MD  fluticasone (FLONASE) 50 MCG/ACT nasal spray Place 2 sprays into both nostrils daily. 12/12/17   Plotnikov, Evie Lacks, MD  furosemide (LASIX) 20 MG tablet Take 20 mg by mouth daily. 04/14/19   [provider]  hydrALAZINE (APRESOLINE) 25 MG tablet Take 25 mg by mouth 2 (two) times daily. 03/29/19   [provider]  labetalol (NORMODYNE) 100 MG tablet Take 100 mg by mouth 2 (two) times daily. 05/01/19   [provider]  levothyroxine (SYNTHROID) 88 MCG tablet TAKE 1 TABLET (88 MCG TOTAL) BY MOUTH DAILY. KEEP SCHEDULED APPT NEED TSH CHECK FOR FUTURE REFILLS 12/16/19   Plotnikov, Evie Lacks, MD  ondansetron (ZOFRAN-ODT) 4 MG disintegrating tablet Take 4 mg by mouth every 8 (eight) hours as needed. Take 1 by mouth every 8 hours as needed 09/03/20   [provider]  polyethylene glycol (MIRALAX / GLYCOLAX) packet Take 17 g by mouth daily as needed for mild constipation. 09/19/17   Jani Gravel, MD  pyridostigmine (MESTINON) 60 MG tablet TAKE 0.5 TABLETS (30 MG TOTAL) BY MOUTH 3 (THREE) TIMES DAILY. 08/18/20   Kathrynn Ducking, MD  rosuvastatin (CRESTOR) 40 MG tablet TAKE 1 TABLET BY MOUTH EVERY DAY 11/27/19   Sueanne Margarita, MD  sodium bicarbonate 650 MG tablet Take 650 mg by mouth 2 (two) times daily. 04/15/20   [provider]    ROS:  Out of a complete 14 system review of symptoms, the  patient complains only of the following symptoms, and all other reviewed systems are negative.  Walking difficulty Confusion Hallucinations  Blood pressure 125/62, pulse 68.  Physical Exam  General: The patient is alert and cooperative at the time of the examination.  Skin: 1+ edema below the knees is seen bilaterally.   Neurologic Exam  Mental status: The patient is alert and oriented x 3 at the time of the examination. The Mini-Mental status examination done today shows a total score of 24/30. The patient is able to name 9 four legged animals in 60 seconds.   Cranial nerves: Facial symmetry is present. Speech is normal, no aphasia or dysarthria is noted. Extraocular movements are full. Visual fields are full.  Motor: The patient has good strength in all 4 extremities.  Sensory examination: Soft touch sensation is symmetric on the face, arms, and legs.  Coordination: The patient has good finger-nose-finger and heel-to-shin bilaterally.  No asterixis is seen with arms outstretched.  Gait and station: The patient is able to stand.  Once up, the patient immediately becomes dazed and confused, he has difficulty maintaining upright posture, wants to lean backwards, he had to sit down after standing only for 30 or 40 seconds.  Reflexes: Deep tendon reflexes are symmetric.   MRI brain 12/29/2018:  IMPRESSION: This MRI of the brain without contrast shows the following: 1.   Moderate generalized cortical atrophy. 2.   Mild chronic microvascular ischemic changes involving the pons and hemispheres 3.   Minimal right mastoid effusion that could be due to eustachian tube dysfunction.   4.   There are no acute findings.   MRI cervical 01/14/20:   Impression:  1.  Congenitally narrowed canal with decreased laminar-pedicle angle contributes to spinal canal stenosis at multiple levels.   2.  Severe left and moderate to severe right C6-7 neuroforaminal narrowing, impinging upon exiting  C7 nerve roots more pronounced on the left.  Moderate spinal canal stenosis with mild to moderate deformity of the cord.   3.  Severe right C5-6 neuroforaminal narrowing, impinging upon exiting right C6 nerve roots.  Mild spinal canal stenosis with mild deformity of the left hemicord.   4.  Moderate to severe right C4-5 neuroforaminal narrowing, impinging upon exiting right C5 nerve root.  Mild to moderate spinal canal stenosis and mild to moderate deformity of the central left spinal cord.   5.  Mild to moderate C3-4 spinal canal stenosis with mild to moderate deformity before most pronounced centrally.  Mild right neuroforaminal narrowing.   6.  Mild to moderate C2-3 spinal canal stenosis and mild deformity of the cord most pronounced centrally.   7.  Severe left and moderate to severe right C1 to articulating joint arthrosis with mild impingement upon coursing bilateral C2 nerve roots.   8.  Mild to moderate levoconvex cervical and upper thoracic scoliosis apex centered at C7.  Straightening of the normal cervical lordosis.   Assessment/Plan:  1.  Severe orthostatic hypotension  2.  Encephalopathy, confusion  3.  Gait disorder  4.  Chronic renal insufficiency  The daughter indicates that the decision has been made not to place the patient on hemodialysis.  He has had a recent significant increase in confusion which could potentially be related to his underlying renal failure.  The patient will be sent for blood work today.  If the studies are unrevealing, we may consider CT of the brain.  At some point, the family may need to make a decision concerning end-of-life decisions such as initiating a hospice referral.  The patient will follow up here in 3 months, in the future he can be seen through Dr. Leta Baptist.  Jill Alexanders MD 09/24/2020 11:14 AM  [facility] [address] [place], [place] [address]  Phone [phone] Fax [phone]

## 2020-09-25 ENCOUNTER — Telehealth: Payer: Self-pay | Admitting: Neurology

## 2020-09-25 DIAGNOSIS — N185 Chronic kidney disease, stage 5: Secondary | ICD-10-CM | POA: Diagnosis not present

## 2020-09-25 DIAGNOSIS — E872 Acidosis: Secondary | ICD-10-CM | POA: Diagnosis not present

## 2020-09-25 DIAGNOSIS — D649 Anemia, unspecified: Secondary | ICD-10-CM | POA: Diagnosis not present

## 2020-09-25 DIAGNOSIS — I120 Hypertensive chronic kidney disease with stage 5 chronic kidney disease or end stage renal disease: Secondary | ICD-10-CM | POA: Diagnosis not present

## 2020-09-25 DIAGNOSIS — E875 Hyperkalemia: Secondary | ICD-10-CM | POA: Diagnosis not present

## 2020-09-25 LAB — COMPREHENSIVE METABOLIC PANEL
ALT: 57 IU/L — ABNORMAL HIGH (ref 0–44)
AST: 57 IU/L — ABNORMAL HIGH (ref 0–40)
Albumin/Globulin Ratio: 1.8 (ref 1.2–2.2)
Albumin: 3.4 g/dL — ABNORMAL LOW (ref 3.6–4.6)
Alkaline Phosphatase: 75 IU/L (ref 44–121)
BUN/Creatinine Ratio: 7 — ABNORMAL LOW (ref 10–24)
BUN: 41 mg/dL — ABNORMAL HIGH (ref 8–27)
Bilirubin Total: <0.2 mg/dL (ref 0.0–1.2)
CO2: 19 mmol/L — ABNORMAL LOW (ref 20–29)
Calcium: 8.5 mg/dL — ABNORMAL LOW (ref 8.6–10.2)
Chloride: 104 mmol/L (ref 96–106)
Creatinine, Ser: 6.29 mg/dL — ABNORMAL HIGH (ref 0.76–1.27)
Globulin, Total: 1.9 g/dL (ref 1.5–4.5)
Glucose: 90 mg/dL (ref 65–99)
Potassium: 5.9 mmol/L — ABNORMAL HIGH (ref 3.5–5.2)
Sodium: 137 mmol/L (ref 134–144)
Total Protein: 5.3 g/dL — ABNORMAL LOW (ref 6.0–8.5)
eGFR: 8 mL/min/{1.73_m2} — ABNORMAL LOW (ref >59)

## 2020-09-25 LAB — VITAMIN B12: Vitamin B-12: 1442 pg/mL — ABNORMAL HIGH (ref 232–1245)

## 2020-09-25 LAB — CBC WITH DIFFERENTIAL/PLATELET
Basophils Absolute: 0 10*3/uL (ref 0.0–0.2)
Basos: 0 %
EOS (ABSOLUTE): 0.4 10*3/uL (ref 0.0–0.4)
Eos: 3 %
Hematocrit: 28.5 % — ABNORMAL LOW (ref 37.5–51.0)
Hemoglobin: 9.1 g/dL — ABNORMAL LOW (ref 13.0–17.7)
Immature Grans (Abs): 0.1 10*3/uL (ref 0.0–0.1)
Immature Granulocytes: 1 %
Lymphocytes Absolute: 1.1 10*3/uL (ref 0.7–3.1)
Lymphs: 11 %
MCH: 29 pg (ref 26.6–33.0)
MCHC: 31.9 g/dL (ref 31.5–35.7)
MCV: 91 fL (ref 79–97)
Monocytes Absolute: 0.7 10*3/uL (ref 0.1–0.9)
Monocytes: 6 %
Neutrophils Absolute: 8.1 10*3/uL — ABNORMAL HIGH (ref 1.4–7.0)
Neutrophils: 79 %
Platelets: 232 10*3/uL (ref 150–450)
RBC: 3.14 x10E6/uL — ABNORMAL LOW (ref 4.14–5.80)
RDW: 12.2 % (ref 11.6–15.4)
WBC: 10.3 10*3/uL (ref 3.4–10.8)

## 2020-09-25 LAB — MAGNESIUM: Magnesium: 2.2 mg/dL (ref 1.6–2.3)

## 2020-09-25 LAB — AMMONIA: Ammonia: 54 ug/dL (ref 28–135)

## 2020-09-25 NOTE — Telephone Encounter (Signed)
I called the daughter.  The blood work shows significant renal failure, estimated glomerular filtration rate is less than 8.  Creatinine is greater than 6.  He will be seeing the renal doctor today, if there are no plans for any sort of dialysis, then I would recommend pursuing hospice.  I think that the deterioration of the cognitive status is related to the renal failure.

## 2020-10-09 ENCOUNTER — Telehealth: Payer: Self-pay | Admitting: Internal Medicine

## 2020-10-09 NOTE — Telephone Encounter (Signed)
Shawnie Dapper calling to request new patient appointment for Kenneth Williams 85 year old grandson  Are you able to accept him?

## 2020-10-12 NOTE — Telephone Encounter (Signed)
Okay.  Thanks.

## 2020-10-23 ENCOUNTER — Other Ambulatory Visit: Payer: Self-pay | Admitting: Internal Medicine

## 2020-10-25 DIAGNOSIS — R5381 Other malaise: Secondary | ICD-10-CM | POA: Diagnosis not present

## 2020-10-25 DIAGNOSIS — L299 Pruritus, unspecified: Secondary | ICD-10-CM | POA: Diagnosis not present

## 2020-10-25 DIAGNOSIS — R404 Transient alteration of awareness: Secondary | ICD-10-CM | POA: Diagnosis not present

## 2020-11-03 ENCOUNTER — Telehealth: Payer: Self-pay | Admitting: Internal Medicine

## 2020-11-03 MED ORDER — CEFDINIR 300 MG PO CAPS: 300.0000 mg | ORAL_CAPSULE | Freq: Two times a day (BID) | ORAL | 0 refills | Status: AC

## 2020-11-03 NOTE — Telephone Encounter (Signed)
I will renew Kenneth Williams.  Office visit if not better.  Okay to schedule [name] with me.  Thanks

## 2020-11-03 NOTE — Telephone Encounter (Signed)
Patient daughter calling in  Patient was prescribed cefdinir (OMNICEF) 300 MG capsule on 07.28.22  Daughter says she does not feel antibiotic was enough bc patient still not feeling well & is to weak to come in for OV  Wants to know if provider can send another rx for antibiotic to pharmacy for patient: [id number] - [place], [place] - [address]  Phone:  [phone] Fax:  [phone]  Daughter also says provider gave okay to schedule son Burrell Hodapp as new patient & wants to know if still okay so she can schedule appt for him  Please call [phone]

## 2020-11-04 NOTE — Telephone Encounter (Signed)
Notified pt daughter [name]) w/MD response.Marland KitchenJohny Chess

## 2020-11-09 ENCOUNTER — Emergency Department (HOSPITAL_COMMUNITY)
Admission: EM | Admit: 2020-11-09 | Discharge: 2020-11-10 | Disposition: A | Discharge status: Home / Self Care | Attending: Emergency Medicine | Admitting: Emergency Medicine

## 2020-11-09 ENCOUNTER — Emergency Department (HOSPITAL_COMMUNITY)

## 2020-11-09 DIAGNOSIS — I120 Hypertensive chronic kidney disease with stage 5 chronic kidney disease or end stage renal disease: Secondary | ICD-10-CM | POA: Insufficient documentation

## 2020-11-09 DIAGNOSIS — I2510 Atherosclerotic heart disease of native coronary artery without angina pectoris: Secondary | ICD-10-CM | POA: Diagnosis not present

## 2020-11-09 DIAGNOSIS — I517 Cardiomegaly: Secondary | ICD-10-CM | POA: Diagnosis not present

## 2020-11-09 DIAGNOSIS — Z992 Dependence on renal dialysis: Secondary | ICD-10-CM | POA: Insufficient documentation

## 2020-11-09 DIAGNOSIS — E039 Hypothyroidism, unspecified: Secondary | ICD-10-CM | POA: Diagnosis not present

## 2020-11-09 DIAGNOSIS — W1839XA Other fall on same level, initial encounter: Secondary | ICD-10-CM | POA: Insufficient documentation

## 2020-11-09 DIAGNOSIS — S0990XA Unspecified injury of head, initial encounter: Secondary | ICD-10-CM | POA: Insufficient documentation

## 2020-11-09 DIAGNOSIS — E875 Hyperkalemia: Secondary | ICD-10-CM | POA: Diagnosis not present

## 2020-11-09 DIAGNOSIS — Z79899 Other long term (current) drug therapy: Secondary | ICD-10-CM | POA: Insufficient documentation

## 2020-11-09 DIAGNOSIS — Z5320 Procedure and treatment not carried out because of patient's decision for unspecified reasons: Secondary | ICD-10-CM | POA: Diagnosis not present

## 2020-11-09 DIAGNOSIS — Z515 Encounter for palliative care: Secondary | ICD-10-CM | POA: Insufficient documentation

## 2020-11-09 DIAGNOSIS — A419 Sepsis, unspecified organism: Secondary | ICD-10-CM | POA: Diagnosis not present

## 2020-11-09 DIAGNOSIS — R55 Syncope and collapse: Secondary | ICD-10-CM | POA: Diagnosis not present

## 2020-11-09 DIAGNOSIS — Z20822 Contact with and (suspected) exposure to covid-19: Secondary | ICD-10-CM | POA: Insufficient documentation

## 2020-11-09 DIAGNOSIS — R404 Transient alteration of awareness: Secondary | ICD-10-CM | POA: Diagnosis not present

## 2020-11-09 DIAGNOSIS — Z85828 Personal history of other malignant neoplasm of skin: Secondary | ICD-10-CM | POA: Insufficient documentation

## 2020-11-09 DIAGNOSIS — R9431 Abnormal electrocardiogram [ECG] [EKG]: Secondary | ICD-10-CM | POA: Diagnosis not present

## 2020-11-09 DIAGNOSIS — N186 End stage renal disease: Secondary | ICD-10-CM | POA: Diagnosis not present

## 2020-11-09 DIAGNOSIS — R0902 Hypoxemia: Secondary | ICD-10-CM | POA: Diagnosis not present

## 2020-11-09 DIAGNOSIS — R059 Cough, unspecified: Secondary | ICD-10-CM | POA: Diagnosis not present

## 2020-11-09 DIAGNOSIS — R231 Pallor: Secondary | ICD-10-CM | POA: Diagnosis not present

## 2020-11-09 DIAGNOSIS — Z7982 Long term (current) use of aspirin: Secondary | ICD-10-CM | POA: Insufficient documentation

## 2020-11-09 DIAGNOSIS — R0689 Other abnormalities of breathing: Secondary | ICD-10-CM | POA: Diagnosis not present

## 2020-11-09 DIAGNOSIS — E876 Hypokalemia: Secondary | ICD-10-CM | POA: Insufficient documentation

## 2020-11-09 LAB — CBC WITH DIFFERENTIAL/PLATELET
Abs Immature Granulocytes: 0.18 10*3/uL — ABNORMAL HIGH (ref 0.00–0.07)
Basophils Absolute: 0.1 10*3/uL (ref 0.0–0.1)
Basophils Relative: 1 %
Eosinophils Absolute: 0.9 10*3/uL — ABNORMAL HIGH (ref 0.0–0.5)
Eosinophils Relative: 8 %
HCT: 31 % — ABNORMAL LOW (ref 39.0–52.0)
Hemoglobin: 9.6 g/dL — ABNORMAL LOW (ref 13.0–17.0)
Immature Granulocytes: 2 %
Lymphocytes Relative: 13 %
Lymphs Abs: 1.4 10*3/uL (ref 0.7–4.0)
MCH: 28.9 pg (ref 26.0–34.0)
MCHC: 31 g/dL (ref 30.0–36.0)
MCV: 93.4 fL (ref 80.0–100.0)
Monocytes Absolute: 0.8 10*3/uL (ref 0.1–1.0)
Monocytes Relative: 8 %
Neutro Abs: 7.2 10*3/uL (ref 1.7–7.7)
Neutrophils Relative %: 68 %
Platelets: 406 10*3/uL — ABNORMAL HIGH (ref 150–400)
RBC: 3.32 MIL/uL — ABNORMAL LOW (ref 4.22–5.81)
RDW: 12.8 % (ref 11.5–15.5)
WBC: 10.5 10*3/uL (ref 4.0–10.5)
nRBC: 0 % (ref 0.0–0.2)

## 2020-11-09 LAB — COMPREHENSIVE METABOLIC PANEL
ALT: 12 U/L (ref 0–44)
AST: 9 U/L — ABNORMAL LOW (ref 15–41)
Albumin: 2.4 g/dL — ABNORMAL LOW (ref 3.5–5.0)
Alkaline Phosphatase: 67 U/L (ref 38–126)
Anion gap: 9 (ref 5–15)
BUN: 62 mg/dL — ABNORMAL HIGH (ref 8–23)
CO2: 21 mmol/L — ABNORMAL LOW (ref 22–32)
Calcium: 8.9 mg/dL (ref 8.9–10.3)
Chloride: 108 mmol/L (ref 98–111)
Creatinine, Ser: 7.32 mg/dL — ABNORMAL HIGH (ref 0.61–1.24)
GFR, Estimated: 7 mL/min — ABNORMAL LOW (ref >60)
Glucose, Bld: 102 mg/dL — ABNORMAL HIGH (ref 70–99)
Potassium: 6.4 mmol/L (ref 3.5–5.1)
Sodium: 138 mmol/L (ref 135–145)
Total Bilirubin: 0.5 mg/dL (ref 0.3–1.2)
Total Protein: 6.2 g/dL — ABNORMAL LOW (ref 6.5–8.1)

## 2020-11-09 LAB — CBG MONITORING, ED: Glucose-Capillary: 88 mg/dL (ref 70–99)

## 2020-11-09 LAB — LACTIC ACID, PLASMA: Lactic Acid, Venous: 1.4 mmol/L (ref 0.5–1.9)

## 2020-11-09 LAB — RESP PANEL BY RT-PCR (FLU A&B, COVID) ARPGX2
Influenza A by PCR: NEGATIVE
Influenza B by PCR: NEGATIVE
SARS Coronavirus 2 by RT PCR: NEGATIVE

## 2020-11-09 MED ORDER — DEXTROSE 10 % IV SOLN: Freq: Once | INTRAVENOUS | Status: AC

## 2020-11-09 MED ORDER — SODIUM CHLORIDE 0.9 % IV SOLN: 1.0000 g | INTRAVENOUS | Status: DC

## 2020-11-09 MED ORDER — FUROSEMIDE 10 MG/ML IJ SOLN
40.0000 mg | Freq: Once | INTRAMUSCULAR | Status: AC
  Administered 2020-11-09: 40 mg via INTRAVENOUS
  Filled 2020-11-09: qty 4

## 2020-11-09 MED ORDER — VANCOMYCIN HCL 1750 MG/350ML IV SOLN
1750.0000 mg | Freq: Once | INTRAVENOUS | Status: AC
  Administered 2020-11-09: 1750 mg via INTRAVENOUS
  Filled 2020-11-09: qty 350

## 2020-11-09 MED ORDER — CEPHALEXIN 500 MG PO CAPS: 500.0000 mg | ORAL_CAPSULE | Freq: Two times a day (BID) | ORAL | 0 refills | Status: AC

## 2020-11-09 MED ORDER — INSULIN ASPART 100 UNIT/ML IV SOLN
10.0000 [IU] | Freq: Once | INTRAVENOUS | Status: AC
  Administered 2020-11-09: 5 [IU] via INTRAVENOUS

## 2020-11-09 MED ORDER — SODIUM CHLORIDE 0.9 % IV SOLN
2.0000 g | Freq: Once | INTRAVENOUS | Status: AC
  Administered 2020-11-09: 2 g via INTRAVENOUS
  Filled 2020-11-09: qty 2

## 2020-11-09 MED ORDER — VANCOMYCIN VARIABLE DOSE PER UNSTABLE RENAL FUNCTION (PHARMACIST DOSING): Status: DC

## 2020-11-09 MED ORDER — SODIUM ZIRCONIUM CYCLOSILICATE 10 G PO PACK
10.0000 g | PACK | Freq: Once | ORAL | Status: AC
  Administered 2020-11-09: 10 g via ORAL
  Filled 2020-11-09: qty 1

## 2020-11-09 NOTE — ED Notes (Signed)
Date and time results received: 11/09/20 1945  Test: K+ Critical Value: 6.4  Name of Provider Notified: Dr. Pearline Cables  Orders Received? Or Actions Taken?: N/A

## 2020-11-09 NOTE — ED Provider Notes (Addendum)
[facility] EMERGENCY DEPARTMENT Provider Note   CSN: [id number] Arrival date & time: 11/09/20  1639     History Chief Complaint  Patient presents with   Aspiration    Kenneth Williams is a 85 y.o. male.  Pt is a 85 yo male, with ESRD not on dialysis, hospice, presenting for fall. Patient had witnessed fall by daughter today with blunt head trauma. Hx provided by daughter at this time due to patient's current mental status.  Admits to cough over the past two weeks and dx of bronchitis by pcp and started on unknown antibiotic for pneumonia prophylaxis two days ago. Admits to subjective fevers and chills. Admits to worsening confusion and AMS.   The history is provided by the patient. No language interpreter was used.      Past Medical History:  Diagnosis Date   Actinic keratosis 10/22/2009   Face  4/17 neck, hand 11/20 nose   Acute upper respiratory infection 05/05/2010   2/17      Blepharitis of both eyes 03/02/2018   2020 eryth oint   BPH (benign prostatic hyperplasia)    CAD (coronary artery disease)    cath 2011-mild non obstructive, coronary artery calcium score of 727 on CT 04/13/2018   Carotid artery stenosis 02/29/2012   2013 B 0-39% q 24 mo    Coronary artery disease 04/03/2018   2/20 coronary calcium score is 727.  It is consistent with a extensive degree of coronary atherosclerosis.  Pravastatin, ASA Cardiol - Dr [name]   CRF (chronic renal failure), stage 5 ([place]) 08/14/2009   Chronic stage 4-5 Dr Johnney Ou The patient underwent a TURP on 11/04/2017 S/p nephrology consult 2019 SBP 130-140 at home   Diverticulitis of colon 10/13/2006   Dr Fuller Plan Colon 2014    DOE (dyspnea on exertion) 03/03/2017   1/19   Dyslipidemia 07/21/2016   5/18 D/c Simvastatin due to potential drug interactions 2018 Start Lipitor 11/18Stopped Lipitor - diarrhea stopped and then came back again... Off Lipitor now 2019 Livalo - too $$$: try Pravachol 12/19 CT ca scoring option discussed    Dyspnea    with activity   E coli bacteremia    Elevated PSA    history of    Enlarged prostate with urinary retention 11/04/2017   Gout    2009   Gout 07/21/2016   Recurrent    History of diverticulitis of colon    History of pancreatitis    History of TIAs    HYPERKALEMIA 10/22/2009   Sporadic      Hyperlipidemia    Hypertension    Hypothyroidism 03/03/2007   Chronic  On Levothroid    Lactose intolerance 03/03/2017   Left ear hearing loss    wears right hearing aid   without hearing aides deaf   Memory loss 03/03/2007   2011 2016 Dr Jannifer Franklin - mild cognitive deficit 2016 Pt stopped Exelon on - recomended per Dr Jannifer Franklin' . Memory loss was attributed more to depression. 12/16 start Wellbutrin Pt needs to get hearing aids - much better after hearing was restored    Neoplasm of uncertain behavior of skin 09/07/2010   1/16 7/20 Skin lesion on back - skin bx   Onychomycosis of toenail 04/15/2014   Extensive 2016    Orthostatic hypotension 03/03/2017   1/19 Reduce Labetalol down to 1 tab twice a day Drink more water   PANCREATITIS, HX OF 10/13/2006   Qualifier: Diagnosis of  By: Doralee Albino  Personal history of hyperthyroidism 2006   s/p 131I- Dr. Chalmers Cater, Hyperthyroid   Pseudogout 10/27/2010   2012 NSAIDs prn - very rare Colchicine prn   Pyelonephritis    Renal insufficiency 2011   Sebaceous cyst of eyelid, left 08/31/2018   7/20   TGA (transient global amnesia)    TIA (transient ischemic attack) 10/13/2006    2006, 2012, 03/16/14 Dr Jannifer Franklin On Aspirin; BP meds    Patient Active Problem List   Diagnosis Date Noted   Cough 09/18/2020   Grief at loss of child 09/18/2020   Sore throat 11/14/2019   Hyperglycemia 09/25/2019   Rhinitis 11/03/2018   Sebaceous cyst of eyelid, left 08/31/2018   Coronary artery disease 04/03/2018   Blepharitis of both eyes 03/02/2018   Enlarged prostate with urinary retention 11/04/2017   E coli bacteremia    Pyelonephritis    Urinary tract infection  06/29/2017   Rash 04/01/2017   Orthostatic hypotension 03/03/2017   DOE (dyspnea on exertion) 03/03/2017   Lactose intolerance 03/03/2017   Gout 07/21/2016   Dyslipidemia 07/21/2016   Hoarseness 05/18/2016   Gynecomastia 05/18/2016   Fatty liver 02/06/2016   Elevated serum creatinine 09/16/2015   Preop exam for internal medicine 09/15/2015   Physical deconditioning 06/18/2015   BPH (benign prostatic hyperplasia) 06/02/2015   Chest pain, atypical 01/27/2015   Loss of weight 10/18/2014   Scrotal rash 10/18/2014   Depression 07/19/2014   Neck pain on right side 06/24/2014   Onychomycosis of toenail 04/15/2014   Wart of face 03/10/2014   Carotid artery stenosis 02/29/2012   Diarrhea 11/23/2011   Well adult exam 05/24/2011   Elevated PSA 05/24/2011   Cerumen impaction 05/24/2011   Pseudogout 10/27/2010   Foot pain, left 09/07/2010   Neoplasm of uncertain behavior of skin 09/07/2010   Apathy 09/07/2010   Edema 07/17/2010   Acute upper respiratory infection 05/05/2010   HEADACHE 01/28/2010   Hearing loss 12/10/2009   HYPERKALEMIA 10/22/2009   ABSCESS, AXILLA 10/22/2009   Actinic keratosis 10/22/2009   CRF (chronic renal failure), stage 5 ([place]) 08/14/2009   ABNORMAL CV (STRESS) TEST 03/12/2009   Dyspnea on exertion 02/20/2009   Fatigue 01/09/2008   Hypothyroidism 03/03/2007   Memory loss 03/03/2007   ANEMIA-NOS 10/13/2006   Renal hypertension 10/13/2006   TIA (transient ischemic attack) 10/13/2006   PANCREATITIS, HX OF 10/13/2006   Diverticulitis of colon 10/13/2006    Past Surgical History:  Procedure Laterality Date   CHOLECYSTECTOMY     COLONOSCOPY     IR FLUORO GUIDE CV LINE RIGHT  09/14/2017   IR REMOVAL TUN CV CATH W/O FL  09/20/2017   IR US GUIDE VASC ACCESS RIGHT  09/14/2017   right ear  surgery     BAHA bone anchored  with screw   STAPEDES SURGERY     LEFT EAR   TRANSURETHRAL RESECTION OF PROSTATE     TRANSURETHRAL RESECTION OF PROSTATE N/A 11/04/2017    Procedure: TRANSURETHRAL RESECTION OF THE PROSTATE (TURP);  Surgeon: Franchot Gallo, MD;  Location: [facility];  Service: Urology;  Laterality: N/A;  1 HR       Family History  Problem Relation Age of Onset   Heart disease Sister    Dementia Mother    Hypertension Other    Colon cancer Neg Hx    Stomach cancer Neg Hx    Esophageal cancer Neg Hx    Rectal cancer Neg Hx     Social History   Tobacco Use  Smoking status: Never   Smokeless tobacco: Never  Vaping Use   Vaping Use: Never used  Substance Use Topics   Alcohol use: No   Drug use: No    Home Medications Prior to Admission medications   Medication Sig Start Date End Date Taking? Authorizing Provider  acetaminophen (TYLENOL) 500 MG tablet Take 500 mg by mouth 2 (two) times daily as needed for mild pain.    [provider]  aspirin 81 MG chewable tablet Chew 81 mg by mouth daily. Take 2 per day    [provider]  buPROPion (WELLBUTRIN XL) 300 MG 24 hr tablet TAKE 1 TABLET BY MOUTH EVERY DAY Patient taking differently: Take 300 mg by mouth daily. 07/28/20   Plotnikov, Evie Lacks, MD  calcitRIOL (ROCALTROL) 0.25 MCG capsule Take 0.25 mcg by mouth 3 (three) times a week. 05/05/20   [provider]  cefdinir (OMNICEF) 300 MG capsule Take 1 capsule (300 mg total) by mouth 2 (two) times daily. 11/03/20   Plotnikov, Evie Lacks, MD  cholecalciferol (VITAMIN D) 1000 UNITS tablet Take 1,000 Units by mouth daily.    [provider]  colchicine 0.6 MG tablet TAKE 1 TABLET (0.6 MG TOTAL) BY MOUTH 3 (THREE) TIMES DAILY AS NEEDED. FOR GOUT 10/31/17   Plotnikov, Evie Lacks, MD  Cyanocobalamin (VITAMIN B 12 PO) Take 1 tablet by mouth daily.    [provider]  escitalopram (LEXAPRO) 5 MG tablet Take 5 mg by mouth at bedtime. 09/15/20   [provider]  ezetimibe (ZETIA) 10 MG tablet TAKE 1 TABLET BY MOUTH EVERY DAY Patient taking differently: Take 10 mg by mouth daily. 01/14/20   Sueanne Margarita, MD  fluticasone (FLONASE) 50 MCG/ACT nasal spray Place 2 sprays into both nostrils daily. 12/12/17   Plotnikov, Evie Lacks, MD  furosemide (LASIX) 20 MG tablet Take 20 mg by mouth daily. 04/14/19   [provider]  hydrALAZINE (APRESOLINE) 25 MG tablet Take 25 mg by mouth 2 (two) times daily. 03/29/19   [provider]  labetalol (NORMODYNE) 100 MG tablet Take 100 mg by mouth 2 (two) times daily. 05/01/19   [provider]  levothyroxine (SYNTHROID) 88 MCG tablet TAKE 1 TABLET (88 MCG TOTAL) BY MOUTH DAILY. KEEP SCHEDULED APPT NEED TSH CHECK FOR FUTURE REFILLS 12/16/19   Plotnikov, Evie Lacks, MD  ondansetron (ZOFRAN-ODT) 4 MG disintegrating tablet Take 4 mg by mouth every 8 (eight) hours as needed for vomiting or nausea. Take 1 by mouth every 8 hours as needed 09/03/20   [provider]  polyethylene glycol (MIRALAX / GLYCOLAX) packet Take 17 g by mouth daily as needed for mild constipation. 09/19/17   Jani Gravel, MD  rosuvastatin (CRESTOR) 40 MG tablet TAKE 1 TABLET BY MOUTH EVERY DAY Patient taking differently: Take 40 mg by mouth daily. 10/23/20   Plotnikov, Evie Lacks, MD  sodium bicarbonate 650 MG tablet Take 650 mg by mouth 2 (two) times daily. 04/15/20   [provider]    Allergies    Lac bovis, Lipitor [atorvastatin], Other, Spironolactone, Zocor [simvastatin], Coreg [carvedilol], Donepezil hydrochloride, Hydrochlorothiazide, and Razadyne [galantamine hydrobromide]  Review of Systems   Review of Systems  Unable to perform ROS: Mental status change   Physical Exam Updated Vital Signs BP (!) 142/66   Pulse 75   Temp (!) 96.1 F (35.6 C) (Rectal)   Resp (!) 21   Ht 6' 1.5" (1.867 m)   Wt 87.1 kg   SpO2 95%  BMI 24.99 kg/m   Physical Exam Vitals and nursing note reviewed.  Constitutional:      Appearance: He is well-developed. He is ill-appearing.     Comments: hypothermic  HENT:     Head: Normocephalic and atraumatic.  Eyes:      Conjunctiva/sclera: Conjunctivae normal.  Cardiovascular:     Rate and Rhythm: Normal rate and regular rhythm.     Heart sounds: No murmur heard. Pulmonary:     Effort: Pulmonary effort is normal. No respiratory distress.     Breath sounds: Normal breath sounds.  Abdominal:     Palpations: Abdomen is soft.     Tenderness: There is no abdominal tenderness.  Musculoskeletal:     Cervical back: Neck supple.  Skin:    General: Skin is warm and dry.  Neurological:     Mental Status: He is alert.     GCS: GCS eye subscore is 2. GCS verbal subscore is 4. GCS motor subscore is 4.    ED Results / Procedures / Treatments   Labs (all labs ordered are listed, but only abnormal results are displayed) Labs Reviewed  COMPREHENSIVE METABOLIC PANEL - Abnormal; Notable for the following components:      Result Value   Potassium 6.4 (*)    CO2 21 (*)    Glucose, Bld 102 (*)    BUN 62 (*)    Creatinine, Ser 7.32 (*)    Total Protein 6.2 (*)    Albumin 2.4 (*)    AST 9 (*)    GFR, Estimated 7 (*)    All other components within normal limits  CBC WITH DIFFERENTIAL/PLATELET - Abnormal; Notable for the following components:   RBC 3.32 (*)    Hemoglobin 9.6 (*)    HCT 31.0 (*)    Platelets 406 (*)    Eosinophils Absolute 0.9 (*)    Abs Immature Granulocytes 0.18 (*)    All other components within normal limits  CULTURE, BLOOD (ROUTINE X 2)  RESP PANEL BY RT-PCR (FLU A&B, COVID) ARPGX2  SARS CORONAVIRUS 2 (TAT 6-24 HRS)  CULTURE, BLOOD (ROUTINE X 2)  LACTIC ACID, PLASMA  URINALYSIS, ROUTINE W REFLEX MICROSCOPIC    EKG None  Radiology DG Chest Portable 1 View  Result Date: 11/09/2020 CLINICAL DATA:  Cough. EXAM: PORTABLE CHEST 1 VIEW COMPARISON:  Chest x-ray 09/18/2020. FINDINGS: The heart is enlarged. There is no pleural effusion or pneumothorax. There is some minimal patchy retrocardiac opacities. There is no pneumothorax. No acute fractures are seen. IMPRESSION: 1. Minimal  retrocardiac atelectasis/airspace disease. 2. Stable cardiomegaly. Electronically Signed   By: Ronney Asters M.D.   On: 11/09/2020 17:48    Procedures Procedures   Medications Ordered in ED Medications  vancomycin (VANCOREADY) IVPB 1750 mg/350 mL (1,750 mg Intravenous New Bag/Given 11/09/20 1938)  ceFEPIme (MAXIPIME) 1 g in sodium chloride 0.9 % 100 mL IVPB (has no administration in time range)  vancomycin variable dose per unstable renal function (pharmacist dosing) (has no administration in time range)  ceFEPIme (MAXIPIME) 2 g in sodium chloride 0.9 % 100 mL IVPB (0 g Intravenous Stopped 11/09/20 1903)    ED Course  I have reviewed the triage vital signs and the nursing notes.  Pertinent labs & imaging results that were available during my care of the patient were reviewed by me and considered in my medical decision making (see chart for details).    MDM Rules/Calculators/A&P  6:00 PM 85 yo male, with ESRD not on dialysis, hospice, presenting for fall. Patient had witnessed fall by daughter today with blunt head trauma. Patient is hypothermic, tachypnic, GCS 10.    -CT head and neck for fall declined by family -Bear hugger applied for hypothermia. Pt triggering for sepsis due to vitals. Blood cx and LA sent. Vanc and cefepime started for concerns for sepsis.  8:21 PM -No leukocytosis.  -CXR demonstrates no focal pneumonia.  -UA pending -Potassium elevated 6.0, as to be expected with ESRD-no dialysis on hospice. Hyperkalemia protocol ordered as requested by family including insulin, dextrose, lasix, and Lokelma.   Family request to be discharged after medications complete with antibiotics to be sent to pharmacy if urine positive. Pt signed out to on coming provider while awaiting treatment for hyperkalemia.       Final Clinical Impression(s) / ED Diagnoses Final diagnoses:  Sepsis, due to unspecified organism, unspecified whether acute organ  dysfunction present [facility])  ESRD (end stage renal disease) ([place])  Hyperkalemia  Hospice care    Rx / DC Orders ED Discharge Orders     None        Lianne Cure, DO 09/81/19 1478    Campbell Stall P, DO 29/56/21 1350

## 2020-11-09 NOTE — ED Triage Notes (Signed)
Pt bib [facility] from home. Pt had a syncopal episode and was found hypotensive. Pt vomited and aspirated. Pt was initially pale, diaphortic upon arrival pt is responsive and takling. EMS placed c-collar. EMS gave 100cc fluid till BP stabilized. Pt has bronchitis and is currently taking abx.  Pt has ESRD, is not receiving dialysis. Pt is in hospice care. Pt has hx of orthostatic hypotension.   EMS Vitals  90 RA 60 HR 120/76 BP CBG 124

## 2020-11-09 NOTE — Progress Notes (Signed)
Pharmacy Antibiotic Note  Kenneth Williams is a 85 y.o. male admitted on 11/09/2020 with sepsis.  Pharmacy has been consulted for Cefepime and vancomycin dosing.  WBC wnl, LA 1.4, SCr elevated at 7.32 (BL ~ 2.34 in 2019)   Plan: -Cefepime 1 gm IV Q 24 hours  -Vancomycin 1750 mg IV load followed by dosing per levels -Monitor CBC, renal fx, cultures and clinical progress  Height: 6' 1.5" (186.7 cm) Weight: 87.1 kg (192 lb 0.3 oz) IBW/kg (Calculated) : 81.05  Temp (24hrs), Avg:95.4 F (35.2 C), Min:95.4 F (35.2 C), Max:95.4 F (35.2 C)  Recent Labs  Lab 11/09/20 1725  WBC 10.5  LATICACIDVEN 1.4    CrCl cannot be calculated (Patient's most recent lab result is older than the maximum 21 days allowed.).    Allergies  Allergen Reactions   Lac Bovis     Other reaction(s): Other (See Comments) ...   Lipitor [Atorvastatin]     diarrhea   Other     Dairy products-diarrhea    Spironolactone     gynecomastia   Zocor [Simvastatin]     achy   Coreg [Carvedilol] Diarrhea    Diarrhea and bradycardia   Donepezil Hydrochloride Diarrhea    REACTION: diarrhea   Hydrochlorothiazide Other (See Comments)    REACTION: Gout   Razadyne [Galantamine Hydrobromide] Diarrhea    diarrhea    Antimicrobials this admission: Cefepime 9/18 >>  Vancomycin 9/18 >>   Dose adjustments this admission:   Microbiology results: 9/18 BCx:    Thank you for allowing pharmacy to be a part of this patient's care.  Albertina Parr, PharmD., BCPS, BCCCP Clinical Pharmacist Please refer to [facility] for unit-specific pharmacist

## 2020-11-09 NOTE — ED Notes (Signed)
Called [facility] to transport patient to [address], [place] [address]

## 2020-11-09 NOTE — ED Notes (Signed)
Placed pt on bair hugger for rectal temp of 95.4

## 2020-11-09 NOTE — ED Provider Notes (Signed)
Signout from Dr. Pearline Cables.  He 85 year old male end-stage renal disease not on dialysis but on hospice.  He has had decline in condition.  Concern for possible aspiration or infection.  Plan is to follow-up on results of urinalysis and awaiting medications for his hyperkalemia.  Family does not want admission to the hospital.  Ultimately will be discharged, plus minus antibiotics for possible urine infection Physical Exam  BP (!) 163/65   Pulse 80   Temp (!) 96.1 F (35.6 C) (Rectal)   Resp 20   Ht 6' 1.5" (1.867 m)   Wt 87.1 kg   SpO2 96%   BMI 24.99 kg/m   Physical Exam  ED Course/Procedures     Procedures  MDM  Nurse was unable to secure the urine.  I had a discussion with the daughter and she is comfortable taking him home now.  He was received his medications for his hyperkalemia.  She understands these are temporizing measures.  Offered to provide a prescription for antibiotics if she was concerned that he might have a UTI.  Recommended that hospice can also obtain a urine.       Hayden Rasmussen, MD 11/10/20 [phone]

## 2020-11-09 NOTE — ED Notes (Signed)
Vancomycin not started per Dr. Pearline Cables until family determine hospice status

## 2020-11-10 NOTE — ED Notes (Signed)
Called to verify that patient was still on the [facility] list, they state that she is 3rd on the list.

## 2020-11-17 LAB — CULTURE, BLOOD (ROUTINE X 2)
Culture: NO GROWTH
Culture: NO GROWTH
Special Requests: ADEQUATE
Special Requests: ADEQUATE

## 2020-11-21 ENCOUNTER — Ambulatory Visit: Payer: PPO | Admitting: Cardiology

## 2020-11-24 ENCOUNTER — Other Ambulatory Visit: Payer: Self-pay | Admitting: Internal Medicine

## 2020-12-21 ENCOUNTER — Emergency Department (HOSPITAL_COMMUNITY)

## 2020-12-21 ENCOUNTER — Other Ambulatory Visit: Payer: Self-pay

## 2020-12-21 ENCOUNTER — Encounter (HOSPITAL_COMMUNITY): Payer: Self-pay | Admitting: *Deleted

## 2020-12-21 ENCOUNTER — Emergency Department (HOSPITAL_COMMUNITY)
Admission: EM | Admit: 2020-12-21 | Discharge: 2020-12-21 | Disposition: A | Discharge status: Home / Self Care | Attending: Emergency Medicine | Admitting: Emergency Medicine

## 2020-12-21 DIAGNOSIS — R531 Weakness: Secondary | ICD-10-CM | POA: Insufficient documentation

## 2020-12-21 DIAGNOSIS — N289 Disorder of kidney and ureter, unspecified: Secondary | ICD-10-CM | POA: Insufficient documentation

## 2020-12-21 DIAGNOSIS — A419 Sepsis, unspecified organism: Secondary | ICD-10-CM | POA: Diagnosis not present

## 2020-12-21 DIAGNOSIS — I129 Hypertensive chronic kidney disease with stage 1 through stage 4 chronic kidney disease, or unspecified chronic kidney disease: Secondary | ICD-10-CM | POA: Diagnosis not present

## 2020-12-21 DIAGNOSIS — N185 Chronic kidney disease, stage 5: Secondary | ICD-10-CM | POA: Insufficient documentation

## 2020-12-21 DIAGNOSIS — Z7982 Long term (current) use of aspirin: Secondary | ICD-10-CM | POA: Insufficient documentation

## 2020-12-21 DIAGNOSIS — Z85828 Personal history of other malignant neoplasm of skin: Secondary | ICD-10-CM | POA: Insufficient documentation

## 2020-12-21 DIAGNOSIS — I2510 Atherosclerotic heart disease of native coronary artery without angina pectoris: Secondary | ICD-10-CM | POA: Diagnosis not present

## 2020-12-21 DIAGNOSIS — R5383 Other fatigue: Secondary | ICD-10-CM | POA: Insufficient documentation

## 2020-12-21 DIAGNOSIS — E039 Hypothyroidism, unspecified: Secondary | ICD-10-CM | POA: Diagnosis not present

## 2020-12-21 DIAGNOSIS — R4182 Altered mental status, unspecified: Secondary | ICD-10-CM | POA: Diagnosis not present

## 2020-12-21 DIAGNOSIS — N189 Chronic kidney disease, unspecified: Secondary | ICD-10-CM

## 2020-12-21 DIAGNOSIS — R160 Hepatomegaly, not elsewhere classified: Secondary | ICD-10-CM | POA: Insufficient documentation

## 2020-12-21 DIAGNOSIS — I120 Hypertensive chronic kidney disease with stage 5 chronic kidney disease or end stage renal disease: Secondary | ICD-10-CM | POA: Diagnosis not present

## 2020-12-21 DIAGNOSIS — Z79899 Other long term (current) drug therapy: Secondary | ICD-10-CM | POA: Insufficient documentation

## 2020-12-21 LAB — COMPREHENSIVE METABOLIC PANEL
ALT: 13 U/L (ref 0–44)
AST: 16 U/L (ref 15–41)
Albumin: 2.7 g/dL — ABNORMAL LOW (ref 3.5–5.0)
Alkaline Phosphatase: 46 U/L (ref 38–126)
Anion gap: 11 (ref 5–15)
BUN: 77 mg/dL — ABNORMAL HIGH (ref 8–23)
CO2: 10 mmol/L — ABNORMAL LOW (ref 22–32)
Calcium: 8.2 mg/dL — ABNORMAL LOW (ref 8.9–10.3)
Chloride: 119 mmol/L — ABNORMAL HIGH (ref 98–111)
Creatinine, Ser: 9.19 mg/dL — ABNORMAL HIGH (ref 0.61–1.24)
GFR, Estimated: 5 mL/min — ABNORMAL LOW (ref >60)
Glucose, Bld: 101 mg/dL — ABNORMAL HIGH (ref 70–99)
Potassium: 5.1 mmol/L (ref 3.5–5.1)
Sodium: 140 mmol/L (ref 135–145)
Total Bilirubin: 0.4 mg/dL (ref 0.3–1.2)
Total Protein: 5.8 g/dL — ABNORMAL LOW (ref 6.5–8.1)

## 2020-12-21 LAB — AMMONIA: Ammonia: 30 umol/L (ref 9–35)

## 2020-12-21 LAB — CBC WITH DIFFERENTIAL/PLATELET
Abs Immature Granulocytes: 0.04 10*3/uL (ref 0.00–0.07)
Basophils Absolute: 0 10*3/uL (ref 0.0–0.1)
Basophils Relative: 0 %
Eosinophils Absolute: 0.1 10*3/uL (ref 0.0–0.5)
Eosinophils Relative: 1 %
HCT: 30 % — ABNORMAL LOW (ref 39.0–52.0)
Hemoglobin: 9.1 g/dL — ABNORMAL LOW (ref 13.0–17.0)
Immature Granulocytes: 1 %
Lymphocytes Relative: 15 %
Lymphs Abs: 1.3 10*3/uL (ref 0.7–4.0)
MCH: 28.4 pg (ref 26.0–34.0)
MCHC: 30.3 g/dL (ref 30.0–36.0)
MCV: 93.8 fL (ref 80.0–100.0)
Monocytes Absolute: 1 10*3/uL (ref 0.1–1.0)
Monocytes Relative: 12 %
Neutro Abs: 6.3 10*3/uL (ref 1.7–7.7)
Neutrophils Relative %: 71 %
Platelets: 203 10*3/uL (ref 150–400)
RBC: 3.2 MIL/uL — ABNORMAL LOW (ref 4.22–5.81)
RDW: 13.1 % (ref 11.5–15.5)
WBC: 8.7 10*3/uL (ref 4.0–10.5)
nRBC: 0 % (ref 0.0–0.2)

## 2020-12-21 LAB — APTT: aPTT: 41 seconds — ABNORMAL HIGH (ref 24–36)

## 2020-12-21 LAB — PROTIME-INR
INR: 1.1 (ref 0.8–1.2)
Prothrombin Time: 14.2 seconds (ref 11.4–15.2)

## 2020-12-21 LAB — LACTIC ACID, PLASMA: Lactic Acid, Venous: 0.8 mmol/L (ref 0.5–1.9)

## 2020-12-21 MED ORDER — LACTATED RINGERS IV BOLUS
1000.0000 mL | Freq: Once | INTRAVENOUS | Status: AC
  Administered 2020-12-21: 1000 mL via INTRAVENOUS

## 2020-12-21 NOTE — ED Triage Notes (Signed)
Pt arrived with EMS from home with AMS per family and feeling bad for the past couple pf days. Denies pain. Hx of CKD; family enroute with DNR. Initial BP with EMS 80/0 , pulse 66, cbg 90

## 2020-12-21 NOTE — ED Provider Notes (Signed)
[facility] EMERGENCY DEPARTMENT Provider Note   CSN: [id number] Arrival date & time: 12/21/20  0129     History Chief Complaint  Patient presents with   Altered Mental Status    Kenneth Williams is a 85 y.o. male.  85 year old male with a history of coronary artery disease, chronic renal failure stage V and other medical problems documented below on hospice the presents the emergency department today secondary to altered mental status and lethargy.  Patient states that he feels fine but EMS states that the wife told him that the patient has been more weak than normal and felt hot at home. On EMS arrival, had BP of 63'O systolic, fluids initiated.    Altered Mental Status     Past Medical History:  Diagnosis Date   Actinic keratosis 10/22/2009   Face  4/17 neck, hand 11/20 nose   Acute upper respiratory infection 05/05/2010   2/17      Blepharitis of both eyes 03/02/2018   2020 eryth oint   BPH (benign prostatic hyperplasia)    CAD (coronary artery disease)    cath 2011-mild non obstructive, coronary artery calcium score of 727 on CT 04/13/2018   Carotid artery stenosis 02/29/2012   2013 B 0-39% q 24 mo    Coronary artery disease 04/03/2018   2/20 coronary calcium score is 727.  It is consistent with a extensive degree of coronary atherosclerosis.  Pravastatin, ASA Cardiol - Dr [name]   CRF (chronic renal failure), stage 5 ([place]) 08/14/2009   Chronic stage 4-5 Dr Johnney Ou The patient underwent a TURP on 11/04/2017 S/p nephrology consult 2019 SBP 130-140 at home   Diverticulitis of colon 10/13/2006   Dr Fuller Plan Colon 2014    DOE (dyspnea on exertion) 03/03/2017   1/19   Dyslipidemia 07/21/2016   5/18 D/c Simvastatin due to potential drug interactions 2018 Start Lipitor 11/18Stopped Lipitor - diarrhea stopped and then came back again... Off Lipitor now 2019 Livalo - too $$$: try Pravachol 12/19 CT ca scoring option discussed   Dyspnea    with activity   E coli bacteremia     Elevated PSA    history of    Enlarged prostate with urinary retention 11/04/2017   Gout    2009   Gout 07/21/2016   Recurrent    History of diverticulitis of colon    History of pancreatitis    History of TIAs    HYPERKALEMIA 10/22/2009   Sporadic      Hyperlipidemia    Hypertension    Hypothyroidism 03/03/2007   Chronic  On Levothroid    Lactose intolerance 03/03/2017   Left ear hearing loss    wears right hearing aid   without hearing aides deaf   Memory loss 03/03/2007   2011 2016 Dr Jannifer Franklin - mild cognitive deficit 2016 Pt stopped Exelon on - recomended per Dr Jannifer Franklin' . Memory loss was attributed more to depression. 12/16 start Wellbutrin Pt needs to get hearing aids - much better after hearing was restored    Neoplasm of uncertain behavior of skin 09/07/2010   1/16 7/20 Skin lesion on back - skin bx   Onychomycosis of toenail 04/15/2014   Extensive 2016    Orthostatic hypotension 03/03/2017   1/19 Reduce Labetalol down to 1 tab twice a day Drink more water   PANCREATITIS, HX OF 10/13/2006   Qualifier: Diagnosis of  By: Doralee Albino     Personal history of hyperthyroidism 2006   s/p 131I-  Dr. Chalmers Cater, Hyperthyroid   Pseudogout 10/27/2010   2012 NSAIDs prn - very rare Colchicine prn   Pyelonephritis    Renal insufficiency 2011   Sebaceous cyst of eyelid, left 08/31/2018   7/20   TGA (transient global amnesia)    TIA (transient ischemic attack) 10/13/2006    2006, 2012, 03/16/14 Dr Jannifer Franklin On Aspirin; BP meds    Patient Active Problem List   Diagnosis Date Noted   Cough 09/18/2020   Grief at loss of child 09/18/2020   Sore throat 11/14/2019   Hyperglycemia 09/25/2019   Rhinitis 11/03/2018   Sebaceous cyst of eyelid, left 08/31/2018   Coronary artery disease 04/03/2018   Blepharitis of both eyes 03/02/2018   Enlarged prostate with urinary retention 11/04/2017   E coli bacteremia    Pyelonephritis    Urinary tract infection 06/29/2017   Rash 04/01/2017   Orthostatic  hypotension 03/03/2017   DOE (dyspnea on exertion) 03/03/2017   Lactose intolerance 03/03/2017   Gout 07/21/2016   Dyslipidemia 07/21/2016   Hoarseness 05/18/2016   Gynecomastia 05/18/2016   Fatty liver 02/06/2016   Elevated serum creatinine 09/16/2015   Preop exam for internal medicine 09/15/2015   Physical deconditioning 06/18/2015   BPH (benign prostatic hyperplasia) 06/02/2015   Chest pain, atypical 01/27/2015   Loss of weight 10/18/2014   Scrotal rash 10/18/2014   Depression 07/19/2014   Neck pain on right side 06/24/2014   Onychomycosis of toenail 04/15/2014   Wart of face 03/10/2014   Carotid artery stenosis 02/29/2012   Diarrhea 11/23/2011   Well adult exam 05/24/2011   Elevated PSA 05/24/2011   Cerumen impaction 05/24/2011   Pseudogout 10/27/2010   Foot pain, left 09/07/2010   Neoplasm of uncertain behavior of skin 09/07/2010   Apathy 09/07/2010   Edema 07/17/2010   Acute upper respiratory infection 05/05/2010   HEADACHE 01/28/2010   Hearing loss 12/10/2009   HYPERKALEMIA 10/22/2009   ABSCESS, AXILLA 10/22/2009   Actinic keratosis 10/22/2009   CRF (chronic renal failure), stage 5 ([place]) 08/14/2009   ABNORMAL CV (STRESS) TEST 03/12/2009   Dyspnea on exertion 02/20/2009   Fatigue 01/09/2008   Hypothyroidism 03/03/2007   Memory loss 03/03/2007   ANEMIA-NOS 10/13/2006   Renal hypertension 10/13/2006   TIA (transient ischemic attack) 10/13/2006   PANCREATITIS, HX OF 10/13/2006   Diverticulitis of colon 10/13/2006    Past Surgical History:  Procedure Laterality Date   CHOLECYSTECTOMY     COLONOSCOPY     IR FLUORO GUIDE CV LINE RIGHT  09/14/2017   IR REMOVAL TUN CV CATH W/O FL  09/20/2017   IR US GUIDE VASC ACCESS RIGHT  09/14/2017   right ear  surgery     BAHA bone anchored  with screw   STAPEDES SURGERY     LEFT EAR   TRANSURETHRAL RESECTION OF PROSTATE     TRANSURETHRAL RESECTION OF PROSTATE N/A 11/04/2017   Procedure: TRANSURETHRAL RESECTION OF THE  PROSTATE (TURP);  Surgeon: Franchot Gallo, MD;  Location: [facility];  Service: Urology;  Laterality: N/A;  1 HR       Family History  Problem Relation Age of Onset   Heart disease Sister    Dementia Mother    Hypertension Other    Colon cancer Neg Hx    Stomach cancer Neg Hx    Esophageal cancer Neg Hx    Rectal cancer Neg Hx     Social History   Tobacco Use   Smoking status: Never   Smokeless tobacco: Never  Vaping Use   Vaping Use: Never used  Substance Use Topics   Alcohol use: No   Drug use: No    Home Medications Prior to Admission medications   Medication Sig Start Date End Date Taking? Authorizing Provider  acetaminophen (TYLENOL) 500 MG tablet Take 500 mg by mouth 2 (two) times daily as needed for mild pain.   Yes [provider]  aspirin 81 MG chewable tablet Chew 162 mg by mouth daily.   Yes [provider]  furosemide (LASIX) 20 MG tablet Take 20 mg by mouth daily. 04/14/19  Yes [provider]  hydrALAZINE (APRESOLINE) 25 MG tablet Take 25 mg by mouth 2 (two) times daily. 03/29/19  Yes [provider]  labetalol (NORMODYNE) 100 MG tablet Take 100 mg by mouth 2 (two) times daily. 05/01/19  Yes [provider]  levothyroxine (SYNTHROID) 88 MCG tablet TAKE 1 TABLET (88 MCG TOTAL) BY MOUTH DAILY. KEEP SCHEDULED APPT NEED TSH CHECK FOR FUTURE REFILLS Patient taking differently: Take 88 mcg by mouth daily. 11/25/20  Yes Plotnikov, Evie Lacks, MD  ondansetron (ZOFRAN-ODT) 4 MG disintegrating tablet Take 4 mg by mouth every 8 (eight) hours as needed for vomiting or nausea. Take 1 by mouth every 8 hours as needed 09/03/20  Yes [provider]  buPROPion (WELLBUTRIN XL) 300 MG 24 hr tablet TAKE 1 TABLET BY MOUTH EVERY DAY Patient not taking: No sig reported 07/28/20   Plotnikov, Evie Lacks, MD  cefdinir (OMNICEF) 300 MG capsule Take 1 capsule (300 mg total) by mouth 2 (two) times daily. Patient not taking: No sig reported 11/03/20    Plotnikov, Evie Lacks, MD  cephALEXin (KEFLEX) 500 MG capsule Take 1 capsule (500 mg total) by mouth 2 (two) times daily. Patient not taking: No sig reported 11/09/20   Hayden Rasmussen, MD  colchicine 0.6 MG tablet TAKE 1 TABLET (0.6 MG TOTAL) BY MOUTH 3 (THREE) TIMES DAILY AS NEEDED. FOR GOUT Patient not taking: No sig reported 10/31/17   Plotnikov, Evie Lacks, MD  ezetimibe (ZETIA) 10 MG tablet TAKE 1 TABLET BY MOUTH EVERY DAY Patient not taking: No sig reported 01/14/20   Sueanne Margarita, MD  fluticasone (FLONASE) 50 MCG/ACT nasal spray Place 2 sprays into both nostrils daily. Patient not taking: No sig reported 12/12/17   Plotnikov, Evie Lacks, MD  polyethylene glycol (MIRALAX / GLYCOLAX) packet Take 17 g by mouth daily as needed for mild constipation. Patient not taking: No sig reported 09/19/17   Jani Gravel, MD  rosuvastatin (CRESTOR) 40 MG tablet TAKE 1 TABLET BY MOUTH EVERY DAY Patient not taking: No sig reported 10/23/20   Plotnikov, Evie Lacks, MD    Allergies    Lac bovis, Lipitor [atorvastatin], Other, Spironolactone, Zocor [simvastatin], Coreg [carvedilol], Donepezil hydrochloride, Hydrochlorothiazide, and Razadyne [galantamine hydrobromide]  Review of Systems   Review of Systems  All other systems reviewed and are negative.  Physical Exam Updated Vital Signs BP (!) 166/82   Pulse 75   Temp 98.6 F (37 C) (Oral)   Resp 16   SpO2 97%   Physical Exam Vitals and nursing note reviewed.  Constitutional:      Appearance: He is well-developed.  HENT:     Head: Normocephalic and atraumatic.     Mouth/Throat:     Mouth: Mucous membranes are moist.     Pharynx: Oropharynx is clear.  Eyes:     Pupils: Pupils are equal, round, and reactive to light.  Cardiovascular:  Rate and Rhythm: Normal rate.  Pulmonary:     Effort: Pulmonary effort is normal. No respiratory distress.  Abdominal:     General: Abdomen is flat. There is no distension.     Palpations: There is  hepatomegaly. There is no shifting dullness, fluid wave or splenomegaly.  Musculoskeletal:        General: Normal range of motion.     Cervical back: Normal range of motion.  Skin:    General: Skin is warm and dry.  Neurological:     General: No focal deficit present.     Mental Status: He is alert.    ED Results / Procedures / Treatments   Labs (all labs ordered are listed, but only abnormal results are displayed) Labs Reviewed  COMPREHENSIVE METABOLIC PANEL - Abnormal; Notable for the following components:      Result Value   Chloride 119 (*)    CO2 10 (*)    Glucose, Bld 101 (*)    BUN 77 (*)    Creatinine, Ser 9.19 (*)    Calcium 8.2 (*)    Total Protein 5.8 (*)    Albumin 2.7 (*)    GFR, Estimated 5 (*)    All other components within normal limits  CBC WITH DIFFERENTIAL/PLATELET - Abnormal; Notable for the following components:   RBC 3.20 (*)    Hemoglobin 9.1 (*)    HCT 30.0 (*)    All other components within normal limits  APTT - Abnormal; Notable for the following components:   aPTT 41 (*)    All other components within normal limits  CULTURE, BLOOD (SINGLE)  URINE CULTURE  LACTIC ACID, PLASMA  PROTIME-INR  AMMONIA    EKG None  Radiology DG Chest Port 1 View  Result Date: 12/21/2020 CLINICAL DATA:  Sepsis EXAM: PORTABLE CHEST 1 VIEW COMPARISON:  11/09/2020 FINDINGS: Mild left basilar scarring, chronic. Right lung is clear. No pleural effusion or pneumothorax. The heart is normal in size.  Thoracic aortic atherosclerosis. IMPRESSION: No evidence of acute cardiopulmonary disease. Electronically Signed   By: Julian Hy M.D.   On: 12/21/2020 02:13    Procedures Procedures   Medications Ordered in ED Medications  lactated ringers bolus 1,000 mL (0 mLs Intravenous Stopped 12/21/20 0705)    ED Course  I have reviewed the triage vital signs and the nursing notes.  Pertinent labs & imaging results that were available during my care of the patient  were reviewed by me and considered in my medical decision making (see chart for details).    MDM Rules/Calculators/A&P                         Will eval for possible infection vs other causes. Will obtain collateral from family.   Daughter arrived and confirmed the patient is DNR and on hospice.  I discussed with her about what to do she states that she felt like he was close to being back to normal.  I discussed his worsening kidney function and that he is likely to do well.  She specifically asked if I thought she he would live till Christmas and we talked about how he had a chance of living until Thanksgiving but with the rapidity of his kidney function worsening and not want to do dialysis there is no way to know how long he would be able to survive this.  She stated she understood this and did not want him hospitalized and still did  not want hemodialysis.  We did give him some fluids which I hope help with his kidney function a little bit.  I suspect that his BUN will continue to rise he will continue to become more altered.  I discussed these thoughts with his daughter and she understood.  Will be discharged to their care.  Final Clinical Impression(s) / ED Diagnoses Final diagnoses:  Weakness  Acute on chronic renal insufficiency    Rx / DC Orders ED Discharge Orders     None        Etana Beets, Corene Cornea, MD 12/22/20 0151

## 2020-12-23 ENCOUNTER — Telehealth: Payer: Self-pay | Admitting: Internal Medicine

## 2020-12-23 ENCOUNTER — Ambulatory Visit: Payer: PPO | Admitting: Internal Medicine

## 2020-12-23 NOTE — Telephone Encounter (Signed)
Noted.  I am sorry.  Thanks

## 2020-12-23 NOTE — Telephone Encounter (Signed)
Daughter [name] calling in  Wants to let provider know that patient is currently in hospice

## 2020-12-26 ENCOUNTER — Telehealth: Payer: Self-pay | Admitting: Internal Medicine

## 2020-12-26 LAB — CULTURE, BLOOD (SINGLE)
Culture: NO GROWTH
Special Requests: ADEQUATE

## 2020-12-26 NOTE — Telephone Encounter (Signed)
Called patients daughter to schedule Medicare Annual Wellness Visit, she asked that I call back because she was busy at the moment.  Will call her back at a later time.  Last AWV  09/25/19  Please schedule at anytime with [facility] if patient calls the office back.    40 Minutes appointment [place]  Any questions, please call me at [phone]

## 2020-12-29 ENCOUNTER — Ambulatory Visit: Payer: PPO | Admitting: Diagnostic Neuroimaging

## 2021-01-22 DEATH — deceased
